# Patient Record
Sex: Male | Born: 1950
Health system: Southern US, Community
[De-identification: ages and names within clinical notes are randomized; demographics above are authoritative.]

## PROBLEM LIST (undated history)

## (undated) DIAGNOSIS — D320 Benign neoplasm of cerebral meninges: Secondary | ICD-10-CM

## (undated) DIAGNOSIS — I10 Essential (primary) hypertension: Secondary | ICD-10-CM

## (undated) DIAGNOSIS — E119 Type 2 diabetes mellitus without complications: Secondary | ICD-10-CM

## (undated) DIAGNOSIS — I639 Cerebral infarction, unspecified: Secondary | ICD-10-CM

## (undated) DIAGNOSIS — H409 Unspecified glaucoma: Secondary | ICD-10-CM

## (undated) DIAGNOSIS — E785 Hyperlipidemia, unspecified: Secondary | ICD-10-CM

## (undated) HISTORY — PX: EYE SURGERY: SHX253

---

## 2003-10-29 ENCOUNTER — Ambulatory Visit: Payer: Self-pay | Admitting: *Deleted

## 2004-01-26 ENCOUNTER — Ambulatory Visit: Payer: Self-pay | Admitting: Family Medicine

## 2004-04-12 ENCOUNTER — Ambulatory Visit: Payer: Self-pay | Admitting: Nurse Practitioner

## 2004-05-03 ENCOUNTER — Ambulatory Visit: Payer: Self-pay | Admitting: Nurse Practitioner

## 2004-06-21 ENCOUNTER — Ambulatory Visit: Payer: Self-pay | Admitting: Nurse Practitioner

## 2004-08-09 ENCOUNTER — Ambulatory Visit: Payer: Self-pay | Admitting: Nurse Practitioner

## 2004-09-13 ENCOUNTER — Ambulatory Visit: Payer: Self-pay | Admitting: Nurse Practitioner

## 2005-05-02 ENCOUNTER — Ambulatory Visit: Payer: Self-pay | Admitting: Nurse Practitioner

## 2005-09-05 ENCOUNTER — Encounter (INDEPENDENT_AMBULATORY_CARE_PROVIDER_SITE_OTHER): Payer: Self-pay | Admitting: Internal Medicine

## 2005-09-05 ENCOUNTER — Ambulatory Visit: Payer: Self-pay | Admitting: Internal Medicine

## 2005-09-05 LAB — CONVERTED CEMR LAB
ALT: 28 units/L
AST: 25 units/L
Albumin: 4.2 g/dL
Alkaline Phosphatase: 107 units/L
Anion Gap: 9
BUN: 16 mg/dL
CO2: 27 meq/L
Calcium: 9.3 mg/dL
Chloride: 106 meq/L
Creatinine, Ser: 1.1 mg/dL
Glucose, Bld: 113 mg/dL
Potassium: 3.8 meq/L
Sodium: 142 meq/L
Total Bilirubin: 0.4 mg/dL
Total Protein: 7.2 g/dL

## 2006-02-26 ENCOUNTER — Encounter (INDEPENDENT_AMBULATORY_CARE_PROVIDER_SITE_OTHER): Payer: Self-pay | Admitting: Internal Medicine

## 2006-02-26 DIAGNOSIS — H409 Unspecified glaucoma: Secondary | ICD-10-CM | POA: Insufficient documentation

## 2006-02-26 DIAGNOSIS — E785 Hyperlipidemia, unspecified: Secondary | ICD-10-CM | POA: Insufficient documentation

## 2006-02-26 DIAGNOSIS — I10 Essential (primary) hypertension: Secondary | ICD-10-CM | POA: Insufficient documentation

## 2011-05-18 ENCOUNTER — Emergency Department (HOSPITAL_COMMUNITY): Payer: No Typology Code available for payment source

## 2011-05-18 ENCOUNTER — Emergency Department (HOSPITAL_COMMUNITY)
Admission: EM | Admit: 2011-05-18 | Discharge: 2011-05-18 | Disposition: A | Discharge status: Home / Self Care | Payer: No Typology Code available for payment source | Attending: Emergency Medicine | Admitting: Emergency Medicine

## 2011-05-18 ENCOUNTER — Encounter (HOSPITAL_COMMUNITY): Payer: Self-pay | Admitting: Emergency Medicine

## 2011-05-18 DIAGNOSIS — S335XXA Sprain of ligaments of lumbar spine, initial encounter: Secondary | ICD-10-CM | POA: Insufficient documentation

## 2011-05-18 DIAGNOSIS — S161XXA Strain of muscle, fascia and tendon at neck level, initial encounter: Secondary | ICD-10-CM

## 2011-05-18 DIAGNOSIS — S39012A Strain of muscle, fascia and tendon of lower back, initial encounter: Secondary | ICD-10-CM

## 2011-05-18 DIAGNOSIS — E785 Hyperlipidemia, unspecified: Secondary | ICD-10-CM | POA: Insufficient documentation

## 2011-05-18 DIAGNOSIS — S139XXA Sprain of joints and ligaments of unspecified parts of neck, initial encounter: Secondary | ICD-10-CM | POA: Insufficient documentation

## 2011-05-18 DIAGNOSIS — M545 Low back pain, unspecified: Secondary | ICD-10-CM | POA: Insufficient documentation

## 2011-05-18 DIAGNOSIS — I10 Essential (primary) hypertension: Secondary | ICD-10-CM | POA: Insufficient documentation

## 2011-05-18 DIAGNOSIS — M542 Cervicalgia: Secondary | ICD-10-CM | POA: Insufficient documentation

## 2011-05-18 HISTORY — DX: Essential (primary) hypertension: I10

## 2011-05-18 HISTORY — DX: Hyperlipidemia, unspecified: E78.5

## 2011-05-18 MED ORDER — OXYCODONE-ACETAMINOPHEN 5-325 MG PO TABS
1.0000 | ORAL_TABLET | Freq: Once | ORAL | Status: AC
  Administered 2011-05-18: 1 via ORAL
  Filled 2011-05-18: qty 1

## 2011-05-18 MED ORDER — OXYCODONE-ACETAMINOPHEN 5-325 MG PO TABS: 1.0000 | ORAL_TABLET | ORAL | Status: AC | PRN

## 2011-05-18 MED ORDER — CYCLOBENZAPRINE HCL 10 MG PO TABS: 10.0000 mg | ORAL_TABLET | Freq: Two times a day (BID) | ORAL | Status: AC | PRN

## 2011-05-18 NOTE — ED Notes (Signed)
Patient transported to X-ray 
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
## 2011-05-18 NOTE — ED Provider Notes (Signed)
Medical screening examination/treatment/procedure(s) were performed by non-physician practitioner and as supervising physician I was immediately available for consultation/collaboration.  Jasper Riling. Alvino Chapel, MD 05/18/11 [phone]
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
## 2011-05-18 NOTE — Discharge Instructions (Signed)
Cervical Sprain A cervical sprain is an injury in the neck in which the ligaments are stretched or torn. The ligaments are the tissues that hold the bones of the neck (vertebrae) in place.Cervical sprains can range from very mild to very severe. Most cervical sprains get better in 1 to 3 weeks, but it depends on the cause and extent of the injury. Severe cervical sprains can cause the neck vertebrae to be unstable. This can lead to damage of the spinal cord and can result in serious nervous system problems. Your caregiver will determine whether your cervical sprain is mild or severe. CAUSES  Severe cervical sprains may be caused by:  Contact sport injuries (football, rugby, wrestling, hockey, auto racing, gymnastics, diving, martial arts, boxing).   Motor vehicle collisions.   Whiplash injuries. This means the neck is forcefully whipped backward and forward.   Falls.  Mild cervical sprains may be caused by:   Awkward positions, such as cradling a telephone between your ear and shoulder.   Sitting in a chair that does not offer proper support.   Working at a poorly Landscape architect station.   Activities that require looking up or down for long periods of time.  SYMPTOMS   Pain, soreness, stiffness, or a burning sensation in the front, back, or sides of the neck. This discomfort may develop immediately after injury or it may develop slowly and not begin for 24 hours or more after an injury.   Pain or tenderness directly in the middle of the back of the neck.   Shoulder or upper back pain.   Limited ability to move the neck.   Headache.   Dizziness.   Weakness, numbness, or tingling in the hands or arms.   Muscle spasms.   Difficulty swallowing or chewing.   Tenderness and swelling of the neck.  DIAGNOSIS  Most of the time, your caregiver can diagnose this problem by taking your history and doing a physical exam. Your caregiver will ask about any known problems, such as  arthritis in the neck or a previous neck injury. X-rays may be taken to find out if there are any other problems, such as problems with the bones of the neck. However, an X-ray often does not reveal the full extent of a cervical sprain. Other tests such as a computed tomography (CT) scan or magnetic resonance imaging (MRI) may be needed. TREATMENT  Treatment depends on the severity of the cervical sprain. Mild sprains can be treated with rest, keeping the neck in place (immobilization), and pain medicines. Severe cervical sprains need immediate immobilization and an appointment with an orthopedist or neurosurgeon. Several treatment options are available to help with pain, muscle spasms, and other symptoms. Your caregiver may prescribe:  Medicines, such as pain relievers, numbing medicines, or muscle relaxants.   Physical therapy. This can include stretching exercises, strengthening exercises, and posture training. Exercises and improved posture can help stabilize the neck, strengthen muscles, and help stop symptoms from returning.   A neck collar to be worn for short periods of time. Often, these collars are worn for comfort. However, certain collars may be worn to protect the neck and prevent further worsening of a serious cervical sprain.  HOME CARE INSTRUCTIONS   Put ice on the injured area.   Put ice in a plastic bag.   Place a towel between your skin and the bag.   Leave the ice on for 15 to 20 minutes, 3 to 4 times a day.  Only take over-the-counter or prescription medicines for pain, discomfort, or fever as directed by your caregiver.   Keep all follow-up appointments as directed by your caregiver.   Keep all physical therapy appointments as directed by your caregiver.   If a neck collar is prescribed, wear it as directed by your caregiver.   Do not drive while wearing a neck collar.   Make any needed adjustments to your work station to promote good posture.   Avoid positions  and activities that make your symptoms worse.   Warm up and stretch before being active to help prevent problems.  SEEK MEDICAL CARE IF:   Your pain is not controlled with medicine.   You are unable to decrease your pain medicine over time as planned.   Your activity level is not improving as expected.  SEEK IMMEDIATE MEDICAL CARE IF:   You develop any bleeding, stomach upset, or signs of an allergic reaction to your medicine.   Your symptoms get worse.   You develop new, unexplained symptoms.   You have numbness, tingling, weakness, or paralysis in any part of your body.  MAKE SURE YOU:   Understand these instructions.   Will watch your condition.   Will get help right away if you are not doing well or get worse.  Document Released: 12/03/2006 Document Revised: 01/25/2011 Document Reviewed: 11/08/2010 [facility] Patient Information 2012 [place].Lumbosacral Strain Lumbosacral strain is one of the most common causes of back pain. There are many causes of back pain. Most are not serious conditions. CAUSES  Your backbone (spinal column) is made up of 24 main vertebral bodies, the sacrum, and the coccyx. These are held together by muscles and tough, fibrous tissue (ligaments). Nerve roots pass through the openings between the vertebrae. A sudden move or injury to the back may cause injury to, or pressure on, these nerves. This may result in localized back pain or pain movement (radiation) into the buttocks, down the leg, and into the foot. Sharp, shooting pain from the buttock down the back of the leg (sciatica) is frequently associated with a ruptured (herniated) disk. Pain may be caused by muscle spasm alone. Your caregiver can often find the cause of your pain by the details of your symptoms and an exam. In some cases, you may need tests (such as X-rays). Your caregiver will work with you to decide if any tests are needed based on your specific exam. HOME CARE INSTRUCTIONS    Avoid an underactive lifestyle. Active exercise, as directed by your caregiver, is your greatest weapon against back pain.   Avoid hard physical activities (tennis, racquetball, waterskiing) if you are not in proper physical condition for it. This may aggravate or create problems.   If you have a back problem, avoid sports requiring sudden body movements. Swimming and walking are generally safer activities.   Maintain good posture.   Avoid becoming overweight (obese).   Use bed rest for only the most extreme, sudden (acute) episode. Your caregiver will help you determine how much bed rest is necessary.   For acute conditions, you may put ice on the injured area.   Put ice in a plastic bag.   Place a towel between your skin and the bag.   Leave the ice on for 15 to 20 minutes at a time, every 2 hours, or as needed.   After you are improved and more active, it may help to apply heat for 30 minutes before activities.  See your caregiver if  you are having pain that lasts longer than expected. Your caregiver can advise appropriate exercises or therapy if needed. With conditioning, most back problems can be avoided. SEEK IMMEDIATE MEDICAL CARE IF:   You have numbness, tingling, weakness, or problems with the use of your arms or legs.   You experience severe back pain not relieved with medicines.   There is a change in bowel or bladder control.   You have increasing pain in any area of the body, including your belly (abdomen).   You notice shortness of breath, dizziness, or feel faint.   You feel sick to your stomach (nauseous), are throwing up (vomiting), or become sweaty.   You notice discoloration of your toes or legs, or your feet get very cold.   Your back pain is getting worse.   You have a fever.  MAKE SURE YOU:   Understand these instructions.   Will watch your condition.   Will get help right away if you are not doing well or get worse.  Document Released:  11/15/2004 Document Revised: 01/25/2011 Document Reviewed: 05/07/2008 [facility] Patient Information 2012 [place].

## 2013-02-19 DIAGNOSIS — I639 Cerebral infarction, unspecified: Secondary | ICD-10-CM

## 2013-02-19 HISTORY — DX: Cerebral infarction, unspecified: I63.9

## 2013-12-06 ENCOUNTER — Encounter (HOSPITAL_COMMUNITY): Payer: Self-pay | Admitting: Emergency Medicine

## 2013-12-06 ENCOUNTER — Inpatient Hospital Stay (HOSPITAL_COMMUNITY)
Admission: EM | Admit: 2013-12-06 | Discharge: 2013-12-09 | DRG: 066 | Disposition: A | Discharge status: Home Health | Payer: Medicare HMO | Attending: Internal Medicine | Admitting: Internal Medicine

## 2013-12-06 ENCOUNTER — Emergency Department (HOSPITAL_COMMUNITY): Payer: Medicare HMO

## 2013-12-06 DIAGNOSIS — Z79899 Other long term (current) drug therapy: Secondary | ICD-10-CM

## 2013-12-06 DIAGNOSIS — Z6828 Body mass index (BMI) 28.0-28.9, adult: Secondary | ICD-10-CM

## 2013-12-06 DIAGNOSIS — I63532 Cerebral infarction due to unspecified occlusion or stenosis of left posterior cerebral artery: Secondary | ICD-10-CM | POA: Diagnosis not present

## 2013-12-06 DIAGNOSIS — Z823 Family history of stroke: Secondary | ICD-10-CM

## 2013-12-06 DIAGNOSIS — I6389 Other cerebral infarction: Secondary | ICD-10-CM | POA: Diagnosis present

## 2013-12-06 DIAGNOSIS — E785 Hyperlipidemia, unspecified: Secondary | ICD-10-CM | POA: Diagnosis present

## 2013-12-06 DIAGNOSIS — G459 Transient cerebral ischemic attack, unspecified: Secondary | ICD-10-CM

## 2013-12-06 DIAGNOSIS — R42 Dizziness and giddiness: Secondary | ICD-10-CM

## 2013-12-06 DIAGNOSIS — I10 Essential (primary) hypertension: Secondary | ICD-10-CM | POA: Diagnosis present

## 2013-12-06 DIAGNOSIS — Z7982 Long term (current) use of aspirin: Secondary | ICD-10-CM

## 2013-12-06 DIAGNOSIS — H409 Unspecified glaucoma: Secondary | ICD-10-CM | POA: Diagnosis present

## 2013-12-06 DIAGNOSIS — H538 Other visual disturbances: Secondary | ICD-10-CM | POA: Diagnosis present

## 2013-12-06 DIAGNOSIS — E669 Obesity, unspecified: Secondary | ICD-10-CM | POA: Diagnosis present

## 2013-12-06 DIAGNOSIS — E1149 Type 2 diabetes mellitus with other diabetic neurological complication: Secondary | ICD-10-CM | POA: Diagnosis present

## 2013-12-06 DIAGNOSIS — Z87891 Personal history of nicotine dependence: Secondary | ICD-10-CM

## 2013-12-06 HISTORY — DX: Unspecified glaucoma: H40.9

## 2013-12-06 HISTORY — DX: Type 2 diabetes mellitus without complications: E11.9

## 2013-12-06 LAB — PROTIME-INR
INR: 1.07 (ref 0.00–1.49)
Prothrombin Time: 14 seconds (ref 11.6–15.2)

## 2013-12-06 LAB — DIFFERENTIAL
Basophils Absolute: 0 10*3/uL (ref 0.0–0.1)
Basophils Relative: 0 % (ref 0–1)
Eosinophils Absolute: 0.2 10*3/uL (ref 0.0–0.7)
Eosinophils Relative: 4 % (ref 0–5)
Lymphocytes Relative: 36 % (ref 12–46)
Lymphs Abs: 1.8 10*3/uL (ref 0.7–4.0)
Monocytes Absolute: 0.4 10*3/uL (ref 0.1–1.0)
Monocytes Relative: 9 % (ref 3–12)
Neutro Abs: 2.5 10*3/uL (ref 1.7–7.7)
Neutrophils Relative %: 51 % (ref 43–77)

## 2013-12-06 LAB — COMPREHENSIVE METABOLIC PANEL
ALT: 14 U/L (ref 0–53)
AST: 17 U/L (ref 0–37)
Albumin: 3.9 g/dL (ref 3.5–5.2)
Alkaline Phosphatase: 108 U/L (ref 39–117)
Anion gap: 11 (ref 5–15)
BUN: 24 mg/dL — ABNORMAL HIGH (ref 6–23)
CO2: 27 mEq/L (ref 19–32)
Calcium: 9.5 mg/dL (ref 8.4–10.5)
Chloride: 103 mEq/L (ref 96–112)
Creatinine, Ser: 1.59 mg/dL — ABNORMAL HIGH (ref 0.50–1.35)
GFR calc Af Amer: 51 mL/min — ABNORMAL LOW (ref >90)
GFR calc non Af Amer: 44 mL/min — ABNORMAL LOW (ref >90)
Glucose, Bld: 117 mg/dL — ABNORMAL HIGH (ref 70–99)
Potassium: 4.3 mEq/L (ref 3.7–5.3)
Sodium: 141 mEq/L (ref 137–147)
Total Bilirubin: 0.2 mg/dL — ABNORMAL LOW (ref 0.3–1.2)
Total Protein: 7.7 g/dL (ref 6.0–8.3)

## 2013-12-06 LAB — I-STAT TROPONIN, ED: Troponin i, poc: 0.01 ng/mL (ref 0.00–0.08)

## 2013-12-06 LAB — CBC
HCT: 40.8 % (ref 39.0–52.0)
Hemoglobin: 13.5 g/dL (ref 13.0–17.0)
MCH: 30.3 pg (ref 26.0–34.0)
MCHC: 33.1 g/dL (ref 30.0–36.0)
MCV: 91.7 fL (ref 78.0–100.0)
Platelets: 219 10*3/uL (ref 150–400)
RBC: 4.45 MIL/uL (ref 4.22–5.81)
RDW: 12.8 % (ref 11.5–15.5)
WBC: 4.9 10*3/uL (ref 4.0–10.5)

## 2013-12-06 LAB — APTT: aPTT: 29 seconds (ref 24–37)

## 2013-12-06 LAB — CBG MONITORING, ED: Glucose-Capillary: 104 mg/dL — ABNORMAL HIGH (ref 70–99)

## 2013-12-06 MED ORDER — ACETAMINOPHEN 325 MG PO TABS
650.0000 mg | ORAL_TABLET | Freq: Once | ORAL | Status: AC
  Administered 2013-12-06: 650 mg via ORAL
  Filled 2013-12-06: qty 2

## 2013-12-06 MED ORDER — SODIUM CHLORIDE 0.9 % IV BOLUS (SEPSIS)
1000.0000 mL | INTRAVENOUS | Status: AC
  Administered 2013-12-06: 1000 mL via INTRAVENOUS

## 2013-12-06 NOTE — ED Provider Notes (Signed)
CSN: [id number]     Arrival date & time 12/06/13  2053 History   First MD Initiated Contact with Patient 12/06/13 2207     Chief Complaint  Patient presents with  . Dizziness  . Blurred Vision     (Consider location/radiation/quality/duration/timing/severity/associated sxs/prior Treatment) Patient is a 63 y.o. male presenting with dizziness. The history is provided by the patient.  Dizziness Quality:  Imbalance Severity:  Mild Onset quality:  Sudden Duration:  12 hours Timing:  Constant Progression:  Unchanged Chronicity:  New Context comment:  Spontaneously while at rest Relieved by:  Nothing Worsened by:  Nothing tried Ineffective treatments:  None tried Associated symptoms: no chest pain, no diarrhea, no headaches, no nausea, no shortness of breath and no vomiting     Past Medical History  Diagnosis Date  . Hypertension   . Hyperlipidemia   . Diabetes mellitus without complication   . Glaucoma    Past Surgical History  Procedure Laterality Date  . Eye surgery     No family history on file. History  Substance Use Topics  . Smoking status: Never Smoker   . Smokeless tobacco: Not on file  . Alcohol Use: No    Review of Systems  Constitutional: Negative for fever.  HENT: Negative for drooling and rhinorrhea.   Eyes: Negative for pain.  Respiratory: Negative for cough and shortness of breath.   Cardiovascular: Negative for chest pain and leg swelling.  Gastrointestinal: Negative for nausea, vomiting, abdominal pain and diarrhea.  Genitourinary: Negative for dysuria and hematuria.  Musculoskeletal: Negative for gait problem and neck pain.  Skin: Negative for color change.  Neurological: Positive for dizziness. Negative for numbness and headaches.  Hematological: Negative for adenopathy.  Psychiatric/Behavioral: Negative for behavioral problems.  All other systems reviewed and are negative.     Allergies  Review of patient's allergies indicates no known  allergies.  Home Medications   Prior to Admission medications   Medication Sig Start Date End Date Taking? Authorizing Provider  aspirin 325 MG tablet Take 325 mg by mouth daily as needed. For pain    Historical Provider, MD  bimatoprost (LUMIGAN) 0.03 % ophthalmic solution Place 1 drop into both eyes 2 (two) times daily.    Historical Provider, MD  brimonidine (ALPHAGAN) 0.2 % ophthalmic solution Place 1 drop into both eyes 3 (three) times daily.    Historical Provider, MD  hydrochlorothiazide (HYDRODIURIL) 25 MG tablet Take 25 mg by mouth daily.    Historical Provider, MD  lovastatin (MEVACOR) 20 MG tablet Take 20 mg by mouth at bedtime.    Historical Provider, MD   BP 117/73  Pulse 65  Temp(Src) 98.4 F (36.9 C) (Oral)  Resp 24  Ht 5\' 4"  (1.626 m)  Wt 168 lb (76.204 kg)  BMI 28.82 kg/m2  SpO2 97% Physical Exam  Nursing note and vitals reviewed. Constitutional: He is oriented to person, place, and time. He appears well-developed and well-nourished.  HENT:  Head: Normocephalic and atraumatic.  Right Ear: External ear normal.  Left Ear: External ear normal.  Nose: Nose normal.  Mouth/Throat: Oropharynx is clear and moist. No oropharyngeal exudate.  20/40 vision bilaterally.   Eyes: Conjunctivae and EOM are normal. Pupils are equal, round, and reactive to light.  Neck: Normal range of motion. Neck supple.  Cardiovascular: Normal rate, regular rhythm, normal heart sounds and intact distal pulses.  Exam reveals no gallop and no friction rub.   No murmur heard. Pulmonary/Chest: Effort normal and breath sounds normal.  No respiratory distress. He has no wheezes.  Abdominal: Soft. Bowel sounds are normal. He exhibits no distension. There is no tenderness. There is no rebound and no guarding.  Musculoskeletal: Normal range of motion. He exhibits no edema and no tenderness.  Neurological: He is alert and oriented to person, place, and time.  alert, oriented x3 speech: normal in context  and clarity memory: intact grossly cranial nerves II-XII: intact motor strength: full proximally and distally no involuntary movements or tremors sensation: intact to light touch diffusely  cerebellar: finger-to-nose and heel-to-shin intact gait: mild ataxia but able to ambulate forwards and backwards   Skin: Skin is warm and dry.  Psychiatric: He has a normal mood and affect. His behavior is normal.    ED Course  Procedures (including critical care time) Labs Review Labs Reviewed  COMPREHENSIVE METABOLIC PANEL - Abnormal; Notable for the following:    Glucose, Bld 117 (*)    BUN 24 (*)    Creatinine, Ser 1.59 (*)    Total Bilirubin 0.2 (*)    GFR calc non Af Amer 44 (*)    GFR calc Af Amer 51 (*)    All other components within normal limits  CBG MONITORING, ED - Abnormal; Notable for the following:    Glucose-Capillary 104 (*)    All other components within normal limits  CBC  DIFFERENTIAL  PROTIME-INR  APTT  URINALYSIS, ROUTINE W REFLEX MICROSCOPIC  I-STAT TROPOININ, ED    Imaging Review Ct Head (brain) Wo Contrast  12/06/2013   CLINICAL DATA:  Dizziness and blurry vision beginning today at 10:30 a.m. mild headache.  EXAM: CT HEAD WITHOUT CONTRAST  TECHNIQUE: Contiguous axial images were obtained from the base of the skull through the vertex without intravenous contrast.  COMPARISON:  None.  FINDINGS: The ventricles and sulci are normal for age. No intraparenchymal hemorrhage, mass effect nor midline shift. Patchy supratentorial white matter hypodensities are greater than expected for patient's age and though non-specific suggest sequelae of chronic small vessel ischemic disease. No acute large vascular territory infarcts. 10 mm left cerebellar calcification. Mild calcific atherosclerosis with carotid siphons.  No abnormal extra-axial fluid collections. Basal cisterns are patent. Mild calcific atherosclerosis of the carotid siphons.  No skull fracture. The included ocular  globes and orbital contents are non-suspicious. The mastoid aircells and included paranasal sinuses are well-aerated.  IMPRESSION: No acute intracranial process; however, moderate white matter changes, advanced for age, may obscure acute ischemia. If clinically indicated, MRI of the brain with diffusion-weighted sequences would be more sensitive.  LEFT occipital encephalomalacia suggest remote ischemic or traumatic etiology.  10 mm LEFT cerebellar calcification can be seen with cavernoma.   Electronically Signed   By: Elon Alas   On: 12/06/2013 22:16     EKG Interpretation   Date/Time:  Sunday December 06 2013 22:28:52 EDT Ventricular Rate:  65 PR Interval:  137 QRS Duration: 75 QT Interval:  380 QTC Calculation: 395 R Axis:   49 Text Interpretation:  Sinus rhythm Minimal ST elevation, anterior leads no  previous for comparison Confirmed by Jatavian Calica  MD, Ota Ebersole ([id number]) on  12/06/2013 11:11:40 PM      MDM   Final diagnoses:  Dizziness    11:02 PM 63 y.o. male w hx of HTN, DM, HLP, glaucoma who presents with dizziness and blurred vision which began suddenly this morning around 9 AM. He states that his symptoms have persisted throughout the day and he feels mildly off balance when ambulating. He is  afebrile and vital signs are unremarkable here. He is mildly ataxic on exam. Otherwise normal neurologic exam. Will get labs and imaging.  CT suspicious. Pt's sx also suspicious for CVA given mild ataxia. Will admit to hospitalist.    Pamella Pert, MD 12/07/13 [phone]

## 2013-12-06 NOTE — H&P (Signed)
PCP: [facility]    Chief Complaint:  Blurred vision, vertigo  HPI: James Henderson is a 63 y.o. male   has a past medical history of Hypertension; Hyperlipidemia; Diabetes mellitus without complication; and Glaucoma.   Presented with  10:30 AM he started to feel off balance and had some blurred vision.  The blurred vision has resolved since but vertigo while ambulating has persisted. No change in hearing. Denies any chest pain no shortness of breath. Patient have noted to have slightly increased Cr up to 1.59 he was told in the past that he has mild kidney issues.  CT head showed:  No acute intracranial process; however, moderate white matter changes, advanced for age, may obscure acute ischemia. Hospitalist was called for admission for vertigo with abnormal CT findings evaluate for TA/CVA  Review of Systems:    Pertinent positives include:  Headaches, vertigo, blurred vision  Constitutional:  No weight loss, night sweats, Fevers, chills, fatigue, weight loss  HEENT:  No  Difficulty swallowing,Tooth/dental problems,Sore throat,  No sneezing, itching, ear ache, nasal congestion, post nasal drip,  Cardio-vascular:  No chest pain, Orthopnea, PND, anasarca, dizziness, palpitations.no Bilateral lower extremity swelling  GI:  No heartburn, indigestion, abdominal pain, nausea, vomiting, diarrhea, change in bowel habits, loss of appetite, melena, blood in stool, hematemesis Resp:  no shortness of breath at rest. No dyspnea on exertion, No excess mucus, no productive cough, No non-productive cough, No coughing up of blood.No change in color of mucus.No wheezing. Skin:  no rash or lesions. No jaundice GU:  no dysuria, change in color of urine, no urgency or frequency. No straining to urinate.  No flank pain.  Musculoskeletal:  No joint pain or no joint swelling. No decreased range of motion. No back pain.  Psych:  No change in mood or affect. No depression or anxiety. No memory  loss.  Neuro: no localizing neurological complaints, no tingling, no weakness, no double vision, no gait abnormality, no slurred speech, no confusion  Otherwise ROS are negative except for above, 10 systems were reviewed  Past Medical History: Past Medical History  Diagnosis Date  . Hypertension   . Hyperlipidemia   . Diabetes mellitus without complication   . Glaucoma    Past Surgical History  Procedure Laterality Date  . Eye surgery       Medications: Prior to Admission medications   Medication Sig Start Date End Date Taking? Authorizing Provider  aspirin EC 81 MG tablet Take 81 mg by mouth daily.   Yes Historical Provider, MD  Brinzolamide-Brimonidine [facility]) 1-0.2 % SUSP Place 1 drop into both eyes 3 (three) times daily.   Yes Historical Provider, MD  Cholecalciferol (VITAMIN D-3) 5000 UNITS TABS Take 5,000 Units by mouth daily.   Yes Historical Provider, MD  fluticasone (FLONASE) 50 MCG/ACT nasal spray Place 1 spray into both nostrils daily as needed for allergies or rhinitis.   Yes Historical Provider, MD  hydrochlorothiazide (HYDRODIURIL) 25 MG tablet Take 12.5 mg by mouth daily.    Yes Historical Provider, MD  lovastatin (MEVACOR) 20 MG tablet Take 20 mg by mouth at bedtime.   Yes Historical Provider, MD  metFORMIN (GLUMETZA) 500 MG (MOD) 24 hr tablet Take 500 mg by mouth daily with breakfast.   Yes Historical Provider, MD  niacin 500 MG tablet Take 500 mg by mouth 2 (two) times daily with a meal.   Yes Historical Provider, MD  Omega-3 Fatty Acids (FISH OIL) 1200 MG CAPS Take 1 capsule  by mouth 2 (two) times daily.   Yes Historical Provider, MD  Polyethyl Glycol-Propyl Glycol (SYSTANE ULTRA) 0.4-0.3 % SOLN Place 1 drop into both eyes daily as needed (dryness or itching).   Yes Historical Provider, MD  timolol (BETIMOL) 0.5 % ophthalmic solution Place 1 drop into both eyes every morning.   Yes Historical Provider, MD  travoprost, benzalkonium, (TRAVATAN) 0.004 % ophthalmic  solution Place 1 drop into both eyes at bedtime.   Yes Historical Provider, MD    Allergies:  No Known Allergies  Social History:  Ambulatory  independently   Lives at home With family     reports that he has quit smoking. He does not have any smokeless tobacco history on file. He reports that he does not drink alcohol or use illicit drugs.    Family History: family history includes Diabetes type II in his brother; Stroke in his brother and mother.    Physical Exam: Patient Vitals for the past 24 hrs:  BP Temp Temp src Pulse Resp SpO2 Height Weight  12/06/13 2330 121/65 mmHg - - 64 18 98 % - -  12/06/13 2315 110/70 mmHg - - 59 18 98 % - -  12/06/13 2302 - 98.7 F (37.1 C) - - - - - -  12/06/13 2300 117/73 mmHg - - 65 24 97 % - -  12/06/13 2246 117/78 mmHg - - 64 22 99 % - -  12/06/13 2245 117/78 mmHg - - 65 16 97 % - -  12/06/13 2231 121/78 mmHg 98.4 F (36.9 C) Oral 64 - 98 % - -  12/06/13 2230 126/71 mmHg - - 69 17 97 % - -  12/06/13 2139 129/78 mmHg 98.1 F (36.7 C) Oral 69 18 97 % 5\' 4"  (1.626 m) 76.204 kg (168 lb)    1. General:  in No Acute distress 2. Psychological: Alert and   Oriented 3. Head/ENT:   Moist Mucous Membranes                          Head Non traumatic, neck supple                          Normal  Dentition 4. SKIN: normal  Skin turgor,  Skin clean Dry and intact no rash 5. Heart: Regular rate and rhythm no Murmur, Rub or gallop 6. Lungs: Clear to auscultation bilaterally, no wheezes or crackles   7. Abdomen: Soft, non-tender, Non distended 8. Lower extremities: no clubbing, cyanosis, or edema 9. Neurologically strength 5/5 in all 4 ext, CN intact except left facial droop, no pronator drift 10. MSK: Normal range of motion  body mass index is 28.82 kg/(m^2).   Labs on Admission:   Recent Labs  12/06/13 2220  NA 141  K 4.3  CL 103  CO2 27  GLUCOSE 117*  BUN 24*  CREATININE 1.59*  CALCIUM 9.5    Recent Labs  12/06/13 2220  AST  17  ALT 14  ALKPHOS 108  BILITOT 0.2*  PROT 7.7  ALBUMIN 3.9   No results found for this basename: LIPASE, AMYLASE,  in the last 72 hours  Recent Labs  12/06/13 2220  WBC 4.9  NEUTROABS 2.5  HGB 13.5  HCT 40.8  MCV 91.7  PLT 219   No results found for this basename: CKTOTAL, CKMB, CKMBINDEX, TROPONINI,  in the last 72 hours No results found for this basename: TSH,  T4TOTAL, FREET3, T3FREE, THYROIDAB,  in the last 72 hours No results found for this basename: VITAMINB12, FOLATE, FERRITIN, TIBC, IRON, RETICCTPCT,  in the last 72 hours No results found for this basename: HGBA1C    Estimated Creatinine Clearance: 43.2 ml/min (by C-G formula based on Cr of 1.59). ABG No results found for this basename: phart, pco2, po2, hco3, tco2, acidbasedef, o2sat     No results found for this basename: DDIMER     Other results:  I have pearsonaly reviewed this: ECG REPORT  Rate:65   Rhythm: NSR ST&T Change: ECG non specific findings anterior leads  BNP (last 3 results) No results found for this basename: PROBNP,  in the last 8760 hours  Filed Weights   12/06/13 2139  Weight: 76.204 kg (168 lb)    Cultures: No results found for this basename: sdes, specrequest, cult, reptstatus    Radiological Exams on Admission: Ct Head (brain) Wo Contrast  12/06/2013   CLINICAL DATA:  Dizziness and blurry vision beginning today at 10:30 a.m. mild headache.  EXAM: CT HEAD WITHOUT CONTRAST  TECHNIQUE: Contiguous axial images were obtained from the base of the skull through the vertex without intravenous contrast.  COMPARISON:  None.  FINDINGS: The ventricles and sulci are normal for age. No intraparenchymal hemorrhage, mass effect nor midline shift. Patchy supratentorial white matter hypodensities are greater than expected for patient's age and though non-specific suggest sequelae of chronic small vessel ischemic disease. No acute large vascular territory infarcts. 10 mm left cerebellar  calcification. Mild calcific atherosclerosis with carotid siphons.  No abnormal extra-axial fluid collections. Basal cisterns are patent. Mild calcific atherosclerosis of the carotid siphons.  No skull fracture. The included ocular globes and orbital contents are non-suspicious. The mastoid aircells and included paranasal sinuses are well-aerated.  IMPRESSION: No acute intracranial process; however, moderate white matter changes, advanced for age, may obscure acute ischemia. If clinically indicated, MRI of the brain with diffusion-weighted sequences would be more sensitive.  LEFT occipital encephalomalacia suggest remote ischemic or traumatic etiology.  10 mm LEFT cerebellar calcification can be seen with cavernoma.   Electronically Signed   By: Elon Alas   On: 12/06/2013 22:16    Chart has been reviewed  Assessment/Plan  63 yo M w past hx of DM, HTN, HL here with vertigo and blurred vision, with some persistent vertigo being evaluated for TIA/CVA  Present on Admission:  . TIA (transient ischemic attack)  - will admit based on TIA/CVA protocol, await results of MRA/MRI, Carotid Doppler and Echo, obtain cardiac enzymes,  ECG,    Lipid panel, TSH. Order PT/OT evaluation. Aspirin 325. IF abnormal MRI  Neurology consult in AM.     . Essential hypertension hold HCTZ given soft BP . DM (diabetes mellitus) type II controlled, neurological manifestation - hold metformin, SSI    Prophylaxis: SCD   CODE STATUS:  FULL CODE     Other plan as per orders.  I have spent a total of  55 min on this admission  James Henderson 12/06/2013, 11:53 PM  [facility]  Pager [phone]   after 2 AM please page floor coverage PA If 7AM-7PM, please contact the day team taking care of the patient  [email or web address]  Password [email or web address]

## 2013-12-06 NOTE — ED Notes (Signed)
C/o dizziness and blurred vision since 10:30am.  States he stayed home from church because he wasn't able to drive due to dizziness.  Reports CBG 212 at 11am.  CBG just pta 106- states he still has dizziness and blurred vision. Intermittent mild headache today.

## 2013-12-07 ENCOUNTER — Observation Stay (HOSPITAL_COMMUNITY): Payer: Medicare HMO

## 2013-12-07 ENCOUNTER — Encounter (HOSPITAL_COMMUNITY): Payer: Self-pay | Admitting: *Deleted

## 2013-12-07 DIAGNOSIS — I6389 Other cerebral infarction: Secondary | ICD-10-CM | POA: Diagnosis present

## 2013-12-07 DIAGNOSIS — R42 Dizziness and giddiness: Secondary | ICD-10-CM

## 2013-12-07 DIAGNOSIS — I517 Cardiomegaly: Secondary | ICD-10-CM

## 2013-12-07 LAB — HEMOGLOBIN A1C
Hgb A1c MFr Bld: 7.3 % — ABNORMAL HIGH (ref <5.7)
Mean Plasma Glucose: 163 mg/dL — ABNORMAL HIGH (ref <117)

## 2013-12-07 LAB — GLUCOSE, CAPILLARY
Glucose-Capillary: 107 mg/dL — ABNORMAL HIGH (ref 70–99)
Glucose-Capillary: 113 mg/dL — ABNORMAL HIGH (ref 70–99)
Glucose-Capillary: 121 mg/dL — ABNORMAL HIGH (ref 70–99)
Glucose-Capillary: 98 mg/dL (ref 70–99)
Glucose-Capillary: 135 mg/dL — ABNORMAL HIGH (ref 70–99)

## 2013-12-07 LAB — LIPID PANEL
Cholesterol: 170 mg/dL (ref 0–200)
HDL: 33 mg/dL — ABNORMAL LOW (ref >39)
LDL Cholesterol: 100 mg/dL — ABNORMAL HIGH (ref 0–99)
Total CHOL/HDL Ratio: 5.2 RATIO
Triglycerides: 185 mg/dL — ABNORMAL HIGH (ref <150)
VLDL: 37 mg/dL (ref 0–40)

## 2013-12-07 LAB — TROPONIN I
Troponin I: <0.3 ng/mL (ref <0.30)
Troponin I: <0.3 ng/mL (ref <0.30)
Troponin I: <0.3 ng/mL (ref <0.30)

## 2013-12-07 LAB — URINALYSIS, ROUTINE W REFLEX MICROSCOPIC
Bilirubin Urine: NEGATIVE
Glucose, UA: NEGATIVE mg/dL
Hgb urine dipstick: NEGATIVE
Ketones, ur: NEGATIVE mg/dL
Leukocytes, UA: NEGATIVE
Nitrite: NEGATIVE
Protein, ur: NEGATIVE mg/dL
Specific Gravity, Urine: 1.018 (ref 1.005–1.030)
Urobilinogen, UA: 0.2 mg/dL (ref 0.0–1.0)
pH: 6.5 (ref 5.0–8.0)

## 2013-12-07 LAB — SODIUM, URINE, RANDOM: Sodium, Ur: 145 mEq/L

## 2013-12-07 LAB — CREATININE, URINE, RANDOM: Creatinine, Urine: 144.58 mg/dL

## 2013-12-07 MED ORDER — INFLUENZA VAC SPLIT QUAD 0.5 ML IM SUSY
0.5000 mL | PREFILLED_SYRINGE | INTRAMUSCULAR | Status: DC
  Filled 2013-12-07: qty 0.5

## 2013-12-07 MED ORDER — ASPIRIN 325 MG PO TABS
325.0000 mg | ORAL_TABLET | Freq: Every day | ORAL | Status: DC
  Administered 2013-12-07: 325 mg via ORAL
  Filled 2013-12-07: qty 1

## 2013-12-07 MED ORDER — SODIUM CHLORIDE 0.9 % IV SOLN
INTRAVENOUS | Status: AC
  Administered 2013-12-07: 02:00:00 via INTRAVENOUS

## 2013-12-07 MED ORDER — INSULIN ASPART 100 UNIT/ML ~~LOC~~ SOLN
0.0000 [IU] | SUBCUTANEOUS | Status: DC
  Administered 2013-12-07 – 2013-12-08 (×3): 1 [IU] via SUBCUTANEOUS
  Administered 2013-12-08: 2 [IU] via SUBCUTANEOUS
  Administered 2013-12-09 (×4): 1 [IU] via SUBCUTANEOUS

## 2013-12-07 MED ORDER — PRAVASTATIN SODIUM 20 MG PO TABS
20.0000 mg | ORAL_TABLET | Freq: Every day | ORAL | Status: DC
  Administered 2013-12-07 – 2013-12-08 (×2): 20 mg via ORAL
  Filled 2013-12-07 (×6): qty 1

## 2013-12-07 MED ORDER — TRAVOPROST (BAK FREE) 0.004 % OP SOLN
1.0000 [drp] | Freq: Every day | OPHTHALMIC | Status: DC
  Administered 2013-12-07 – 2013-12-08 (×2): 1 [drp] via OPHTHALMIC
  Filled 2013-12-07: qty 2.5

## 2013-12-07 MED ORDER — PNEUMOCOCCAL VAC POLYVALENT 25 MCG/0.5ML IJ INJ
0.5000 mL | INJECTION | INTRAMUSCULAR | Status: DC
  Filled 2013-12-07: qty 0.5

## 2013-12-07 MED ORDER — TIMOLOL HEMIHYDRATE 0.5 % OP SOLN: 1.0000 [drp] | OPHTHALMIC | Status: DC

## 2013-12-07 MED ORDER — ACETAMINOPHEN 325 MG PO TABS: 650.0000 mg | ORAL_TABLET | ORAL | Status: DC | PRN

## 2013-12-07 MED ORDER — BRINZOLAMIDE 1 % OP SUSP
1.0000 [drp] | Freq: Three times a day (TID) | OPHTHALMIC | Status: DC
  Administered 2013-12-07 – 2013-12-09 (×6): 1 [drp] via OPHTHALMIC
  Filled 2013-12-07: qty 10

## 2013-12-07 MED ORDER — NIACIN 500 MG PO TABS
500.0000 mg | ORAL_TABLET | Freq: Two times a day (BID) | ORAL | Status: DC
  Administered 2013-12-07 – 2013-12-09 (×5): 500 mg via ORAL
  Filled 2013-12-07 (×9): qty 1

## 2013-12-07 MED ORDER — TIMOLOL MALEATE 0.5 % OP SOLN
1.0000 [drp] | Freq: Every day | OPHTHALMIC | Status: DC
  Administered 2013-12-07 – 2013-12-09 (×3): 1 [drp] via OPHTHALMIC
  Filled 2013-12-07: qty 5

## 2013-12-07 MED ORDER — STROKE: EARLY STAGES OF RECOVERY BOOK
Freq: Once | Status: AC
  Administered 2013-12-07: 02:00:00
  Filled 2013-12-07: qty 1

## 2013-12-07 MED ORDER — HEPARIN SODIUM (PORCINE) 5000 UNIT/ML IJ SOLN
5000.0000 [IU] | Freq: Three times a day (TID) | INTRAMUSCULAR | Status: DC
  Administered 2013-12-07 – 2013-12-08 (×3): 5000 [IU] via SUBCUTANEOUS
  Filled 2013-12-07 (×3): qty 1

## 2013-12-07 MED ORDER — BRIMONIDINE TARTRATE 0.2 % OP SOLN
1.0000 [drp] | Freq: Three times a day (TID) | OPHTHALMIC | Status: DC
  Administered 2013-12-07 – 2013-12-08 (×4): 1 [drp] via OPHTHALMIC
  Filled 2013-12-07 (×2): qty 5

## 2013-12-07 NOTE — Progress Notes (Signed)
Echo Lab  2D Echocardiogram completed.  Cloud, RDCS 12/07/2013 10:32 AM

## 2013-12-07 NOTE — Progress Notes (Signed)
UR completed 

## 2013-12-07 NOTE — Evaluation (Signed)
Physical Therapy Evaluation Patient Details Name: James Henderson MRN: [id number] DOB: [birth year] Today's Date: 12/07/2013   History of Present Illness  Adm 12/06/13 due to blurry vision and imbalance (pt denies vertigo symptoms occurred). CT head inconclusive re: ?CVA and awaiting MRI results. PMHx- DM, glaucoma, HTN   Clinical Impression  Pt admitted with imbalance and blurry vision. Head CT inconclusive and MRI pending. Pt denies sense of vertigo, however demonstrates Rt lateral lean/weight shift in standing and walking requiring 1 person assist to prevent falling. Appeared to have dysconjugate gaze with ?Rt eye using corrective saccades. Pt reporting Rt eye vision is blurry compared to normal. Pt currently with functional limitations due to the deficits listed below (see PT Problem List). Pt will benefit from skilled PT to increase their independence and safety with mobility to allow discharge to the venue listed below.       Follow Up Recommendations CIR;Supervision/Assistance - 24 hour    Equipment Recommendations  Rolling walker with 5" wheels (TBA)    Recommendations for Other Services OT consult     Precautions / Restrictions Precautions Precautions: Fall      Mobility  Bed Mobility               General bed mobility comments: up in chair; NT  Transfers Overall transfer level: Needs assistance Equipment used: None Transfers: Sit to/from Stand Sit to Stand: Min guard         General transfer comment: pt with Rt lean; did not lose balance  Ambulation/Gait Ambulation/Gait assistance: Min assist Ambulation Distance (Feet): 150 Feet Assistive device: None (vs IV pole) Gait Pattern/deviations: Decreased stride length;Wide base of support;Drifts right/left;Decreased weight shift to left Gait velocity: decr; able to incr with cues   General Gait Details: Pt with noted weight shift to his Rt (Rt shoulder lower; decr weight shift to his Lt); drifted to his Rt with  head turn to the Rt  Stairs            Wheelchair Mobility    Modified Rankin (Stroke Patients Only) Modified Rankin (Stroke Patients Only) Pre-Morbid Rankin Score: No symptoms Modified Rankin: Moderately severe disability     Balance Overall balance assessment: Needs assistance Sitting-balance support: No upper extremity supported;Feet supported Sitting balance-Leahy Scale: Good     Standing balance support: No upper extremity supported Standing balance-Leahy Scale: Fair       Tandem Stance - Right Leg: 0 (falls to his Rt (steps to regain balance)) Tandem Stance - Left Leg: 0 (falls to his Rt (steps to regain balance)) Rhomberg - Eyes Opened: 0 (unable to attain position with drift to his Rt)                   Pertinent Vitals/Pain BP with minimal elevation with ambulation (post walking 115/72)  Pain Assessment: No/denies pain    Home Living Family/patient expects to be discharged to:: Private residence Living Arrangements: Spouse/significant other;Children (foster children: around 35 yrs old) Available Help at Discharge: Family;Available 24 hours/day Type of Home: House Home Access: Level entry     Home Layout: One level Home Equipment: None      Prior Function Level of Independence: Independent         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, newspaper        Extremity/Trunk Assessment   Upper Extremity Assessment: Defer to OT evaluation           Lower Extremity Assessment: Overall [facility]  for tasks assessed (strength 5/5; denies sensation changes)      Cervical / Trunk Assessment: Normal  Communication   Communication: No difficulties  Cognition Arousal/Alertness: Awake/alert Behavior During Therapy: WFL for tasks assessed/performed Overall Cognitive Status: Within Functional Limits for tasks assessed                      General Comments General comments (skin integrity, edema, etc.): Denies  double-vision, however appeared to have dysconjugate gaze at times (?Rt eye lagging briefly and then corrective saccade). States vision in his Rt eye is blurry compared to usual.     Exercises        Assessment/Plan    PT Assessment Patient needs continued PT services  PT Diagnosis Difficulty walking   PT Problem List Decreased balance;Decreased mobility;Decreased knowledge of use of DME  PT Treatment Interventions DME instruction;Gait training;Functional mobility training;Therapeutic activities;Balance training;Neuromuscular re-education;Patient/family education   PT Goals (Current goals can be found in the Care Plan section) Acute Rehab PT Goals Patient Stated Goal: regain his balance; return to work at school PT Goal Formulation: With patient Time For Goal Achievement: 12/14/13 Potential to Achieve Goals: Good    Frequency Min 4X/week   Barriers to discharge        Co-evaluation               End of Session Equipment Utilized During Treatment: Gait belt Activity Tolerance: Patient tolerated treatment well Patient left: in chair;with call bell/phone within reach;with chair alarm set;with family/visitor present Nurse Communication: Mobility status    Functional Assessment Tool Used: clinical judgement Functional Limitation: Mobility: Walking and moving around Mobility: Walking and Moving Around Current Status [phone]): At least 20 percent but less than 40 percent impaired, limited or restricted Mobility: Walking and Moving Around Goal Status [phone]): At least 1 percent but less than 20 percent impaired, limited or restricted    Time: 1340-1406 PT Time Calculation (min): 26 min   Charges:   PT Evaluation $Initial PT Evaluation Tier I: 1 Procedure PT Treatments $Gait Training: 8-22 mins   PT G Codes:   Functional Assessment Tool Used: clinical judgement Functional Limitation: Mobility: Walking and moving around    Tensley Wery 12/07/2013, 2:24 PM Pager  [phone]

## 2013-12-07 NOTE — Progress Notes (Signed)
VASCULAR LAB PRELIMINARY  PRELIMINARY  PRELIMINARY  PRELIMINARY  Carotid Dopplers completed.    Preliminary report:  1-39% ICA stenosis.  Vertebral artery flow is antegrade.   Nikoletta Varma, RVT 12/07/2013, 11:21 AM

## 2013-12-07 NOTE — Evaluation (Addendum)
Occupational Therapy Evaluation Patient Details Name: James Henderson MRN: [id number] DOB: [birth year] Today's Date: 12/07/2013    History of Present Illness Adm 12/06/13 due to blurry vision and imbalance (pt denies vertigo symptoms occurred). CT head inconclusive re: ?CVA and awaiting MRI results. PMHx- DM, glaucoma, HTN   Clinical Impression   Pt s/p above. Pt independent with ADLs, PTA. Feel pt will benefit from acute OT to increase independence prior to d/c.     Follow Up Recommendations  CIR    Equipment Recommendations  None recommended by OT    Recommendations for Other Services       Precautions / Restrictions Precautions Precautions: Fall Restrictions Weight Bearing Restrictions: No      Mobility Bed Mobility Overal bed mobility: Modified Independent              Transfers Overall transfer level: Needs assistance Equipment used: None Transfers: Sit to/from Stand Sit to Stand: Min guard;Min assist         General transfer comment: after standing for a little while, pt with slight LOB.         ADL Overall ADL's : Needs assistance/impaired     Grooming: Oral care;Wash/dry face;Min guard;Standing   Upper Body Bathing: Set up;Supervision/ safety;Standing   Lower Body Bathing: Set up;Supervison/ safety;Sit to/from stand   Upper Body Dressing : Set up;Supervision/safety;Standing   Lower Body Dressing: Min guard;Sit to/from stand   Toilet Transfer: Min guard;Ambulation;Minimal assistance (chair/bed)           Functional mobility during ADLs: Min guard;Minimal assistance General ADL Comments: Educated on tub transfer technique. Recommended sitting for most of LB ADLs. Pt stood at sink and performed ADLs.  Educated on BE FAST stroke education. Briefly discussed d/c recommendations.      Vision      Pt reports blurry vision in right eye Pt wears glasses  *Pt appeared to have dysconjugate gaze with right eye moving laterally-no double  vision reported.  Visual fields: No apparent deficits Tracking/Visual Pursuits: Other (comment) (disconjugate gaze-difficult at times)             Perception     Praxis      Pertinent Vitals/Pain Pain Assessment: No/denies pain     Hand Dominance     Extremity/Trunk Assessment Upper Extremity Assessment Upper Extremity Assessment: RUE deficits/detail RUE Deficits / Details: felt slightly weaker than LUE but WFL   Lower Extremity Assessment Lower Extremity Assessment: Defer to PT evaluation   Cervical / Trunk Assessment Cervical / Trunk Assessment: Normal   Communication Communication Communication: No difficulties   Cognition Arousal/Alertness: Awake/alert Behavior During Therapy: WFL for tasks assessed/performed Overall Cognitive Status: Within Functional Limits for tasks assessed                     General Comments       Exercises       Shoulder Instructions      Home Living Family/patient expects to be discharged to:: Private residence Living Arrangements: Spouse/significant other;Children (foster children; around 2 years old) Available Help at Discharge: Family;Available 24 hours/day Type of Home: House Home Access: Level entry     Home Layout: One level     Bathroom Shower/Tub: Teacher, early years/pre: Standard (sink close by)     Home Equipment: Shower seat          Prior Functioning/Environment Level of Independence: Independent        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  OT Diagnosis: Disturbance of vision;Other (comment) (decreased balance)   OT Problem List: Decreased strength;Impaired balance (sitting and/or standing);Impaired vision/perception;Decreased knowledge of use of DME or AE;Decreased knowledge of precautions   OT Treatment/Interventions: Self-care/ADL training;DME and/or AE instruction;Therapeutic activities;Patient/family education;Balance training;Visual/perceptual  remediation/compensation;Therapeutic exercise    OT Goals(Current goals can be found in the care plan section) Acute Rehab OT Goals Patient Stated Goal: get back to work OT Goal Formulation: With patient Time For Goal Achievement: 12/14/13 Potential to Achieve Goals: Good ADL Goals Pt Will Transfer to Toilet: with modified independence;ambulating;regular height toilet Pt Will Perform Tub/Shower Transfer: Tub transfer;with supervision;ambulating;shower seat  OT Frequency: Min 2X/week   Barriers to D/C:            Co-evaluation              End of Session Equipment Utilized During Treatment: Gait belt Nurse Communication: Mobility status  Activity Tolerance: Patient tolerated treatment well Patient left: in bed;with call bell/phone within reach;with bed alarm set;with family/visitor present;with nursing/sitter in room   Time: 1549-1617 OT Time Calculation (min): 28 min Charges:  OT General Charges $OT Visit: 1 Procedure OT Evaluation $Initial OT Evaluation Tier I: 1 Procedure OT Treatments $Self Care/Home Management : 8-22 mins G-Codes: OT G-codes **NOT FOR INPATIENT CLASS** Functional Assessment Tool Used: clinical judgment Functional Limitation: Self care Self Care Current Status [id number]): At least 1 percent but less than 20 percent impaired, limited or restricted Self Care Goal Status [id number]): 0 percent impaired, limited or restricted  Benito Mccreedy OTR/L [id number] 12/07/2013, 4:48 PM

## 2013-12-07 NOTE — Progress Notes (Signed)
Pt admitted from the ED alert and oriented, c/o of slight headache but was given tylenol tab in the ED, pt settled in bed,put on telemetry and call light at bedside,pt reassured, will however continue to monitor. Obasogie-Asidi, Mallory Schaad Efe

## 2013-12-07 NOTE — ED Notes (Signed)
Admitting Dr. At Bedside.

## 2013-12-07 NOTE — ED Notes (Signed)
Report given to Abbe Amsterdam, RN patient going to room 4N22.

## 2013-12-07 NOTE — Progress Notes (Signed)
Patient Demographics  James Henderson, is a 63 y.o. male, DOB - 1950/11/12, [id number]  Admit date - 12/06/2013   Admitting Physician Toy Baker, MD  Outpatient Primary MD for the patient is [facility]  LOS - 1   Chief Complaint  Patient presents with  . Dizziness  . Blurred Vision     brief narrative: Patient admitted for blurry vision, unsteady gait, vertigo, CT head did not show any acute findings, but did show evidence of possible old ischemic disease, patient admitted for further workup and evaluation.   Subjective:   [name] today has, No headache, No chest pain, No abdominal pain - No Nausea, No new weakness tingling or numbness, No Cough - SOB.   Assessment & Plan    Active Problems:   Essential hypertension   Dizziness   DM (diabetes mellitus) type II controlled, neurological manifestation   TIA (transient ischemic attack)  Vertigo/unsteady gait/blurry vision - Patient has abnormal physical exam, will need to rule out TIA/CVA -Will obtain 2-D echo, monitor on telemetry, MRI brain, MRA head and neck, carotid Doppler. -Continue with aspirin and statin meanwhile. -PT/OT consult  Diabetes mellitus -Check hemoglobin A1c -Hold metformin -Continue with insulin sliding scale  Hyperlipidemia -Opinion with niacin and statin  Glaucoma -Continue with home medication  Code Status: Full  Family Communication: Patient  is alert and oriented  Disposition Plan: Home when stable   Procedures None   Consults   None   Medications  Scheduled Meds: . aspirin  325 mg Oral Daily  . brimonidine  1 drop Both Eyes TID  . brinzolamide  1 drop Both Eyes TID  . [START ON 12/08/2013] Influenza vac split quadrivalent PF  0.5 mL Intramuscular Tomorrow-1000  . insulin aspart  0-9 Units Subcutaneous 6 times per day  . niacin  500  mg Oral BID WC  . [START ON 12/08/2013] pneumococcal 23 valent vaccine  0.5 mL Intramuscular Tomorrow-1000  . pravastatin  20 mg Oral q1800  . timolol  1 drop Both Eyes Daily  . Travoprost (BAK Free)  1 drop Both Eyes QHS   Continuous Infusions: . sodium chloride 75 mL/hr at 12/07/13 0206   PRN Meds:.acetaminophen  DVT Prophylaxis   Heparin - SCDs  Lab Results  Component Value Date   PLT 219 12/06/2013    Antibiotics    Anti-infectives   None          Objective:   Filed Vitals:   12/07/13 0400 12/07/13 0600 12/07/13 0800 12/07/13 1000  BP: 98/58 104/69 116/66 122/64  Pulse: 54 71 58 59  Temp:  98.2 F (36.8 C) 98.2 F (36.8 C) 98.2 F (36.8 C)  TempSrc:  Oral Oral Oral  Resp: 18 18 18 18   Height:      Weight:      SpO2: 98% 99% 98% 99%    Wt Readings from Last 3 Encounters:  12/07/13 76.204 kg (168 lb)     Intake/Output Summary (Last 24 hours) at 12/07/13 1052 Last data filed at 12/07/13 0839  Gross per 24 hour  Intake   1120 ml  Output      0 ml  Net   1120 ml     Physical Exam  Awake Alert, Oriented  X 3, , Normal affect [place].AT,PERRAL has left I diminished medial gaze, unsteady gait, slightly diminished left side alternative movement, negative Romberg sign. Supple Neck,No JVD, No cervical lymphadenopathy appriciated.  Symmetrical Chest wall movement, Good air movement bilaterally, CTAB RRR,No Gallops,Rubs or new Murmurs, No Parasternal Heave +ve B.Sounds, Abd Soft, No tenderness, No organomegaly appriciated, No rebound - guarding or rigidity. No Cyanosis, Clubbing or edema, No new Rash or bruise     Data Review   Micro Results No results found for this or any previous visit (from the past 240 hour(s)).  Radiology Reports Ct Head (brain) Wo Contrast  12/06/2013   CLINICAL DATA:  Dizziness and blurry vision beginning today at 10:30 a.m. mild headache.  EXAM: CT HEAD WITHOUT CONTRAST  TECHNIQUE: Contiguous axial images were obtained from the  base of the skull through the vertex without intravenous contrast.  COMPARISON:  None.  FINDINGS: The ventricles and sulci are normal for age. No intraparenchymal hemorrhage, mass effect nor midline shift. Patchy supratentorial white matter hypodensities are greater than expected for patient's age and though non-specific suggest sequelae of chronic small vessel ischemic disease. No acute large vascular territory infarcts. 10 mm left cerebellar calcification. Mild calcific atherosclerosis with carotid siphons.  No abnormal extra-axial fluid collections. Basal cisterns are patent. Mild calcific atherosclerosis of the carotid siphons.  No skull fracture. The included ocular globes and orbital contents are non-suspicious. The mastoid aircells and included paranasal sinuses are well-aerated.  IMPRESSION: No acute intracranial process; however, moderate white matter changes, advanced for age, may obscure acute ischemia. If clinically indicated, MRI of the brain with diffusion-weighted sequences would be more sensitive.  LEFT occipital encephalomalacia suggest remote ischemic or traumatic etiology.  10 mm LEFT cerebellar calcification can be seen with cavernoma.   Electronically Signed   By: Elon Alas   On: 12/06/2013 22:16    CBC  Recent Labs Lab 12/06/13 2220  WBC 4.9  HGB 13.5  HCT 40.8  PLT 219  MCV 91.7  MCH 30.3  MCHC 33.1  RDW 12.8  LYMPHSABS 1.8  MONOABS 0.4  EOSABS 0.2  BASOSABS 0.0    Chemistries   Recent Labs Lab 12/06/13 2220  NA 141  K 4.3  CL 103  CO2 27  GLUCOSE 117*  BUN 24*  CREATININE 1.59*  CALCIUM 9.5  AST 17  ALT 14  ALKPHOS 108  BILITOT 0.2*   ------------------------------------------------------------------------------------------------------------------ estimated creatinine clearance is 43.2 ml/min (by C-G formula based on Cr of  1.59). ------------------------------------------------------------------------------------------------------------------ No results found for this basename: HGBA1C,  in the last 72 hours ------------------------------------------------------------------------------------------------------------------  Recent Labs  12/07/13 0510  CHOL 170  HDL 33*  LDLCALC 100*  TRIG 185*  CHOLHDL 5.2   ------------------------------------------------------------------------------------------------------------------ No results found for this basename: TSH, T4TOTAL, FREET3, T3FREE, THYROIDAB,  in the last 72 hours ------------------------------------------------------------------------------------------------------------------ No results found for this basename: VITAMINB12, FOLATE, FERRITIN, TIBC, IRON, RETICCTPCT,  in the last 72 hours  Coagulation profile  Recent Labs Lab 12/06/13 2220  INR 1.07    No results found for this basename: DDIMER,  in the last 72 hours  Cardiac Enzymes  Recent Labs Lab 12/07/13 0510  TROPONINI <0.30   ------------------------------------------------------------------------------------------------------------------ No components found with this basename: POCBNP,      Time Spent in minutes   30 minutes   Glenn Gullickson M.D on 12/07/2013 at 10:52 AM  Between 7am to 7pm - Pager - [phone]  After 7pm go to [email or web address] - password [email or web address]  And look for the  night coverage person covering for me after hours  [facility] Office  [phone]   **Disclaimer: This note may have been dictated with voice recognition software. Similar sounding words can inadvertently be transcribed and this note may contain transcription errors which may not have been corrected upon publication of note.**

## 2013-12-07 NOTE — Progress Notes (Signed)
Rehab Admissions Coordinator Note:  Patient was screened by Retta Diones for appropriateness for an Inpatient Acute Rehab Consult.  MRI is pending.  Would like to wait until MRI is completed prior to recommending inpatient rehab vs other venue of care.    Retta Diones 12/07/2013, 3:23 PM  I can be reached at [phone].

## 2013-12-07 NOTE — ED Notes (Signed)
Attempted to call report

## 2013-12-08 DIAGNOSIS — Z823 Family history of stroke: Secondary | ICD-10-CM | POA: Diagnosis not present

## 2013-12-08 DIAGNOSIS — E785 Hyperlipidemia, unspecified: Secondary | ICD-10-CM | POA: Diagnosis present

## 2013-12-08 DIAGNOSIS — Z7982 Long term (current) use of aspirin: Secondary | ICD-10-CM | POA: Diagnosis not present

## 2013-12-08 DIAGNOSIS — Z6828 Body mass index (BMI) 28.0-28.9, adult: Secondary | ICD-10-CM | POA: Diagnosis not present

## 2013-12-08 DIAGNOSIS — I10 Essential (primary) hypertension: Secondary | ICD-10-CM | POA: Diagnosis present

## 2013-12-08 DIAGNOSIS — E1149 Type 2 diabetes mellitus with other diabetic neurological complication: Secondary | ICD-10-CM | POA: Diagnosis present

## 2013-12-08 DIAGNOSIS — I63532 Cerebral infarction due to unspecified occlusion or stenosis of left posterior cerebral artery: Secondary | ICD-10-CM | POA: Diagnosis present

## 2013-12-08 DIAGNOSIS — E669 Obesity, unspecified: Secondary | ICD-10-CM | POA: Diagnosis present

## 2013-12-08 DIAGNOSIS — R42 Dizziness and giddiness: Secondary | ICD-10-CM | POA: Diagnosis present

## 2013-12-08 DIAGNOSIS — H409 Unspecified glaucoma: Secondary | ICD-10-CM | POA: Diagnosis present

## 2013-12-08 DIAGNOSIS — Z87891 Personal history of nicotine dependence: Secondary | ICD-10-CM | POA: Diagnosis not present

## 2013-12-08 DIAGNOSIS — H538 Other visual disturbances: Secondary | ICD-10-CM | POA: Diagnosis present

## 2013-12-08 DIAGNOSIS — Z79899 Other long term (current) drug therapy: Secondary | ICD-10-CM | POA: Diagnosis not present

## 2013-12-08 LAB — GLUCOSE, CAPILLARY
Glucose-Capillary: 103 mg/dL — ABNORMAL HIGH (ref 70–99)
Glucose-Capillary: 110 mg/dL — ABNORMAL HIGH (ref 70–99)
Glucose-Capillary: 115 mg/dL — ABNORMAL HIGH (ref 70–99)
Glucose-Capillary: 116 mg/dL — ABNORMAL HIGH (ref 70–99)
Glucose-Capillary: 141 mg/dL — ABNORMAL HIGH (ref 70–99)
Glucose-Capillary: 183 mg/dL — ABNORMAL HIGH (ref 70–99)

## 2013-12-08 LAB — BASIC METABOLIC PANEL
Anion gap: 12 (ref 5–15)
BUN: 18 mg/dL (ref 6–23)
CO2: 24 mEq/L (ref 19–32)
Calcium: 8.9 mg/dL (ref 8.4–10.5)
Chloride: 106 mEq/L (ref 96–112)
Creatinine, Ser: 1.29 mg/dL (ref 0.50–1.35)
GFR calc Af Amer: 66 mL/min — ABNORMAL LOW (ref >90)
GFR calc non Af Amer: 57 mL/min — ABNORMAL LOW (ref >90)
Glucose, Bld: 124 mg/dL — ABNORMAL HIGH (ref 70–99)
Potassium: 4 mEq/L (ref 3.7–5.3)
Sodium: 142 mEq/L (ref 137–147)

## 2013-12-08 LAB — CBC
HCT: 37.8 % — ABNORMAL LOW (ref 39.0–52.0)
Hemoglobin: 12.7 g/dL — ABNORMAL LOW (ref 13.0–17.0)
MCH: 30.8 pg (ref 26.0–34.0)
MCHC: 33.6 g/dL (ref 30.0–36.0)
MCV: 91.5 fL (ref 78.0–100.0)
Platelets: 190 10*3/uL (ref 150–400)
RBC: 4.13 MIL/uL — ABNORMAL LOW (ref 4.22–5.81)
RDW: 12.8 % (ref 11.5–15.5)
WBC: 4.6 10*3/uL (ref 4.0–10.5)

## 2013-12-08 LAB — URINE CULTURE
Colony Count: NO GROWTH
Culture: NO GROWTH

## 2013-12-08 LAB — HEMOGLOBIN A1C
Hgb A1c MFr Bld: 7.1 % — ABNORMAL HIGH (ref <5.7)
Mean Plasma Glucose: 157 mg/dL — ABNORMAL HIGH (ref <117)

## 2013-12-08 MED ORDER — HEPARIN SODIUM (PORCINE) 5000 UNIT/ML IJ SOLN
5000.0000 [IU] | Freq: Three times a day (TID) | INTRAMUSCULAR | Status: DC
  Administered 2013-12-08 – 2013-12-09 (×3): 5000 [IU] via SUBCUTANEOUS
  Filled 2013-12-08 (×3): qty 1

## 2013-12-08 MED ORDER — ASPIRIN EC 81 MG PO TBEC
81.0000 mg | DELAYED_RELEASE_TABLET | Freq: Every day | ORAL | Status: DC
  Administered 2013-12-08: 81 mg via ORAL
  Filled 2013-12-08: qty 1

## 2013-12-08 NOTE — Progress Notes (Signed)
Patient Demographics  James Henderson, is a 63 y.o. male, DOB - Dec 08, 1950, [id number]  Admit date - 12/06/2013   Admitting Physician Toy Baker, MD  Outpatient Primary MD for the patient is [facility]  LOS - 2   Chief Complaint  Patient presents with  . Dizziness  . Blurred Vision     brief narrative: Patient admitted for blurry vision, unsteady gait, vertigo, CT head did not show any acute findings, but did show evidence of possible old ischemic disease, patient admitted for further workup and evaluation. MRI brain disclosed a small focal area of restricted diffusion in the periaqueductal midbrain on the LEFT extending ventrally for nearly 1 cm, towards the interpeduncular notch. Multiple areas of microbleeds noted.    Subjective:   James Henderson today has, No headache, No chest pain, No abdominal pain - No Nausea, No new weakness tingling or numbness, No Cough - SOB.   Assessment & Plan    Active Problems:   Essential hypertension   Dizziness   DM (diabetes mellitus) type II controlled, neurological manifestation   TIA (transient ischemic attack)  Acute CVA -MRI brain disclosed a small focal area of restricted diffusion in the periaqueductal midbrain on the LEFT extending ventrally for nearly 1 cm, towards the interpeduncular notch. Multiple areas of microbleeds noted. -Continue with aspirin, statin -Neurology input appreciated -PT/OT consulted. - 2-D echo showing EF 55 %, and grade 1 diastolic dysfunction. -Carotid Doppler pending   Diabetes mellitus -Check hemoglobin A1c -Hold metformin -Continue with insulin sliding scale  Hyperlipidemia -cont with niacin and statin  Glaucoma -Continue with home medication  Code Status: Full  Family Communication: Patient  is alert and oriented  Disposition Plan: Home when  stable   Procedures MRI brain MRA head and neck 2-D echo    Consults   Neurology   Medications  Scheduled Meds: . aspirin EC  81 mg Oral Daily  . brimonidine  1 drop Both Eyes TID  . brinzolamide  1 drop Both Eyes TID  . Influenza vac split quadrivalent PF  0.5 mL Intramuscular Tomorrow-1000  . insulin aspart  0-9 Units Subcutaneous 6 times per day  . niacin  500 mg Oral BID WC  . pneumococcal 23 valent vaccine  0.5 mL Intramuscular Tomorrow-1000  . pravastatin  20 mg Oral q1800  . timolol  1 drop Both Eyes Daily  . Travoprost (BAK Free)  1 drop Both Eyes QHS   Continuous Infusions:   PRN Meds:.acetaminophen  DVT Prophylaxis   Heparin - SCDs  Lab Results  Component Value Date   PLT 190 12/08/2013    Antibiotics    Anti-infectives   None          Objective:   Filed Vitals:   12/07/13 2219 12/08/13 0111 12/08/13 0647 12/08/13 1007  BP: 126/69 106/63 110/64 124/65  Pulse: 57 52 60 58  Temp: 98.4 F (36.9 C) 98.4 F (36.9 C) 99 F (37.2 C) 97.6 F (36.4 C)  TempSrc: Oral Oral Oral Oral  Resp: 18 22 20 20   Height:      Weight:      SpO2: 100% 99% 100% 100%    Wt Readings from Last 3 Encounters:  12/07/13 76.204 kg (168 lb)  Intake/Output Summary (Last 24 hours) at 12/08/13 1322 Last data filed at 12/07/13 1829  Gross per 24 hour  Intake    340 ml  Output      0 ml  Net    340 ml     Physical Exam  Awake Alert, Oriented X 3, , Normal affect [place].AT,PERRAL has left I diminished medial gaze, unsteady gait, slightly diminished left side alternative movement, negative Romberg sign. Supple Neck,No JVD, No cervical lymphadenopathy appriciated.  Symmetrical Chest wall movement, Good air movement bilaterally, CTAB RRR,No Gallops,Rubs or new Murmurs, No Parasternal Heave +ve B.Sounds, Abd Soft, No tenderness, No organomegaly appriciated, No rebound - guarding or rigidity. No Cyanosis, Clubbing or edema, No new Rash or bruise     Data  Review   Micro Results Recent Results (from the past 240 hour(s))  URINE CULTURE     Status: None   Collection Time    12/07/13 12:32 AM      Result Value Ref Range Status   Specimen Description URINE, RANDOM   Final   Special Requests ADDED 0152   Final   Culture  Setup Time     Final   Value: 12/07/2013 08:52     Performed at [place]     Final   Value: NO GROWTH     Performed at [facility]   Culture     Final   Value: NO GROWTH     Performed at [facility]   Report Status 12/08/2013 FINAL   Final    Radiology Reports Ct Head (brain) Wo Contrast  12/06/2013   CLINICAL DATA:  Dizziness and blurry vision beginning today at 10:30 a.m. mild headache.  EXAM: CT HEAD WITHOUT CONTRAST  TECHNIQUE: Contiguous axial images were obtained from the base of the skull through the vertex without intravenous contrast.  COMPARISON:  None.  FINDINGS: The ventricles and sulci are normal for age. No intraparenchymal hemorrhage, mass effect nor midline shift. Patchy supratentorial white matter hypodensities are greater than expected for patient's age and though non-specific suggest sequelae of chronic small vessel ischemic disease. No acute large vascular territory infarcts. 10 mm left cerebellar calcification. Mild calcific atherosclerosis with carotid siphons.  No abnormal extra-axial fluid collections. Basal cisterns are patent. Mild calcific atherosclerosis of the carotid siphons.  No skull fracture. The included ocular globes and orbital contents are non-suspicious. The mastoid aircells and included paranasal sinuses are well-aerated.  IMPRESSION: No acute intracranial process; however, moderate white matter changes, advanced for age, may obscure acute ischemia. If clinically indicated, MRI of the brain with diffusion-weighted sequences would be more sensitive.  LEFT occipital encephalomalacia suggest remote ischemic or traumatic etiology.  10 mm LEFT  cerebellar calcification can be seen with cavernoma.   Electronically Signed   By: Elon Alas   On: 12/06/2013 22:16   Mri Brain Without Contrast  12/07/2013   CLINICAL DATA:  Headaches. Imbalance, blurred vision. Vertigo. Stroke risk factors include hypertension, hyperlipidemia, and diabetes mellitus.  EXAM: MRI HEAD WITHOUT CONTRAST  MRA HEAD WITHOUT CONTRAST  TECHNIQUE: Multiplanar, multiecho pulse sequences of the brain and surrounding structures were obtained without intravenous contrast. Angiographic images of the head were obtained using MRA technique without contrast.  COMPARISON:  None.  FINDINGS: MRI HEAD FINDINGS  There is a focal area of restricted diffusion in the periaqueductal midbrain on the LEFT extending ventrally for nearly 1 cm, towards the interpeduncular notch, consistent with acute brainstem infarction. No  other similar areas are noted.  No acute hemorrhage, hydrocephalus, or extra-axial fluid.  Generalized atrophy. Moderately extensive T2 and FLAIR hyperintensities consistent with chronic microvascular ischemic change. Scattered areas of lacunar infarction.  Gradient sequence demonstrates widespread foci of microhemorrhage throughout the cerebral hemispheres, brainstem, deep nuclei, and BILATERAL cerebellar hemispheres. This pattern is most commonly associated with longstanding hypertensive cerebrovascular disease, but can be seen as a sequelae of cerebral amyloid angiopathy, less favored due to the lack of visible lobar hemorrhage.  Flow voids are maintained. Cervical spondylosis. Extracranial soft tissues unremarkable.  MRA HEAD FINDINGS  The internal carotid arteries demonstrate minor irregularity there cavernous segments but are otherwise patent without flow reducing lesion. Codominant vertebral arteries without distal V4 segment narrowing.  The RIGHT A1 ACA is the dominant contributor to both anterior cerebrals; the LEFT A1 ACA is diseased but patent.  No proximal M1 MCA  stenosis on the RIGHT or LEFT. No MCA branch occlusion.  Both posterior cerebral arteries are patent, with a severe 90% or greater stenosis of the distal P1 segment on the LEFT. No cerebellar branch occlusion. No visible aneurysm. No basilar dissection or stenosis.  IMPRESSION: Focal area of acute midbrain infarction affecting the LEFT periaqueductal gray matter and regional white matter fiber tracts.  Widespread microbleeds throughout the brain, consistent with chronic hypertensive cerebrovascular disease.  Severe 90% or greater stenosis of the distal P1 LEFT PCA segment.   Electronically Signed   By: Rolla Flatten M.D.   On: 12/07/2013 19:29   Mr Jodene Nam Head/brain Wo Cm  12/07/2013   CLINICAL DATA:  Headaches. Imbalance, blurred vision. Vertigo. Stroke risk factors include hypertension, hyperlipidemia, and diabetes mellitus.  EXAM: MRI HEAD WITHOUT CONTRAST  MRA HEAD WITHOUT CONTRAST  TECHNIQUE: Multiplanar, multiecho pulse sequences of the brain and surrounding structures were obtained without intravenous contrast. Angiographic images of the head were obtained using MRA technique without contrast.  COMPARISON:  None.  FINDINGS: MRI HEAD FINDINGS  There is a focal area of restricted diffusion in the periaqueductal midbrain on the LEFT extending ventrally for nearly 1 cm, towards the interpeduncular notch, consistent with acute brainstem infarction. No other similar areas are noted.  No acute hemorrhage, hydrocephalus, or extra-axial fluid.  Generalized atrophy. Moderately extensive T2 and FLAIR hyperintensities consistent with chronic microvascular ischemic change. Scattered areas of lacunar infarction.  Gradient sequence demonstrates widespread foci of microhemorrhage throughout the cerebral hemispheres, brainstem, deep nuclei, and BILATERAL cerebellar hemispheres. This pattern is most commonly associated with longstanding hypertensive cerebrovascular disease, but can be seen as a sequelae of cerebral amyloid  angiopathy, less favored due to the lack of visible lobar hemorrhage.  Flow voids are maintained. Cervical spondylosis. Extracranial soft tissues unremarkable.  MRA HEAD FINDINGS  The internal carotid arteries demonstrate minor irregularity there cavernous segments but are otherwise patent without flow reducing lesion. Codominant vertebral arteries without distal V4 segment narrowing.  The RIGHT A1 ACA is the dominant contributor to both anterior cerebrals; the LEFT A1 ACA is diseased but patent.  No proximal M1 MCA stenosis on the RIGHT or LEFT. No MCA branch occlusion.  Both posterior cerebral arteries are patent, with a severe 90% or greater stenosis of the distal P1 segment on the LEFT. No cerebellar branch occlusion. No visible aneurysm. No basilar dissection or stenosis.  IMPRESSION: Focal area of acute midbrain infarction affecting the LEFT periaqueductal gray matter and regional white matter fiber tracts.  Widespread microbleeds throughout the brain, consistent with chronic hypertensive cerebrovascular disease.  Severe 90% or  greater stenosis of the distal P1 LEFT PCA segment.   Electronically Signed   By: Rolla Flatten M.D.   On: 12/07/2013 19:29    CBC  Recent Labs Lab 12/06/13 2220 12/08/13 0734  WBC 4.9 4.6  HGB 13.5 12.7*  HCT 40.8 37.8*  PLT 219 190  MCV 91.7 91.5  MCH 30.3 30.8  MCHC 33.1 33.6  RDW 12.8 12.8  LYMPHSABS 1.8  --   MONOABS 0.4  --   EOSABS 0.2  --   BASOSABS 0.0  --     Chemistries   Recent Labs Lab 12/06/13 2220 12/08/13 0734  NA 141 142  K 4.3 4.0  CL 103 106  CO2 27 24  GLUCOSE 117* 124*  BUN 24* 18  CREATININE 1.59* 1.29  CALCIUM 9.5 8.9  AST 17  --   ALT 14  --   ALKPHOS 108  --   BILITOT 0.2*  --    ------------------------------------------------------------------------------------------------------------------ estimated creatinine clearance is 53.3 ml/min (by C-G formula based on Cr of  1.29). ------------------------------------------------------------------------------------------------------------------  Recent Labs  12/07/13 0510  HGBA1C 7.3*   ------------------------------------------------------------------------------------------------------------------  Recent Labs  12/07/13 0510  CHOL 170  HDL 33*  LDLCALC 100*  TRIG 185*  CHOLHDL 5.2   ------------------------------------------------------------------------------------------------------------------ No results found for this basename: TSH, T4TOTAL, FREET3, T3FREE, THYROIDAB,  in the last 72 hours ------------------------------------------------------------------------------------------------------------------ No results found for this basename: VITAMINB12, FOLATE, FERRITIN, TIBC, IRON, RETICCTPCT,  in the last 72 hours  Coagulation profile  Recent Labs Lab 12/06/13 2220  INR 1.07    No results found for this basename: DDIMER,  in the last 72 hours  Cardiac Enzymes  Recent Labs Lab 12/07/13 0510 12/07/13 1139 12/07/13 1730  TROPONINI <0.30 <0.30 <0.30   ------------------------------------------------------------------------------------------------------------------ No components found with this basename: POCBNP,      Time Spent in minutes   25 minutes   Idalee Foxworthy M.D on 12/08/2013 at 1:22 PM  Between 7am to 7pm - Pager - [phone]  After 7pm go to [email or web address] - password [email or web address]  And look for the night coverage person covering for me after hours  [facility] Office  [phone]   **Disclaimer: This note may have been dictated with voice recognition software. Similar sounding words can inadvertently be transcribed and this note may contain transcription errors which may not have been corrected upon publication of note.**

## 2013-12-08 NOTE — Progress Notes (Signed)
Physical Therapy Treatment Patient Details Name: James Henderson MRN: [id number] DOB: Apr 16, 1950 Today's Date: 12/08/2013    History of Present Illness Adm 12/06/13 due to blurry vision and imbalance (pt denies vertigo symptoms occurred). CT head inconclusive re: ?CVA, MRI showed Lt brainstem infarct, Lt PCA 90% occluded. PMHx- DM, glaucoma, HTN    PT Comments    Pt with improved static balance and during ambulation. He continues to lose his balance with narrow base of support or one-legged stance (leans/falls/steps to his right). Discussed option of home with OPPT and pt very receptive. Especially encouraged completing OPPT for improved balance prior to returning to work in maintenance at [employer]. Pt agreed. MD updated.    Follow Up Recommendations  Supervision/Assistance - 24 hour;Outpatient PT     Equipment Recommendations  None recommended by PT    Recommendations for Other Services       Precautions / Restrictions Precautions Precautions: Fall    Mobility  Bed Mobility Overal bed mobility: Modified Independent             General bed mobility comments: HOB flat, no rail  Transfers Overall transfer level: Independent Equipment used: None Transfers: Sit to/from Stand              Ambulation/Gait Ambulation/Gait assistance: Min Dispensing optician (Feet): 300 Feet Assistive device: None (vs IV pole) Gait Pattern/deviations: Step-through pattern;Wide base of support Gait velocity: decr; able to incr with cues   General Gait Details: pt midline while walking, including with head turns   Stairs            Wheelchair Mobility    Modified Rankin (Stroke Patients Only) Modified Rankin (Stroke Patients Only) Pre-Morbid Rankin Score: No symptoms Modified Rankin: Moderately severe disability     Balance Overall balance assessment: Needs assistance         Standing balance support: No upper extremity supported Standing  balance-Leahy Scale: Good   Single Leg Stance - Right Leg: 2 (leans/loses balance to his Rt) Single Leg Stance - Left Leg: 4 (puts right foot down) Tandem Stance - Right Leg: 10 (midline with rt foot forward or Lt ) Tandem Stance - Left Leg: 10 Rhomberg - Eyes Opened: 0 (unable to attain position with drift and step to his Rt)          Cognition Arousal/Alertness: Awake/alert Behavior During Therapy: WFL for tasks assessed/performed Overall Cognitive Status: Within Functional Limits for tasks assessed                      Exercises General Exercises - Lower Extremity Toe Raises: AAROM;Both;5 reps (for balance ) Heel Raises: AAROM;Both;5 reps (for balance)    General Comments General comments (skin integrity, edema, etc.): Wife present and confirms availability during day to assist pt as needed (including driving to appts). Pt inquiring about when he may return to work and instructed him to address with MD.       Pertinent Vitals/Pain Pain Assessment: No/denies pain    Home Living                      Prior Function            PT Goals (current goals can now be found in the care plan section) Acute Rehab PT Goals Patient Stated Goal: regain his balance; return to work at school Progress towards PT goals: Progressing toward goals    Frequency  Min 4X/week    PT Plan  Discharge plan needs to be updated    Co-evaluation             End of Session Equipment Utilized During Treatment: Gait belt Activity Tolerance: Patient tolerated treatment well Patient left: in chair;with call bell/phone within reach;with chair alarm set     Time: [id number] PT Time Calculation (min): 21 min  Charges:  $Gait Training: 8-22 mins                    G Codes:      James Henderson December 17, 2013, 11:32 AM Pager [phone]

## 2013-12-08 NOTE — Consult Note (Signed)
Referring Physician: Dr. Marvel Plan    Chief Complaint: unsteadiness, blurred vision, stroke on MRI  HPI:                                                                                                                                         James Henderson is an 63 y.o. male with a past medical history significant for HTN, hyperlipidemia, DM, admitted to [facility] for further evaluation of the above stated symptoms. Patient indicated that on 10/18 he developed acute onset of marked unsteadiness and blurred vision which he never experienced before. This was not associated with vertigo (although listed as a symptom on presentation he denied it today), double vision, difficulty swallowing, focal weakness or numbness, slurred speech, confusion, or language impairment. Symptoms did not improve during the day and he decided to seek medical attention several hours later. Initial CT brain here at [facility] revealed no obvious acute abnormality but a subsequent MRI brain disclosed a small focal area of restricted diffusion in the periaqueductal midbrain on the LEFT extending ventrally for nearly 1 cm, towards the interpeduncular notch. Multiple areas of microbleeds noted. MRA brain: the LEFT A1 ACA is diseased but patent. No proximal M1 MCA stenosis on the RIGHT or LEFT. No MCA branch occlusion. Both posterior cerebral arteries are patent, with severe 90% or greater stenosis of the distal P1 segment on the LEFT. No cerebellar  branch occlusion. No visible aneurysm. No basilar dissection or stenosis. CUS without hemodynamically significant carotid disease. Cholesterol 170, triglycerides 185, HDL 33, LDL 100. Started on aspirin 81 mg daily. Pravachol 20 mg.  Date last known well: 12/06/13 Time last known well: 9 am. tPA Given: no, out of the window    Past Medical History  Diagnosis Date  . Hypertension   . Hyperlipidemia   . Diabetes mellitus without complication   . Glaucoma     Past Surgical History   Procedure Laterality Date  . Eye surgery      Family History  Problem Relation Age of Onset  . Stroke Mother   . Stroke Brother   . Diabetes type II Brother    Social History:  reports that he has quit smoking. He does not have any smokeless tobacco history on file. He reports that he does not drink alcohol or use illicit drugs.  Allergies: No Known Allergies  Medications:  Scheduled: . aspirin EC  81 mg Oral Daily  . brimonidine  1 drop Both Eyes TID  . brinzolamide  1 drop Both Eyes TID  . Influenza vac split quadrivalent PF  0.5 mL Intramuscular Tomorrow-1000  . insulin aspart  0-9 Units Subcutaneous 6 times per day  . niacin  500 mg Oral BID WC  . pneumococcal 23 valent vaccine  0.5 mL Intramuscular Tomorrow-1000  . pravastatin  20 mg Oral q1800  . timolol  1 drop Both Eyes Daily  . Travoprost (BAK Free)  1 drop Both Eyes QHS    ROS:                                                                                                                                       History obtained from the patient and chart review  General ROS: negative for - chills, fatigue, fever, night sweats, weight gain or weight loss Psychological ROS: negative for - behavioral disorder, hallucinations, memory difficulties, mood swings or suicidal ideation Ophthalmic ROS: negative for - blurry vision, double vision, eye pain or loss of vision ENT ROS: negative for - epistaxis, nasal discharge, oral lesions, sore throat, tinnitus or vertigo Allergy and Immunology ROS: negative for - hives or itchy/watery eyes Hematological and Lymphatic ROS: negative for - bleeding problems, bruising or swollen lymph nodes Endocrine ROS: negative for - galactorrhea, hair pattern changes, polydipsia/polyuria or temperature intolerance Respiratory ROS: negative for - cough, hemoptysis, shortness of  breath or wheezing Cardiovascular ROS: negative for - chest pain, dyspnea on exertion, edema or irregular heartbeat Gastrointestinal ROS: negative for - abdominal pain, diarrhea, hematemesis, nausea/vomiting or stool incontinence Genito-Urinary ROS: negative for - dysuria, hematuria, incontinence or urinary frequency/urgency Musculoskeletal ROS: negative for - joint swelling or muscular weakness Neurological ROS: as noted in HPI Dermatological ROS: negative for rash and skin lesion changes  Physical exam: pleasant male in no apparent distress. Blood pressure 110/64, pulse 60, temperature 99 F (37.2 C), temperature source Oral, resp. rate 20, height 5' 4"  (1.626 m), weight 76.204 kg (168 lb), SpO2 100.00%. Head: normocephalic. Neck: supple, no bruits, no JVD. Cardiac: no murmurs. Lungs: clear. Abdomen: soft, no tender, no mass. Extremities: no edema.  Neurologic Examination:                                                                                                      General: Mental Status: Alert, oriented, thought content appropriate.  Speech fluent without evidence of aphasia.  Able  to follow 3 step commands without difficulty. Cranial Nerves: II: Discs flat bilaterally; Visual fields grossly normal, pupils equal, round, reactive to light and accommodation III,IV, VI: ptosis not present, can not fully adduct left eye V,VII: smile symmetric, facial light touch sensation normal bilaterally VIII: hearing normal bilaterally IX,X: gag reflex present XI: bilateral shoulder shrug XII: midline tongue extension without atrophy or fasciculations Motor: Right : Upper extremity   5/5    Left:     Upper extremity   5/5  Lower extremity   5/5     Lower extremity   5/5 Tone and bulk:normal tone throughout; no atrophy noted Sensory: Pinprick and light touch intact throughout, bilaterally Deep Tendon Reflexes:  Right: Upper Extremity   Left: Upper extremity   biceps (C-5 to C-6) 2/4    biceps (C-5 to C-6) 2/4 tricep (C7) 2/4    triceps (C7) 2/4 Brachioradialis (C6) 2/4  Brachioradialis (C6) 2/4  Lower Extremity Lower Extremity  quadriceps (L-2 to L-4) 2/4   quadriceps (L-2 to L-4) 2/4 Achilles (S1) 2/4   Achilles (S1) 2/4  Plantars: Right: downgoing   Left: downgoing Cerebellar: normal finger-to-nose,  normal heel-to-shin test Gait: No tested due to safety reasons. CV: pulses palpable throughout    Results for orders placed during the hospital encounter of 12/06/13 (from the past 48 hour(s))  PROTIME-INR     Status: None   Collection Time    12/06/13 10:20 PM      Result Value Ref Range   Prothrombin Time 14.0  11.6 - 15.2 seconds   INR 1.07  0.00 - 1.49  APTT     Status: None   Collection Time    12/06/13 10:20 PM      Result Value Ref Range   aPTT 29  24 - 37 seconds  CBC     Status: None   Collection Time    12/06/13 10:20 PM      Result Value Ref Range   WBC 4.9  4.0 - 10.5 K/uL   RBC 4.45  4.22 - 5.81 MIL/uL   Hemoglobin 13.5  13.0 - 17.0 g/dL   HCT 40.8  39.0 - 52.0 %   MCV 91.7  78.0 - 100.0 fL   MCH 30.3  26.0 - 34.0 pg   MCHC 33.1  30.0 - 36.0 g/dL   RDW 12.8  11.5 - 15.5 %   Platelets 219  150 - 400 K/uL  DIFFERENTIAL     Status: None   Collection Time    12/06/13 10:20 PM      Result Value Ref Range   Neutrophils Relative % 51  43 - 77 %   Neutro Abs 2.5  1.7 - 7.7 K/uL   Lymphocytes Relative 36  12 - 46 %   Lymphs Abs 1.8  0.7 - 4.0 K/uL   Monocytes Relative 9  3 - 12 %   Monocytes Absolute 0.4  0.1 - 1.0 K/uL   Eosinophils Relative 4  0 - 5 %   Eosinophils Absolute 0.2  0.0 - 0.7 K/uL   Basophils Relative 0  0 - 1 %   Basophils Absolute 0.0  0.0 - 0.1 K/uL  COMPREHENSIVE METABOLIC PANEL     Status: Abnormal   Collection Time    12/06/13 10:20 PM      Result Value Ref Range   Sodium 141  137 - 147 mEq/L   Potassium 4.3  3.7 - 5.3 mEq/L   Chloride 103  96 -  112 mEq/L   CO2 27  19 - 32 mEq/L   Glucose, Bld 117 (*) 70 - 99  mg/dL   BUN 24 (*) 6 - 23 mg/dL   Creatinine, Ser 1.59 (*) 0.50 - 1.35 mg/dL   Calcium 9.5  8.4 - 10.5 mg/dL   Total Protein 7.7  6.0 - 8.3 g/dL   Albumin 3.9  3.5 - 5.2 g/dL   AST 17  0 - 37 U/L   ALT 14  0 - 53 U/L   Alkaline Phosphatase 108  39 - 117 U/L   Total Bilirubin 0.2 (*) 0.3 - 1.2 mg/dL   GFR calc non Af Amer 44 (*) >90 mL/min   GFR calc Af Amer 51 (*) >90 mL/min   Comment: (NOTE)     The eGFR has been calculated using the CKD EPI equation.     This calculation has not been validated in all clinical situations.     eGFR's persistently <90 mL/min signify possible Chronic Kidney     Disease.   Anion gap 11  5 - 15  CBG MONITORING, ED     Status: Abnormal   Collection Time    12/06/13 10:22 PM      Result Value Ref Range   Glucose-Capillary 104 (*) 70 - 99 mg/dL  I-STAT TROPOININ, ED     Status: None   Collection Time    12/06/13 10:29 PM      Result Value Ref Range   Troponin i, poc 0.01  0.00 - 0.08 ng/mL   Comment 3            Comment: Due to the release kinetics of cTnI,     a negative result within the first hours     of the onset of symptoms does not rule out     myocardial infarction with certainty.     If myocardial infarction is still suspected,     repeat the test at appropriate intervals.  URINALYSIS, ROUTINE W REFLEX MICROSCOPIC     Status: Abnormal   Collection Time    12/07/13 12:32 AM      Result Value Ref Range   Color, Urine YELLOW  YELLOW   APPearance CLOUDY (*) CLEAR   Specific Gravity, Urine 1.018  1.005 - 1.030   pH 6.5  5.0 - 8.0   Glucose, UA NEGATIVE  NEGATIVE mg/dL   Hgb urine dipstick NEGATIVE  NEGATIVE   Bilirubin Urine NEGATIVE  NEGATIVE   Ketones, ur NEGATIVE  NEGATIVE mg/dL   Protein, ur NEGATIVE  NEGATIVE mg/dL   Urobilinogen, UA 0.2  0.0 - 1.0 mg/dL   Nitrite NEGATIVE  NEGATIVE   Leukocytes, UA NEGATIVE  NEGATIVE   Comment: MICROSCOPIC NOT DONE ON URINES WITH NEGATIVE PROTEIN, BLOOD, LEUKOCYTES, NITRITE, OR GLUCOSE <1000  mg/dL.  SODIUM, URINE, RANDOM     Status: None   Collection Time    12/07/13 12:32 AM      Result Value Ref Range   Sodium, Ur 145    CREATININE, URINE, RANDOM     Status: None   Collection Time    12/07/13 12:32 AM      Result Value Ref Range   Creatinine, Urine 144.58    GLUCOSE, CAPILLARY     Status: Abnormal   Collection Time    12/07/13  2:12 AM      Result Value Ref Range   Glucose-Capillary 107 (*) 70 - 99 mg/dL   Comment 1 Documented in Chart  Comment 2 Notify RN    HEMOGLOBIN A1C     Status: Abnormal   Collection Time    12/07/13  5:10 AM      Result Value Ref Range   Hemoglobin A1C 7.3 (*) <5.7 %   Comment: (NOTE)                                                                               According to the ADA Clinical Practice Recommendations for 2011, when     HbA1c is used as a screening test:      >=6.5%   Diagnostic of Diabetes Mellitus               (if abnormal result is confirmed)     5.7-6.4%   Increased risk of developing Diabetes Mellitus     References:Diagnosis and Classification of Diabetes Mellitus,Diabetes     DGLO,7564,33(IRJJO 1):S62-S69 and Standards of Medical Care in             Diabetes - 2011,Diabetes ACZY,6063,01 (Suppl 1):S11-S61.   Mean Plasma Glucose 163 (*) <117 mg/dL   Comment: Performed at [place]     Status: Abnormal   Collection Time    12/07/13  5:10 AM      Result Value Ref Range   Cholesterol 170  0 - 200 mg/dL   Triglycerides 185 (*) <150 mg/dL   HDL 33 (*) >39 mg/dL   Total CHOL/HDL Ratio 5.2     VLDL 37  0 - 40 mg/dL   LDL Cholesterol 100 (*) 0 - 99 mg/dL   Comment:            Total Cholesterol/HDL:CHD Risk     Coronary Heart Disease Risk Table                         Men   Women      1/2 Average Risk   3.4   3.3      Average Risk       5.0   4.4      2 X Average Risk   9.6   7.1      3 X Average Risk  23.4   11.0                Use the calculated Patient Ratio     above and the CHD  Risk Table     to determine the patient's CHD Risk.                ATP III CLASSIFICATION (LDL):      <100     mg/dL   Optimal      100-129  mg/dL   Near or Above                        Optimal      130-159  mg/dL   Borderline      160-189  mg/dL   High      >190     mg/dL   Very High  TROPONIN I     Status: None   Collection  Time    12/07/13  5:10 AM      Result Value Ref Range   Troponin I <0.30  <0.30 ng/mL   Comment:            Due to the release kinetics of cTnI,     a negative result within the first hours     of the onset of symptoms does not rule out     myocardial infarction with certainty.     If myocardial infarction is still suspected,     repeat the test at appropriate intervals.  GLUCOSE, CAPILLARY     Status: Abnormal   Collection Time    12/07/13  6:50 AM      Result Value Ref Range   Glucose-Capillary 113 (*) 70 - 99 mg/dL   Comment 1 Documented in Chart     Comment 2 Notify RN    TROPONIN I     Status: None   Collection Time    12/07/13 11:39 AM      Result Value Ref Range   Troponin I <0.30  <0.30 ng/mL   Comment:            Due to the release kinetics of cTnI,     a negative result within the first hours     of the onset of symptoms does not rule out     myocardial infarction with certainty.     If myocardial infarction is still suspected,     repeat the test at appropriate intervals.  GLUCOSE, CAPILLARY     Status: Abnormal   Collection Time    12/07/13 12:01 PM      Result Value Ref Range   Glucose-Capillary 121 (*) 70 - 99 mg/dL   Comment 1 Notify RN     Comment 2 Documented in Chart    GLUCOSE, CAPILLARY     Status: None   Collection Time    12/07/13  5:08 PM      Result Value Ref Range   Glucose-Capillary 98  70 - 99 mg/dL   Comment 1 Documented in Chart     Comment 2 Notify RN    TROPONIN I     Status: None   Collection Time    12/07/13  5:30 PM      Result Value Ref Range   Troponin I <0.30  <0.30 ng/mL   Comment:            Due  to the release kinetics of cTnI,     a negative result within the first hours     of the onset of symptoms does not rule out     myocardial infarction with certainty.     If myocardial infarction is still suspected,     repeat the test at appropriate intervals.  GLUCOSE, CAPILLARY     Status: Abnormal   Collection Time    12/07/13  8:15 PM      Result Value Ref Range   Glucose-Capillary 135 (*) 70 - 99 mg/dL  GLUCOSE, CAPILLARY     Status: Abnormal   Collection Time    12/07/13 11:51 PM      Result Value Ref Range   Glucose-Capillary 110 (*) 70 - 99 mg/dL   Comment 1 Documented in Chart     Comment 2 Notify RN    GLUCOSE, CAPILLARY     Status: Abnormal   Collection Time    12/08/13  4:13 AM      Result  Value Ref Range   Glucose-Capillary 103 (*) 70 - 99 mg/dL   Comment 1 Notify RN     Comment 2 Documented in Chart    CBC     Status: Abnormal   Collection Time    12/08/13  7:34 AM      Result Value Ref Range   WBC 4.6  4.0 - 10.5 K/uL   RBC 4.13 (*) 4.22 - 5.81 MIL/uL   Hemoglobin 12.7 (*) 13.0 - 17.0 g/dL   HCT 37.8 (*) 39.0 - 52.0 %   MCV 91.5  78.0 - 100.0 fL   MCH 30.8  26.0 - 34.0 pg   MCHC 33.6  30.0 - 36.0 g/dL   RDW 12.8  11.5 - 15.5 %   Platelets 190  150 - 400 K/uL  GLUCOSE, CAPILLARY     Status: Abnormal   Collection Time    12/08/13  7:50 AM      Result Value Ref Range   Glucose-Capillary 116 (*) 70 - 99 mg/dL   Ct Head (brain) Wo Contrast  12/06/2013   CLINICAL DATA:  Dizziness and blurry vision beginning today at 10:30 a.m. mild headache.  EXAM: CT HEAD WITHOUT CONTRAST  TECHNIQUE: Contiguous axial images were obtained from the base of the skull through the vertex without intravenous contrast.  COMPARISON:  None.  FINDINGS: The ventricles and sulci are normal for age. No intraparenchymal hemorrhage, mass effect nor midline shift. Patchy supratentorial white matter hypodensities are greater than expected for patient's age and though non-specific suggest  sequelae of chronic small vessel ischemic disease. No acute large vascular territory infarcts. 10 mm left cerebellar calcification. Mild calcific atherosclerosis with carotid siphons.  No abnormal extra-axial fluid collections. Basal cisterns are patent. Mild calcific atherosclerosis of the carotid siphons.  No skull fracture. The included ocular globes and orbital contents are non-suspicious. The mastoid aircells and included paranasal sinuses are well-aerated.  IMPRESSION: No acute intracranial process; however, moderate white matter changes, advanced for age, may obscure acute ischemia. If clinically indicated, MRI of the brain with diffusion-weighted sequences would be more sensitive.  LEFT occipital encephalomalacia suggest remote ischemic or traumatic etiology.  10 mm LEFT cerebellar calcification can be seen with cavernoma.   Electronically Signed   By: Elon Alas   On: 12/06/2013 22:16   Mri Brain Without Contrast  12/07/2013   CLINICAL DATA:  Headaches. Imbalance, blurred vision. Vertigo. Stroke risk factors include hypertension, hyperlipidemia, and diabetes mellitus.  EXAM: MRI HEAD WITHOUT CONTRAST  MRA HEAD WITHOUT CONTRAST  TECHNIQUE: Multiplanar, multiecho pulse sequences of the brain and surrounding structures were obtained without intravenous contrast. Angiographic images of the head were obtained using MRA technique without contrast.  COMPARISON:  None.  FINDINGS: MRI HEAD FINDINGS  There is a focal area of restricted diffusion in the periaqueductal midbrain on the LEFT extending ventrally for nearly 1 cm, towards the interpeduncular notch, consistent with acute brainstem infarction. No other similar areas are noted.  No acute hemorrhage, hydrocephalus, or extra-axial fluid.  Generalized atrophy. Moderately extensive T2 and FLAIR hyperintensities consistent with chronic microvascular ischemic change. Scattered areas of lacunar infarction.  Gradient sequence demonstrates widespread foci of  microhemorrhage throughout the cerebral hemispheres, brainstem, deep nuclei, and BILATERAL cerebellar hemispheres. This pattern is most commonly associated with longstanding hypertensive cerebrovascular disease, but can be seen as a sequelae of cerebral amyloid angiopathy, less favored due to the lack of visible lobar hemorrhage.  Flow voids are maintained. Cervical spondylosis. Extracranial soft tissues unremarkable.  MRA HEAD FINDINGS  The internal carotid arteries demonstrate minor irregularity there cavernous segments but are otherwise patent without flow reducing lesion. Codominant vertebral arteries without distal V4 segment narrowing.  The RIGHT A1 ACA is the dominant contributor to both anterior cerebrals; the LEFT A1 ACA is diseased but patent.  No proximal M1 MCA stenosis on the RIGHT or LEFT. No MCA branch occlusion.  Both posterior cerebral arteries are patent, with a severe 90% or greater stenosis of the distal P1 segment on the LEFT. No cerebellar branch occlusion. No visible aneurysm. No basilar dissection or stenosis.  IMPRESSION: Focal area of acute midbrain infarction affecting the LEFT periaqueductal gray matter and regional white matter fiber tracts.  Widespread microbleeds throughout the brain, consistent with chronic hypertensive cerebrovascular disease.  Severe 90% or greater stenosis of the distal P1 LEFT PCA segment.   Electronically Signed   By: Rolla Flatten M.D.   On: 12/07/2013 19:29   Mr Jodene Nam Head/brain Wo Cm  12/07/2013   CLINICAL DATA:  Headaches. Imbalance, blurred vision. Vertigo. Stroke risk factors include hypertension, hyperlipidemia, and diabetes mellitus.  EXAM: MRI HEAD WITHOUT CONTRAST  MRA HEAD WITHOUT CONTRAST  TECHNIQUE: Multiplanar, multiecho pulse sequences of the brain and surrounding structures were obtained without intravenous contrast. Angiographic images of the head were obtained using MRA technique without contrast.  COMPARISON:  None.  FINDINGS: MRI HEAD  FINDINGS  There is a focal area of restricted diffusion in the periaqueductal midbrain on the LEFT extending ventrally for nearly 1 cm, towards the interpeduncular notch, consistent with acute brainstem infarction. No other similar areas are noted.  No acute hemorrhage, hydrocephalus, or extra-axial fluid.  Generalized atrophy. Moderately extensive T2 and FLAIR hyperintensities consistent with chronic microvascular ischemic change. Scattered areas of lacunar infarction.  Gradient sequence demonstrates widespread foci of microhemorrhage throughout the cerebral hemispheres, brainstem, deep nuclei, and BILATERAL cerebellar hemispheres. This pattern is most commonly associated with longstanding hypertensive cerebrovascular disease, but can be seen as a sequelae of cerebral amyloid angiopathy, less favored due to the lack of visible lobar hemorrhage.  Flow voids are maintained. Cervical spondylosis. Extracranial soft tissues unremarkable.  MRA HEAD FINDINGS  The internal carotid arteries demonstrate minor irregularity there cavernous segments but are otherwise patent without flow reducing lesion. Codominant vertebral arteries without distal V4 segment narrowing.  The RIGHT A1 ACA is the dominant contributor to both anterior cerebrals; the LEFT A1 ACA is diseased but patent.  No proximal M1 MCA stenosis on the RIGHT or LEFT. No MCA branch occlusion.  Both posterior cerebral arteries are patent, with a severe 90% or greater stenosis of the distal P1 segment on the LEFT. No cerebellar branch occlusion. No visible aneurysm. No basilar dissection or stenosis.  IMPRESSION: Focal area of acute midbrain infarction affecting the LEFT periaqueductal gray matter and regional white matter fiber tracts.  Widespread microbleeds throughout the brain, consistent with chronic hypertensive cerebrovascular disease.  Severe 90% or greater stenosis of the distal P1 LEFT PCA segment.   Electronically Signed   By: Rolla Flatten M.D.   On:  12/07/2013 19:29    Assessment: 63 y.o. male with acute left midbrain infarct. Paresis left medial rectus on exam. Stroke mechanism mist likely due to small vessel disease. Aspirin, adjust statin for LDL goal <70. PT. Complete stroke work up. Stroke team will follow up tomorrow.  Stroke Risk Factors - HTN, DM, hyperlipidemia.  Plan: 1. HgbA1c, fasting lipid panel 2. MRI, MRA  of the brain without contrast 3. Echocardiogram  4. Carotid dopplers 5. Prophylactic therapy-aspirin 6. Risk factor modification 7. Telemetry monitoring 8. Frequent neuro checks 9. PT/OT SLP   Dorian Pod, MD [facility] [phone]  12/08/2013, 8:26 AM

## 2013-12-08 NOTE — Progress Notes (Signed)
STROKE TEAM PROGRESS NOTE   HISTORY James Henderson is an 63 y.o. male with a past medical history significant for HTN, hyperlipidemia, DM, admitted to [facility] for further evaluation of unsteadiness, blurred vision. Patient indicated that on 10/18 he developed acute onset of marked unsteadiness and blurred vision which he never experienced before. This was not associated with vertigo (although listed as a symptom on presentation he denied it today), double vision, difficulty swallowing, focal weakness or numbness, slurred speech, confusion, or language impairment. Symptoms did not improve during the day and he decided to seek medical attention several hours later. He was last known well December 06, 2013 at 9 AM. He was not administered TPA secondary to delay in arrival. He was admitted for further stroke evaluation and treatment.  Initial CT brain here at [facility] revealed no obvious acute abnormality but a subsequent MRI brain disclosed a small focal area of restricted diffusion in the periaqueductal midbrain on the LEFT extending ventrally for nearly 1 cm, towards the interpeduncular notch. Multiple areas of microbleeds noted.  MRA brain: the LEFT A1 ACA is diseased but patent. No proximal M1 MCA stenosis on the RIGHT or LEFT. No MCA branch occlusion. Both posterior cerebral arteries are patent, with severe 90% or greater stenosis of the distal P1 segment on the LEFT. No cerebellar  branch occlusion. No visible aneurysm. No basilar dissection or stenosis.  CUS without hemodynamically significant carotid disease.  Cholesterol 170, triglycerides 185, HDL 33, LDL 100.  Started on aspirin 81 mg daily. Pravachol 20 mg.    SUBJECTIVE (INTERVAL HISTORY) No family is at the bedside.  Patient is sitting up in chair at side of the bed. He is hopeful for discharge soon.   OBJECTIVE Temp:  [97.6 F (36.4 C)-99 F (37.2 C)] 97.6 F (36.4 C) (10/20 1007) Pulse Rate:  [52-62] 58 (10/20 1007) Cardiac Rhythm:  [-]  Sinus bradycardia (10/19 2000) Resp:  [18-22] 20 (10/20 1007) BP: (106-126)/(63-72) 124/65 mmHg (10/20 1007) SpO2:  [99 %-100 %] 100 % (10/20 1007)   Recent Labs Lab 12/07/13 2015 12/07/13 2351 12/08/13 0413 12/08/13 0750 12/08/13 1134  GLUCAP 135* 110* 103* 116* 183*    Recent Labs Lab 12/06/13 2220 12/08/13 0734  NA 141 142  K 4.3 4.0  CL 103 106  CO2 27 24  GLUCOSE 117* 124*  BUN 24* 18  CREATININE 1.59* 1.29  CALCIUM 9.5 8.9    Recent Labs Lab 12/06/13 2220  AST 17  ALT 14  ALKPHOS 108  BILITOT 0.2*  PROT 7.7  ALBUMIN 3.9    Recent Labs Lab 12/06/13 2220 12/08/13 0734  WBC 4.9 4.6  NEUTROABS 2.5  --   HGB 13.5 12.7*  HCT 40.8 37.8*  MCV 91.7 91.5  PLT 219 190    Recent Labs Lab 12/07/13 0510 12/07/13 1139 12/07/13 1730  TROPONINI <0.30 <0.30 <0.30    Recent Labs  12/06/13 2220  LABPROT 14.0  INR 1.07    Recent Labs  12/07/13 0032  COLORURINE YELLOW  LABSPEC 1.018  PHURINE 6.5  GLUCOSEU NEGATIVE  HGBUR NEGATIVE  BILIRUBINUR NEGATIVE  KETONESUR NEGATIVE  PROTEINUR NEGATIVE  UROBILINOGEN 0.2  NITRITE NEGATIVE  LEUKOCYTESUR NEGATIVE       Component Value Date/Time   CHOL 170 12/07/2013 0510   TRIG 185* 12/07/2013 0510   HDL 33* 12/07/2013 0510   CHOLHDL 5.2 12/07/2013 0510   VLDL 37 12/07/2013 0510   LDLCALC 100* 12/07/2013 0510   Lab Results  Component Value Date  HGBA1C 7.3* 12/07/2013   No results found for this basename: labopia, cocainscrnur, labbenz, amphetmu, thcu, labbarb    No results found for this basename: ETH,  in the last 168 hours  Ct Head (brain) Wo Contrast  12/06/2013   CLINICAL DATA:  Dizziness and blurry vision beginning today at 10:30 a.m. mild headache.  EXAM: CT HEAD WITHOUT CONTRAST  TECHNIQUE: Contiguous axial images were obtained from the base of the skull through the vertex without intravenous contrast.  COMPARISON:  None.  FINDINGS: The ventricles and sulci are normal for age. No  intraparenchymal hemorrhage, mass effect nor midline shift. Patchy supratentorial white matter hypodensities are greater than expected for patient's age and though non-specific suggest sequelae of chronic small vessel ischemic disease. No acute large vascular territory infarcts. 10 mm left cerebellar calcification. Mild calcific atherosclerosis with carotid siphons.  No abnormal extra-axial fluid collections. Basal cisterns are patent. Mild calcific atherosclerosis of the carotid siphons.  No skull fracture. The included ocular globes and orbital contents are non-suspicious. The mastoid aircells and included paranasal sinuses are well-aerated.  IMPRESSION: No acute intracranial process; however, moderate white matter changes, advanced for age, may obscure acute ischemia. If clinically indicated, MRI of the brain with diffusion-weighted sequences would be more sensitive.  LEFT occipital encephalomalacia suggest remote ischemic or traumatic etiology.  10 mm LEFT cerebellar calcification can be seen with cavernoma.   Electronically Signed   By: Elon Alas   On: 12/06/2013 22:16   Mri Brain Without Contrast  12/07/2013   CLINICAL DATA:  Headaches. Imbalance, blurred vision. Vertigo. Stroke risk factors include hypertension, hyperlipidemia, and diabetes mellitus.  EXAM: MRI HEAD WITHOUT CONTRAST  MRA HEAD WITHOUT CONTRAST  TECHNIQUE: Multiplanar, multiecho pulse sequences of the brain and surrounding structures were obtained without intravenous contrast. Angiographic images of the head were obtained using MRA technique without contrast.  COMPARISON:  None.  FINDINGS: MRI HEAD FINDINGS  There is a focal area of restricted diffusion in the periaqueductal midbrain on the LEFT extending ventrally for nearly 1 cm, towards the interpeduncular notch, consistent with acute brainstem infarction. No other similar areas are noted.  No acute hemorrhage, hydrocephalus, or extra-axial fluid.  Generalized atrophy. Moderately  extensive T2 and FLAIR hyperintensities consistent with chronic microvascular ischemic change. Scattered areas of lacunar infarction.  Gradient sequence demonstrates widespread foci of microhemorrhage throughout the cerebral hemispheres, brainstem, deep nuclei, and BILATERAL cerebellar hemispheres. This pattern is most commonly associated with longstanding hypertensive cerebrovascular disease, but can be seen as a sequelae of cerebral amyloid angiopathy, less favored due to the lack of visible lobar hemorrhage.  Flow voids are maintained. Cervical spondylosis. Extracranial soft tissues unremarkable.  MRA HEAD FINDINGS  The internal carotid arteries demonstrate minor irregularity there cavernous segments but are otherwise patent without flow reducing lesion. Codominant vertebral arteries without distal V4 segment narrowing.  The RIGHT A1 ACA is the dominant contributor to both anterior cerebrals; the LEFT A1 ACA is diseased but patent.  No proximal M1 MCA stenosis on the RIGHT or LEFT. No MCA branch occlusion.  Both posterior cerebral arteries are patent, with a severe 90% or greater stenosis of the distal P1 segment on the LEFT. No cerebellar branch occlusion. No visible aneurysm. No basilar dissection or stenosis.  IMPRESSION: Focal area of acute midbrain infarction affecting the LEFT periaqueductal gray matter and regional white matter fiber tracts.  Widespread microbleeds throughout the brain, consistent with chronic hypertensive cerebrovascular disease.  Severe 90% or greater stenosis of the distal P1 LEFT  PCA segment.   Electronically Signed   By: Rolla Flatten M.D.   On: 12/07/2013 19:29   Mr Jodene Nam Head/brain Wo Cm  12/07/2013   CLINICAL DATA:  Headaches. Imbalance, blurred vision. Vertigo. Stroke risk factors include hypertension, hyperlipidemia, and diabetes mellitus.  EXAM: MRI HEAD WITHOUT CONTRAST  MRA HEAD WITHOUT CONTRAST  TECHNIQUE: Multiplanar, multiecho pulse sequences of the brain and surrounding  structures were obtained without intravenous contrast. Angiographic images of the head were obtained using MRA technique without contrast.  COMPARISON:  None.  FINDINGS: MRI HEAD FINDINGS  There is a focal area of restricted diffusion in the periaqueductal midbrain on the LEFT extending ventrally for nearly 1 cm, towards the interpeduncular notch, consistent with acute brainstem infarction. No other similar areas are noted.  No acute hemorrhage, hydrocephalus, or extra-axial fluid.  Generalized atrophy. Moderately extensive T2 and FLAIR hyperintensities consistent with chronic microvascular ischemic change. Scattered areas of lacunar infarction.  Gradient sequence demonstrates widespread foci of microhemorrhage throughout the cerebral hemispheres, brainstem, deep nuclei, and BILATERAL cerebellar hemispheres. This pattern is most commonly associated with longstanding hypertensive cerebrovascular disease, but can be seen as a sequelae of cerebral amyloid angiopathy, less favored due to the lack of visible lobar hemorrhage.  Flow voids are maintained. Cervical spondylosis. Extracranial soft tissues unremarkable.  MRA HEAD FINDINGS  The internal carotid arteries demonstrate minor irregularity there cavernous segments but are otherwise patent without flow reducing lesion. Codominant vertebral arteries without distal V4 segment narrowing.  The RIGHT A1 ACA is the dominant contributor to both anterior cerebrals; the LEFT A1 ACA is diseased but patent.  No proximal M1 MCA stenosis on the RIGHT or LEFT. No MCA branch occlusion.  Both posterior cerebral arteries are patent, with a severe 90% or greater stenosis of the distal P1 segment on the LEFT. No cerebellar branch occlusion. No visible aneurysm. No basilar dissection or stenosis.  IMPRESSION: Focal area of acute midbrain infarction affecting the LEFT periaqueductal gray matter and regional white matter fiber tracts.  Widespread microbleeds throughout the brain,  consistent with chronic hypertensive cerebrovascular disease.  Severe 90% or greater stenosis of the distal P1 LEFT PCA segment.   Electronically Signed   By: Rolla Flatten M.D.   On: 12/07/2013 19:29   Carotid Doppler  There is 1-39% bilateral ICA stenosis. Vertebral artery flow is antegrade.    2D Echocardiogram  EF 55-60% with no source of embolus.    PHYSICAL EXAM Middle aged male not in distress.Awake alert. Afebrile. Head is nontraumatic. Neck is supple without bruit. Hearing is normal. Cardiac exam no murmur or gallop. Lungs are clear to auscultation. Distal pulses are well felt. Neurological Exam ;  Awake  Alert oriented x 3. Normal speech and language.eye movements full without nystagmus.fundi were not visualized. Vision acuity and fields appear normal. Hearing is normal. Palatal movements are normal. Face symmetric. Tongue midline. Normal strength, tone, reflexes and coordination. Normal sensation. Gait deferred. ASSESSMENT/PLAN Mr. Victor Seagrave is a 63 y.o. male with history of hypertension, hyperlipidemia and diabetes presenting with unsteadiness, blurred vision. He did not receive IV t-PA due to delay in arrival.   Stroke:  Non-dominant small midbrain infarct secondary to small vessel disease source     MRI  Small midbrain infarct. Widespread microhemorrhages  MRA  Distal L PCA P1 90% stenosis  Carotid Doppler  No significant stenosis   2D Echo  No source of embolus   Heparin 5000 units sq tid for VTE prophylaxis  Carb Control thin liquids  Bathroom privileges with assistance  aspirin 81 mg orally every day prior to admission, now on aspirin 81 mg orally every day. Would continue low dose aspirin given microbleeds on MRI; typically would change to plavix, however and the patient would increase risk for cerebral hemorrhage.  Patient counseled to be compliant with his antithrombotic medications  Risk factor education  Ongoing aggressive risk factor  management  Therapy recommendations:  No PT need, CIR per OT  Disposition:  Anticipate home with outpatient therapy  Hypertension  Stable  Permissive hypertension (OK if < 220/120) but gradually normalize in 5-7 days  Patient counseled to be compliant with his blood pressure medications  Hyperlipidemia  Home meds:  mevacor 20 resumed in hospital as pravachol 20. Also on niacin.  LDL 100 goal < 70  Continue statin at discharge  Diabetes  HgbA1c 7.3 goal < 7.0  Uncontrolled  Other Stroke Risk Factors Advanced age Former Cigarette smoker   Obesity, Body mass index is 28.82 kg/(m^2).    Family hx stroke (mother and brother)  Other Active Problems  glaucoma   No further stroke workup indicated.  Patient has a 10-15% risk of having another stroke over the next year, the highest risk is within 2 weeks of the most recent stroke/TIA (risk of having a stroke following a stroke or TIA is the same).  Ongoing risk factor control by Primary Care Physician  Stroke Service will sign off. Please call should any needs arise.  Follow up with Dr. Leonie Man at [facility] in 1 month, order placed.    Hospital day # 2  Burnetta Sabin, MSN, RN, ANVP-BC, ANP-BC, [facility] Pager: [phone] 12/08/2013 5:58 PM  I have personally examined this patient, reviewed notes, independently viewed imaging studies, participated in medical decision making and plan of care. I have made any additions or clarifications directly to the above note. Agree with note above.   Antony Contras, MD Medical Director [facility] Pager: [phone] 12/08/2013 8:47 PM    To contact Stroke Continuity provider, please refer to [email or web address] After hours, contact General Neurology

## 2013-12-08 NOTE — Progress Notes (Signed)
UR completed 

## 2013-12-09 DIAGNOSIS — I10 Essential (primary) hypertension: Secondary | ICD-10-CM

## 2013-12-09 DIAGNOSIS — H409 Unspecified glaucoma: Secondary | ICD-10-CM

## 2013-12-09 DIAGNOSIS — E1149 Type 2 diabetes mellitus with other diabetic neurological complication: Secondary | ICD-10-CM

## 2013-12-09 DIAGNOSIS — I638 Other cerebral infarction: Secondary | ICD-10-CM

## 2013-12-09 LAB — GLUCOSE, CAPILLARY
Glucose-Capillary: 128 mg/dL — ABNORMAL HIGH (ref 70–99)
Glucose-Capillary: 131 mg/dL — ABNORMAL HIGH (ref 70–99)
Glucose-Capillary: 136 mg/dL — ABNORMAL HIGH (ref 70–99)
Glucose-Capillary: 146 mg/dL — ABNORMAL HIGH (ref 70–99)

## 2013-12-09 LAB — BASIC METABOLIC PANEL
Anion gap: 13 (ref 5–15)
BUN: 18 mg/dL (ref 6–23)
CO2: 24 mEq/L (ref 19–32)
Calcium: 9.1 mg/dL (ref 8.4–10.5)
Chloride: 104 mEq/L (ref 96–112)
Creatinine, Ser: 1.29 mg/dL (ref 0.50–1.35)
GFR calc Af Amer: 66 mL/min — ABNORMAL LOW (ref >90)
GFR calc non Af Amer: 57 mL/min — ABNORMAL LOW (ref >90)
Glucose, Bld: 163 mg/dL — ABNORMAL HIGH (ref 70–99)
Potassium: 4.2 mEq/L (ref 3.7–5.3)
Sodium: 141 mEq/L (ref 137–147)

## 2013-12-09 LAB — CBC
HCT: 38.5 % — ABNORMAL LOW (ref 39.0–52.0)
Hemoglobin: 12.9 g/dL — ABNORMAL LOW (ref 13.0–17.0)
MCH: 31.3 pg (ref 26.0–34.0)
MCHC: 33.5 g/dL (ref 30.0–36.0)
MCV: 93.4 fL (ref 78.0–100.0)
Platelets: 199 10*3/uL (ref 150–400)
RBC: 4.12 MIL/uL — ABNORMAL LOW (ref 4.22–5.81)
RDW: 12.8 % (ref 11.5–15.5)
WBC: 5.1 10*3/uL (ref 4.0–10.5)

## 2013-12-09 MED ORDER — ASPIRIN EC 325 MG PO TBEC
325.0000 mg | DELAYED_RELEASE_TABLET | Freq: Every day | ORAL | Status: DC
  Administered 2013-12-09: 325 mg via ORAL
  Filled 2013-12-09: qty 1

## 2013-12-09 MED ORDER — ASPIRIN EC 81 MG PO TBEC
81.0000 mg | DELAYED_RELEASE_TABLET | Freq: Every day | ORAL | Status: DC
  Administered 2013-12-09: 81 mg via ORAL
  Filled 2013-12-09: qty 1

## 2013-12-09 MED ORDER — UNABLE TO FIND: Status: DC

## 2013-12-09 NOTE — Progress Notes (Signed)
Physical medicine and rehabilitation consult requested with chart reviewed. As her latest physical therapy note 12/08/2013 patient ambulating supervision 300 feet without assistive device versus IV pole and recommendations are made for home health versus outpatient therapies. Patient will not qualify for CIR at this time

## 2013-12-09 NOTE — Discharge Summary (Signed)
Physician Discharge Summary  Patient ID: James Henderson MRN: [id number] DOB/AGE: 1950-08-02 63 y.o.  Admit date: 12/06/2013 Discharge date: 12/09/2013  Primary Care Physician:  No primary provider on file.  Discharge Diagnoses:   . Acute brainstem infarction . Essential hypertension . DM (diabetes mellitus) type II controlled, neurological manifestation . hypertension Hyperlipidemia  Consults:  Neurology/stroke service   Recommendations for Outpatient Follow-up:  Ongoing risk factor control by PCP  Allergies:  No Known Allergies   Discharge Medications:   Medication List         aspirin EC 81 MG tablet  Take 81 mg by mouth daily.     Fish Oil 1200 MG Caps  Take 1 capsule by mouth 2 (two) times daily.     fluticasone 50 MCG/ACT nasal spray  Commonly known as:  FLONASE  Place 1 spray into both nostrils daily as needed for allergies or rhinitis.     hydrochlorothiazide 25 MG tablet  Commonly known as:  HYDRODIURIL  Take 12.5 mg by mouth daily.     lovastatin 20 MG tablet  Commonly known as:  MEVACOR  Take 20 mg by mouth at bedtime.     metFORMIN 500 MG (MOD) 24 hr tablet  Commonly known as:  GLUMETZA  Take 500 mg by mouth daily with breakfast.     niacin 500 MG tablet  Take 500 mg by mouth 2 (two) times daily with a meal.     SIMBRINZA 1-0.2 % Susp  Generic drug:  Brinzolamide-Brimonidine  Place 1 drop into both eyes 3 (three) times daily.     SYSTANE ULTRA 0.4-0.3 % Soln  Generic drug:  Polyethyl Glycol-Propyl Glycol  Place 1 drop into both eyes daily as needed (dryness or itching).     timolol 0.5 % ophthalmic solution  Commonly known as:  BETIMOL  Place 1 drop into both eyes every morning.     travoprost (benzalkonium) 0.004 % ophthalmic solution  Commonly known as:  TRAVATAN  Place 1 drop into both eyes at bedtime.     UNABLE TO FIND  - Outpatient physical therapy  -   -   - Diagnosis: Acute CVA/stroke     Vitamin D-3 5000 UNITS Tabs   Take 5,000 Units by mouth daily.         Brief H and P: For complete details please refer to admission H and P, but in brief the patient is a 63 year old male with history of hypertension, hyperlipidemia, diabetes presented with blurred vision and vertigo. Patient reported that around 10:30 AM on the day of admission, he started to feel off balance and had some blurry vision. Revisions subsequently resolved but the vertigo with ambulation persisted. CT head in ED showed no acute intracranial process. Patient was admitted for stroke workup.  Hospital Course:  Patient admitted for blurry vision, unsteady gait, vertigo, CT head did not show any acute findings, but did show evidence of possible old ischemic disease, patient admitted for further workup and evaluation.  MRI brain showed a small focal area of restricted diffusion in the periaqueductal midbrain on the LEFT extending ventrally for nearly 1 cm, towards the interpeduncular notch. Multiple areas of microbleeds noted.  Acute CVA  -MRI brain revealed a small focal area of restricted diffusion in the periaqueductal midbrain on the LEFT extending ventrally for nearly 1 cm, towards the interpeduncular notch. Multiple areas of microbleeds noted. Neurology was consulted. Patient was on aspirin prior to admission however due to multiple areas of microbleeds, neurology/Dr. Leonie Man  recommended continuing aspirin 81 mg daily due to risk for hemorrhage. Patient was continued on aspirin and statin. PT OT recommended CIR however patient improved significantly and hence was declined by inpatient rehabilitation. Home health PT OT was arranged. - 2-D echo showing EF 55 %, and grade 1 diastolic dysfunction. Carotid Doppler showed 1-39% ICA stenosis.  Diabetes mellitus  hemoglobin A1c 7.1, continue metformin and ongoing risk factor control  Hyperlipidemia -cont with niacin and statin   Glaucoma  -Continue with home medication   Day of Discharge BP  111/68  Pulse 70  Temp(Src) 98.6 F (37 C) (Oral)  Resp 20  Ht 5\' 4"  (1.626 m)  Wt 76.204 kg (168 lb)  BMI 28.82 kg/m2  SpO2 98%  Physical Exam: General: Alert and awake oriented x3 not in any acute distress. CVS: S1-S2 clear no murmur rubs or gallops Chest: clear to auscultation bilaterally, no wheezing rales or rhonchi Abdomen: soft nontender, nondistended, normal bowel sounds Extremities: no cyanosis, clubbing or edema noted bilaterally    The results of significant diagnostics from this hospitalization (including imaging, microbiology, ancillary and laboratory) are listed below for reference.    LAB RESULTS: Basic Metabolic Panel:  Recent Labs Lab 12/08/13 0734 12/09/13 0449  NA 142 141  K 4.0 4.2  CL 106 104  CO2 24 24  GLUCOSE 124* 163*  BUN 18 18  CREATININE 1.29 1.29  CALCIUM 8.9 9.1   Liver Function Tests:  Recent Labs Lab 12/06/13 2220  AST 17  ALT 14  ALKPHOS 108  BILITOT 0.2*  PROT 7.7  ALBUMIN 3.9   No results found for this basename: LIPASE, AMYLASE,  in the last 168 hours No results found for this basename: AMMONIA,  in the last 168 hours CBC:  Recent Labs Lab 12/06/13 2220 12/08/13 0734 12/09/13 0449  WBC 4.9 4.6 5.1  NEUTROABS 2.5  --   --   HGB 13.5 12.7* 12.9*  HCT 40.8 37.8* 38.5*  MCV 91.7 91.5 93.4  PLT 219 190 199   Cardiac Enzymes:  Recent Labs Lab 12/07/13 1139 12/07/13 1730  TROPONINI <0.30 <0.30   BNP: No components found with this basename: POCBNP,  CBG:  Recent Labs Lab 12/09/13 0802 12/09/13 1150  GLUCAP 136* 128*    Significant Diagnostic Studies:  Ct Head (brain) Wo Contrast  12/06/2013   CLINICAL DATA:  Dizziness and blurry vision beginning today at 10:30 a.m. mild headache.  EXAM: CT HEAD WITHOUT CONTRAST  TECHNIQUE: Contiguous axial images were obtained from the base of the skull through the vertex without intravenous contrast.  COMPARISON:  None.  FINDINGS: The ventricles and sulci are normal  for age. No intraparenchymal hemorrhage, mass effect nor midline shift. Patchy supratentorial white matter hypodensities are greater than expected for patient's age and though non-specific suggest sequelae of chronic small vessel ischemic disease. No acute large vascular territory infarcts. 10 mm left cerebellar calcification. Mild calcific atherosclerosis with carotid siphons.  No abnormal extra-axial fluid collections. Basal cisterns are patent. Mild calcific atherosclerosis of the carotid siphons.  No skull fracture. The included ocular globes and orbital contents are non-suspicious. The mastoid aircells and included paranasal sinuses are well-aerated.  IMPRESSION: No acute intracranial process; however, moderate white matter changes, advanced for age, may obscure acute ischemia. If clinically indicated, MRI of the brain with diffusion-weighted sequences would be more sensitive.  LEFT occipital encephalomalacia suggest remote ischemic or traumatic etiology.  10 mm LEFT cerebellar calcification can be seen with cavernoma.   Electronically Signed  By: Elon Alas   On: 12/06/2013 22:16   Mri Brain Without Contrast  12/07/2013   CLINICAL DATA:  Headaches. Imbalance, blurred vision. Vertigo. Stroke risk factors include hypertension, hyperlipidemia, and diabetes mellitus.  EXAM: MRI HEAD WITHOUT CONTRAST  MRA HEAD WITHOUT CONTRAST  TECHNIQUE: Multiplanar, multiecho pulse sequences of the brain and surrounding structures were obtained without intravenous contrast. Angiographic images of the head were obtained using MRA technique without contrast.  COMPARISON:  None.  FINDINGS: MRI HEAD FINDINGS  There is a focal area of restricted diffusion in the periaqueductal midbrain on the LEFT extending ventrally for nearly 1 cm, towards the interpeduncular notch, consistent with acute brainstem infarction. No other similar areas are noted.  No acute hemorrhage, hydrocephalus, or extra-axial fluid.  Generalized  atrophy. Moderately extensive T2 and FLAIR hyperintensities consistent with chronic microvascular ischemic change. Scattered areas of lacunar infarction.  Gradient sequence demonstrates widespread foci of microhemorrhage throughout the cerebral hemispheres, brainstem, deep nuclei, and BILATERAL cerebellar hemispheres. This pattern is most commonly associated with longstanding hypertensive cerebrovascular disease, but can be seen as a sequelae of cerebral amyloid angiopathy, less favored due to the lack of visible lobar hemorrhage.  Flow voids are maintained. Cervical spondylosis. Extracranial soft tissues unremarkable.  MRA HEAD FINDINGS  The internal carotid arteries demonstrate minor irregularity there cavernous segments but are otherwise patent without flow reducing lesion. Codominant vertebral arteries without distal V4 segment narrowing.  The RIGHT A1 ACA is the dominant contributor to both anterior cerebrals; the LEFT A1 ACA is diseased but patent.  No proximal M1 MCA stenosis on the RIGHT or LEFT. No MCA branch occlusion.  Both posterior cerebral arteries are patent, with a severe 90% or greater stenosis of the distal P1 segment on the LEFT. No cerebellar branch occlusion. No visible aneurysm. No basilar dissection or stenosis.  IMPRESSION: Focal area of acute midbrain infarction affecting the LEFT periaqueductal gray matter and regional white matter fiber tracts.  Widespread microbleeds throughout the brain, consistent with chronic hypertensive cerebrovascular disease.  Severe 90% or greater stenosis of the distal P1 LEFT PCA segment.   Electronically Signed   By: Rolla Flatten M.D.   On: 12/07/2013 19:29   Mr Jodene Nam Head/brain Wo Cm  12/07/2013   CLINICAL DATA:  Headaches. Imbalance, blurred vision. Vertigo. Stroke risk factors include hypertension, hyperlipidemia, and diabetes mellitus.  EXAM: MRI HEAD WITHOUT CONTRAST  MRA HEAD WITHOUT CONTRAST  TECHNIQUE: Multiplanar, multiecho pulse sequences of the  brain and surrounding structures were obtained without intravenous contrast. Angiographic images of the head were obtained using MRA technique without contrast.  COMPARISON:  None.  FINDINGS: MRI HEAD FINDINGS  There is a focal area of restricted diffusion in the periaqueductal midbrain on the LEFT extending ventrally for nearly 1 cm, towards the interpeduncular notch, consistent with acute brainstem infarction. No other similar areas are noted.  No acute hemorrhage, hydrocephalus, or extra-axial fluid.  Generalized atrophy. Moderately extensive T2 and FLAIR hyperintensities consistent with chronic microvascular ischemic change. Scattered areas of lacunar infarction.  Gradient sequence demonstrates widespread foci of microhemorrhage throughout the cerebral hemispheres, brainstem, deep nuclei, and BILATERAL cerebellar hemispheres. This pattern is most commonly associated with longstanding hypertensive cerebrovascular disease, but can be seen as a sequelae of cerebral amyloid angiopathy, less favored due to the lack of visible lobar hemorrhage.  Flow voids are maintained. Cervical spondylosis. Extracranial soft tissues unremarkable.  MRA HEAD FINDINGS  The internal carotid arteries demonstrate minor irregularity there cavernous segments but are otherwise patent without  flow reducing lesion. Codominant vertebral arteries without distal V4 segment narrowing.  The RIGHT A1 ACA is the dominant contributor to both anterior cerebrals; the LEFT A1 ACA is diseased but patent.  No proximal M1 MCA stenosis on the RIGHT or LEFT. No MCA branch occlusion.  Both posterior cerebral arteries are patent, with a severe 90% or greater stenosis of the distal P1 segment on the LEFT. No cerebellar branch occlusion. No visible aneurysm. No basilar dissection or stenosis.  IMPRESSION: Focal area of acute midbrain infarction affecting the LEFT periaqueductal gray matter and regional white matter fiber tracts.  Widespread microbleeds  throughout the brain, consistent with chronic hypertensive cerebrovascular disease.  Severe 90% or greater stenosis of the distal P1 LEFT PCA segment.   Electronically Signed   By: Rolla Flatten M.D.   On: 12/07/2013 19:29    2D ECHO: Study Conclusions  - Left ventricle: The cavity size was normal. Wall thickness was normal. Systolic function was normal. The estimated ejection fraction was in the range of 55% to 60%. Doppler parameters are consistent with abnormal left ventricular relaxation (grade 1 diastolic dysfunction). - Left atrium: The atrium was mildly dilated.    Disposition and Follow-up:     Discharge Instructions   Ambulatory referral to Neurology    Complete by:  As directed   Stroke patient. Dr. Leonie Man prefers follow up in 1 month     Diet Carb Modified    Complete by:  As directed      Increase activity slowly    Complete by:  As directed             DISPOSITION: Home  DIET carb modified diet   DISCHARGE FOLLOW-UP Follow-up Information   Follow up with SETHI,PRAMOD, MD In 1 month. (Stroke [facility], Office will call you with appointment date & time)    Specialties:  Neurology, Radiology   Contact information:   [place] [address] [phone]       Follow up with [place].   Contact information:   [address] [place] [address] [phone]       Time spent on Discharge: 38 minutes  Signed:   Lilibeth Opie M.D. [facility] 12/09/2013, 12:19 PM Pager: [phone]

## 2013-12-09 NOTE — Progress Notes (Signed)
Talked to patient about DCP, lives with spouse, patient does not want any outpatient therapy and request for the physical therapist come to his home at discharge; [facility] choices given, patient chose [facility]. Amy with [facility] called for arrangements; [phone]

## 2013-12-09 NOTE — Progress Notes (Signed)
1220 - discharge instructions given to pt. Pt verbally acknowledged understanding. Pt voiced no questions when prompted. Pt to be transported home per private car by wife. Pt to be transported per wheelchair to lobby by volunteer. No distress noted. Will monitor   James Henderson I 12/09/2013 12:34 PM

## 2014-01-06 ENCOUNTER — Ambulatory Visit (INDEPENDENT_AMBULATORY_CARE_PROVIDER_SITE_OTHER): Payer: Medicare HMO | Admitting: Neurology

## 2014-01-06 ENCOUNTER — Encounter: Payer: Self-pay | Admitting: Neurology

## 2014-01-06 VITALS — BP 122/72 | HR 59 | Ht 65.25 in | Wt 174.0 lb

## 2014-01-06 DIAGNOSIS — E1149 Type 2 diabetes mellitus with other diabetic neurological complication: Secondary | ICD-10-CM

## 2014-01-06 DIAGNOSIS — I10 Essential (primary) hypertension: Secondary | ICD-10-CM

## 2014-01-06 DIAGNOSIS — E785 Hyperlipidemia, unspecified: Secondary | ICD-10-CM

## 2014-01-06 DIAGNOSIS — I638 Other cerebral infarction: Secondary | ICD-10-CM

## 2014-01-06 DIAGNOSIS — I6389 Other cerebral infarction: Secondary | ICD-10-CM

## 2014-01-06 NOTE — Patient Instructions (Signed)
-   continue ASA and statin for stroke prevention - check BP and glucose at home, record and bring over the Dr. Criss Rosales for medication adjustment - Follow up with your primary care physician for stroke risk factor modification. Recommend maintain blood pressure goal <130/80, diabetes with hemoglobin A1c goal below 6.5% and lipids with LDL cholesterol goal below 70 mg/dL.  - you have appoiintment with Dr. Criss Rosales next week and do not miss the appointment.  - encourage exercises, 3-4 times a week and 30-71min each with breaking out sweat - no restriction for driving, your family needs to ride with your 2-3 times and if both parties are comfortable for your driving then you can drive alone - recommend to return to work whenever you feel comfortable - follow up in 3 months.

## 2014-01-09 NOTE — Progress Notes (Signed)
STROKE NEUROLOGY FOLLOW UP NOTE  NAME: James Henderson DOB: [birth year]  REASON FOR VISIT: stroke follow up HISTORY FROM: pt and chart  Today we had the pleasure of seeing James Henderson in follow-up at our Neurology Clinic. Pt was accompanied by wife.   History Summary James Henderson is an 63 y.o. male with a PMH significant for HTN, hyperlipidemia, DM, admitted to [facility] on 12/06/13 for evaluation of unsteadiness, blurred vision. MRI showed small midbrain infarct secondary to small vessel disease but widespread microbleeds. He was continued on baby ASA and statin with niacin. He was discharged in good condition.  Interval History During the interval time, the patient has been doing well. His BP today in clinic is 122/72. However, he stated that his glucose is not in good control, ranging from 151-220. He felt both of his feet numbness and tingling at the bottom of the feet, R>L. He has appointment with PCP next week.    REVIEW OF SYSTEMS: Full 14 system review of systems performed and notable only for those listed below and in HPI above, all others are negative:  Constitutional: N/A  Cardiovascular: N/A  Ear/Nose/Throat: N/A  Skin: N/A  Eyes: N/A  Respiratory: N/A  Gastroitestinal: N/A  Genitourinary: N/A Hematology/Lymphatic: N/A  Endocrine: N/A  Musculoskeletal: N/A  Allergy/Immunology: allergies  Neurological: N/A  Psychiatric: N/A  The following represents the patient's updated allergies and side effects list: No Known Allergies  Labs since last visit of relevance include the following: Results for orders placed or performed during the hospital encounter of 12/06/13  Culture, Urine  Result Value Ref Range   Specimen Description URINE, RANDOM    Special Requests ADDED 0152    Culture  Setup Time      12/07/2013 08:52 Performed at [place] Performed at [facility]    Culture NO GROWTH Performed at [facility]    Report Status 12/08/2013 FINAL   Protime-INR  Result Value Ref Range   Prothrombin Time 14.0 11.6 - 15.2 seconds   INR 1.07 0.00 - 1.49  APTT  Result Value Ref Range   aPTT 29 24 - 37 seconds  CBC  Result Value Ref Range   WBC 4.9 4.0 - 10.5 K/uL   RBC 4.45 4.22 - 5.81 MIL/uL   Hemoglobin 13.5 13.0 - 17.0 g/dL   HCT 40.8 39.0 - 52.0 %   MCV 91.7 78.0 - 100.0 fL   MCH 30.3 26.0 - 34.0 pg   MCHC 33.1 30.0 - 36.0 g/dL   RDW 12.8 11.5 - 15.5 %   Platelets 219 150 - 400 K/uL  Differential  Result Value Ref Range   Neutrophils Relative % 51 43 - 77 %   Neutro Abs 2.5 1.7 - 7.7 K/uL   Lymphocytes Relative 36 12 - 46 %   Lymphs Abs 1.8 0.7 - 4.0 K/uL   Monocytes Relative 9 3 - 12 %   Monocytes Absolute 0.4 0.1 - 1.0 K/uL   Eosinophils Relative 4 0 - 5 %   Eosinophils Absolute 0.2 0.0 - 0.7 K/uL   Basophils Relative 0 0 - 1 %   Basophils Absolute 0.0 0.0 - 0.1 K/uL  Comprehensive metabolic panel  Result Value Ref Range   Sodium 141 137 - 147 mEq/L   Potassium 4.3 3.7 - 5.3 mEq/L   Chloride 103 96 - 112 mEq/L   CO2 27 19 - 32 mEq/L   Glucose, Bld 117 (H) 70 -  99 mg/dL   BUN 24 (H) 6 - 23 mg/dL   Creatinine, Ser 1.59 (H) 0.50 - 1.35 mg/dL   Calcium 9.5 8.4 - 10.5 mg/dL   Total Protein 7.7 6.0 - 8.3 g/dL   Albumin 3.9 3.5 - 5.2 g/dL   AST 17 0 - 37 U/L   ALT 14 0 - 53 U/L   Alkaline Phosphatase 108 39 - 117 U/L   Total Bilirubin 0.2 (L) 0.3 - 1.2 mg/dL   GFR calc non Af Amer 44 (L) >90 mL/min   GFR calc Af Amer 51 (L) >90 mL/min   Anion gap 11 5 - 15  Urinalysis, Routine w reflex microscopic  Result Value Ref Range   Color, Urine YELLOW YELLOW   APPearance CLOUDY (A) CLEAR   Specific Gravity, Urine 1.018 1.005 - 1.030   pH 6.5 5.0 - 8.0   Glucose, UA NEGATIVE NEGATIVE mg/dL   Hgb urine dipstick NEGATIVE NEGATIVE   Bilirubin Urine NEGATIVE NEGATIVE   Ketones, ur NEGATIVE NEGATIVE mg/dL   Protein, ur NEGATIVE NEGATIVE mg/dL   Urobilinogen, UA 0.2 0.0 - 1.0 mg/dL    Nitrite NEGATIVE NEGATIVE   Leukocytes, UA NEGATIVE NEGATIVE  Hemoglobin A1c  Result Value Ref Range   Hgb A1c MFr Bld 7.3 (H) <5.7 %   Mean Plasma Glucose 163 (H) <117 mg/dL  Fasting lipid panel  Result Value Ref Range   Cholesterol 170 0 - 200 mg/dL   Triglycerides 185 (H) <150 mg/dL   HDL 33 (L) >39 mg/dL   Total CHOL/HDL Ratio 5.2 RATIO   VLDL 37 0 - 40 mg/dL   LDL Cholesterol 100 (H) 0 - 99 mg/dL  Sodium, urine, random  Result Value Ref Range   Sodium, Ur 145 mEq/L  Creatinine, urine, random  Result Value Ref Range   Creatinine, Urine 144.58 mg/dL  Troponin I (q 6hr x 3)  Result Value Ref Range   Troponin I <0.30 <0.30 ng/mL  Troponin I (q 6hr x 3)  Result Value Ref Range   Troponin I <0.30 <0.30 ng/mL  Troponin I (q 6hr x 3)  Result Value Ref Range   Troponin I <0.30 <0.30 ng/mL  Glucose, capillary  Result Value Ref Range   Glucose-Capillary 107 (H) 70 - 99 mg/dL   Comment 1 Documented in Chart    Comment 2 Notify RN   Glucose, capillary  Result Value Ref Range   Glucose-Capillary 113 (H) 70 - 99 mg/dL   Comment 1 Documented in Chart    Comment 2 Notify RN   Glucose, capillary  Result Value Ref Range   Glucose-Capillary 121 (H) 70 - 99 mg/dL   Comment 1 Notify RN    Comment 2 Documented in Chart   CBC  Result Value Ref Range   WBC 4.6 4.0 - 10.5 K/uL   RBC 4.13 (L) 4.22 - 5.81 MIL/uL   Hemoglobin 12.7 (L) 13.0 - 17.0 g/dL   HCT 37.8 (L) 39.0 - 52.0 %   MCV 91.5 78.0 - 100.0 fL   MCH 30.8 26.0 - 34.0 pg   MCHC 33.6 30.0 - 36.0 g/dL   RDW 12.8 11.5 - 15.5 %   Platelets 190 150 - 400 K/uL  Basic metabolic panel  Result Value Ref Range   Sodium 142 137 - 147 mEq/L   Potassium 4.0 3.7 - 5.3 mEq/L   Chloride 106 96 - 112 mEq/L   CO2 24 19 - 32 mEq/L   Glucose, Bld 124 (H) 70 -  99 mg/dL   BUN 18 6 - 23 mg/dL   Creatinine, Ser 1.29 0.50 - 1.35 mg/dL   Calcium 8.9 8.4 - 10.5 mg/dL   GFR calc non Af Amer 57 (L) >90 mL/min   GFR calc Af Amer 66 (L) >90  mL/min   Anion gap 12 5 - 15  Glucose, capillary  Result Value Ref Range   Glucose-Capillary 98 70 - 99 mg/dL   Comment 1 Documented in Chart    Comment 2 Notify RN   Glucose, capillary  Result Value Ref Range   Glucose-Capillary 135 (H) 70 - 99 mg/dL  Glucose, capillary  Result Value Ref Range   Glucose-Capillary 110 (H) 70 - 99 mg/dL   Comment 1 Documented in Chart    Comment 2 Notify RN   Glucose, capillary  Result Value Ref Range   Glucose-Capillary 103 (H) 70 - 99 mg/dL   Comment 1 Notify RN    Comment 2 Documented in Chart   Glucose, capillary  Result Value Ref Range   Glucose-Capillary 116 (H) 70 - 99 mg/dL  Glucose, capillary  Result Value Ref Range   Glucose-Capillary 183 (H) 70 - 99 mg/dL  Hemoglobin A1c  Result Value Ref Range   Hgb A1c MFr Bld 7.1 (H) <5.7 %   Mean Plasma Glucose 157 (H) <117 mg/dL  Glucose, capillary  Result Value Ref Range   Glucose-Capillary 115 (H) 70 - 99 mg/dL  CBC  Result Value Ref Range   WBC 5.1 4.0 - 10.5 K/uL   RBC 4.12 (L) 4.22 - 5.81 MIL/uL   Hemoglobin 12.9 (L) 13.0 - 17.0 g/dL   HCT 38.5 (L) 39.0 - 52.0 %   MCV 93.4 78.0 - 100.0 fL   MCH 31.3 26.0 - 34.0 pg   MCHC 33.5 30.0 - 36.0 g/dL   RDW 12.8 11.5 - 15.5 %   Platelets 199 150 - 400 K/uL  Basic metabolic panel  Result Value Ref Range   Sodium 141 137 - 147 mEq/L   Potassium 4.2 3.7 - 5.3 mEq/L   Chloride 104 96 - 112 mEq/L   CO2 24 19 - 32 mEq/L   Glucose, Bld 163 (H) 70 - 99 mg/dL   BUN 18 6 - 23 mg/dL   Creatinine, Ser 1.29 0.50 - 1.35 mg/dL   Calcium 9.1 8.4 - 10.5 mg/dL   GFR calc non Af Amer 57 (L) >90 mL/min   GFR calc Af Amer 66 (L) >90 mL/min   Anion gap 13 5 - 15  Glucose, capillary  Result Value Ref Range   Glucose-Capillary 141 (H) 70 - 99 mg/dL   Comment 1 Documented in Chart    Comment 2 Notify RN   Glucose, capillary  Result Value Ref Range   Glucose-Capillary 131 (H) 70 - 99 mg/dL   Comment 1 Documented in Chart    Comment 2 Notify RN     Glucose, capillary  Result Value Ref Range   Glucose-Capillary 146 (H) 70 - 99 mg/dL   Comment 1 Documented in Chart    Comment 2 Notify RN   Glucose, capillary  Result Value Ref Range   Glucose-Capillary 136 (H) 70 - 99 mg/dL   Comment 1 Notify RN    Comment 2 Documented in Chart   Glucose, capillary  Result Value Ref Range   Glucose-Capillary 128 (H) 70 - 99 mg/dL   Comment 1 Notify RN    Comment 2 Documented in Chart   CBG monitoring, ED  Result Value Ref Range   Glucose-Capillary 104 (H) 70 - 99 mg/dL  I-stat troponin, ED (not at [facility])  Result Value Ref Range   Troponin i, poc 0.01 0.00 - 0.08 ng/mL   Comment 3            The neurologically relevant items on the patient's problem list were reviewed on today's visit.  Neurologic Examination  A problem focused neurological exam (12 or more points of the single system neurologic examination, vital signs counts as 1 point, cranial nerves count for 8 points) was performed.  Blood pressure 122/72, pulse 59, height 5' 5.25" (1.657 m), weight 174 lb (78.926 kg).  General - Well nourished, well developed, in no apparent distress.  Ophthalmologic - not able to see through.  Cardiovascular - Regular rate and rhythm with no murmur.  Mental Status -  Level of arousal and orientation to time, place, and person were intact. Language including expression, naming, repetition, comprehension was assessed and found intact.  Cranial Nerves II - XII - II - Visual field intact OU. III, IV, VI - Extraocular movements intact. V - Facial sensation intact bilaterally. VII - Facial movement intact bilaterally. VIII - Hearing & vestibular intact bilaterally. X - Palate elevates symmetrically. XI - Chin turning & shoulder shrug intact bilaterally. XII - Tongue protrusion intact.  Motor Strength - The patient's strength was normal in all extremities and pronator drift was absent.  Bulk was normal and fasciculations were absent.   Motor  Tone - Muscle tone was assessed at the neck and appendages and was normal.  Reflexes - The patient's reflexes were normal in all extremities and he had no pathological reflexes.  Sensory - Light touch, temperature/pinprick, vibration and proprioception, and Romberg testing were assessed and were normal.    Coordination - The patient had normal movements in the hands and feet with no ataxia or dysmetria.  Tremor was absent.  Gait and Station - The patient's transfers, posture, gait, station, and turns were observed as normal.  Data reviewed: I personally reviewed the images and agree with the radiology interpretations.  Carotid Doppler There is 1-39% bilateral ICA stenosis. Vertebral artery flow is antegrade.   2D Echocardiogram EF 55-60% with no source of embolus.   MRI brain and MRA head Focal area of acute midbrain infarction affecting the LEFT periaqueductal gray matter and regional white matter fiber tracts. Widespread microbleeds throughout the brain, consistent with chronic hypertensive cerebrovascular disease. Severe 90% or greater stenosis of the distal P1 LEFT PCA segment.  Component     Latest Ref Rng 12/07/2013 12/08/2013  Cholesterol     0 - 200 mg/dL 170   Triglycerides     <150 mg/dL 185 (H)   HDL     >39 mg/dL 33 (L)   Total CHOL/HDL Ratio      5.2   VLDL     0 - 40 mg/dL 37   LDL (calc)     0 - 99 mg/dL 100 (H)   Hgb A1c MFr Bld     <5.7 % 7.3 (H) 7.1 (H)  Mean Plasma Glucose     <117 mg/dL 163 (H) 157 (H)    Assessment: As you may recall, he is a 63 y.o. African American male with PMH of  HTN, hyperlipidemia, DM, admitted to [facility] on 12/06/13 for small midbrain infarct. MRA also showed widespread microbleeds, likely hypertensive but CAA not able to ruled out. He is on ASA 81mg  now, will not escalate antiplatelet  due to extensive microbleeds. His glucose still not in good control. He has appointment with PCP next week. He needs close follow up with PCP for  stroke risk factor control.   Plan:  - continue ASA and lovastatin for stroke prevention - check BP and glucose at home - Follow up with your primary care physician for stroke risk factor modification. Recommend maintain blood pressure goal <130/80, diabetes with hemoglobin A1c goal below 6.5% and lipids with LDL cholesterol goal below 70 mg/dL.  - regular exercise - no escalation of antiplatelet due to widespread microbleds - RTC in 3 months.  No orders of the defined types were placed in this encounter.    Meds ordered this encounter  Medications  . DISCONTD: metFORMIN (GLUCOPHAGE-XR) 500 MG 24 hr tablet    Sig:     Patient Instructions  - continue ASA and statin for stroke prevention - check BP and glucose at home, record and bring over the Dr. Criss Rosales for medication adjustment - Follow up with your primary care physician for stroke risk factor modification. Recommend maintain blood pressure goal <130/80, diabetes with hemoglobin A1c goal below 6.5% and lipids with LDL cholesterol goal below 70 mg/dL.  - you have appoiintment with Dr. Criss Rosales next week and do not miss the appointment.  - encourage exercises, 3-4 times a week and 30-41min each with breaking out sweat - no restriction for driving, your family needs to ride with your 2-3 times and if both parties are comfortable for your driving then you can drive alone - recommend to return to work whenever you feel comfortable - follow up in 3 months.   Rosalin Hawking, MD PhD [facility] [address] [place], [place] [address] [phone]

## 2014-01-10 DIAGNOSIS — E785 Hyperlipidemia, unspecified: Secondary | ICD-10-CM | POA: Insufficient documentation

## 2014-04-12 ENCOUNTER — Ambulatory Visit: Payer: Medicare HMO | Admitting: Neurology

## 2014-04-13 ENCOUNTER — Encounter: Payer: Self-pay | Admitting: Neurology

## 2015-04-06 DIAGNOSIS — E119 Type 2 diabetes mellitus without complications: Secondary | ICD-10-CM | POA: Diagnosis not present

## 2015-04-06 DIAGNOSIS — H402232 Chronic angle-closure glaucoma, bilateral, moderate stage: Secondary | ICD-10-CM | POA: Diagnosis not present

## 2015-04-06 DIAGNOSIS — H2513 Age-related nuclear cataract, bilateral: Secondary | ICD-10-CM | POA: Diagnosis not present

## 2015-04-06 DIAGNOSIS — H35033 Hypertensive retinopathy, bilateral: Secondary | ICD-10-CM | POA: Diagnosis not present

## 2015-05-11 DIAGNOSIS — E782 Mixed hyperlipidemia: Secondary | ICD-10-CM | POA: Diagnosis not present

## 2015-05-11 DIAGNOSIS — I1310 Hypertensive heart and chronic kidney disease without heart failure, with stage 1 through stage 4 chronic kidney disease, or unspecified chronic kidney disease: Secondary | ICD-10-CM | POA: Diagnosis not present

## 2015-05-11 DIAGNOSIS — E089 Diabetes mellitus due to underlying condition without complications: Secondary | ICD-10-CM | POA: Diagnosis not present

## 2015-05-11 DIAGNOSIS — I10 Essential (primary) hypertension: Secondary | ICD-10-CM | POA: Diagnosis not present

## 2015-05-24 DIAGNOSIS — H2513 Age-related nuclear cataract, bilateral: Secondary | ICD-10-CM | POA: Diagnosis not present

## 2015-05-24 DIAGNOSIS — H524 Presbyopia: Secondary | ICD-10-CM | POA: Diagnosis not present

## 2015-05-24 DIAGNOSIS — H52223 Regular astigmatism, bilateral: Secondary | ICD-10-CM | POA: Diagnosis not present

## 2015-05-24 DIAGNOSIS — H18413 Arcus senilis, bilateral: Secondary | ICD-10-CM | POA: Diagnosis not present

## 2015-05-24 DIAGNOSIS — H5203 Hypermetropia, bilateral: Secondary | ICD-10-CM | POA: Diagnosis not present

## 2015-05-24 DIAGNOSIS — H04123 Dry eye syndrome of bilateral lacrimal glands: Secondary | ICD-10-CM | POA: Diagnosis not present

## 2015-05-24 DIAGNOSIS — H11423 Conjunctival edema, bilateral: Secondary | ICD-10-CM | POA: Diagnosis not present

## 2015-05-24 DIAGNOSIS — H25043 Posterior subcapsular polar age-related cataract, bilateral: Secondary | ICD-10-CM | POA: Diagnosis not present

## 2015-05-24 DIAGNOSIS — H11153 Pinguecula, bilateral: Secondary | ICD-10-CM | POA: Diagnosis not present

## 2015-05-24 DIAGNOSIS — H25013 Cortical age-related cataract, bilateral: Secondary | ICD-10-CM | POA: Diagnosis not present

## 2015-05-24 DIAGNOSIS — H401111 Primary open-angle glaucoma, right eye, mild stage: Secondary | ICD-10-CM | POA: Diagnosis not present

## 2015-05-24 DIAGNOSIS — H401122 Primary open-angle glaucoma, left eye, moderate stage: Secondary | ICD-10-CM | POA: Diagnosis not present

## 2015-06-01 DIAGNOSIS — H2513 Age-related nuclear cataract, bilateral: Secondary | ICD-10-CM | POA: Diagnosis not present

## 2015-06-01 DIAGNOSIS — H402232 Chronic angle-closure glaucoma, bilateral, moderate stage: Secondary | ICD-10-CM | POA: Diagnosis not present

## 2015-06-01 DIAGNOSIS — H35033 Hypertensive retinopathy, bilateral: Secondary | ICD-10-CM | POA: Diagnosis not present

## 2015-07-14 DIAGNOSIS — H2511 Age-related nuclear cataract, right eye: Secondary | ICD-10-CM | POA: Diagnosis not present

## 2015-07-14 DIAGNOSIS — H5703 Miosis: Secondary | ICD-10-CM | POA: Diagnosis not present

## 2015-07-26 DIAGNOSIS — E119 Type 2 diabetes mellitus without complications: Secondary | ICD-10-CM | POA: Diagnosis not present

## 2015-07-26 DIAGNOSIS — I10 Essential (primary) hypertension: Secondary | ICD-10-CM | POA: Diagnosis not present

## 2015-07-26 DIAGNOSIS — E7800 Pure hypercholesterolemia, unspecified: Secondary | ICD-10-CM | POA: Diagnosis not present

## 2015-08-01 DIAGNOSIS — H2512 Age-related nuclear cataract, left eye: Secondary | ICD-10-CM | POA: Diagnosis not present

## 2015-08-04 DIAGNOSIS — H2512 Age-related nuclear cataract, left eye: Secondary | ICD-10-CM | POA: Diagnosis not present

## 2015-08-05 DIAGNOSIS — H2512 Age-related nuclear cataract, left eye: Secondary | ICD-10-CM | POA: Diagnosis not present

## 2015-11-08 DIAGNOSIS — Z23 Encounter for immunization: Secondary | ICD-10-CM | POA: Diagnosis not present

## 2015-11-08 DIAGNOSIS — N3941 Urge incontinence: Secondary | ICD-10-CM | POA: Diagnosis not present

## 2015-11-08 DIAGNOSIS — E7800 Pure hypercholesterolemia, unspecified: Secondary | ICD-10-CM | POA: Diagnosis not present

## 2015-11-08 DIAGNOSIS — Z111 Encounter for screening for respiratory tuberculosis: Secondary | ICD-10-CM | POA: Diagnosis not present

## 2015-11-08 DIAGNOSIS — E119 Type 2 diabetes mellitus without complications: Secondary | ICD-10-CM | POA: Diagnosis not present

## 2015-11-08 DIAGNOSIS — I10 Essential (primary) hypertension: Secondary | ICD-10-CM | POA: Diagnosis not present

## 2015-11-11 DIAGNOSIS — R7611 Nonspecific reaction to tuberculin skin test without active tuberculosis: Secondary | ICD-10-CM | POA: Diagnosis not present

## 2015-12-06 DIAGNOSIS — H401123 Primary open-angle glaucoma, left eye, severe stage: Secondary | ICD-10-CM | POA: Diagnosis not present

## 2015-12-06 DIAGNOSIS — H11153 Pinguecula, bilateral: Secondary | ICD-10-CM | POA: Diagnosis not present

## 2015-12-06 DIAGNOSIS — Z9849 Cataract extraction status, unspecified eye: Secondary | ICD-10-CM | POA: Diagnosis not present

## 2015-12-06 DIAGNOSIS — H04123 Dry eye syndrome of bilateral lacrimal glands: Secondary | ICD-10-CM | POA: Diagnosis not present

## 2015-12-06 DIAGNOSIS — Z961 Presence of intraocular lens: Secondary | ICD-10-CM | POA: Diagnosis not present

## 2015-12-06 DIAGNOSIS — H18413 Arcus senilis, bilateral: Secondary | ICD-10-CM | POA: Diagnosis not present

## 2015-12-06 DIAGNOSIS — H11423 Conjunctival edema, bilateral: Secondary | ICD-10-CM | POA: Diagnosis not present

## 2016-02-08 DIAGNOSIS — I10 Essential (primary) hypertension: Secondary | ICD-10-CM | POA: Diagnosis not present

## 2016-02-08 DIAGNOSIS — E7800 Pure hypercholesterolemia, unspecified: Secondary | ICD-10-CM | POA: Diagnosis not present

## 2016-02-08 DIAGNOSIS — E119 Type 2 diabetes mellitus without complications: Secondary | ICD-10-CM | POA: Diagnosis not present

## 2016-02-08 DIAGNOSIS — R3915 Urgency of urination: Secondary | ICD-10-CM | POA: Diagnosis not present

## 2016-02-08 DIAGNOSIS — Z7984 Long term (current) use of oral hypoglycemic drugs: Secondary | ICD-10-CM | POA: Diagnosis not present

## 2016-04-04 DIAGNOSIS — Z961 Presence of intraocular lens: Secondary | ICD-10-CM | POA: Diagnosis not present

## 2016-04-04 DIAGNOSIS — H401111 Primary open-angle glaucoma, right eye, mild stage: Secondary | ICD-10-CM | POA: Diagnosis not present

## 2016-04-04 DIAGNOSIS — Z7984 Long term (current) use of oral hypoglycemic drugs: Secondary | ICD-10-CM | POA: Diagnosis not present

## 2016-04-04 DIAGNOSIS — I10 Essential (primary) hypertension: Secondary | ICD-10-CM | POA: Diagnosis not present

## 2016-04-04 DIAGNOSIS — H47233 Glaucomatous optic atrophy, bilateral: Secondary | ICD-10-CM | POA: Diagnosis not present

## 2016-04-04 DIAGNOSIS — H401122 Primary open-angle glaucoma, left eye, moderate stage: Secondary | ICD-10-CM | POA: Diagnosis not present

## 2016-04-04 DIAGNOSIS — E119 Type 2 diabetes mellitus without complications: Secondary | ICD-10-CM | POA: Diagnosis not present

## 2016-04-04 DIAGNOSIS — H35033 Hypertensive retinopathy, bilateral: Secondary | ICD-10-CM | POA: Diagnosis not present

## 2016-05-07 DIAGNOSIS — R918 Other nonspecific abnormal finding of lung field: Secondary | ICD-10-CM | POA: Diagnosis not present

## 2016-05-07 DIAGNOSIS — M533 Sacrococcygeal disorders, not elsewhere classified: Secondary | ICD-10-CM | POA: Diagnosis not present

## 2016-05-07 DIAGNOSIS — M25511 Pain in right shoulder: Secondary | ICD-10-CM | POA: Diagnosis not present

## 2016-05-07 DIAGNOSIS — M19011 Primary osteoarthritis, right shoulder: Secondary | ICD-10-CM | POA: Diagnosis not present

## 2016-05-07 DIAGNOSIS — T148XXA Other injury of unspecified body region, initial encounter: Secondary | ICD-10-CM | POA: Diagnosis not present

## 2016-05-07 DIAGNOSIS — R4020 Unspecified coma: Secondary | ICD-10-CM | POA: Diagnosis not present

## 2016-05-07 DIAGNOSIS — Z23 Encounter for immunization: Secondary | ICD-10-CM | POA: Diagnosis not present

## 2016-05-07 DIAGNOSIS — E785 Hyperlipidemia, unspecified: Secondary | ICD-10-CM | POA: Diagnosis not present

## 2016-05-07 DIAGNOSIS — I10 Essential (primary) hypertension: Secondary | ICD-10-CM | POA: Diagnosis not present

## 2016-05-07 DIAGNOSIS — E119 Type 2 diabetes mellitus without complications: Secondary | ICD-10-CM | POA: Diagnosis not present

## 2016-05-07 DIAGNOSIS — Z794 Long term (current) use of insulin: Secondary | ICD-10-CM | POA: Diagnosis not present

## 2016-05-07 DIAGNOSIS — M25551 Pain in right hip: Secondary | ICD-10-CM | POA: Diagnosis not present

## 2016-05-07 DIAGNOSIS — M1611 Unilateral primary osteoarthritis, right hip: Secondary | ICD-10-CM | POA: Diagnosis not present

## 2016-05-07 DIAGNOSIS — M4306 Spondylolysis, lumbar region: Secondary | ICD-10-CM | POA: Diagnosis not present

## 2016-05-07 DIAGNOSIS — M160 Bilateral primary osteoarthritis of hip: Secondary | ICD-10-CM | POA: Diagnosis not present

## 2016-05-14 DIAGNOSIS — E7800 Pure hypercholesterolemia, unspecified: Secondary | ICD-10-CM | POA: Diagnosis not present

## 2016-05-14 DIAGNOSIS — I10 Essential (primary) hypertension: Secondary | ICD-10-CM | POA: Diagnosis not present

## 2016-05-14 DIAGNOSIS — E669 Obesity, unspecified: Secondary | ICD-10-CM | POA: Diagnosis not present

## 2016-05-14 DIAGNOSIS — R3915 Urgency of urination: Secondary | ICD-10-CM | POA: Diagnosis not present

## 2016-05-14 DIAGNOSIS — Z7984 Long term (current) use of oral hypoglycemic drugs: Secondary | ICD-10-CM | POA: Diagnosis not present

## 2016-05-14 DIAGNOSIS — E119 Type 2 diabetes mellitus without complications: Secondary | ICD-10-CM | POA: Diagnosis not present

## 2016-05-14 DIAGNOSIS — N182 Chronic kidney disease, stage 2 (mild): Secondary | ICD-10-CM | POA: Diagnosis not present

## 2016-05-31 DIAGNOSIS — R3915 Urgency of urination: Secondary | ICD-10-CM | POA: Diagnosis not present

## 2016-05-31 DIAGNOSIS — N3941 Urge incontinence: Secondary | ICD-10-CM | POA: Diagnosis not present

## 2016-07-05 DIAGNOSIS — H353131 Nonexudative age-related macular degeneration, bilateral, early dry stage: Secondary | ICD-10-CM | POA: Diagnosis not present

## 2016-07-05 DIAGNOSIS — I10 Essential (primary) hypertension: Secondary | ICD-10-CM | POA: Diagnosis not present

## 2016-07-05 DIAGNOSIS — H04123 Dry eye syndrome of bilateral lacrimal glands: Secondary | ICD-10-CM | POA: Diagnosis not present

## 2016-07-05 DIAGNOSIS — H35033 Hypertensive retinopathy, bilateral: Secondary | ICD-10-CM | POA: Diagnosis not present

## 2016-07-05 DIAGNOSIS — H401111 Primary open-angle glaucoma, right eye, mild stage: Secondary | ICD-10-CM | POA: Diagnosis not present

## 2016-07-05 DIAGNOSIS — H11153 Pinguecula, bilateral: Secondary | ICD-10-CM | POA: Diagnosis not present

## 2016-07-05 DIAGNOSIS — H18413 Arcus senilis, bilateral: Secondary | ICD-10-CM | POA: Diagnosis not present

## 2016-07-05 DIAGNOSIS — E119 Type 2 diabetes mellitus without complications: Secondary | ICD-10-CM | POA: Diagnosis not present

## 2016-07-05 DIAGNOSIS — H11423 Conjunctival edema, bilateral: Secondary | ICD-10-CM | POA: Diagnosis not present

## 2016-07-05 DIAGNOSIS — Z7984 Long term (current) use of oral hypoglycemic drugs: Secondary | ICD-10-CM | POA: Diagnosis not present

## 2016-07-05 DIAGNOSIS — H401122 Primary open-angle glaucoma, left eye, moderate stage: Secondary | ICD-10-CM | POA: Diagnosis not present

## 2016-07-05 DIAGNOSIS — H47233 Glaucomatous optic atrophy, bilateral: Secondary | ICD-10-CM | POA: Diagnosis not present

## 2016-07-20 DIAGNOSIS — N401 Enlarged prostate with lower urinary tract symptoms: Secondary | ICD-10-CM | POA: Diagnosis not present

## 2016-07-20 DIAGNOSIS — N3941 Urge incontinence: Secondary | ICD-10-CM | POA: Diagnosis not present

## 2016-07-20 DIAGNOSIS — R3915 Urgency of urination: Secondary | ICD-10-CM | POA: Diagnosis not present

## 2016-07-26 DIAGNOSIS — Z1211 Encounter for screening for malignant neoplasm of colon: Secondary | ICD-10-CM | POA: Diagnosis not present

## 2016-07-26 DIAGNOSIS — N401 Enlarged prostate with lower urinary tract symptoms: Secondary | ICD-10-CM | POA: Diagnosis not present

## 2016-07-26 DIAGNOSIS — Z7984 Long term (current) use of oral hypoglycemic drugs: Secondary | ICD-10-CM | POA: Diagnosis not present

## 2016-07-26 DIAGNOSIS — E7800 Pure hypercholesterolemia, unspecified: Secondary | ICD-10-CM | POA: Diagnosis not present

## 2016-07-26 DIAGNOSIS — I10 Essential (primary) hypertension: Secondary | ICD-10-CM | POA: Diagnosis not present

## 2016-07-26 DIAGNOSIS — E1122 Type 2 diabetes mellitus with diabetic chronic kidney disease: Secondary | ICD-10-CM | POA: Diagnosis not present

## 2016-07-26 DIAGNOSIS — Z0000 Encounter for general adult medical examination without abnormal findings: Secondary | ICD-10-CM | POA: Diagnosis not present

## 2016-07-26 DIAGNOSIS — N183 Chronic kidney disease, stage 3 (moderate): Secondary | ICD-10-CM | POA: Diagnosis not present

## 2016-07-26 DIAGNOSIS — Z23 Encounter for immunization: Secondary | ICD-10-CM | POA: Diagnosis not present

## 2016-07-26 DIAGNOSIS — Z125 Encounter for screening for malignant neoplasm of prostate: Secondary | ICD-10-CM | POA: Diagnosis not present

## 2016-07-26 DIAGNOSIS — Z1159 Encounter for screening for other viral diseases: Secondary | ICD-10-CM | POA: Diagnosis not present

## 2016-08-30 DIAGNOSIS — N3941 Urge incontinence: Secondary | ICD-10-CM | POA: Diagnosis not present

## 2016-08-30 DIAGNOSIS — R3915 Urgency of urination: Secondary | ICD-10-CM | POA: Diagnosis not present

## 2016-08-30 DIAGNOSIS — N401 Enlarged prostate with lower urinary tract symptoms: Secondary | ICD-10-CM | POA: Diagnosis not present

## 2016-10-30 DIAGNOSIS — Z23 Encounter for immunization: Secondary | ICD-10-CM | POA: Diagnosis not present

## 2016-10-30 DIAGNOSIS — N401 Enlarged prostate with lower urinary tract symptoms: Secondary | ICD-10-CM | POA: Diagnosis not present

## 2016-10-30 DIAGNOSIS — I10 Essential (primary) hypertension: Secondary | ICD-10-CM | POA: Diagnosis not present

## 2016-10-30 DIAGNOSIS — N183 Chronic kidney disease, stage 3 (moderate): Secondary | ICD-10-CM | POA: Diagnosis not present

## 2016-10-30 DIAGNOSIS — Z7984 Long term (current) use of oral hypoglycemic drugs: Secondary | ICD-10-CM | POA: Diagnosis not present

## 2016-10-30 DIAGNOSIS — E7800 Pure hypercholesterolemia, unspecified: Secondary | ICD-10-CM | POA: Diagnosis not present

## 2016-10-30 DIAGNOSIS — E119 Type 2 diabetes mellitus without complications: Secondary | ICD-10-CM | POA: Diagnosis not present

## 2016-11-22 DIAGNOSIS — K635 Polyp of colon: Secondary | ICD-10-CM | POA: Diagnosis not present

## 2016-11-22 DIAGNOSIS — K621 Rectal polyp: Secondary | ICD-10-CM | POA: Diagnosis not present

## 2016-11-22 DIAGNOSIS — D126 Benign neoplasm of colon, unspecified: Secondary | ICD-10-CM | POA: Diagnosis not present

## 2016-11-22 DIAGNOSIS — Z1211 Encounter for screening for malignant neoplasm of colon: Secondary | ICD-10-CM | POA: Diagnosis not present

## 2016-11-27 DIAGNOSIS — D126 Benign neoplasm of colon, unspecified: Secondary | ICD-10-CM | POA: Diagnosis not present

## 2016-11-29 DIAGNOSIS — D126 Benign neoplasm of colon, unspecified: Secondary | ICD-10-CM | POA: Diagnosis not present

## 2016-12-20 DIAGNOSIS — N3941 Urge incontinence: Secondary | ICD-10-CM | POA: Diagnosis not present

## 2016-12-20 DIAGNOSIS — R3915 Urgency of urination: Secondary | ICD-10-CM | POA: Diagnosis not present

## 2016-12-20 DIAGNOSIS — N401 Enlarged prostate with lower urinary tract symptoms: Secondary | ICD-10-CM | POA: Diagnosis not present

## 2017-04-30 DIAGNOSIS — N183 Chronic kidney disease, stage 3 (moderate): Secondary | ICD-10-CM | POA: Diagnosis not present

## 2017-04-30 DIAGNOSIS — E119 Type 2 diabetes mellitus without complications: Secondary | ICD-10-CM | POA: Diagnosis not present

## 2017-04-30 DIAGNOSIS — N401 Enlarged prostate with lower urinary tract symptoms: Secondary | ICD-10-CM | POA: Diagnosis not present

## 2017-04-30 DIAGNOSIS — E7800 Pure hypercholesterolemia, unspecified: Secondary | ICD-10-CM | POA: Diagnosis not present

## 2017-04-30 DIAGNOSIS — I10 Essential (primary) hypertension: Secondary | ICD-10-CM | POA: Diagnosis not present

## 2017-04-30 DIAGNOSIS — Z7984 Long term (current) use of oral hypoglycemic drugs: Secondary | ICD-10-CM | POA: Diagnosis not present

## 2017-06-18 DIAGNOSIS — H04123 Dry eye syndrome of bilateral lacrimal glands: Secondary | ICD-10-CM | POA: Diagnosis not present

## 2017-06-18 DIAGNOSIS — H11423 Conjunctival edema, bilateral: Secondary | ICD-10-CM | POA: Diagnosis not present

## 2017-06-18 DIAGNOSIS — H401111 Primary open-angle glaucoma, right eye, mild stage: Secondary | ICD-10-CM | POA: Diagnosis not present

## 2017-06-18 DIAGNOSIS — Z9849 Cataract extraction status, unspecified eye: Secondary | ICD-10-CM | POA: Diagnosis not present

## 2017-06-18 DIAGNOSIS — H401122 Primary open-angle glaucoma, left eye, moderate stage: Secondary | ICD-10-CM | POA: Diagnosis not present

## 2017-06-18 DIAGNOSIS — H47233 Glaucomatous optic atrophy, bilateral: Secondary | ICD-10-CM | POA: Diagnosis not present

## 2017-06-18 DIAGNOSIS — Z961 Presence of intraocular lens: Secondary | ICD-10-CM | POA: Diagnosis not present

## 2017-06-18 DIAGNOSIS — H353131 Nonexudative age-related macular degeneration, bilateral, early dry stage: Secondary | ICD-10-CM | POA: Diagnosis not present

## 2017-06-18 DIAGNOSIS — H18413 Arcus senilis, bilateral: Secondary | ICD-10-CM | POA: Diagnosis not present

## 2017-06-18 DIAGNOSIS — H11153 Pinguecula, bilateral: Secondary | ICD-10-CM | POA: Diagnosis not present

## 2017-08-20 DIAGNOSIS — Z7984 Long term (current) use of oral hypoglycemic drugs: Secondary | ICD-10-CM | POA: Diagnosis not present

## 2017-08-20 DIAGNOSIS — H401111 Primary open-angle glaucoma, right eye, mild stage: Secondary | ICD-10-CM | POA: Diagnosis not present

## 2017-08-20 DIAGNOSIS — H353131 Nonexudative age-related macular degeneration, bilateral, early dry stage: Secondary | ICD-10-CM | POA: Diagnosis not present

## 2017-08-20 DIAGNOSIS — E119 Type 2 diabetes mellitus without complications: Secondary | ICD-10-CM | POA: Diagnosis not present

## 2017-08-20 DIAGNOSIS — H401122 Primary open-angle glaucoma, left eye, moderate stage: Secondary | ICD-10-CM | POA: Diagnosis not present

## 2017-08-20 DIAGNOSIS — H47233 Glaucomatous optic atrophy, bilateral: Secondary | ICD-10-CM | POA: Diagnosis not present

## 2017-10-07 DIAGNOSIS — H35033 Hypertensive retinopathy, bilateral: Secondary | ICD-10-CM | POA: Diagnosis not present

## 2017-10-07 DIAGNOSIS — H402232 Chronic angle-closure glaucoma, bilateral, moderate stage: Secondary | ICD-10-CM | POA: Diagnosis not present

## 2017-10-21 ENCOUNTER — Inpatient Hospital Stay (HOSPITAL_COMMUNITY)
Admission: EM | Admit: 2017-10-21 | Discharge: 2017-10-24 | DRG: 065 | Disposition: A | Discharge status: Home Health | Payer: PPO | Attending: Internal Medicine | Admitting: Internal Medicine

## 2017-10-21 ENCOUNTER — Emergency Department (HOSPITAL_COMMUNITY): Payer: PPO

## 2017-10-21 ENCOUNTER — Other Ambulatory Visit: Payer: Self-pay

## 2017-10-21 ENCOUNTER — Encounter (HOSPITAL_COMMUNITY): Payer: Self-pay

## 2017-10-21 ENCOUNTER — Inpatient Hospital Stay (HOSPITAL_COMMUNITY): Payer: PPO

## 2017-10-21 DIAGNOSIS — E1142 Type 2 diabetes mellitus with diabetic polyneuropathy: Secondary | ICD-10-CM | POA: Diagnosis present

## 2017-10-21 DIAGNOSIS — Z87891 Personal history of nicotine dependence: Secondary | ICD-10-CM

## 2017-10-21 DIAGNOSIS — R42 Dizziness and giddiness: Secondary | ICD-10-CM | POA: Diagnosis not present

## 2017-10-21 DIAGNOSIS — G8191 Hemiplegia, unspecified affecting right dominant side: Secondary | ICD-10-CM | POA: Diagnosis present

## 2017-10-21 DIAGNOSIS — N1831 Chronic kidney disease, stage 3a: Secondary | ICD-10-CM

## 2017-10-21 DIAGNOSIS — I10 Essential (primary) hypertension: Secondary | ICD-10-CM

## 2017-10-21 DIAGNOSIS — R29701 NIHSS score 1: Secondary | ICD-10-CM | POA: Diagnosis present

## 2017-10-21 DIAGNOSIS — I129 Hypertensive chronic kidney disease with stage 1 through stage 4 chronic kidney disease, or unspecified chronic kidney disease: Secondary | ICD-10-CM | POA: Diagnosis present

## 2017-10-21 DIAGNOSIS — I639 Cerebral infarction, unspecified: Secondary | ICD-10-CM | POA: Diagnosis not present

## 2017-10-21 DIAGNOSIS — E1149 Type 2 diabetes mellitus with other diabetic neurological complication: Secondary | ICD-10-CM | POA: Diagnosis present

## 2017-10-21 DIAGNOSIS — N183 Chronic kidney disease, stage 3 unspecified: Secondary | ICD-10-CM

## 2017-10-21 DIAGNOSIS — R51 Headache: Secondary | ICD-10-CM | POA: Diagnosis not present

## 2017-10-21 DIAGNOSIS — R29810 Facial weakness: Secondary | ICD-10-CM | POA: Diagnosis present

## 2017-10-21 DIAGNOSIS — I6523 Occlusion and stenosis of bilateral carotid arteries: Secondary | ICD-10-CM | POA: Diagnosis not present

## 2017-10-21 DIAGNOSIS — Z7984 Long term (current) use of oral hypoglycemic drugs: Secondary | ICD-10-CM

## 2017-10-21 DIAGNOSIS — E119 Type 2 diabetes mellitus without complications: Secondary | ICD-10-CM

## 2017-10-21 DIAGNOSIS — R918 Other nonspecific abnormal finding of lung field: Secondary | ICD-10-CM | POA: Diagnosis not present

## 2017-10-21 DIAGNOSIS — E1151 Type 2 diabetes mellitus with diabetic peripheral angiopathy without gangrene: Secondary | ICD-10-CM | POA: Diagnosis present

## 2017-10-21 DIAGNOSIS — Z8673 Personal history of transient ischemic attack (TIA), and cerebral infarction without residual deficits: Secondary | ICD-10-CM | POA: Diagnosis not present

## 2017-10-21 DIAGNOSIS — E1122 Type 2 diabetes mellitus with diabetic chronic kidney disease: Secondary | ICD-10-CM | POA: Diagnosis present

## 2017-10-21 DIAGNOSIS — Z7982 Long term (current) use of aspirin: Secondary | ICD-10-CM | POA: Diagnosis not present

## 2017-10-21 DIAGNOSIS — I6381 Other cerebral infarction due to occlusion or stenosis of small artery: Secondary | ICD-10-CM | POA: Diagnosis not present

## 2017-10-21 DIAGNOSIS — D320 Benign neoplasm of cerebral meninges: Secondary | ICD-10-CM | POA: Diagnosis not present

## 2017-10-21 DIAGNOSIS — E785 Hyperlipidemia, unspecified: Secondary | ICD-10-CM | POA: Diagnosis present

## 2017-10-21 DIAGNOSIS — H409 Unspecified glaucoma: Secondary | ICD-10-CM | POA: Diagnosis present

## 2017-10-21 DIAGNOSIS — G463 Brain stem stroke syndrome: Secondary | ICD-10-CM | POA: Diagnosis not present

## 2017-10-21 DIAGNOSIS — D62 Acute posthemorrhagic anemia: Secondary | ICD-10-CM

## 2017-10-21 DIAGNOSIS — Z79899 Other long term (current) drug therapy: Secondary | ICD-10-CM | POA: Diagnosis not present

## 2017-10-21 DIAGNOSIS — R270 Ataxia, unspecified: Secondary | ICD-10-CM

## 2017-10-21 DIAGNOSIS — R29818 Other symptoms and signs involving the nervous system: Secondary | ICD-10-CM | POA: Diagnosis not present

## 2017-10-21 HISTORY — DX: Cerebral infarction, unspecified: I63.9

## 2017-10-21 HISTORY — DX: Benign neoplasm of cerebral meninges: D32.0

## 2017-10-21 LAB — CBC
HCT: 42 % (ref 39.0–52.0)
HCT: 40.2 % (ref 39.0–52.0)
Hemoglobin: 13.5 g/dL (ref 13.0–17.0)
Hemoglobin: 12.9 g/dL — ABNORMAL LOW (ref 13.0–17.0)
MCH: 31 pg (ref 26.0–34.0)
MCH: 30.9 pg (ref 26.0–34.0)
MCHC: 32.1 g/dL (ref 30.0–36.0)
MCHC: 32.1 g/dL (ref 30.0–36.0)
MCV: 96.3 fL (ref 78.0–100.0)
MCV: 96.2 fL (ref 78.0–100.0)
Platelets: 227 10*3/uL (ref 150–400)
Platelets: 200 10*3/uL (ref 150–400)
RBC: 4.36 MIL/uL (ref 4.22–5.81)
RBC: 4.18 MIL/uL — ABNORMAL LOW (ref 4.22–5.81)
RDW: 12.7 % (ref 11.5–15.5)
RDW: 12.9 % (ref 11.5–15.5)
WBC: 7.4 10*3/uL (ref 4.0–10.5)
WBC: 8.3 10*3/uL (ref 4.0–10.5)

## 2017-10-21 LAB — ETHANOL: Alcohol, Ethyl (B): <10 mg/dL (ref <10)

## 2017-10-21 LAB — RAPID URINE DRUG SCREEN, HOSP PERFORMED
Amphetamines: NOT DETECTED
Barbiturates: NOT DETECTED
Benzodiazepines: NOT DETECTED
Cocaine: NOT DETECTED
Opiates: NOT DETECTED
Tetrahydrocannabinol: NOT DETECTED

## 2017-10-21 LAB — DIFFERENTIAL
Abs Immature Granulocytes: 0 10*3/uL (ref 0.0–0.1)
Basophils Absolute: 0 10*3/uL (ref 0.0–0.1)
Basophils Relative: 0 %
Eosinophils Absolute: 0.1 10*3/uL (ref 0.0–0.7)
Eosinophils Relative: 1 %
Immature Granulocytes: 0 %
Lymphocytes Relative: 14 %
Lymphs Abs: 1.1 10*3/uL (ref 0.7–4.0)
Monocytes Absolute: 0.4 10*3/uL (ref 0.1–1.0)
Monocytes Relative: 5 %
Neutro Abs: 6 10*3/uL (ref 1.7–7.7)
Neutrophils Relative %: 80 %

## 2017-10-21 LAB — PROTIME-INR
INR: 1.06
Prothrombin Time: 13.7 seconds (ref 11.4–15.2)

## 2017-10-21 LAB — CREATININE, SERUM
Creatinine, Ser: 1.35 mg/dL — ABNORMAL HIGH (ref 0.61–1.24)
GFR calc Af Amer: >60 mL/min (ref >60)
GFR calc non Af Amer: 52 mL/min — ABNORMAL LOW (ref >60)

## 2017-10-21 LAB — URINALYSIS, ROUTINE W REFLEX MICROSCOPIC
Bilirubin Urine: NEGATIVE
Glucose, UA: 50 mg/dL — AB
Ketones, ur: NEGATIVE mg/dL
Leukocytes, UA: NEGATIVE
Nitrite: NEGATIVE
Protein, ur: NEGATIVE mg/dL
Specific Gravity, Urine: 1.04 — ABNORMAL HIGH (ref 1.005–1.030)
pH: 5 (ref 5.0–8.0)

## 2017-10-21 LAB — CBG MONITORING, ED: Glucose-Capillary: 181 mg/dL — ABNORMAL HIGH (ref 70–99)

## 2017-10-21 LAB — I-STAT TROPONIN, ED: Troponin i, poc: 0.01 ng/mL (ref 0.00–0.08)

## 2017-10-21 LAB — GLUCOSE, CAPILLARY: Glucose-Capillary: 111 mg/dL — ABNORMAL HIGH (ref 70–99)

## 2017-10-21 LAB — BASIC METABOLIC PANEL
Anion gap: 8 (ref 5–15)
BUN: 13 mg/dL (ref 8–23)
CO2: 25 mmol/L (ref 22–32)
Calcium: 9.3 mg/dL (ref 8.9–10.3)
Chloride: 111 mmol/L (ref 98–111)
Creatinine, Ser: 1.45 mg/dL — ABNORMAL HIGH (ref 0.61–1.24)
GFR calc Af Amer: 55 mL/min — ABNORMAL LOW (ref >60)
GFR calc non Af Amer: 48 mL/min — ABNORMAL LOW (ref >60)
Glucose, Bld: 206 mg/dL — ABNORMAL HIGH (ref 70–99)
Potassium: 4.1 mmol/L (ref 3.5–5.1)
Sodium: 144 mmol/L (ref 135–145)

## 2017-10-21 LAB — TROPONIN I: Troponin I: <0.03 ng/mL (ref <0.03)

## 2017-10-21 LAB — APTT: aPTT: 25 seconds (ref 24–36)

## 2017-10-21 MED ORDER — ASPIRIN 325 MG PO TABS
325.0000 mg | ORAL_TABLET | Freq: Every day | ORAL | Status: DC
  Administered 2017-10-21 – 2017-10-22 (×2): 325 mg via ORAL
  Filled 2017-10-21 (×2): qty 1

## 2017-10-21 MED ORDER — ACETAMINOPHEN 160 MG/5ML PO SOLN: 650.0000 mg | ORAL | Status: DC | PRN

## 2017-10-21 MED ORDER — ENOXAPARIN SODIUM 40 MG/0.4ML ~~LOC~~ SOLN
40.0000 mg | SUBCUTANEOUS | Status: DC
  Administered 2017-10-21 – 2017-10-23 (×3): 40 mg via SUBCUTANEOUS
  Filled 2017-10-21 (×3): qty 0.4

## 2017-10-21 MED ORDER — TIMOLOL HEMIHYDRATE 0.5 % OP SOLN: 1.0000 [drp] | OPHTHALMIC | Status: DC

## 2017-10-21 MED ORDER — FLUTICASONE PROPIONATE 50 MCG/ACT NA SUSP: 1.0000 | Freq: Every day | NASAL | Status: DC | PRN

## 2017-10-21 MED ORDER — SENNOSIDES-DOCUSATE SODIUM 8.6-50 MG PO TABS: 1.0000 | ORAL_TABLET | Freq: Every evening | ORAL | Status: DC | PRN

## 2017-10-21 MED ORDER — DIAZEPAM 5 MG PO TABS
5.0000 mg | ORAL_TABLET | Freq: Once | ORAL | Status: AC
  Administered 2017-10-21: 5 mg via ORAL
  Filled 2017-10-21: qty 1

## 2017-10-21 MED ORDER — SODIUM CHLORIDE 0.9 % IV BOLUS
1000.0000 mL | Freq: Once | INTRAVENOUS | Status: AC
  Administered 2017-10-21: 1000 mL via INTRAVENOUS

## 2017-10-21 MED ORDER — INSULIN ASPART 100 UNIT/ML ~~LOC~~ SOLN
0.0000 [IU] | Freq: Three times a day (TID) | SUBCUTANEOUS | Status: DC
  Administered 2017-10-22: 2 [IU] via SUBCUTANEOUS
  Administered 2017-10-23: 3 [IU] via SUBCUTANEOUS
  Administered 2017-10-24: 2 [IU] via SUBCUTANEOUS

## 2017-10-21 MED ORDER — STROKE: EARLY STAGES OF RECOVERY BOOK
Freq: Once | Status: DC
  Filled 2017-10-21: qty 1

## 2017-10-21 MED ORDER — ASPIRIN 300 MG RE SUPP: 300.0000 mg | Freq: Every day | RECTAL | Status: DC

## 2017-10-21 MED ORDER — VITAMIN D 1000 UNITS PO TABS
5000.0000 [IU] | ORAL_TABLET | Freq: Every day | ORAL | Status: DC
  Administered 2017-10-22 – 2017-10-24 (×3): 5000 [IU] via ORAL
  Filled 2017-10-21 (×3): qty 5

## 2017-10-21 MED ORDER — POLYETHYL GLYCOL-PROPYL GLYCOL 0.4-0.3 % OP SOLN: 1.0000 [drp] | Freq: Every day | OPHTHALMIC | Status: DC | PRN

## 2017-10-21 MED ORDER — ACETAMINOPHEN 650 MG RE SUPP: 650.0000 mg | RECTAL | Status: DC | PRN

## 2017-10-21 MED ORDER — TIMOLOL MALEATE 0.5 % OP SOLN
1.0000 [drp] | Freq: Every day | OPHTHALMIC | Status: DC
  Administered 2017-10-22 – 2017-10-24 (×3): 1 [drp] via OPHTHALMIC
  Filled 2017-10-21: qty 5

## 2017-10-21 MED ORDER — TRAVOPROST (BAK FREE) 0.004 % OP SOLN
1.0000 [drp] | Freq: Every day | OPHTHALMIC | Status: DC
  Administered 2017-10-21 – 2017-10-23 (×3): 1 [drp] via OPHTHALMIC
  Filled 2017-10-21: qty 2.5

## 2017-10-21 MED ORDER — ACETAMINOPHEN 325 MG PO TABS
650.0000 mg | ORAL_TABLET | ORAL | Status: DC | PRN
  Administered 2017-10-22: 650 mg via ORAL
  Filled 2017-10-21: qty 2

## 2017-10-21 MED ORDER — IOPAMIDOL (ISOVUE-370) INJECTION 76%
50.0000 mL | Freq: Once | INTRAVENOUS | Status: AC | PRN
  Administered 2017-10-21: 50 mL via INTRAVENOUS

## 2017-10-21 MED ORDER — ROSUVASTATIN CALCIUM 40 MG PO TABS
40.0000 mg | ORAL_TABLET | Freq: Every day | ORAL | Status: DC
  Administered 2017-10-21 – 2017-10-23 (×3): 40 mg via ORAL
  Filled 2017-10-21 (×2): qty 2
  Filled 2017-10-21 (×4): qty 1

## 2017-10-21 MED ORDER — SODIUM CHLORIDE 0.9 % IV SOLN
INTRAVENOUS | Status: DC
  Administered 2017-10-21 – 2017-10-22 (×2): via INTRAVENOUS

## 2017-10-21 MED ORDER — MECLIZINE HCL 25 MG PO TABS
25.0000 mg | ORAL_TABLET | Freq: Once | ORAL | Status: AC
  Administered 2017-10-21: 25 mg via ORAL
  Filled 2017-10-21: qty 1

## 2017-10-21 MED ORDER — IOPAMIDOL (ISOVUE-370) INJECTION 76%: 50.0000 mL | Freq: Once | INTRAVENOUS | Status: DC | PRN

## 2017-10-21 NOTE — Consult Note (Addendum)
Neurology Consultation  Reason for Consult: Gait instability Referring Physician: [facility]  CC: Gait instability  History is obtained from: Patient and wife  HPI: James Henderson is a 67 y.o. male with history of stroke, hypertension, hyperlipidemia, glaucoma, diabetes.  Patient had a previous acute midbrain affecting the left peri-aqueduct back in 2015.  At that time both patient and wife stated that he did have difficulty with his gait very much like this however symptoms of nausea vomiting or not present.  Apparently yesterday patient was watching TV most the day but would get up multiple times to go to the bathroom and he has been doing this for the last couple days.  However yesterday in the afternoon he noted that he is very off-balance and would stagger from side to side to get to the bathroom.  He did not fall.  This got worse over the evening and this morning it got no better thus patient came in to the hospital and fear he may be having another stroke.  Patient did not drink alcohol yesterday per wife and also does not take an aspirin.  Per wife yesterday he was having multiple bouts of emesis in addition as stated above multiple bouts of urination.  He even had multiple bouts of emesis this morning but does not have any today.  He denies any vertigo, double vision, numbness tingling throughout the body other than his peripheral neuropathy which is very mild.  He denies any oscillopsia.  He denies any tinnitus or abnormal rushing sounds in his ears.   LKW: 10/20/2017 at approximately 12:00 to 1300 hrs. tpa given?: no, out of window Premorbid modified Rankin scale (mRS): 0 NIH stroke score of 1 due to right facial droop likely from previous stroke    ROS: A 14 point ROS was performed and is negative except as noted in the HPI.  Past Medical History:  Diagnosis Date  . Diabetes mellitus without complication ([place])   . Glaucoma   . Hyperlipidemia   . Hypertension   . Stroke [facility])       Family History  Problem Relation Age of Onset  . Stroke Mother   . Stroke Brother   . Diabetes type II Brother      Social History:   reports that he has quit smoking. He has never used smokeless tobacco. He reports that he does not drink alcohol or use drugs.  Medications  Current Facility-Administered Medications:  .  diazepam (VALIUM) tablet 5 mg, 5 mg, Oral, Once, Fawze, Mina A, PA-C .  iopamidol (ISOVUE-370) 76 % injection 50 mL, 50 mL, Intravenous, Once PRN, Fawze, Mina A, PA-C .  sodium chloride 0.9 % bolus 1,000 mL, 1,000 mL, Intravenous, Once, Fawze, Mina A, PA-C  Current Outpatient Medications:  .  aspirin EC 81 MG tablet, Take 81 mg by mouth daily., Disp: , Rfl:  .  Brinzolamide-Brimonidine (SIMBRINZA) 1-0.2 % SUSP, Place 1 drop into both eyes 3 (three) times daily., Disp: , Rfl:  .  Cholecalciferol (VITAMIN D-3) 5000 UNITS TABS, Take 5,000 Units by mouth daily., Disp: , Rfl:  .  fluticasone (FLONASE) 50 MCG/ACT nasal spray, Place 1 spray into both nostrils daily as needed for allergies or rhinitis., Disp: , Rfl:  .  hydrochlorothiazide (HYDRODIURIL) 25 MG tablet, Take 12.5 mg by mouth daily. , Disp: , Rfl:  .  lovastatin (MEVACOR) 20 MG tablet, Take 20 mg by mouth at bedtime., Disp: , Rfl:  .  metFORMIN (GLUMETZA) 500 MG (MOD) 24 hr  tablet, Take 500 mg by mouth daily with breakfast., Disp: , Rfl:  .  niacin 500 MG tablet, Take 500 mg by mouth 2 (two) times daily with a meal., Disp: , Rfl:  .  Omega-3 Fatty Acids (FISH OIL) 1200 MG CAPS, Take 1 capsule by mouth 2 (two) times daily., Disp: , Rfl:  .  Polyethyl Glycol-Propyl Glycol (SYSTANE ULTRA) 0.4-0.3 % SOLN, Place 1 drop into both eyes daily as needed (dryness or itching)., Disp: , Rfl:  .  timolol (BETIMOL) 0.5 % ophthalmic solution, Place 1 drop into both eyes every morning., Disp: , Rfl:  .  travoprost, benzalkonium, (TRAVATAN) 0.004 % ophthalmic solution, Place 1 drop into both eyes at bedtime., Disp: , Rfl:     Exam: Current vital signs: BP (!) 143/72   Pulse 64   Temp 97.9 F (36.6 C)   Resp 16   SpO2 98%  Vital signs in last 24 hours: Temp:  [97.9 F (36.6 C)] 97.9 F (36.6 C) (09/02 0945) Pulse Rate:  [51-64] 64 (09/02 1345) Resp:  [15-20] 16 (09/02 1345) BP: (122-172)/(61-99) 143/72 (09/02 1415) SpO2:  [94 %-100 %] 98 % (09/02 1345)  GENERAL: Awake, alert in NAD HEENT: - Normocephalic and atraumatic, patient has teeth but in some areas is edentulous Ext: warm, well perfused, intact peripheral pulses,  NEURO:  Mental Status: AA to place and year, speech is dysarthric secondary to being edentulous wife says this is his normal baseline of speech.  Naming, repetition, fluency, patient's comprehension was somewhat limited as he was very slow to respond when asked questions or to follow commands which is abnormal per wife Cranial Nerves: PERRL 2 mm/brisk. EOMI, visual fields full, slight right facial asymmetry in the nasolabial fold,facial sensation intact, hearing intact, tongue/uvula--upon standing he might of had very mild lateral nystagmus however if present it was extremely mild Motor: 5/5 throughout Tone: is normal and bulk is normal Sensation-patient has decreased cold sensation from his feet up to mid calf, vibratory sensation intact, proprioception is intact, pinprick is decreased up to his mid calf. Coordination: FTN intact bilaterally, no ataxia in BLE. Gait-patient had difficulty getting up from a seated position to standing position.  He did have retropulsion when standing.  When I tried to get him to walk he was clearly off balance tending to wave to the right and left and needing a 2 person assist.   I did do orthostatics which were negative  Labs I have reviewed labs in epic and the results pertinent to this consultation are:   CBC    Component Value Date/Time   WBC 7.4 10/21/2017 0717   RBC 4.36 10/21/2017 0717   HGB 13.5 10/21/2017 0717   HCT 42.0 10/21/2017  0717   PLT 227 10/21/2017 0717   MCV 96.3 10/21/2017 0717   MCH 31.0 10/21/2017 0717   MCHC 32.1 10/21/2017 0717   RDW 12.7 10/21/2017 0717   LYMPHSABS 1.1 10/21/2017 0717   MONOABS 0.4 10/21/2017 0717   EOSABS 0.1 10/21/2017 0717   BASOSABS 0.0 10/21/2017 0717    CMP     Component Value Date/Time   NA 144 10/21/2017 0717   K 4.1 10/21/2017 0717   CL 111 10/21/2017 0717   CO2 25 10/21/2017 0717   GLUCOSE 206 (H) 10/21/2017 0717   BUN 13 10/21/2017 0717   CREATININE 1.45 (H) 10/21/2017 0717   CALCIUM 9.3 10/21/2017 0717   PROT 7.7 12/06/2013 2220   ALBUMIN 3.9 12/06/2013 2220   AST 17  12/06/2013 2220   ALT 14 12/06/2013 2220   ALKPHOS 108 12/06/2013 2220   BILITOT 0.2 (L) 12/06/2013 2220   GFRNONAA 48 (L) 10/21/2017 0717   GFRAA 55 (L) 10/21/2017 0717    Lipid Panel     Component Value Date/Time   CHOL 170 12/07/2013 0510   TRIG 185 (H) 12/07/2013 0510   HDL 33 (L) 12/07/2013 0510   CHOLHDL 5.2 12/07/2013 0510   VLDL 37 12/07/2013 0510   LDLCALC 100 (H) 12/07/2013 0510   I have seen the patient and reviewed the above note.   I think that he does have a slightly impaired FNF on the left, slower, but without clear passpointing.   Imaging I have reviewed the images obtained:  CTA head and neck:--Unfortunately this was suboptimal.  However no significant carotid or vertebral stenosis in his neck was noted.  He does have diffuse intracranial atherosclerosis with extensive MCA and PCA disease bilaterally.  MRI examination of the brain--today's MRI was negative for any acute infarct, moderate atrophy and moderate chronic microvascular ischemia.  He does have extensive widespread micro-calcifications throughout the cerebellum, brainstem and also cerebellar hemispheres with progression since 2015.  He does have a 4.8 x 10 mm meningioma in the left lateral tentorium but this is unchanged since 2015  Repeat DWI, small middle cerebellar peduncle infarct.   Assessment: 67 yo  M with acute unsteadiness due to small cerebellar peduncle infarct. I suspect that this is due to small vessel disease.   Recommendations: - HgbA1c, fasting lipid panel - Frequent neuro checks - Echocardiogram - Carotid dopplers - Prophylactic therapy-Antiplatelet med: Aspirin - dose 325mg  PO or 300mg  PR, plavix x 3 weeks.  - Risk factor modification - Telemetry monitoring - PT consult, OT consult, Speech consult - Stroke team to follow  Roland Rack, MD [facility] [phone]  If 7pm- 7am, please page neurology on call as listed in [place].

## 2017-10-21 NOTE — ED Triage Notes (Signed)
Patient complains of vomiting x 1 day with headache and general weakness. Patient alert and oriented, reports dull headache. No arm drift, no slurred speech. Grip strength equal, mae x 4

## 2017-10-21 NOTE — ED Provider Notes (Signed)
[facility] EMERGENCY DEPARTMENT Provider Note   CSN: [id number] Arrival date & time: 10/21/17  2952     History   Chief Complaint Chief Complaint  Patient presents with  . weakness/emesis    HPI James Henderson is a 67 y.o. male with history of diabetes mellitus, glaucoma, hyperlipidemia, hypertension, stroke presents for evaluation of acute onset, persisting dizziness and difficulty ambulating since around noon yesterday.  He states that he has felt extremely unsteady in his gait and has required assistance to ambulate since around that time and this has worsened.  He notes mild left-sided headache.  He denies blurred vision or diplopia, numbness, slurred speech, or aphasia.  Wife notes that he has been slower to answer questions.  He has had multiple episodes of nonbloody nonbilious emesis and ongoing nausea primarily last night.  States his blood pressure is typically well controlled and he did take his blood pressure medicine this morning.  States this feels somewhat similar to the stroke he experienced in 2015.  Denies abdominal pain, chest pain, or shortness of breath.  The history is provided by the patient and the spouse.    Past Medical History:  Diagnosis Date  . Diabetes mellitus without complication ([place])   . Glaucoma   . Hyperlipidemia   . Hypertension   . Stroke [facility])     Patient Active Problem List   Diagnosis Date Noted  . Acute CVA (cerebrovascular accident) ([place]) 10/21/2017  . HLD (hyperlipidemia) 01/10/2014  . Acute brainstem infarction ([place]) 12/07/2013  . Dizziness 12/06/2013  . DM (diabetes mellitus) type II controlled, neurological manifestation ([place]) 12/06/2013  . HYPERLIPIDEMIA 02/26/2006  . GLAUCOMA NOS 02/26/2006  . Essential hypertension 02/26/2006    Past Surgical History:  Procedure Laterality Date  . EYE SURGERY          Home Medications    Prior to Admission medications   Medication Sig Start Date End Date Taking?  Authorizing Provider  aspirin EC 81 MG tablet Take 81 mg by mouth daily.    [provider]  Brinzolamide-Brimonidine (SIMBRINZA) 1-0.2 % SUSP Place 1 drop into both eyes 3 (three) times daily.    [provider]  Cholecalciferol (VITAMIN D-3) 5000 UNITS TABS Take 5,000 Units by mouth daily.    [provider]  fluticasone (FLONASE) 50 MCG/ACT nasal spray Place 1 spray into both nostrils daily as needed for allergies or rhinitis.    [provider]  hydrochlorothiazide (HYDRODIURIL) 25 MG tablet Take 12.5 mg by mouth daily.     [provider]  lovastatin (MEVACOR) 20 MG tablet Take 20 mg by mouth at bedtime.    [provider]  metFORMIN (GLUMETZA) 500 MG (MOD) 24 hr tablet Take 500 mg by mouth daily with breakfast.    [provider]  niacin 500 MG tablet Take 500 mg by mouth 2 (two) times daily with a meal.    [provider]  Omega-3 Fatty Acids (FISH OIL) 1200 MG CAPS Take 1 capsule by mouth 2 (two) times daily.    [provider]  Polyethyl Glycol-Propyl Glycol (SYSTANE ULTRA) 0.4-0.3 % SOLN Place 1 drop into both eyes daily as needed (dryness or itching).    [provider]  timolol (BETIMOL) 0.5 % ophthalmic solution Place 1 drop into both eyes every morning.    [provider]  travoprost, benzalkonium, (TRAVATAN) 0.004 % ophthalmic solution Place 1 drop into both eyes at bedtime.    [provider]  Family History Family History  Problem Relation Age of Onset  . Stroke Mother   . Stroke Brother   . Diabetes type II Brother     Social History Social History   Tobacco Use  . Smoking status: Former Research scientist (life sciences)  . Smokeless tobacco: Never Used  Substance Use Topics  . Alcohol use: No    Alcohol/week: 0.0 standard drinks  . Drug use: No     Allergies   Patient has no known allergies.   Review of Systems Review of Systems  Constitutional: Negative for chills and  fever.  Respiratory: Negative for shortness of breath.   Cardiovascular: Negative for chest pain.  Gastrointestinal: Positive for nausea and vomiting. Negative for abdominal pain, blood in stool, constipation and diarrhea.  Genitourinary: Negative for dysuria and hematuria.  Musculoskeletal: Positive for gait problem.  Neurological: Positive for dizziness and headaches. Negative for syncope, weakness and numbness.  All other systems reviewed and are negative.    Physical Exam Updated Vital Signs BP (!) 145/71   Pulse (!) 59   Temp 97.9 F (36.6 C)   Resp 18   SpO2 95%   Physical Exam  Constitutional: He is oriented to person, place, and time. He appears well-developed and well-nourished. No distress.  HENT:  Head: Normocephalic and atraumatic.  Eyes: Pupils are equal, round, and reactive to light. Conjunctivae and EOM are normal. Right eye exhibits no discharge. Left eye exhibits no discharge.  Neck: No JVD present. No tracheal deviation present.  Cardiovascular: Normal rate, regular rhythm, normal heart sounds and intact distal pulses.  Pulmonary/Chest: Effort normal and breath sounds normal.  Abdominal: Soft. Bowel sounds are normal. He exhibits no distension and no mass. There is tenderness. There is no guarding.  Mild left upper quadrant tenderness to palpation.  No guarding no rebound.  Murphy sign absent, Rovsing's absent, no CVA tenderness  Musculoskeletal: He exhibits no edema.  No midline spine TTP, no paraspinal muscle tenderness, no deformity, crepitus, or step-off noted.  5/5 strength of BUE and BLE major muscle groups  Neurological: He is alert and oriented to person, place, and time. No cranial nerve deficit or sensory deficit. Coordination abnormal.  Mental Status:  Alert, thought content appropriate, able to give a coherent history.  Somewhat slow to answer questions.  Speech fluent without evidence of aphasia. Able to follow 2 step commands without difficulty.    Cranial Nerves:  II:  Peripheral visual fields grossly normal, pupils equal, round, reactive to light III,IV, VI: ptosis not present, extra-ocular motions intact bilaterally  V,VII: smile symmetric, facial light touch sensation equal VIII: hearing grossly normal to voice  X: uvula elevates symmetrically  XI: bilateral shoulder shrug symmetric and strong XII: midline tongue extension without fassiculations Motor:  Normal tone. 5/5 strength of BUE and BLE major muscle groups including strong and equal grip strength and dorsiflexion/plantar flexion Sensory: light touch normal in all extremities. Cerebellar: Mild dysmetria of the left upper extremity with finger-to-nose  Gait: Extremely ataxic, unable to ambulate more than 1 step, needs to steady himself on surrounding objects. Romberg sign present. No pronator drift. No nystagmus.   Skin: Skin is warm and dry. No erythema.  Psychiatric: He has a normal mood and affect. His behavior is normal.  Nursing note and vitals reviewed.    ED Treatments / Results  Labs (all labs ordered are listed, but only abnormal results are displayed) Labs Reviewed  BASIC METABOLIC PANEL - Abnormal; Notable for the following components:  Result Value   Glucose, Bld 206 (*)    Creatinine, Ser 1.45 (*)    GFR calc non Af Amer 48 (*)    GFR calc Af Amer 55 (*)    All other components within normal limits  URINALYSIS, ROUTINE W REFLEX MICROSCOPIC - Abnormal; Notable for the following components:   Specific Gravity, Urine 1.040 (*)    Glucose, UA 50 (*)    Hgb urine dipstick MODERATE (*)    Bacteria, UA RARE (*)    All other components within normal limits  CBG MONITORING, ED - Abnormal; Notable for the following components:   Glucose-Capillary 181 (*)    All other components within normal limits  CBC  ETHANOL  PROTIME-INR  APTT  RAPID URINE DRUG SCREEN, HOSP PERFORMED  DIFFERENTIAL  CBG MONITORING, ED  I-STAT TROPONIN, ED     EKG None  Radiology Ct Angio Head W Or Wo Contrast  Result Date: 10/21/2017 CLINICAL DATA:  Focal neuro deficit greater than 6 hours. Suspect stroke. EXAM: CT ANGIOGRAPHY HEAD AND NECK TECHNIQUE: Multidetector CT imaging of the head and neck was performed using the standard protocol during bolus administration of intravenous contrast. Multiplanar CT image reconstructions and MIPs were obtained to evaluate the vascular anatomy. Carotid stenosis measurements (when applicable) are obtained utilizing NASCET criteria, using the distal internal carotid diameter as the denominator. CONTRAST:  25mL ISOVUE-370 IOPAMIDOL (ISOVUE-370) INJECTION 76% COMPARISON:  MRI head 12/07/2013 FINDINGS: CT HEAD FINDINGS Brain: Moderate atrophy. Moderate chronic microvascular ischemic changes in the white matter. Small chronic infarct left occipital lobe unchanged. Negative for acute infarct, hemorrhage, or mass lesion. No midline shift. Vascular: Negative for hyperdense vessel Skull: Negative Sinuses: Paranasal sinuses clear bilaterally. Orbits:  Bilateral cataract surgery. Review of the MIP images confirms the above findings CTA NECK FINDINGS Aortic arch: Suboptimal arterial opacification due to early timing of the scan. Mild atherosclerotic disease in the aortic arch. Proximal great vessels widely patent Right carotid system: Right common carotid artery widely patent. Mild atherosclerotic disease right carotid bifurcation without significant stenosis. Left carotid system: Left common carotid artery widely patent. Mild atherosclerotic disease left carotid bifurcation without significant stenosis Vertebral arteries: Both vertebral arteries appear patent to the basilar. Proximal vertebral arteries not well visualized due to suboptimal opacification and motion. Skeleton: Moderate cervical spondylosis. Poor dentition with numerous caries and periapical lucency especially left upper molar Other neck: Negative for mass or adenopathy  Upper chest: Negative Review of the MIP images confirms the above findings CTA HEAD FINDINGS Anterior circulation: Atherosclerotic calcification and mild stenosis of the cavernous carotid bilaterally. Severe stenosis of the temporal branch M2 bilaterally. Mild atherosclerotic disease in the M1 segment bilaterally with mild irregularity and no significant stenosis. Multiple areas of stenosis in M3 branches bilaterally. Posterior circulation: Both vertebral arteries patent to the basilar without stenosis. Basilar mildly irregular without stenosis. Left PICA patent. Right PICA not visualized. AICA patent bilaterally. Superior cerebellar and posterior cerebral arteries patent bilaterally. Diffuse atherosclerotic disease and mild to moderate stenosis in the posterior cerebral artery bilaterally. Venous sinuses: Lack of venous enhancement due to timing Anatomic variants: None Delayed phase: Not performed Review of the MIP images confirms the above findings IMPRESSION: Suboptimal CTA technique due to early scanning and suboptimal arterial opacification Mild atherosclerotic disease in the carotid bifurcation bilaterally. No significant carotid or vertebral artery stenosis in the neck. Mild atherosclerotic stenosis in the cavernous carotid bilaterally. Diffuse intracranial atherosclerotic disease with extensive MCA and PCA disease bilaterally. No emergent large vessel  occlusion. Atrophy and moderate chronic microvascular ischemia. No acute intracranial abnormality. Electronically Signed   By: Franchot Gallo M.D.   On: 10/21/2017 10:13   Ct Angio Neck W And/or Wo Contrast  Result Date: 10/21/2017 CLINICAL DATA:  Focal neuro deficit greater than 6 hours. Suspect stroke. EXAM: CT ANGIOGRAPHY HEAD AND NECK TECHNIQUE: Multidetector CT imaging of the head and neck was performed using the standard protocol during bolus administration of intravenous contrast. Multiplanar CT image reconstructions and MIPs were obtained to evaluate  the vascular anatomy. Carotid stenosis measurements (when applicable) are obtained utilizing NASCET criteria, using the distal internal carotid diameter as the denominator. CONTRAST:  51mL ISOVUE-370 IOPAMIDOL (ISOVUE-370) INJECTION 76% COMPARISON:  MRI head 12/07/2013 FINDINGS: CT HEAD FINDINGS Brain: Moderate atrophy. Moderate chronic microvascular ischemic changes in the white matter. Small chronic infarct left occipital lobe unchanged. Negative for acute infarct, hemorrhage, or mass lesion. No midline shift. Vascular: Negative for hyperdense vessel Skull: Negative Sinuses: Paranasal sinuses clear bilaterally. Orbits:  Bilateral cataract surgery. Review of the MIP images confirms the above findings CTA NECK FINDINGS Aortic arch: Suboptimal arterial opacification due to early timing of the scan. Mild atherosclerotic disease in the aortic arch. Proximal great vessels widely patent Right carotid system: Right common carotid artery widely patent. Mild atherosclerotic disease right carotid bifurcation without significant stenosis. Left carotid system: Left common carotid artery widely patent. Mild atherosclerotic disease left carotid bifurcation without significant stenosis Vertebral arteries: Both vertebral arteries appear patent to the basilar. Proximal vertebral arteries not well visualized due to suboptimal opacification and motion. Skeleton: Moderate cervical spondylosis. Poor dentition with numerous caries and periapical lucency especially left upper molar Other neck: Negative for mass or adenopathy Upper chest: Negative Review of the MIP images confirms the above findings CTA HEAD FINDINGS Anterior circulation: Atherosclerotic calcification and mild stenosis of the cavernous carotid bilaterally. Severe stenosis of the temporal branch M2 bilaterally. Mild atherosclerotic disease in the M1 segment bilaterally with mild irregularity and no significant stenosis. Multiple areas of stenosis in M3 branches  bilaterally. Posterior circulation: Both vertebral arteries patent to the basilar without stenosis. Basilar mildly irregular without stenosis. Left PICA patent. Right PICA not visualized. AICA patent bilaterally. Superior cerebellar and posterior cerebral arteries patent bilaterally. Diffuse atherosclerotic disease and mild to moderate stenosis in the posterior cerebral artery bilaterally. Venous sinuses: Lack of venous enhancement due to timing Anatomic variants: None Delayed phase: Not performed Review of the MIP images confirms the above findings IMPRESSION: Suboptimal CTA technique due to early scanning and suboptimal arterial opacification Mild atherosclerotic disease in the carotid bifurcation bilaterally. No significant carotid or vertebral artery stenosis in the neck. Mild atherosclerotic stenosis in the cavernous carotid bilaterally. Diffuse intracranial atherosclerotic disease with extensive MCA and PCA disease bilaterally. No emergent large vessel occlusion. Atrophy and moderate chronic microvascular ischemia. No acute intracranial abnormality. Electronically Signed   By: Franchot Gallo M.D.   On: 10/21/2017 10:13   Mr Brain Wo Contrast  Result Date: 10/21/2017 CLINICAL DATA:  Persistent stroke symptoms. Follow-up with thins through the brainstem EXAM: MRI HEAD WITHOUT CONTRAST TECHNIQUE: Multiplanar, multiecho pulse sequences of the brain and surrounding structures were obtained without intravenous contrast. COMPARISON:  Brain MRI earlier today FINDINGS: Diffusion only imaging shows 5 mm acute infarct along the deep surface of the left brachium pontis. Susceptibility artifact from remote microhemorrhages that is far underestimated relative to previous SWI imaging. IMPRESSION: Diffusion only scan shows a 5 mm acute infarct along the deep surface of the left brachium  pontis. Electronically Signed   By: Monte Fantasia M.D.   On: 10/21/2017 16:34   Mr Brain Wo Contrast (neuro Protocol)  Result Date:  10/21/2017 CLINICAL DATA:  Focal neuro deficit greater than 6 hours, suspect stroke. History of diabetes, hyperlipidemia, hypertension, stroke. EXAM: MRI HEAD WITHOUT CONTRAST TECHNIQUE: Multiplanar, multiecho pulse sequences of the brain and surrounding structures were obtained without intravenous contrast. COMPARISON:  CT head 10/21/2017, MRI head 12/07/2013 FINDINGS: Brain: Negative for acute infarct. Moderate atrophy and moderate chronic microvascular ischemia. Widespread chronic microhemorrhage throughout the brain including the cerebellum, brainstem, and cerebral hemispheres bilaterally. This is extensive and has progressed since 2015. No lobar hemorrhage. 8 x 10 mm mass just below the tentorium laterally on the left is unchanged most compatible with meningioma. This is mildly calcified on CT. Vascular: Normal arterial flow void Skull and upper cervical spine: Negative Sinuses/Orbits: Paranasal sinuses clear. Bilateral cataract surgery. Other: None IMPRESSION: 1. Negative for acute infarct 2. Moderate atrophy and moderate chronic microvascular ischemia 3. Extensive and widespread microcalcifications throughout the cerebellum, brainstem, and both cerebral hemispheres, with progression since 2015. Favor poorly controlled hypertension although cerebral amyloid is also possible. 4. 8 x 10 mm meningioma left lateral tentorium. Unchanged from 2015. Electronically Signed   By: Franchot Gallo M.D.   On: 10/21/2017 11:54    Procedures Procedures (including critical care time)  Medications Ordered in ED Medications  iopamidol (ISOVUE-370) 76 % injection 50 mL (has no administration in time range)  iopamidol (ISOVUE-370) 76 % injection 50 mL (50 mLs Intravenous Contrast Given 10/21/17 0859)  meclizine (ANTIVERT) tablet 25 mg (25 mg Oral Given 10/21/17 0948)  sodium chloride 0.9 % bolus 1,000 mL (1,000 mLs Intravenous New Bag/Given 10/21/17 1501)  diazepam (VALIUM) tablet 5 mg (5 mg Oral Given 10/21/17 1508)     Initial Impression / Assessment and Plan / ED Course  I have reviewed the triage vital signs and the nursing notes.  Pertinent labs & imaging results that were available during my care of the patient were reviewed by me and considered in my medical decision making (see chart for details).    Patient with acute onset, progressively worsening dizziness and difficulty ambulating since around noon yesterday.  History of brainstem stroke in 2015.  He is afebrile, hypertensive in the ED and mildly bradycardic.  He has an extremely unsteady gait and cannot ambulate.  Mild dysmetria of the left upper externally noted on finger-to-nose testing.  Obtained CTA of the head and neck which showed diffuse intracranial atherosclerotic disease with extensive MCA and PCA disease bilaterally.  Also notes mild atherosclerotic disease at the carotid bifurcation.  No acute intracranial abnormality identified.  MRI of the brain was subsequently obtained which shows moderate atrophy and moderate chronic microvascular ischemia as well as extensive and widespread microcalcifications throughout the cerebellum, brainstem, and both cerebral hemispheres which has progressed since 2015.  Differential includes poorly controlled hypertension although cerebral amyloid is also considered.  He has a stable meningioma.  1:36 PM Spoke with Dr. Leonel Ramsay, neurology will see and assess patient and make further recommendations.  4:09 PM Neurology has ordered a second MRI with spoke with specific instructions for fine cuts.  Concern for possible small acute infarct that the initial MRI did not identify due to larger cuts.  Patient was given IV fluids, meclizine, and Valium and is still unable to ambulate.  Neurology recommends hospitalist admission.  Spoke with Dr. Allyson Sabal with [facility] who agrees to assume care of patient and bring  him into the hospital for further evaluation and management.  Patient seen and evaluated by Dr. Billy Fischer who  agrees with assessment and plan at this time.  5:30 PM Second MRI shows 5 mm acute infarct along the deep surface of the left brachium pontis. Final Clinical Impressions(s) / ED Diagnoses   Final diagnoses:  Ataxia    ED Discharge Orders    None       Renita Papa, PA-C 10/21/17 1730    Gareth Morgan, MD 10/23/17 [phone]

## 2017-10-21 NOTE — ED Notes (Signed)
Pt attempting to provide UA

## 2017-10-21 NOTE — ED Notes (Signed)
Pt CBG 181. Notified Roselyn Reef, Therapist, sports.

## 2017-10-21 NOTE — H&P (Signed)
[facility] History and Physical  Bolton Mcbroom ZHY:[id number] DOB: 28-Dec-1950 DOA: 10/21/2017  Referring physician  PCP: Lois Huxley, PA   Chief Complaint: ataxia  HPI:  67 year old male with a prior history of stroke in 2015, hypertension, dyslipidemia, diabetes mellitus presents to the ED today because of difficulty ambulating , nausea vomiting 1 day associated with a headache and generalized weakness.patient denied any slurred speech , unilateral weakness. Yesterday afternoon he noted himself to be off balance and staggering. Concern that he may have had another stroke and came to the hospital.patient has apparently had polyuria polydipsia for the last week. No fever no chills, no chest pain . No SOB. ED course  BP (!) 170/84   Pulse (!) 55   Temp 97.9 F (36.6 C) (Oral)   Resp 18   SpO2 99%  Patient had a CT of the head and neck in the ED that  Showed mild atherosclerosis of the carotid bifurcation bilaterally. MRI of the brain showed 5 mm acute infarct along the deep surface of the left brachium pontis. Patient seen by neurology in the ED admitted for stroke workup.      Review of Systems: negative for the following  Constitutional: Denies fever, chills, diaphoresis, appetite change and fatigue.  HEENT: Denies photophobia, eye pain, redness, hearing loss, ear pain, congestion, sore throat, rhinorrhea, sneezing, mouth sores, trouble swallowing, neck pain, neck stiffness and tinnitus.  Respiratory: Denies SOB, DOE, cough, chest tightness, and wheezing.  Cardiovascular: Denies chest pain, palpitations and leg swelling.  Gastrointestinal: Positive for nausea and vomiting. Negative for abdominal pain, blood in stool, constipation and diarrhea.  Genitourinary: Denies dysuria, urgency, frequency, hematuria, flank pain and difficulty urinating.  Musculoskeletal: Denies myalgias, back pain, joint swelling, arthralgias and gait problem.  Skin: Denies pallor, rash and wound.   Neurological: Positive for dizziness and headaches. Negative for syncope, weakness and numbness.  Hematological: Denies adenopathy. Easy bruising, personal or family bleeding history  Psychiatric/Behavioral: Denies suicidal ideation, mood changes, confusion, nervousness, sleep disturbance and agitation       Past Medical History:  Diagnosis Date  . Diabetes mellitus without complication ([place])   . Glaucoma   . Hyperlipidemia   . Hypertension   . Stroke [facility])      Past Surgical History:  Procedure Laterality Date  . EYE SURGERY        Social History:  reports that he has quit smoking. He has never used smokeless tobacco. He reports that he does not drink alcohol or use drugs.    No Known Allergies  Family History  Problem Relation Age of Onset  . Stroke Mother   . Stroke Brother   . Diabetes type II Brother         Prior to Admission medications   Medication Sig Start Date End Date Taking? Authorizing Provider  aspirin EC 81 MG tablet Take 81 mg by mouth daily.    [provider]  Brinzolamide-Brimonidine (SIMBRINZA) 1-0.2 % SUSP Place 1 drop into both eyes 3 (three) times daily.    [provider]  Cholecalciferol (VITAMIN D-3) 5000 UNITS TABS Take 5,000 Units by mouth daily.    [provider]  fluticasone (FLONASE) 50 MCG/ACT nasal spray Place 1 spray into both nostrils daily as needed for allergies or rhinitis.    [provider]  hydrochlorothiazide (HYDRODIURIL) 25 MG tablet Take 12.5 mg by mouth daily.     [provider]  lovastatin (MEVACOR) 20 MG tablet Take 20 mg by  mouth at bedtime.    [provider]  metFORMIN (GLUMETZA) 500 MG (MOD) 24 hr tablet Take 500 mg by mouth daily with breakfast.    [provider]  niacin 500 MG tablet Take 500 mg by mouth 2 (two) times daily with a meal.    [provider]  Omega-3 Fatty Acids (FISH OIL) 1200 MG CAPS Take 1 capsule by mouth 2 (two) times  daily.    [provider]  Polyethyl Glycol-Propyl Glycol (SYSTANE ULTRA) 0.4-0.3 % SOLN Place 1 drop into both eyes daily as needed (dryness or itching).    [provider]  timolol (BETIMOL) 0.5 % ophthalmic solution Place 1 drop into both eyes every morning.    [provider]  travoprost, benzalkonium, (TRAVATAN) 0.004 % ophthalmic solution Place 1 drop into both eyes at bedtime.    [provider]     Physical Exam: Vitals:   10/21/17 1430 10/21/17 1445 10/21/17 1500 10/21/17 1515  BP: (!) 152/67 (!) 147/73 (!) 146/73 (!) 145/71  Pulse:  (!) 56 (!) 55 (!) 59  Resp:    18  Temp:      TempSrc:      SpO2:  96% 95% 95%        Vitals:   10/21/17 1430 10/21/17 1445 10/21/17 1500 10/21/17 1515  BP: (!) 152/67 (!) 147/73 (!) 146/73 (!) 145/71  Pulse:  (!) 56 (!) 55 (!) 59  Resp:    18  Temp:      TempSrc:      SpO2:  96% 95% 95%   Constitutional: NAD, calm, comfortable Eyes: PERRL, lids and conjunctivae normal ENMT: Mucous membranes are moist. Posterior pharynx clear of any exudate or lesions.Normal dentition.  Neck: normal, supple, no masses, no thyromegaly Respiratory: clear to auscultation bilaterally, no wheezing, no crackles. Normal respiratory effort. No accessory muscle use.  Cardiovascular: Regular rate and rhythm, no murmurs / rubs / gallops. No extremity edema. 2+ pedal pulses. No carotid bruits.  Abdomen: no tenderness, no masses palpated. No hepatosplenomegaly. Bowel sounds positive.  Musculoskeletal: no clubbing / cyanosis. No joint deformity upper and lower extremities. Good ROM, no contractures. Normal muscle tone.  Skin: no rashes, lesions, ulcers. No induration Neurologic: CN 2-12 grossly intact. Sensation intact, DTR normal. Strength 5/5 in all 4.  Psychiatric: Normal judgment and insight. Alert and oriented x 3. Normal mood.     Labs on Admission: I have personally reviewed following labs and imaging  studies  CBC: Recent Labs  Lab 10/21/17 0717  WBC 7.4  NEUTROABS 6.0  HGB 13.5  HCT 42.0  MCV 96.3  PLT 983    Basic Metabolic Panel: Recent Labs  Lab 10/21/17 0717  NA 144  K 4.1  CL 111  CO2 25  GLUCOSE 206*  BUN 13  CREATININE 1.45*  CALCIUM 9.3    GFR: CrCl cannot be calculated (Unknown ideal weight.).  Liver Function Tests: No results for input(s): AST, ALT, ALKPHOS, BILITOT, PROT, ALBUMIN in the last 168 hours. No results for input(s): LIPASE, AMYLASE in the last 168 hours. No results for input(s): AMMONIA in the last 168 hours.  Coagulation Profile: Recent Labs  Lab 10/21/17 0842  INR 1.06   No results for input(s): DDIMER in the last 72 hours.  Cardiac Enzymes: No results for input(s): CKTOTAL, CKMB, CKMBINDEX, TROPONINI in the last 168 hours.  BNP (last 3 results) No results for input(s): PROBNP in the last 8760 hours.  HbA1C: No results for input(s):  HGBA1C in the last 72 hours. Lab Results  Component Value Date   HGBA1C 7.1 (H) 12/08/2013   HGBA1C 7.3 (H) 12/07/2013     CBG: Recent Labs  Lab 10/21/17 0708  GLUCAP 181*    Lipid Profile: No results for input(s): CHOL, HDL, LDLCALC, TRIG, CHOLHDL, LDLDIRECT in the last 72 hours.  Thyroid Function Tests: No results for input(s): TSH, T4TOTAL, FREET4, T3FREE, THYROIDAB in the last 72 hours.  Anemia Panel: No results for input(s): VITAMINB12, FOLATE, FERRITIN, TIBC, IRON, RETICCTPCT in the last 72 hours.  Urine analysis:    Component Value Date/Time   COLORURINE YELLOW 10/21/2017 1036   APPEARANCEUR CLEAR 10/21/2017 1036   LABSPEC 1.040 (H) 10/21/2017 1036   PHURINE 5.0 10/21/2017 1036   GLUCOSEU 50 (A) 10/21/2017 1036   HGBUR MODERATE (A) 10/21/2017 1036   BILIRUBINUR NEGATIVE 10/21/2017 1036   KETONESUR NEGATIVE 10/21/2017 1036   PROTEINUR NEGATIVE 10/21/2017 1036   UROBILINOGEN 0.2 12/07/2013 0032   NITRITE NEGATIVE 10/21/2017 1036   LEUKOCYTESUR NEGATIVE 10/21/2017 1036     Sepsis Labs: @LABRCNTIP (procalcitonin:4,lacticidven:4) )No results found for this or any previous visit (from the past 240 hour(s)).       Radiological Exams on Admission: Ct Angio Head W Or Wo Contrast  Result Date: 10/21/2017 CLINICAL DATA:  Focal neuro deficit greater than 6 hours. Suspect stroke. EXAM: CT ANGIOGRAPHY HEAD AND NECK TECHNIQUE: Multidetector CT imaging of the head and neck was performed using the standard protocol during bolus administration of intravenous contrast. Multiplanar CT image reconstructions and MIPs were obtained to evaluate the vascular anatomy. Carotid stenosis measurements (when applicable) are obtained utilizing NASCET criteria, using the distal internal carotid diameter as the denominator. CONTRAST:  13mL ISOVUE-370 IOPAMIDOL (ISOVUE-370) INJECTION 76% COMPARISON:  MRI head 12/07/2013 FINDINGS: CT HEAD FINDINGS Brain: Moderate atrophy. Moderate chronic microvascular ischemic changes in the white matter. Small chronic infarct left occipital lobe unchanged. Negative for acute infarct, hemorrhage, or mass lesion. No midline shift. Vascular: Negative for hyperdense vessel Skull: Negative Sinuses: Paranasal sinuses clear bilaterally. Orbits:  Bilateral cataract surgery. Review of the MIP images confirms the above findings CTA NECK FINDINGS Aortic arch: Suboptimal arterial opacification due to early timing of the scan. Mild atherosclerotic disease in the aortic arch. Proximal great vessels widely patent Right carotid system: Right common carotid artery widely patent. Mild atherosclerotic disease right carotid bifurcation without significant stenosis. Left carotid system: Left common carotid artery widely patent. Mild atherosclerotic disease left carotid bifurcation without significant stenosis Vertebral arteries: Both vertebral arteries appear patent to the basilar. Proximal vertebral arteries not well visualized due to suboptimal opacification and motion. Skeleton:  Moderate cervical spondylosis. Poor dentition with numerous caries and periapical lucency especially left upper molar Other neck: Negative for mass or adenopathy Upper chest: Negative Review of the MIP images confirms the above findings CTA HEAD FINDINGS Anterior circulation: Atherosclerotic calcification and mild stenosis of the cavernous carotid bilaterally. Severe stenosis of the temporal branch M2 bilaterally. Mild atherosclerotic disease in the M1 segment bilaterally with mild irregularity and no significant stenosis. Multiple areas of stenosis in M3 branches bilaterally. Posterior circulation: Both vertebral arteries patent to the basilar without stenosis. Basilar mildly irregular without stenosis. Left PICA patent. Right PICA not visualized. AICA patent bilaterally. Superior cerebellar and posterior cerebral arteries patent bilaterally. Diffuse atherosclerotic disease and mild to moderate stenosis in the posterior cerebral artery bilaterally. Venous sinuses: Lack of venous enhancement due to timing Anatomic variants: None Delayed phase: Not performed Review of the MIP images  confirms the above findings IMPRESSION: Suboptimal CTA technique due to early scanning and suboptimal arterial opacification Mild atherosclerotic disease in the carotid bifurcation bilaterally. No significant carotid or vertebral artery stenosis in the neck. Mild atherosclerotic stenosis in the cavernous carotid bilaterally. Diffuse intracranial atherosclerotic disease with extensive MCA and PCA disease bilaterally. No emergent large vessel occlusion. Atrophy and moderate chronic microvascular ischemia. No acute intracranial abnormality. Electronically Signed   By: Franchot Gallo M.D.   On: 10/21/2017 10:13   Ct Angio Neck W And/or Wo Contrast  Result Date: 10/21/2017 CLINICAL DATA:  Focal neuro deficit greater than 6 hours. Suspect stroke. EXAM: CT ANGIOGRAPHY HEAD AND NECK TECHNIQUE: Multidetector CT imaging of the head and neck was  performed using the standard protocol during bolus administration of intravenous contrast. Multiplanar CT image reconstructions and MIPs were obtained to evaluate the vascular anatomy. Carotid stenosis measurements (when applicable) are obtained utilizing NASCET criteria, using the distal internal carotid diameter as the denominator. CONTRAST:  10mL ISOVUE-370 IOPAMIDOL (ISOVUE-370) INJECTION 76% COMPARISON:  MRI head 12/07/2013 FINDINGS: CT HEAD FINDINGS Brain: Moderate atrophy. Moderate chronic microvascular ischemic changes in the white matter. Small chronic infarct left occipital lobe unchanged. Negative for acute infarct, hemorrhage, or mass lesion. No midline shift. Vascular: Negative for hyperdense vessel Skull: Negative Sinuses: Paranasal sinuses clear bilaterally. Orbits:  Bilateral cataract surgery. Review of the MIP images confirms the above findings CTA NECK FINDINGS Aortic arch: Suboptimal arterial opacification due to early timing of the scan. Mild atherosclerotic disease in the aortic arch. Proximal great vessels widely patent Right carotid system: Right common carotid artery widely patent. Mild atherosclerotic disease right carotid bifurcation without significant stenosis. Left carotid system: Left common carotid artery widely patent. Mild atherosclerotic disease left carotid bifurcation without significant stenosis Vertebral arteries: Both vertebral arteries appear patent to the basilar. Proximal vertebral arteries not well visualized due to suboptimal opacification and motion. Skeleton: Moderate cervical spondylosis. Poor dentition with numerous caries and periapical lucency especially left upper molar Other neck: Negative for mass or adenopathy Upper chest: Negative Review of the MIP images confirms the above findings CTA HEAD FINDINGS Anterior circulation: Atherosclerotic calcification and mild stenosis of the cavernous carotid bilaterally. Severe stenosis of the temporal branch M2 bilaterally.  Mild atherosclerotic disease in the M1 segment bilaterally with mild irregularity and no significant stenosis. Multiple areas of stenosis in M3 branches bilaterally. Posterior circulation: Both vertebral arteries patent to the basilar without stenosis. Basilar mildly irregular without stenosis. Left PICA patent. Right PICA not visualized. AICA patent bilaterally. Superior cerebellar and posterior cerebral arteries patent bilaterally. Diffuse atherosclerotic disease and mild to moderate stenosis in the posterior cerebral artery bilaterally. Venous sinuses: Lack of venous enhancement due to timing Anatomic variants: None Delayed phase: Not performed Review of the MIP images confirms the above findings IMPRESSION: Suboptimal CTA technique due to early scanning and suboptimal arterial opacification Mild atherosclerotic disease in the carotid bifurcation bilaterally. No significant carotid or vertebral artery stenosis in the neck. Mild atherosclerotic stenosis in the cavernous carotid bilaterally. Diffuse intracranial atherosclerotic disease with extensive MCA and PCA disease bilaterally. No emergent large vessel occlusion. Atrophy and moderate chronic microvascular ischemia. No acute intracranial abnormality. Electronically Signed   By: Franchot Gallo M.D.   On: 10/21/2017 10:13   Mr Brain Wo Contrast  Result Date: 10/21/2017 CLINICAL DATA:  Persistent stroke symptoms. Follow-up with thins through the brainstem EXAM: MRI HEAD WITHOUT CONTRAST TECHNIQUE: Multiplanar, multiecho pulse sequences of the brain and surrounding structures were obtained without intravenous  contrast. COMPARISON:  Brain MRI earlier today FINDINGS: Diffusion only imaging shows 5 mm acute infarct along the deep surface of the left brachium pontis. Susceptibility artifact from remote microhemorrhages that is far underestimated relative to previous SWI imaging. IMPRESSION: Diffusion only scan shows a 5 mm acute infarct along the deep surface of the  left brachium pontis. Electronically Signed   By: Monte Fantasia M.D.   On: 10/21/2017 16:34   Mr Brain Wo Contrast (neuro Protocol)  Result Date: 10/21/2017 CLINICAL DATA:  Focal neuro deficit greater than 6 hours, suspect stroke. History of diabetes, hyperlipidemia, hypertension, stroke. EXAM: MRI HEAD WITHOUT CONTRAST TECHNIQUE: Multiplanar, multiecho pulse sequences of the brain and surrounding structures were obtained without intravenous contrast. COMPARISON:  CT head 10/21/2017, MRI head 12/07/2013 FINDINGS: Brain: Negative for acute infarct. Moderate atrophy and moderate chronic microvascular ischemia. Widespread chronic microhemorrhage throughout the brain including the cerebellum, brainstem, and cerebral hemispheres bilaterally. This is extensive and has progressed since 2015. No lobar hemorrhage. 8 x 10 mm mass just below the tentorium laterally on the left is unchanged most compatible with meningioma. This is mildly calcified on CT. Vascular: Normal arterial flow void Skull and upper cervical spine: Negative Sinuses/Orbits: Paranasal sinuses clear. Bilateral cataract surgery. Other: None IMPRESSION: 1. Negative for acute infarct 2. Moderate atrophy and moderate chronic microvascular ischemia 3. Extensive and widespread microcalcifications throughout the cerebellum, brainstem, and both cerebral hemispheres, with progression since 2015. Favor poorly controlled hypertension although cerebral amyloid is also possible. 4. 8 x 10 mm meningioma left lateral tentorium. Unchanged from 2015. Electronically Signed   By: Franchot Gallo M.D.   On: 10/21/2017 11:54   Ct Angio Head W Or Wo Contrast  Result Date: 10/21/2017 CLINICAL DATA:  Focal neuro deficit greater than 6 hours. Suspect stroke. EXAM: CT ANGIOGRAPHY HEAD AND NECK TECHNIQUE: Multidetector CT imaging of the head and neck was performed using the standard protocol during bolus administration of intravenous contrast. Multiplanar CT image  reconstructions and MIPs were obtained to evaluate the vascular anatomy. Carotid stenosis measurements (when applicable) are obtained utilizing NASCET criteria, using the distal internal carotid diameter as the denominator. CONTRAST:  93mL ISOVUE-370 IOPAMIDOL (ISOVUE-370) INJECTION 76% COMPARISON:  MRI head 12/07/2013 FINDINGS: CT HEAD FINDINGS Brain: Moderate atrophy. Moderate chronic microvascular ischemic changes in the white matter. Small chronic infarct left occipital lobe unchanged. Negative for acute infarct, hemorrhage, or mass lesion. No midline shift. Vascular: Negative for hyperdense vessel Skull: Negative Sinuses: Paranasal sinuses clear bilaterally. Orbits:  Bilateral cataract surgery. Review of the MIP images confirms the above findings CTA NECK FINDINGS Aortic arch: Suboptimal arterial opacification due to early timing of the scan. Mild atherosclerotic disease in the aortic arch. Proximal great vessels widely patent Right carotid system: Right common carotid artery widely patent. Mild atherosclerotic disease right carotid bifurcation without significant stenosis. Left carotid system: Left common carotid artery widely patent. Mild atherosclerotic disease left carotid bifurcation without significant stenosis Vertebral arteries: Both vertebral arteries appear patent to the basilar. Proximal vertebral arteries not well visualized due to suboptimal opacification and motion. Skeleton: Moderate cervical spondylosis. Poor dentition with numerous caries and periapical lucency especially left upper molar Other neck: Negative for mass or adenopathy Upper chest: Negative Review of the MIP images confirms the above findings CTA HEAD FINDINGS Anterior circulation: Atherosclerotic calcification and mild stenosis of the cavernous carotid bilaterally. Severe stenosis of the temporal branch M2 bilaterally. Mild atherosclerotic disease in the M1 segment bilaterally with mild irregularity and no significant stenosis.  Multiple  areas of stenosis in M3 branches bilaterally. Posterior circulation: Both vertebral arteries patent to the basilar without stenosis. Basilar mildly irregular without stenosis. Left PICA patent. Right PICA not visualized. AICA patent bilaterally. Superior cerebellar and posterior cerebral arteries patent bilaterally. Diffuse atherosclerotic disease and mild to moderate stenosis in the posterior cerebral artery bilaterally. Venous sinuses: Lack of venous enhancement due to timing Anatomic variants: None Delayed phase: Not performed Review of the MIP images confirms the above findings IMPRESSION: Suboptimal CTA technique due to early scanning and suboptimal arterial opacification Mild atherosclerotic disease in the carotid bifurcation bilaterally. No significant carotid or vertebral artery stenosis in the neck. Mild atherosclerotic stenosis in the cavernous carotid bilaterally. Diffuse intracranial atherosclerotic disease with extensive MCA and PCA disease bilaterally. No emergent large vessel occlusion. Atrophy and moderate chronic microvascular ischemia. No acute intracranial abnormality. Electronically Signed   By: Franchot Gallo M.D.   On: 10/21/2017 10:13   Ct Angio Neck W And/or Wo Contrast  Result Date: 10/21/2017 CLINICAL DATA:  Focal neuro deficit greater than 6 hours. Suspect stroke. EXAM: CT ANGIOGRAPHY HEAD AND NECK TECHNIQUE: Multidetector CT imaging of the head and neck was performed using the standard protocol during bolus administration of intravenous contrast. Multiplanar CT image reconstructions and MIPs were obtained to evaluate the vascular anatomy. Carotid stenosis measurements (when applicable) are obtained utilizing NASCET criteria, using the distal internal carotid diameter as the denominator. CONTRAST:  72mL ISOVUE-370 IOPAMIDOL (ISOVUE-370) INJECTION 76% COMPARISON:  MRI head 12/07/2013 FINDINGS: CT HEAD FINDINGS Brain: Moderate atrophy. Moderate chronic microvascular ischemic  changes in the white matter. Small chronic infarct left occipital lobe unchanged. Negative for acute infarct, hemorrhage, or mass lesion. No midline shift. Vascular: Negative for hyperdense vessel Skull: Negative Sinuses: Paranasal sinuses clear bilaterally. Orbits:  Bilateral cataract surgery. Review of the MIP images confirms the above findings CTA NECK FINDINGS Aortic arch: Suboptimal arterial opacification due to early timing of the scan. Mild atherosclerotic disease in the aortic arch. Proximal great vessels widely patent Right carotid system: Right common carotid artery widely patent. Mild atherosclerotic disease right carotid bifurcation without significant stenosis. Left carotid system: Left common carotid artery widely patent. Mild atherosclerotic disease left carotid bifurcation without significant stenosis Vertebral arteries: Both vertebral arteries appear patent to the basilar. Proximal vertebral arteries not well visualized due to suboptimal opacification and motion. Skeleton: Moderate cervical spondylosis. Poor dentition with numerous caries and periapical lucency especially left upper molar Other neck: Negative for mass or adenopathy Upper chest: Negative Review of the MIP images confirms the above findings CTA HEAD FINDINGS Anterior circulation: Atherosclerotic calcification and mild stenosis of the cavernous carotid bilaterally. Severe stenosis of the temporal branch M2 bilaterally. Mild atherosclerotic disease in the M1 segment bilaterally with mild irregularity and no significant stenosis. Multiple areas of stenosis in M3 branches bilaterally. Posterior circulation: Both vertebral arteries patent to the basilar without stenosis. Basilar mildly irregular without stenosis. Left PICA patent. Right PICA not visualized. AICA patent bilaterally. Superior cerebellar and posterior cerebral arteries patent bilaterally. Diffuse atherosclerotic disease and mild to moderate stenosis in the posterior cerebral  artery bilaterally. Venous sinuses: Lack of venous enhancement due to timing Anatomic variants: None Delayed phase: Not performed Review of the MIP images confirms the above findings IMPRESSION: Suboptimal CTA technique due to early scanning and suboptimal arterial opacification Mild atherosclerotic disease in the carotid bifurcation bilaterally. No significant carotid or vertebral artery stenosis in the neck. Mild atherosclerotic stenosis in the cavernous carotid bilaterally. Diffuse intracranial atherosclerotic disease with extensive  MCA and PCA disease bilaterally. No emergent large vessel occlusion. Atrophy and moderate chronic microvascular ischemia. No acute intracranial abnormality. Electronically Signed   By: Franchot Gallo M.D.   On: 10/21/2017 10:13   Mr Brain Wo Contrast  Result Date: 10/21/2017 CLINICAL DATA:  Persistent stroke symptoms. Follow-up with thins through the brainstem EXAM: MRI HEAD WITHOUT CONTRAST TECHNIQUE: Multiplanar, multiecho pulse sequences of the brain and surrounding structures were obtained without intravenous contrast. COMPARISON:  Brain MRI earlier today FINDINGS: Diffusion only imaging shows 5 mm acute infarct along the deep surface of the left brachium pontis. Susceptibility artifact from remote microhemorrhages that is far underestimated relative to previous SWI imaging. IMPRESSION: Diffusion only scan shows a 5 mm acute infarct along the deep surface of the left brachium pontis. Electronically Signed   By: Monte Fantasia M.D.   On: 10/21/2017 16:34   Mr Brain Wo Contrast (neuro Protocol)  Result Date: 10/21/2017 CLINICAL DATA:  Focal neuro deficit greater than 6 hours, suspect stroke. History of diabetes, hyperlipidemia, hypertension, stroke. EXAM: MRI HEAD WITHOUT CONTRAST TECHNIQUE: Multiplanar, multiecho pulse sequences of the brain and surrounding structures were obtained without intravenous contrast. COMPARISON:  CT head 10/21/2017, MRI head 12/07/2013 FINDINGS:  Brain: Negative for acute infarct. Moderate atrophy and moderate chronic microvascular ischemia. Widespread chronic microhemorrhage throughout the brain including the cerebellum, brainstem, and cerebral hemispheres bilaterally. This is extensive and has progressed since 2015. No lobar hemorrhage. 8 x 10 mm mass just below the tentorium laterally on the left is unchanged most compatible with meningioma. This is mildly calcified on CT. Vascular: Normal arterial flow void Skull and upper cervical spine: Negative Sinuses/Orbits: Paranasal sinuses clear. Bilateral cataract surgery. Other: None IMPRESSION: 1. Negative for acute infarct 2. Moderate atrophy and moderate chronic microvascular ischemia 3. Extensive and widespread microcalcifications throughout the cerebellum, brainstem, and both cerebral hemispheres, with progression since 2015. Favor poorly controlled hypertension although cerebral amyloid is also possible. 4. 8 x 10 mm meningioma left lateral tentorium. Unchanged from 2015. Electronically Signed   By: Franchot Gallo M.D.   On: 10/21/2017 11:54      EKG: Independently reviewed.   Sinus bradycardia with a rate of 55  Assessment/Plan Principal Problem:   Acute CVA (cerebrovascular accident) [facility]) Patient admitted for. Workup 2-D echo Carotid Doppler PT/OT/speech Aspirin 325 milligrams a day  Lipid panel , hemoglobin A1c Neurology consult Permissive hypertension to keep systolic blood pressure greater than 180 but less than 220 Hypodensity statin  Diabetes mellitus Patient is on glipizide and metformin Check hemoglobin A1c Sliding scale insulin     Essential hypertension Hold Cozaar, hold HCTZ, to allow permissive hypertension in the setting of acute CVA    dyslipidemia-continue statin,  On lovastatin at home, switch to Lipitor or Crestor     DVT prophylaxis:  Lovenox     Code Status Orders  (From admission, onward)         Start     Ordered   10/21/17 1721  Full code   Continuous     10/21/17 1724        Code Status History    Date Active Date Inactive Code Status Order ID Comments User Context   12/07/2013 0116 12/09/2013 1552 Full Code [id number]  Toy Baker, MD Inpatient       consults called: neurology  Family Communication: Admission, patients condition and plan of care including tests being ordered have been discussed with the patient  who indicates understanding and agree with the plan and  Code Status   Admission status:  The appropriate patient status for this patient is INPATIENT. Inpatient status is judged to be reasonable and necessary in order to provide the required intensity of service to ensure the patient's safety. The patient's presenting symptoms, physical exam findings, and initial radiographic and laboratory data in the context of their chronic comorbidities is felt to place them at high risk for further clinical deterioration. Furthermore, it is not anticipated that the patient will be medically stable for discharge from the hospital within 2 midnights of admission. The following factors support the patient status of inpatient.    "           The patient's presenting symptoms include vertigo, dizziness secondary to acute CVA     * I certify that at the point of admission it is my clinical judgment that the patient will require inpatient hospital care spanning beyond 2 midnights from the point of admission due to high intensity of service, high risk for further deterioration and high frequency of surveillance required.*    Disposition plan: Further plan will depend as patient's clinical course evolves and further radiologic and laboratory data become available. Likely home when stable    At the time of admission, it appears that the appropriate admission status for this patient is INPATIENT . This is judged to be reasonable and necessary in order to provide the required intensity of service to ensure the patient's safety  given the presenting symptoms, physical exam findings, and initial radiographic and laboratory data in the context of their chronic comorbidities.   Reyne Dumas MD [facility] Pager [phone]  If 7PM-7AM, please contact night-coverage [email or web address] Password [facility]  10/21/2017, 5:24 PM

## 2017-10-21 NOTE — ED Notes (Signed)
Patient transported to MRI 

## 2017-10-21 NOTE — ED Notes (Signed)
Patient transported to CT 

## 2017-10-22 ENCOUNTER — Encounter (HOSPITAL_COMMUNITY): Payer: PPO

## 2017-10-22 ENCOUNTER — Other Ambulatory Visit: Payer: Self-pay

## 2017-10-22 ENCOUNTER — Inpatient Hospital Stay (HOSPITAL_COMMUNITY): Payer: PPO

## 2017-10-22 ENCOUNTER — Encounter (HOSPITAL_COMMUNITY): Payer: Self-pay | Admitting: General Practice

## 2017-10-22 ENCOUNTER — Other Ambulatory Visit (HOSPITAL_COMMUNITY): Payer: PPO

## 2017-10-22 DIAGNOSIS — N1831 Chronic kidney disease, stage 3a: Secondary | ICD-10-CM

## 2017-10-22 DIAGNOSIS — I639 Cerebral infarction, unspecified: Secondary | ICD-10-CM

## 2017-10-22 DIAGNOSIS — E119 Type 2 diabetes mellitus without complications: Secondary | ICD-10-CM

## 2017-10-22 DIAGNOSIS — D62 Acute posthemorrhagic anemia: Secondary | ICD-10-CM

## 2017-10-22 DIAGNOSIS — R270 Ataxia, unspecified: Secondary | ICD-10-CM

## 2017-10-22 DIAGNOSIS — I10 Essential (primary) hypertension: Secondary | ICD-10-CM

## 2017-10-22 DIAGNOSIS — N183 Chronic kidney disease, stage 3 unspecified: Secondary | ICD-10-CM

## 2017-10-22 DIAGNOSIS — E785 Hyperlipidemia, unspecified: Secondary | ICD-10-CM

## 2017-10-22 DIAGNOSIS — R42 Dizziness and giddiness: Secondary | ICD-10-CM

## 2017-10-22 DIAGNOSIS — Z8673 Personal history of transient ischemic attack (TIA), and cerebral infarction without residual deficits: Secondary | ICD-10-CM

## 2017-10-22 LAB — GLUCOSE, CAPILLARY
Glucose-Capillary: 50 mg/dL — ABNORMAL LOW (ref 70–99)
Glucose-Capillary: 59 mg/dL — ABNORMAL LOW (ref 70–99)
Glucose-Capillary: 59 mg/dL — ABNORMAL LOW (ref 70–99)
Glucose-Capillary: 207 mg/dL — ABNORMAL HIGH (ref 70–99)
Glucose-Capillary: 112 mg/dL — ABNORMAL HIGH (ref 70–99)
Glucose-Capillary: 128 mg/dL — ABNORMAL HIGH (ref 70–99)
Glucose-Capillary: 131 mg/dL — ABNORMAL HIGH (ref 70–99)

## 2017-10-22 LAB — TROPONIN I
Troponin I: <0.03 ng/mL (ref <0.03)
Troponin I: <0.03 ng/mL (ref <0.03)

## 2017-10-22 LAB — LIPID PANEL
Cholesterol: 140 mg/dL (ref 0–200)
HDL: 34 mg/dL — ABNORMAL LOW (ref >40)
LDL Cholesterol: 82 mg/dL (ref 0–99)
Total CHOL/HDL Ratio: 4.1 RATIO
Triglycerides: 122 mg/dL (ref <150)
VLDL: 24 mg/dL (ref 0–40)

## 2017-10-22 LAB — HEMOGLOBIN A1C
Hgb A1c MFr Bld: 6.2 % — ABNORMAL HIGH (ref 4.8–5.6)
Mean Plasma Glucose: 131.24 mg/dL

## 2017-10-22 LAB — ECHOCARDIOGRAM COMPLETE

## 2017-10-22 LAB — HIV ANTIBODY (ROUTINE TESTING W REFLEX): HIV Screen 4th Generation wRfx: NONREACTIVE

## 2017-10-22 MED ORDER — CLOPIDOGREL BISULFATE 75 MG PO TABS
75.0000 mg | ORAL_TABLET | Freq: Every day | ORAL | Status: DC
  Administered 2017-10-22 – 2017-10-24 (×3): 75 mg via ORAL
  Filled 2017-10-22 (×3): qty 1

## 2017-10-22 MED ORDER — DEXTROSE 50 % IV SOLN
50.0000 mL | Freq: Once | INTRAVENOUS | Status: AC
  Administered 2017-10-22: 50 mL via INTRAVENOUS

## 2017-10-22 MED ORDER — DEXTROSE 50 % IV SOLN
INTRAVENOUS | Status: AC
  Filled 2017-10-22: qty 50

## 2017-10-22 NOTE — Progress Notes (Signed)
Occupational Therapy Note  OT eval completed, full note to follow.  Pt demonstrates impaired balance, Lt sided ataxia, and requires min - mod A for ADLs.  Recommend CIR consult.      10/22/17 1156  OT Visit Information  Last OT Received On 10/22/17  OT Recommendation  Recommendations for Other Services Rehab consult  Follow Up Recommendations CIR;Supervision/Assistance - 24 hour  OT Equipment Tub/shower bench  Lucille Passy, OTR/L [place] Pager [phone] Office [phone]

## 2017-10-22 NOTE — Progress Notes (Signed)
  Echocardiogram 2D Echocardiogram has been performed.  James Henderson 10/22/2017, 3:44 PM

## 2017-10-22 NOTE — Evaluation (Signed)
Occupational Therapy Evaluation Patient Details Name: James Henderson MRN: [id number] DOB: [birth year] Today's Date: 10/22/2017    History of Present Illness 67 y.o. male admitted on 10/21/17 for difficulting ambulating, N/V, HA, generalized weakness.  MRI revealed Left brachium pontis infarct secondary to small vessel disease.  Pt with significant PMH of stroke, HTN, glaucoma, DM, and eye surgery.      Pt admitted with above. He demonstrates the below listed deficits and will benefit from continued OT to maximize safety and independence with BADLs.  Pt presents to OT with Lt sided ataxia, impaired balance, decreased trunk control, impaired vision.   He requires set up - mod A for ADLs.   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  Feel he will benefit from CIR as he needs a therapy team that specializes in stroke rehab so that he may maximize safety and independence with ADLs.     Follow Up Recommendations  CIR;Supervision/Assistance - 24 hour    Equipment Recommendations  Tub/shower bench    Recommendations for Other Services Rehab consult     Precautions / Restrictions Precautions Precautions: Fall Precaution Comments: left lateral lean      Mobility Bed Mobility Overal bed mobility: Needs Assistance Bed Mobility: Sit to Supine       Sit to supine: Min assist   General bed mobility comments: Min assist to help control descent to bed.   Transfers Overall transfer level: Needs assistance Equipment used: None Transfers: Sit to/from [facility] Sit to Stand: +2 safety/equipment;Mod assist Stand pivot transfers: +2 safety/equipment;Mod assist       General transfer comment: Two person mod assist to stand due to left lateral trunk lean.  Assist needed to turn and sucessfully control descent down to the bed on the pt's right side.     Balance Overall balance assessment: Needs assistance Sitting-balance support: Feet supported;Bilateral upper  extremity supported Sitting balance-Leahy Scale: Poor Sitting balance - Comments: left lateral lean in sitting EOB.  Min assist to help pt come to midline.  Postural control: Left lateral lean Standing balance support: Bilateral upper extremity supported Standing balance-Leahy Scale: Poor Standing balance comment: mod external assist in standing, also with left lateral lean.                            ADL either performed or assessed with clinical judgement   ADL                                               Vision         Perception     Praxis      Pertinent Vitals/Pain Pain Assessment: No/denies pain     Hand Dominance Right   Extremity/Trunk Assessment Upper Extremity Assessment Upper Extremity Assessment: Defer to OT evaluation   Lower Extremity Assessment Lower Extremity Assessment: LLE deficits/detail LLE Deficits / Details: left leg very slightly weaker than right leg and more uncoordinated LLE Sensation: WNL LLE Coordination: decreased gross motor   Cervical / Trunk Assessment Cervical / Trunk Assessment: Other exceptions Cervical / Trunk Exceptions: left lateral trunk lean in sitting and in standing.    Communication Communication Communication: No difficulties(has a heavy forein accent)   Cognition Arousal/Alertness: Awake/alert Behavior During Therapy: WFL for tasks assessed/performed Overall Cognitive Status: Impaired/Different from baseline  Area of Impairment: Awareness;Safety/judgement                         Safety/Judgement: Decreased awareness of safety;Decreased awareness of deficits Awareness: Emergent   General Comments: Pt seems aware of his balance deficit, however, did not feel it to be difficult to get up earlier (it was quite a bit difficult).    General Comments       Exercises     Shoulder Instructions      Home Living Family/patient expects to be discharged to:: Private  residence Living Arrangements: Spouse/significant other Available Help at Discharge: Family;Available 24 hours/day Type of Home: House Home Access: Level entry;Stairs to enter Entrance Stairs-Number of Steps: 1 Entrance Stairs-Rails: None Home Layout: One level     Bathroom Shower/Tub: Occupational psychologist: Standard     Home Equipment: Shower seat          Prior Functioning/Environment Level of Independence: Independent        Comments: works part time in maintenance         OT Problem List:        OT Treatment/Interventions:      OT Goals(Current goals can be found in the care plan section) Acute Rehab OT Goals Patient Stated Goal: to retrun to work  OT Frequency:     Barriers to D/C:            Co-evaluation              AM-PAC PT "6 Clicks" Daily Activity     Outcome Measure                 End of Session    Activity Tolerance:   Patient left:                     Time:  -    Charges:     Lucille Passy, OTR/L [place] Pager [phone] Office [place], [place] 10/22/2017, 3:29 PM

## 2017-10-22 NOTE — Progress Notes (Signed)
0623 Patient blood sugar 50 this morning.Patient asymptomatic 1 pack of graham crackers and  1 cup of ginger ale given.[id number] Rechecked  Blood sugar 59 patient remains asymptomatic 1 pack of graham crackers and another cup of ginger ale given to patient.

## 2017-10-22 NOTE — Progress Notes (Addendum)
PROGRESS NOTE  James Henderson MCN:[id number] DOB: Sep 20, 1950 DOA: 10/21/2017 PCP: Lois Huxley, PA  HPI/Recap of past 24 hours: 67 year old male with a prior history of stroke in 2015, hypertension, dyslipidemia, diabetes mellitus presents to the ED today because of difficulty ambulating , nausea vomiting 1 day associated with a headache and generalized weakness.patient denied any slurred speech , unilateral weakness. Yesterday afternoon he noted himself to be off balance and staggering. Concern that he may have had another stroke and came to the hospital.   MRI revealed small cerebellar infarct and multiple microhemorrhages.  10/22/2017: Patient seen and examined at his bedside he reports feeling weak when he sits up.  PT assessed and recommended CIR.  Denies chest pain, palpitations or dyspnea.  Assessment/Plan: Principal Problem:   Acute CVA (cerebrovascular accident) ([place]) Active Problems:   Essential hypertension   Dizziness   DM (diabetes mellitus) type II controlled, neurological manifestation (HCC)   HLD (hyperlipidemia)  Small cerebellar infarct Unclear etiology Has had previous lacunar infarcts Neurology recommends plavix alone for stroke prevention due to multiple microhemorrhages on his MRI brain 2D echo normal LVEF, normal wall motion, no PFO Twelve-lead EKG independently reviewed sinus bradycardia with rate of 55 LDL 82 goal less than 70 A1c 6.2 Continue plavix Continue high intensity statin  Sinus bradycardia Heart rate fluctuates between 53 and 57 Continue to monitor on telemetry Repeat twelve-lead EKG in the morning  Hypertension Blood pressure stable Permissive hypertension Treat systolic blood pressure greater then 629 and diastolic blood pressure greater than 120   CKD 3 Appears to be at his baseline creatinine 1.45 and GFR 55 Continue gentle IV fluid hydration Encourage oral fluid intake Continue to monitor urine output Avoid nephrotoxic  agents/dehydration/hypotension Repeat BMP in the morning  Hyperlipidemia Continue Crestor  Ambulatory dysfunction post stroke PT assessed and recommended CIR Fall precautions    Code Status: Full code  Family Communication: None at bedside  Disposition Plan: Discharge to CIR 10/23/2017 if insurance approves   Consultants:  CIR  Neurology  Procedures:  None  Antimicrobials:  None  DVT prophylaxis: Subcu Lovenox daily   Objective: Vitals:   10/22/17 0008 10/22/17 0455 10/22/17 1418 10/22/17 1643  BP: 127/69 130/71 (!) 143/78 (!) 160/76  Pulse: (!) 56 (!) 56 (!) 58 (!) 53  Resp: 18 18 18  (!) 26  Temp: 98.9 F (37.2 C) 98.8 F (37.1 C) 99.5 F (37.5 C) 98.8 F (37.1 C)  TempSrc: Oral Oral Oral Oral  SpO2: 99% 98% 98% 100%    Intake/Output Summary (Last 24 hours) at 10/22/2017 1751 Last data filed at 10/22/2017 1645 Gross per 24 hour  Intake 420 ml  Output 650 ml  Net -230 ml   There were no vitals filed for this visit.  Exam:  . General: 67 y.o. year-old male well developed well nourished in no acute distress.  Alert and oriented x3. . Cardiovascular: Regular rate and rhythm with no rubs or gallops.  No thyromegaly or JVD noted.   Marland Kitchen Respiratory: Clear to auscultation with no wheezes or rales. Good inspiratory effort. . Abdomen: Soft nontender nondistended with normal bowel sounds x4 quadrants. . Musculoskeletal: No lower extremity edema. 2/4 pulses in all 4 extremities. . Skin: No ulcerative lesions noted or rashes, . Psychiatry: Mood is appropriate for condition and setting   Data Reviewed: CBC: Recent Labs  Lab 10/21/17 0717 10/21/17 1758  WBC 7.4 8.3  NEUTROABS 6.0  --   HGB 13.5 12.9*  HCT 42.0 40.2  MCV 96.3 96.2  PLT 227 751   Basic Metabolic Panel: Recent Labs  Lab 10/21/17 0717 10/21/17 1758  NA 144  --   K 4.1  --   CL 111  --   CO2 25  --   GLUCOSE 206*  --   BUN 13  --   CREATININE 1.45* 1.35*  CALCIUM 9.3  --     GFR: CrCl cannot be calculated (Unknown ideal weight.). Liver Function Tests: No results for input(s): AST, ALT, ALKPHOS, BILITOT, PROT, ALBUMIN in the last 168 hours. No results for input(s): LIPASE, AMYLASE in the last 168 hours. No results for input(s): AMMONIA in the last 168 hours. Coagulation Profile: Recent Labs  Lab 10/21/17 0842  INR 1.06   Cardiac Enzymes: Recent Labs  Lab 10/21/17 1758 10/21/17 2320 10/22/17 0445  TROPONINI <0.03 <0.03 <0.03   BNP (last 3 results) No results for input(s): PROBNP in the last 8760 hours. HbA1C: Recent Labs    10/22/17 0445  HGBA1C 6.2*   CBG: Recent Labs  Lab 10/22/17 0623 10/22/17 0647 10/22/17 0706 10/22/17 0735 10/22/17 1130  GLUCAP 50* 59* 59* 207* 112*   Lipid Profile: Recent Labs    10/22/17 0445  CHOL 140  HDL 34*  LDLCALC 82  TRIG 122  CHOLHDL 4.1   Thyroid Function Tests: No results for input(s): TSH, T4TOTAL, FREET4, T3FREE, THYROIDAB in the last 72 hours. Anemia Panel: No results for input(s): VITAMINB12, FOLATE, FERRITIN, TIBC, IRON, RETICCTPCT in the last 72 hours. Urine analysis:    Component Value Date/Time   COLORURINE YELLOW 10/21/2017 1036   APPEARANCEUR CLEAR 10/21/2017 1036   LABSPEC 1.040 (H) 10/21/2017 1036   PHURINE 5.0 10/21/2017 1036   GLUCOSEU 50 (A) 10/21/2017 1036   HGBUR MODERATE (A) 10/21/2017 1036   BILIRUBINUR NEGATIVE 10/21/2017 1036   KETONESUR NEGATIVE 10/21/2017 1036   PROTEINUR NEGATIVE 10/21/2017 1036   UROBILINOGEN 0.2 12/07/2013 0032   NITRITE NEGATIVE 10/21/2017 1036   LEUKOCYTESUR NEGATIVE 10/21/2017 1036   Sepsis Labs: @LABRCNTIP (procalcitonin:4,lacticidven:4)  )No results found for this or any previous visit (from the past 240 hour(s)).    Studies: Dg Chest 2 View  Result Date: 10/21/2017 CLINICAL DATA:  Patient with vomiting.  Headache.  Weakness. EXAM: CHEST - 2 VIEW COMPARISON:  None. FINDINGS: Monitoring leads overlie the patient. Normal cardiac  and mediastinal contours. Low lung volumes. Bibasilar heterogeneous pulmonary opacities. No pleural effusion or pneumothorax. Thoracic spine degenerative changes. Possible nodular opacity left upper lung. IMPRESSION: Low lung volumes with bibasilar opacities favored to represent atelectasis. Infection not excluded. Possible nodular opacity left upper lung. Recommend further evaluation with chest CT. Electronically Signed   By: Lovey Newcomer M.D.   On: 10/21/2017 18:17    Scheduled Meds: .  stroke: mapping our early stages of recovery book   Does not apply Once  . cholecalciferol  5,000 Units Oral Daily  . clopidogrel  75 mg Oral Daily  . enoxaparin (LOVENOX) injection  40 mg Subcutaneous Q24H  . insulin aspart  0-15 Units Subcutaneous TID WC  . rosuvastatin  40 mg Oral q1800  . timolol  1 drop Both Eyes Daily  . Travoprost (BAK Free)  1 drop Both Eyes QHS    Continuous Infusions: . sodium chloride 100 mL/hr at 10/21/17 2302     LOS: 1 day     Kayleen Memos, MD [facility] Pager [phone]  If 7PM-7AM, please contact night-coverage [email or web address] Password [email or web address] 10/22/2017, 5:51 PM

## 2017-10-22 NOTE — Evaluation (Signed)
Physical Therapy Evaluation Patient Details Name: James Henderson MRN: [id number] DOB: February 27, 1950 Today's Date: 10/22/2017   History of Present Illness  67 y.o. male admitted on 10/21/17 for difficulting ambulating, N/V, HA, generalized weakness.  MRI revealed Left brachium pontis infarct secondary to small vessel disease.  Pt with significant PMH of stroke, HTN, glaucoma, DM, and eye surgery.    Clinical Impression  Pt with some very mild left sided weakness and incoordination, left lateral lean in sitting and standing making him a mod assist to transfer OOB to chair.  He would need a second person to attempt gait safely.  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  He is motivated to get back to work.   PT to follow acutely for deficits listed below.       Follow Up Recommendations CIR    Equipment Recommendations  Rolling walker with 5" wheels    Recommendations for Other Services Rehab consult     Precautions / Restrictions Precautions Precautions: Fall Precaution Comments: left lateral lean      Mobility  Bed Mobility Overal bed mobility: Needs Assistance Bed Mobility: Sit to Supine       Sit to supine: Min assist   General bed mobility comments: Min assist to help control descent to bed.   Transfers Overall transfer level: Needs assistance Equipment used: None Transfers: Sit to/from [facility] Sit to Stand: +2 safety/equipment;Mod assist Stand pivot transfers: +2 safety/equipment;Mod assist       General transfer comment: Two person mod assist to stand due to left lateral trunk lean.  Assist needed to turn and sucessfully control descent down to the bed on the pt's right side.   Ambulation/Gait             General Gait Details: would be safer to attempt with second person.  Not attempted today as echo was in room setting up for their  exam.      Modified Rankin (Stroke Patients Only) Modified Rankin (Stroke Patients Only) Pre-Morbid Rankin Score: No symptoms Modified Rankin: Moderately severe disability     Balance Overall balance assessment: Needs assistance Sitting-balance support: Feet supported;Bilateral upper extremity supported Sitting balance-Leahy Scale: Poor Sitting balance - Comments: left lateral lean in sitting EOB.  Min assist to help pt come to midline.  Postural control: Left lateral lean Standing balance support: Bilateral upper extremity supported Standing balance-Leahy Scale: Poor Standing balance comment: mod external assist in standing, also with left lateral lean.                              Pertinent Vitals/Pain Pain Assessment: No/denies pain    Home Living Family/patient expects to be discharged to:: Private residence Living Arrangements: Spouse/significant other Available Help at Discharge: Family;Available 24 hours/day Type of Home: House Home Access: Level entry;Stairs to enter Entrance Stairs-Rails: None Entrance Stairs-Number of Steps: 1 Home Layout: One level Home Equipment: Shower seat      Prior Function Level of Independence: Independent         Comments: works part time in [place] Hand: Right    Extremity/Trunk Assessment   Upper Extremity Assessment Upper Extremity Assessment: Defer to OT evaluation    Lower Extremity Assessment Lower Extremity Assessment: LLE deficits/detail LLE Deficits /  Details: left leg very slightly weaker than right leg and more uncoordinated LLE Sensation: WNL LLE Coordination: decreased gross motor    Cervical / Trunk Assessment Cervical / Trunk Assessment: Other exceptions Cervical / Trunk Exceptions: left lateral trunk lean in sitting and in standing.   Communication   Communication: No difficulties(has a heavy forein accent)  Cognition Arousal/Alertness:  Awake/alert Behavior During Therapy: WFL for tasks assessed/performed Overall Cognitive Status: Impaired/Different from baseline Area of Impairment: Awareness;Safety/judgement                         Safety/Judgement: Decreased awareness of safety;Decreased awareness of deficits Awareness: Emergent   General Comments: Pt seems aware of his balance deficit, however, did not feel it to be difficult to get up earlier (it was quite a bit difficult).              Assessment/Plan    PT Assessment Patient needs continued PT services  PT Problem List Decreased strength;Decreased activity tolerance;Decreased mobility;Decreased coordination;Decreased balance;Decreased knowledge of use of DME;Decreased cognition;Decreased safety awareness;Decreased knowledge of precautions       PT Treatment Interventions DME instruction;Gait training;Stair training;Functional mobility training;Therapeutic exercise;Therapeutic activities;Balance training;Cognitive remediation;Neuromuscular re-education;Patient/family education    PT Goals (Current goals can be found in the Care Plan section)  Acute Rehab PT Goals Patient Stated Goal: to retrun to work PT Goal Formulation: With patient Time For Goal Achievement: 11/05/17 Potential to Achieve Goals: Good    Frequency Min 4X/week           AM-PAC PT "6 Clicks" Daily Activity  Outcome Measure Difficulty turning over in bed (including adjusting bedclothes, sheets and blankets)?: Unable Difficulty moving from lying on back to sitting on the side of the bed? : Unable Difficulty sitting down on and standing up from a chair with arms (e.g., wheelchair, bedside commode, etc,.)?: Unable Help needed moving to and from a bed to chair (including a wheelchair)?: A Lot Help needed walking in hospital room?: A Lot Help needed climbing 3-5 steps with a railing? : Total 6 Click Score: 8    End of Session Equipment Utilized During Treatment: Gait  belt Activity Tolerance: Patient tolerated treatment well Patient left: in bed;with call bell/phone within reach;Other (comment)(with echo tech in the room. ) Nurse Communication: Mobility status PT Visit Diagnosis: Muscle weakness (generalized) (M62.81);Ataxic gait (R26.0);Difficulty in walking, not elsewhere classified (R26.2);Hemiplegia and hemiparesis Hemiplegia - Right/Left: Left Hemiplegia - dominant/non-dominant: Non-dominant Hemiplegia - caused by: Cerebral infarction    Time: 1324-4010 PT Time Calculation (min) (ACUTE ONLY): 12 min   Charges:          Wells Guiles B. Thula Stewart, PT, DPT  Acute Rehabilitation #[phone] pager #[phone] office   PT Evaluation $PT Eval Moderate Complexity: 1 Mod          10/22/2017, 2:52 PM

## 2017-10-22 NOTE — Progress Notes (Signed)
0713 1 amp D 50 given per protocol .4237 Blood sugar up 207.

## 2017-10-22 NOTE — Consult Note (Signed)
Physical Medicine and Rehabilitation Consult   Reason for Consult: Functional decline due to stroke Referring Physician: Dr. Leonie Man.   HPI: James Henderson is a 67 y.o. male with history of T2DM, HTN, glaucoma, prior left mid brain stroke who was admitted on 10/21/17 with one day history of difficulty walking, multiple bouts of emesis, HA and weakness. CT head reviewed, unremarkable for acute intracranial process. MRI brain done revealing left brachium pontis infarct.  Work up ongoing and Dr. Leonie Man felt that stroke was due to small vessel disease.  ASA changed to Plavix alone due to microhemorrhages and therapy evaluations completed today. CIR recommended due to balance deficits with ataxia affecting mobility and ADLs.   Review of Systems  Constitutional: Negative for chills and fever.  HENT: Negative for hearing loss.   Eyes: Positive for blurred vision. Negative for double vision, photophobia and pain.  Respiratory: Negative for cough and shortness of breath.   Cardiovascular: Negative for chest pain and palpitations.  Gastrointestinal: Negative for abdominal pain, heartburn and nausea.  Genitourinary: Positive for frequency (chronic) and urgency.  Musculoskeletal: Negative for back pain, joint pain and myalgias.  Skin: Negative for rash.  Neurological: Positive for dizziness. Negative for tingling and headaches.  Psychiatric/Behavioral: The patient is not nervous/anxious and does not have insomnia.   All other systems reviewed and are negative.    Past Medical History:  Diagnosis Date  . Brainstem stroke ([place]) 2015  . Diabetes mellitus without complication ([place])   . Glaucoma   . Hyperlipidemia   . Hypertension     Past Surgical History:  Procedure Laterality Date  . EYE SURGERY      Family History  Problem Relation Age of Onset  . Stroke Mother   . Stroke Brother   . Diabetes type II Brother     Social History:   Married. 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 Wife has health problems.  He reports that he has quit smoking in 1986. He has never used smokeless tobacco. He reports that he does not drink alcohol or use drugs.   Allergies: No Known Allergies   Medications Prior to Admission  Medication Sig Dispense Refill  . acetaminophen (TYLENOL) 500 MG tablet Take 1,000 mg by mouth every 6 (six) hours as needed for headache (pain).    Marland Kitchen aspirin EC 81 MG tablet Take 81 mg by mouth daily.    . Brinzolamide-Brimonidine (SIMBRINZA) 1-0.2 % SUSP Place 1 drop into both eyes 2 (two) times daily.     . Cholecalciferol (VITAMIN D3) 400 units CAPS Take 400 Units by mouth every evening.    . fluticasone (FLONASE) 50 MCG/ACT nasal spray Place 1 spray into both nostrils daily as needed for allergies or rhinitis.    Marland Kitchen glipiZIDE (GLUCOTROL) 5 MG tablet Take 5 mg by mouth daily.    . Inositol Niacinate (NIACIN FLUSH FREE) 500 MG CAPS Take 500 mg by mouth every evening.    . latanoprost (XALATAN) 0.005 % ophthalmic solution Place 1 drop into both eyes at bedtime.  1  . losartan (COZAAR) 100 MG tablet Take 100 mg by mouth daily.  1  . lovastatin (MEVACOR) 40 MG tablet Take 40 mg by mouth daily. with food  1  . metFORMIN (GLUCOPHAGE) 1000 MG tablet Take 1,000 mg by mouth daily with supper.  1  . Omega-3 Fatty Acids (FISH OIL) 1000 MG CAPS Take 1,000 mg by mouth every evening.    Vladimir Faster Glycol-Propyl Glycol (  SYSTANE ULTRA) 0.4-0.3 % SOLN Place 1 drop into both eyes daily as needed (dryness or itching).    . timolol (BETIMOL) 0.5 % ophthalmic solution Place 1 drop into both eyes daily.     . brimonidine (ALPHAGAN) 0.2 % ophthalmic solution Place 1 drop into both eyes 3 (three) times daily.  4  . dorzolamide (TRUSOPT) 2 % ophthalmic solution Place 1 drop into both eyes 2 (two) times daily.  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  Transfers Overall transfer level: Needs assistance Equipment used: None Transfers: Sit to/from Stand, Stand Pivot Transfers Sit to Stand: +2 safety/equipment, Mod assist Stand pivot transfers: +2 safety/equipment, Mod assist General transfer comment: Two person mod assist to stand due to left lateral trunk lean.  Assist needed to turn and sucessfully control descent down to the bed on the pt's right side.  Ambulation/Gait General Gait Details: would be safer to attempt with second person.  Not attempted today as echo was in room setting up for their exam.     ADL: ADL Overall ADL's : Needs assistance/impaired Eating/Feeding: Modified independent, Sitting Grooming: Wash/dry hands, Wash/dry face, Oral care, Brushing hair, Set up, Sitting Upper Body Bathing: Set up, Sitting, Moderate assistance Upper Body Bathing Details (indicate cue type and reason): assist for balance  Lower Body Bathing: Moderate assistance, Sit to/from stand Upper Body Dressing : Moderate assistance, Sitting Upper Body Dressing Details (indicate cue type and reason): assist for balance  Lower Body Dressing: Moderate assistance, Sit to/from stand Toilet Transfer: Minimal assistance, Stand-pivot, BSC Toileting- Clothing Manipulation and Hygiene: Moderate assistance, Sit to/from stand Functional mobility  during ADLs: Minimal assistance(stand pivot transfer only ) General ADL Comments: Pt frequently looses balance to the Lt when he attempts to use UEs   Cognition: Cognition Overall Cognitive Status: Impaired/Different from baseline Orientation Level: Oriented X4 Cognition Arousal/Alertness: Awake/alert Behavior During Therapy: WFL for tasks assessed/performed Overall Cognitive Status: Impaired/Different from baseline Area of Impairment: Awareness, Safety/judgement Current Attention Level: Selective Safety/Judgement: Decreased awareness of safety, Decreased awareness of deficits Awareness: Emergent Problem Solving: Requires verbal cues General Comments: Pt seems aware of his balance deficit, however, did not feel it to be difficult to get up earlier (it was quite a bit difficult).    Blood pressure (!) 143/78, pulse (!) 58, temperature 99.5 F (37.5 C), temperature source Oral, resp. rate 18, SpO2 98 %. Physical Exam  Nursing note and vitals reviewed. Constitutional: He is oriented to person, place, and time. He appears well-developed and well-nourished.  HENT:  Head: Normocephalic and atraumatic.  Eyes: EOM are normal. Right eye exhibits no discharge. Left eye exhibits no discharge.  Neck: Normal range of motion. Neck supple.  Cardiovascular: Normal rate and regular rhythm.  Respiratory: Effort normal and breath sounds normal.  GI: Soft. Bowel sounds are normal.  Musculoskeletal:  No edema or tenderness in extremities  Neurological: He is alert and oriented to person, place, and time.  Able to follow simple one and two step commands without difficulty.  Decrease in fine motor movements Motor: RUE/RLE: 4-4+/5 proximal to distal LUE/LLE: 5/5 proximal to distal  Skin: Skin is warm and dry.  Psychiatric: His speech is delayed. He is slowed.    Results for orders placed or performed during the hospital encounter of 10/21/17 (from the past 24 hour(s))  HIV antibody (Routine  Testing)     Status: None   Collection Time: 10/21/17  5:58 PM  Result Value Ref Range   HIV Screen 4th Generation wRfx Non Reactive Non Reactive  CBC     Status: Abnormal   Collection Time: 10/21/17  5:58 PM  Result Value Ref Range   WBC 8.3 4.0 - 10.5 K/uL   RBC 4.18 (L) 4.22 - 5.81 MIL/uL   Hemoglobin 12.9 (L) 13.0 - 17.0 g/dL   HCT 40.2 39.0 - 52.0 %   MCV 96.2 78.0 - 100.0 fL   MCH 30.9 26.0 - 34.0 pg   MCHC 32.1 30.0 - 36.0 g/dL   RDW 12.9 11.5 - 15.5 %   Platelets 200 150 - 400 K/uL  Creatinine, serum     Status: Abnormal   Collection Time: 10/21/17  5:58 PM  Result Value Ref Range   Creatinine, Ser 1.35 (H) 0.61 - 1.24 mg/dL   GFR calc non Af Amer 52 (L) >60 mL/min   GFR calc Af Amer >60 >60 mL/min  Troponin I     Status: None   Collection Time: 10/21/17  5:58 PM  Result Value Ref Range   Troponin I <0.03 <0.03 ng/mL  Glucose, capillary     Status: Abnormal   Collection Time: 10/21/17  9:39 PM  Result Value Ref Range   Glucose-Capillary 111 (H) 70 - 99 mg/dL   Comment 1 Notify RN    Comment 2 Document in Chart   Troponin I     Status: None   Collection Time: 10/21/17 11:20 PM  Result Value Ref Range   Troponin I <0.03 <0.03 ng/mL  Hemoglobin A1c     Status: Abnormal   Collection Time: 10/22/17  4:45 AM  Result Value Ref Range   Hgb A1c MFr Bld 6.2 (H) 4.8 - 5.6 %   Mean Plasma Glucose 131.24 mg/dL  Lipid panel     Status: Abnormal   Collection Time: 10/22/17  4:45 AM  Result Value Ref Range   Cholesterol 140 0 - 200 mg/dL   Triglycerides 122 <150 mg/dL   HDL 34 (L) >40 mg/dL   Total CHOL/HDL Ratio 4.1 RATIO   VLDL 24 0 - 40 mg/dL   LDL Cholesterol 82 0 - 99 mg/dL  Troponin I     Status: None   Collection Time: 10/22/17  4:45 AM  Result Value Ref Range   Troponin I <0.03 <0.03 ng/mL  Glucose, capillary     Status: Abnormal   Collection Time: 10/22/17  6:23 AM  Result Value Ref Range   Glucose-Capillary 50 (L) 70 - 99 mg/dL   Comment 1 Notify RN     Comment 2 Document in Chart   Glucose, capillary     Status: Abnormal   Collection Time: 10/22/17  6:47 AM  Result Value Ref Range   Glucose-Capillary 59 (L) 70 - 99 mg/dL  Glucose, capillary     Status: Abnormal   Collection Time: 10/22/17  7:06 AM  Result Value Ref Range  Glucose-Capillary 59 (L) 70 - 99 mg/dL  Glucose, capillary     Status: Abnormal   Collection Time: 10/22/17  7:35 AM  Result Value Ref Range   Glucose-Capillary 207 (H) 70 - 99 mg/dL  Glucose, capillary     Status: Abnormal   Collection Time: 10/22/17 11:30 AM  Result Value Ref Range   Glucose-Capillary 112 (H) 70 - 99 mg/dL   Ct Angio Head W Or Wo Contrast  Result Date: 10/21/2017 CLINICAL DATA:  Focal neuro deficit greater than 6 hours. Suspect stroke. EXAM: CT ANGIOGRAPHY HEAD AND NECK TECHNIQUE: Multidetector CT imaging of the head and neck was performed using the standard protocol during bolus administration of intravenous contrast. Multiplanar CT image reconstructions and MIPs were obtained to evaluate the vascular anatomy. Carotid stenosis measurements (when applicable) are obtained utilizing NASCET criteria, using the distal internal carotid diameter as the denominator. CONTRAST:  35mL ISOVUE-370 IOPAMIDOL (ISOVUE-370) INJECTION 76% COMPARISON:  MRI head 12/07/2013 FINDINGS: CT HEAD FINDINGS Brain: Moderate atrophy. Moderate chronic microvascular ischemic changes in the white matter. Small chronic infarct left occipital lobe unchanged. Negative for acute infarct, hemorrhage, or mass lesion. No midline shift. Vascular: Negative for hyperdense vessel Skull: Negative Sinuses: Paranasal sinuses clear bilaterally. Orbits:  Bilateral cataract surgery. Review of the MIP images confirms the above findings CTA NECK FINDINGS Aortic arch: Suboptimal arterial opacification due to early timing of the scan. Mild atherosclerotic disease in the aortic arch. Proximal great vessels widely patent Right carotid system: Right common  carotid artery widely patent. Mild atherosclerotic disease right carotid bifurcation without significant stenosis. Left carotid system: Left common carotid artery widely patent. Mild atherosclerotic disease left carotid bifurcation without significant stenosis Vertebral arteries: Both vertebral arteries appear patent to the basilar. Proximal vertebral arteries not well visualized due to suboptimal opacification and motion. Skeleton: Moderate cervical spondylosis. Poor dentition with numerous caries and periapical lucency especially left upper molar Other neck: Negative for mass or adenopathy Upper chest: Negative Review of the MIP images confirms the above findings CTA HEAD FINDINGS Anterior circulation: Atherosclerotic calcification and mild stenosis of the cavernous carotid bilaterally. Severe stenosis of the temporal branch M2 bilaterally. Mild atherosclerotic disease in the M1 segment bilaterally with mild irregularity and no significant stenosis. Multiple areas of stenosis in M3 branches bilaterally. Posterior circulation: Both vertebral arteries patent to the basilar without stenosis. Basilar mildly irregular without stenosis. Left PICA patent. Right PICA not visualized. AICA patent bilaterally. Superior cerebellar and posterior cerebral arteries patent bilaterally. Diffuse atherosclerotic disease and mild to moderate stenosis in the posterior cerebral artery bilaterally. Venous sinuses: Lack of venous enhancement due to timing Anatomic variants: None Delayed phase: Not performed Review of the MIP images confirms the above findings IMPRESSION: Suboptimal CTA technique due to early scanning and suboptimal arterial opacification Mild atherosclerotic disease in the carotid bifurcation bilaterally. No significant carotid or vertebral artery stenosis in the neck. Mild atherosclerotic stenosis in the cavernous carotid bilaterally. Diffuse intracranial atherosclerotic disease with extensive MCA and PCA disease  bilaterally. No emergent large vessel occlusion. Atrophy and moderate chronic microvascular ischemia. No acute intracranial abnormality. Electronically Signed   By: Franchot Gallo M.D.   On: 10/21/2017 10:13   Dg Chest 2 View  Result Date: 10/21/2017 CLINICAL DATA:  Patient with vomiting.  Headache.  Weakness. EXAM: CHEST - 2 VIEW COMPARISON:  None. FINDINGS: Monitoring leads overlie the patient. Normal cardiac and mediastinal contours. Low lung volumes. Bibasilar heterogeneous pulmonary opacities. No pleural effusion or pneumothorax. Thoracic spine degenerative changes. Possible  nodular opacity left upper lung. IMPRESSION: Low lung volumes with bibasilar opacities favored to represent atelectasis. Infection not excluded. Possible nodular opacity left upper lung. Recommend further evaluation with chest CT. Electronically Signed   By: Lovey Newcomer M.D.   On: 10/21/2017 18:17   Ct Angio Neck W And/or Wo Contrast  Result Date: 10/21/2017 CLINICAL DATA:  Focal neuro deficit greater than 6 hours. Suspect stroke. EXAM: CT ANGIOGRAPHY HEAD AND NECK TECHNIQUE: Multidetector CT imaging of the head and neck was performed using the standard protocol during bolus administration of intravenous contrast. Multiplanar CT image reconstructions and MIPs were obtained to evaluate the vascular anatomy. Carotid stenosis measurements (when applicable) are obtained utilizing NASCET criteria, using the distal internal carotid diameter as the denominator. CONTRAST:  59mL ISOVUE-370 IOPAMIDOL (ISOVUE-370) INJECTION 76% COMPARISON:  MRI head 12/07/2013 FINDINGS: CT HEAD FINDINGS Brain: Moderate atrophy. Moderate chronic microvascular ischemic changes in the white matter. Small chronic infarct left occipital lobe unchanged. Negative for acute infarct, hemorrhage, or mass lesion. No midline shift. Vascular: Negative for hyperdense vessel Skull: Negative Sinuses: Paranasal sinuses clear bilaterally. Orbits:  Bilateral cataract surgery.  Review of the MIP images confirms the above findings CTA NECK FINDINGS Aortic arch: Suboptimal arterial opacification due to early timing of the scan. Mild atherosclerotic disease in the aortic arch. Proximal great vessels widely patent Right carotid system: Right common carotid artery widely patent. Mild atherosclerotic disease right carotid bifurcation without significant stenosis. Left carotid system: Left common carotid artery widely patent. Mild atherosclerotic disease left carotid bifurcation without significant stenosis Vertebral arteries: Both vertebral arteries appear patent to the basilar. Proximal vertebral arteries not well visualized due to suboptimal opacification and motion. Skeleton: Moderate cervical spondylosis. Poor dentition with numerous caries and periapical lucency especially left upper molar Other neck: Negative for mass or adenopathy Upper chest: Negative Review of the MIP images confirms the above findings CTA HEAD FINDINGS Anterior circulation: Atherosclerotic calcification and mild stenosis of the cavernous carotid bilaterally. Severe stenosis of the temporal branch M2 bilaterally. Mild atherosclerotic disease in the M1 segment bilaterally with mild irregularity and no significant stenosis. Multiple areas of stenosis in M3 branches bilaterally. Posterior circulation: Both vertebral arteries patent to the basilar without stenosis. Basilar mildly irregular without stenosis. Left PICA patent. Right PICA not visualized. AICA patent bilaterally. Superior cerebellar and posterior cerebral arteries patent bilaterally. Diffuse atherosclerotic disease and mild to moderate stenosis in the posterior cerebral artery bilaterally. Venous sinuses: Lack of venous enhancement due to timing Anatomic variants: None Delayed phase: Not performed Review of the MIP images confirms the above findings IMPRESSION: Suboptimal CTA technique due to early scanning and suboptimal arterial opacification Mild  atherosclerotic disease in the carotid bifurcation bilaterally. No significant carotid or vertebral artery stenosis in the neck. Mild atherosclerotic stenosis in the cavernous carotid bilaterally. Diffuse intracranial atherosclerotic disease with extensive MCA and PCA disease bilaterally. No emergent large vessel occlusion. Atrophy and moderate chronic microvascular ischemia. No acute intracranial abnormality. Electronically Signed   By: Franchot Gallo M.D.   On: 10/21/2017 10:13   Mr Brain Wo Contrast  Result Date: 10/21/2017 CLINICAL DATA:  Persistent stroke symptoms. Follow-up with thins through the brainstem EXAM: MRI HEAD WITHOUT CONTRAST TECHNIQUE: Multiplanar, multiecho pulse sequences of the brain and surrounding structures were obtained without intravenous contrast. COMPARISON:  Brain MRI earlier today FINDINGS: Diffusion only imaging shows 5 mm acute infarct along the deep surface of the left brachium pontis. Susceptibility artifact from remote microhemorrhages that is far underestimated relative to previous  SW[facility] imaging. IMPRESSION: Diffusion only scan shows a 5 mm acute infarct along the deep surface of the left brachium pontis. Electronically Signed   By: Monte Fantasia M.D.   On: 10/21/2017 16:34   Mr Brain Wo Contrast (neuro Protocol)  Result Date: 10/21/2017 CLINICAL DATA:  Focal neuro deficit greater than 6 hours, suspect stroke. History of diabetes, hyperlipidemia, hypertension, stroke. EXAM: MRI HEAD WITHOUT CONTRAST TECHNIQUE: Multiplanar, multiecho pulse sequences of the brain and surrounding structures were obtained without intravenous contrast. COMPARISON:  CT head 10/21/2017, MRI head 12/07/2013 FINDINGS: Brain: Negative for acute infarct. Moderate atrophy and moderate chronic microvascular ischemia. Widespread chronic microhemorrhage throughout the brain including the cerebellum, brainstem, and cerebral hemispheres bilaterally. This is extensive and has progressed since 2015. No lobar  hemorrhage. 8 x 10 mm mass just below the tentorium laterally on the left is unchanged most compatible with meningioma. This is mildly calcified on CT. Vascular: Normal arterial flow void Skull and upper cervical spine: Negative Sinuses/Orbits: Paranasal sinuses clear. Bilateral cataract surgery. Other: None IMPRESSION: 1. Negative for acute infarct 2. Moderate atrophy and moderate chronic microvascular ischemia 3. Extensive and widespread microcalcifications throughout the cerebellum, brainstem, and both cerebral hemispheres, with progression since 2015. Favor poorly controlled hypertension although cerebral amyloid is also possible. 4. 8 x 10 mm meningioma left lateral tentorium. Unchanged from 2015. Electronically Signed   By: Franchot Gallo M.D.   On: 10/21/2017 11:54    Assessment/Plan: Diagnosis: left brachium pontis infarct.   Labs and images (see above) independently reviewed.  Records reviewed and summated above. Stroke: Continue secondary stroke prophylaxis and Risk Factor Modification listed below:   Antiplatelet therapy:   Blood Pressure Management:  Continue current medication with prn's with permisive HTN per primary team Statin Agent:   Diabetes management:   Right sided hemiparesis: fit for orthosis to prevent contractures (resting hand splint for day, wrist cock up splint at night, PRAFO, etc) Motor recovery: Fluoxetine  1. Does the need for close, 24 hr/day medical supervision in concert with the patient's rehab needs make it unreasonable for this patient to be served in a less intensive setting?   2. Co-Morbidities requiring supervision/potential complications: T2 DM (Monitor in accordance with exercise and adjust meds as necessary), HTN (monitor and provide prns in accordance with increased physical exertion and pain), glaucoma, prior left mid brain stroke, ABLA (transfuse if necessary to ensure appropriate perfusion for increased activity tolerance), CKD (avoid nephrotoxic  meds) 3. Due to safety, disease management and patient education, does the patient require 24 hr/day rehab nursing? Yes 4. Does the patient require coordinated care of a physician, rehab nurse, PT (1-2 hrs/day, 5 days/week), OT (1-2 hrs/day, 5 days/week) and SLP (1-2 hrs/day, 5 days/week) to address physical and functional deficits in the context of the above medical diagnosis(es)? Yes Addressing deficits in the following areas: balance, endurance, locomotion, strength, transferring, bathing, dressing, toileting, cognition and psychosocial support 5. Can the patient actively participate in an intensive therapy program of at least 3 hrs of therapy per day at least 5 days per week? Yes 6. The potential for patient to make measurable gains while on inpatient rehab is excellent 7. Anticipated functional outcomes upon discharge from inpatient rehab are supervision and min assist  with PT, supervision and min assist with OT, modified independent with SLP. 8. Estimated rehab length of stay to reach the above functional goals is: 14-18 days. 9. Anticipated D/C setting: Home 10. Anticipated post D/C treatments: HH therapy and Home excercise program  11. Overall Rehab/Functional Prognosis: good  RECOMMENDATIONS: This patient's condition is appropriate for continued rehabilitative care in the following setting: CIR Patient has agreed to participate in recommended program. Yes Note that insurance prior authorization may be required for reimbursement for recommended care.  Comment: Rehab Admissions Coordinator to follow up.   I have personally performed a face to face diagnostic evaluation, including, but not limited to relevant history and physical exam findings, of this patient and developed relevant assessment and plan.  Additionally, I have reviewed and concur with the physician assistant's documentation above.   Delice Lesch, MD, ABPMR Bary Leriche, PA-C 10/22/2017

## 2017-10-22 NOTE — Progress Notes (Addendum)
STROKE TEAM PROGRESS NOTE  HPI:( Dr Leonel Ramsay )  James Henderson is a 67 y.o. male with history of stroke, hypertension, hyperlipidemia, glaucoma, diabetes.  Patient had a previous acute midbrain affecting the left peri-aqueduct back in 2015.  At that time both patient and wife stated that he did have difficulty with his gait very much like this however symptoms of nausea vomiting or not present.  Apparently yesterday patient was watching TV most the day but would get up multiple times to go to the bathroom and he has been doing this for the last couple days.  However yesterday in the afternoon he noted that he is very off-balance and would stagger from side to side to get to the bathroom.  He did not fall.  This got worse over the evening and this morning it got no better thus patient came in to the hospital and fear he may be having another stroke.  Patient did not drink alcohol yesterday per wife and also does not take an aspirin.  Per wife yesterday he was having multiple bouts of emesis in addition as stated above multiple bouts of urination.  He even had multiple bouts of emesis this morning but does not have any today. He denies any vertigo, double vision, numbness tingling throughout the body other than his peripheral neuropathy which is very mild.  He denies any oscillopsia.  He denies any tinnitus or abnormal rushing sounds in his ears. LKW: 10/20/2017 at approximately 12:00 to 1300 hrs. tpa given?: no, out of window Premorbid modified Rankin scale (mRS): 0 NIH stroke score of 1 due to right facial droop likely from previous stroke  INTERVAL HISTORY His family is at the bedside.  Neuro stable.  Vitals:   10/21/17 1855 10/21/17 2013 10/22/17 0008 10/22/17 0455  BP: (!) 184/86 (!) 151/73 127/69 130/71  Pulse: 66 61 (!) 56 (!) 56  Resp: 18 20 18 18   Temp: 98.1 F (36.7 C) 98.4 F (36.9 C) 98.9 F (37.2 C) 98.8 F (37.1 C)  TempSrc: Oral Oral Oral Oral  SpO2: 96% 97% 99% 98%    CBC:   Recent Labs  Lab 10/21/17 0717 10/21/17 1758  WBC 7.4 8.3  NEUTROABS 6.0  --   HGB 13.5 12.9*  HCT 42.0 40.2  MCV 96.3 96.2  PLT 227 841    Basic Metabolic Panel:  Recent Labs  Lab 10/21/17 0717 10/21/17 1758  NA 144  --   K 4.1  --   CL 111  --   CO2 25  --   GLUCOSE 206*  --   BUN 13  --   CREATININE 1.45* 1.35*  CALCIUM 9.3  --    Lipid Panel:     Component Value Date/Time   CHOL 140 10/22/2017 0445   TRIG 122 10/22/2017 0445   HDL 34 (L) 10/22/2017 0445   CHOLHDL 4.1 10/22/2017 0445   VLDL 24 10/22/2017 0445   LDLCALC 82 10/22/2017 0445   HgbA1c:  Lab Results  Component Value Date   HGBA1C 6.2 (H) 10/22/2017   Urine Drug Screen:     Component Value Date/Time   LABOPIA NONE DETECTED 10/21/2017 1036   COCAINSCRNUR NONE DETECTED 10/21/2017 1036   LABBENZ NONE DETECTED 10/21/2017 1036   AMPHETMU NONE DETECTED 10/21/2017 1036   THCU NONE DETECTED 10/21/2017 1036   LABBARB NONE DETECTED 10/21/2017 1036    Alcohol Level     Component Value Date/Time   ETH <10 10/21/2017 0717    IMAGING Ct  Angio Head W Or Wo Contrast  Result Date: 10/21/2017 CLINICAL DATA:  Focal neuro deficit greater than 6 hours. Suspect stroke. EXAM: CT ANGIOGRAPHY HEAD AND NECK TECHNIQUE: Multidetector CT imaging of the head and neck was performed using the standard protocol during bolus administration of intravenous contrast. Multiplanar CT image reconstructions and MIPs were obtained to evaluate the vascular anatomy. Carotid stenosis measurements (when applicable) are obtained utilizing NASCET criteria, using the distal internal carotid diameter as the denominator. CONTRAST:  60mL ISOVUE-370 IOPAMIDOL (ISOVUE-370) INJECTION 76% COMPARISON:  MRI head 12/07/2013 FINDINGS: CT HEAD FINDINGS Brain: Moderate atrophy. Moderate chronic microvascular ischemic changes in the white matter. Small chronic infarct left occipital lobe unchanged. Negative for acute infarct, hemorrhage, or mass lesion.  No midline shift. Vascular: Negative for hyperdense vessel Skull: Negative Sinuses: Paranasal sinuses clear bilaterally. Orbits:  Bilateral cataract surgery. Review of the MIP images confirms the above findings CTA NECK FINDINGS Aortic arch: Suboptimal arterial opacification due to early timing of the scan. Mild atherosclerotic disease in the aortic arch. Proximal great vessels widely patent Right carotid system: Right common carotid artery widely patent. Mild atherosclerotic disease right carotid bifurcation without significant stenosis. Left carotid system: Left common carotid artery widely patent. Mild atherosclerotic disease left carotid bifurcation without significant stenosis Vertebral arteries: Both vertebral arteries appear patent to the basilar. Proximal vertebral arteries not well visualized due to suboptimal opacification and motion. Skeleton: Moderate cervical spondylosis. Poor dentition with numerous caries and periapical lucency especially left upper molar Other neck: Negative for mass or adenopathy Upper chest: Negative Review of the MIP images confirms the above findings CTA HEAD FINDINGS Anterior circulation: Atherosclerotic calcification and mild stenosis of the cavernous carotid bilaterally. Severe stenosis of the temporal branch M2 bilaterally. Mild atherosclerotic disease in the M1 segment bilaterally with mild irregularity and no significant stenosis. Multiple areas of stenosis in M3 branches bilaterally. Posterior circulation: Both vertebral arteries patent to the basilar without stenosis. Basilar mildly irregular without stenosis. Left PICA patent. Right PICA not visualized. AICA patent bilaterally. Superior cerebellar and posterior cerebral arteries patent bilaterally. Diffuse atherosclerotic disease and mild to moderate stenosis in the posterior cerebral artery bilaterally. Venous sinuses: Lack of venous enhancement due to timing Anatomic variants: None Delayed phase: Not performed Review  of the MIP images confirms the above findings IMPRESSION: Suboptimal CTA technique due to early scanning and suboptimal arterial opacification Mild atherosclerotic disease in the carotid bifurcation bilaterally. No significant carotid or vertebral artery stenosis in the neck. Mild atherosclerotic stenosis in the cavernous carotid bilaterally. Diffuse intracranial atherosclerotic disease with extensive MCA and PCA disease bilaterally. No emergent large vessel occlusion. Atrophy and moderate chronic microvascular ischemia. No acute intracranial abnormality. Electronically Signed   By: Franchot Gallo M.D.   On: 10/21/2017 10:13   Dg Chest 2 View  Result Date: 10/21/2017 CLINICAL DATA:  Patient with vomiting.  Headache.  Weakness. EXAM: CHEST - 2 VIEW COMPARISON:  None. FINDINGS: Monitoring leads overlie the patient. Normal cardiac and mediastinal contours. Low lung volumes. Bibasilar heterogeneous pulmonary opacities. No pleural effusion or pneumothorax. Thoracic spine degenerative changes. Possible nodular opacity left upper lung. IMPRESSION: Low lung volumes with bibasilar opacities favored to represent atelectasis. Infection not excluded. Possible nodular opacity left upper lung. Recommend further evaluation with chest CT. Electronically Signed   By: Lovey Newcomer M.D.   On: 10/21/2017 18:17   Ct Angio Neck W And/or Wo Contrast  Result Date: 10/21/2017 CLINICAL DATA:  Focal neuro deficit greater than 6 hours. Suspect stroke. EXAM:  CT ANGIOGRAPHY HEAD AND NECK TECHNIQUE: Multidetector CT imaging of the head and neck was performed using the standard protocol during bolus administration of intravenous contrast. Multiplanar CT image reconstructions and MIPs were obtained to evaluate the vascular anatomy. Carotid stenosis measurements (when applicable) are obtained utilizing NASCET criteria, using the distal internal carotid diameter as the denominator. CONTRAST:  59mL ISOVUE-370 IOPAMIDOL (ISOVUE-370) INJECTION 76%  COMPARISON:  MRI head 12/07/2013 FINDINGS: CT HEAD FINDINGS Brain: Moderate atrophy. Moderate chronic microvascular ischemic changes in the white matter. Small chronic infarct left occipital lobe unchanged. Negative for acute infarct, hemorrhage, or mass lesion. No midline shift. Vascular: Negative for hyperdense vessel Skull: Negative Sinuses: Paranasal sinuses clear bilaterally. Orbits:  Bilateral cataract surgery. Review of the MIP images confirms the above findings CTA NECK FINDINGS Aortic arch: Suboptimal arterial opacification due to early timing of the scan. Mild atherosclerotic disease in the aortic arch. Proximal great vessels widely patent Right carotid system: Right common carotid artery widely patent. Mild atherosclerotic disease right carotid bifurcation without significant stenosis. Left carotid system: Left common carotid artery widely patent. Mild atherosclerotic disease left carotid bifurcation without significant stenosis Vertebral arteries: Both vertebral arteries appear patent to the basilar. Proximal vertebral arteries not well visualized due to suboptimal opacification and motion. Skeleton: Moderate cervical spondylosis. Poor dentition with numerous caries and periapical lucency especially left upper molar Other neck: Negative for mass or adenopathy Upper chest: Negative Review of the MIP images confirms the above findings CTA HEAD FINDINGS Anterior circulation: Atherosclerotic calcification and mild stenosis of the cavernous carotid bilaterally. Severe stenosis of the temporal branch M2 bilaterally. Mild atherosclerotic disease in the M1 segment bilaterally with mild irregularity and no significant stenosis. Multiple areas of stenosis in M3 branches bilaterally. Posterior circulation: Both vertebral arteries patent to the basilar without stenosis. Basilar mildly irregular without stenosis. Left PICA patent. Right PICA not visualized. AICA patent bilaterally. Superior cerebellar and posterior  cerebral arteries patent bilaterally. Diffuse atherosclerotic disease and mild to moderate stenosis in the posterior cerebral artery bilaterally. Venous sinuses: Lack of venous enhancement due to timing Anatomic variants: None Delayed phase: Not performed Review of the MIP images confirms the above findings IMPRESSION: Suboptimal CTA technique due to early scanning and suboptimal arterial opacification Mild atherosclerotic disease in the carotid bifurcation bilaterally. No significant carotid or vertebral artery stenosis in the neck. Mild atherosclerotic stenosis in the cavernous carotid bilaterally. Diffuse intracranial atherosclerotic disease with extensive MCA and PCA disease bilaterally. No emergent large vessel occlusion. Atrophy and moderate chronic microvascular ischemia. No acute intracranial abnormality. Electronically Signed   By: Franchot Gallo M.D.   On: 10/21/2017 10:13   Mr Brain Wo Contrast  Result Date: 10/21/2017 CLINICAL DATA:  Persistent stroke symptoms. Follow-up with thins through the brainstem EXAM: MRI HEAD WITHOUT CONTRAST TECHNIQUE: Multiplanar, multiecho pulse sequences of the brain and surrounding structures were obtained without intravenous contrast. COMPARISON:  Brain MRI earlier today FINDINGS: Diffusion only imaging shows 5 mm acute infarct along the deep surface of the left brachium pontis. Susceptibility artifact from remote microhemorrhages that is far underestimated relative to previous SWI imaging. IMPRESSION: Diffusion only scan shows a 5 mm acute infarct along the deep surface of the left brachium pontis. Electronically Signed   By: Monte Fantasia M.D.   On: 10/21/2017 16:34   Mr Brain Wo Contrast (neuro Protocol)  Result Date: 10/21/2017 CLINICAL DATA:  Focal neuro deficit greater than 6 hours, suspect stroke. History of diabetes, hyperlipidemia, hypertension, stroke. EXAM: MRI HEAD WITHOUT CONTRAST TECHNIQUE:  Multiplanar, multiecho pulse sequences of the brain and  surrounding structures were obtained without intravenous contrast. COMPARISON:  CT head 10/21/2017, MRI head 12/07/2013 FINDINGS: Brain: Negative for acute infarct. Moderate atrophy and moderate chronic microvascular ischemia. Widespread chronic microhemorrhage throughout the brain including the cerebellum, brainstem, and cerebral hemispheres bilaterally. This is extensive and has progressed since 2015. No lobar hemorrhage. 8 x 10 mm mass just below the tentorium laterally on the left is unchanged most compatible with meningioma. This is mildly calcified on CT. Vascular: Normal arterial flow void Skull and upper cervical spine: Negative Sinuses/Orbits: Paranasal sinuses clear. Bilateral cataract surgery. Other: None IMPRESSION: 1. Negative for acute infarct 2. Moderate atrophy and moderate chronic microvascular ischemia 3. Extensive and widespread microcalcifications throughout the cerebellum, brainstem, and both cerebral hemispheres, with progression since 2015. Favor poorly controlled hypertension although cerebral amyloid is also possible. 4. 8 x 10 mm meningioma left lateral tentorium. Unchanged from 2015. Electronically Signed   By: Franchot Gallo M.D.   On: 10/21/2017 11:54    PHYSICAL EXAM Pleasant middle-age African-American male not in distress. . Afebrile. Head is nontraumatic. Neck is supple without bruit.    Cardiac exam no murmur or gallop. Lungs are clear to auscultation. Distal pulses are well felt. Neurological Exam ;  Awake  Alert oriented x 3. Normal speech and language.eye movements full without nystagmus.fundi were not visualized. Vision acuity and fields appear normal. Hearing is normal. Palatal movements are normal. Face asymmetric but over right lower facial weakness. Tongue midline. Normal strength, tone, reflexes and coordination. Diminished touch and pinprick sensation from mid calf down.  . Gait deferred.  ASSESSMENT/PLAN Mr. Price Lachapelle is a 67 y.o. [place] male with history  of previous stroke, HTN, HLD, DB, glaucoma presenting with difficulty walking, nausea and vomiting x1 day, headache and generalized weakness.   Stroke:   Left brachium pontis infarct secondary to small vessel disease source  CTA head & neck mild atherosclerotic disease bilateral carotids.  Diffuse intracranial atherosclerotic disease.  No ELVO. Small vessel disease. Atrophy.   MRI no acute infarct.  Small vessel disease.  Moderate atrophy.  Extensive micro-calcifications consistent with uncontrolled hypertension, cannot rule out amyloid.  Left lateral tentorium meningioma 8 x 10 mm (stable since 2015)  Repeat MRI left brachium pontis 5 mm acute infarct  2D Echo pending  LDL 82  HgbA1c 6.2  Lovenox 40 mg sq daily for VTE prophylaxis  aspirin 81 mg daily prior to admission, now on aspirin 325 mg daily and clopidogrel 75 mg daily. Will change to plavix alone (not DAPT as typical) d.t microhemorrhages. Continue at d/c.  Therapy recommendations:  CIR. consult placed  Disposition:  pending   Hypertension  Stable . Permissive hypertension (OK if < 220/120) but gradually normalize in 5-7 days . Long-term BP goal normotensive  Hyperlipidemia  Home meds: Mevacor 40 and fish oil  On Crestor 40 mg in hospital  LDL 82, goal < 70  Continue statin at discharge  Diabetes type II  HgbA1c 6.2, goal < 7.0  Controlled  Other Stroke Risk Factors  Advanced age  Former cigarette smoker  Hx stroke/TIA  11/2013 - Non-dominant small midbrain infarct secondary to small vessel disease source. Placed on 81 mg aspirin daily at that time due to chronic microhemorrhages.  Family hx stroke (mother and father)  Hospital day # [place], MSN, APRN, ANVP-BC, AGPCNP-BC Advanced Practice Stroke Nurse [place] for Schedule & Pager information 10/22/2017 2:07 PM  I have personally examined this patient, reviewed notes, independently viewed imaging studies,  participated in medical decision making and plan of care.ROS completed by me personally and pertinent positives fully documented  I have made any additions or clarifications directly to the above note. Agree with note above. He has presented with dizziness and gait ataxia due to small cerebellar infarct etiology unclear but he is had previous lacunar infarcts and he is not a good long-term anticoagulation candidate given multiple microhemorrhages on his MRI hence we will not pursue further workup for source of embolism. Recommend aspirin alone for stroke prevention. Continue ongoing stroke workup. Long discussion the patient and wife and answered questions. Greater than 50% time during this 35 minute visit was spent on counseling and coordination of care about his stroke and answered questions  Antony Contras, MD Medical Director [place] Pager: [phone] 10/22/2017 2:45 PM  To contact Stroke Continuity provider, please refer to [email or web address] After hours, contact General Neurology

## 2017-10-23 LAB — BASIC METABOLIC PANEL
Anion gap: 5 (ref 5–15)
BUN: 12 mg/dL (ref 8–23)
CO2: 24 mmol/L (ref 22–32)
Calcium: 8.7 mg/dL — ABNORMAL LOW (ref 8.9–10.3)
Chloride: 115 mmol/L — ABNORMAL HIGH (ref 98–111)
Creatinine, Ser: 1.3 mg/dL — ABNORMAL HIGH (ref 0.61–1.24)
GFR calc Af Amer: >60 mL/min (ref >60)
GFR calc non Af Amer: 54 mL/min — ABNORMAL LOW (ref >60)
Glucose, Bld: 112 mg/dL — ABNORMAL HIGH (ref 70–99)
Potassium: 3.8 mmol/L (ref 3.5–5.1)
Sodium: 144 mmol/L (ref 135–145)

## 2017-10-23 LAB — GLUCOSE, CAPILLARY
Glucose-Capillary: 110 mg/dL — ABNORMAL HIGH (ref 70–99)
Glucose-Capillary: 153 mg/dL — ABNORMAL HIGH (ref 70–99)
Glucose-Capillary: 108 mg/dL — ABNORMAL HIGH (ref 70–99)
Glucose-Capillary: 152 mg/dL — ABNORMAL HIGH (ref 70–99)

## 2017-10-23 MED ORDER — LOSARTAN POTASSIUM 50 MG PO TABS
100.0000 mg | ORAL_TABLET | Freq: Every day | ORAL | Status: DC
  Administered 2017-10-23 – 2017-10-24 (×2): 100 mg via ORAL
  Filled 2017-10-23 (×2): qty 2

## 2017-10-23 NOTE — Progress Notes (Signed)
Inpatient Rehabilitation Admissions Coordinator  I met with patient at bedside to discuss goals and expectations of an inpt rehab admit. He would like to discuss with his wife when she arrives around 1 pm today. I will follow up at that time.  Danne Baxter, RN, MSN Rehab Admissions Coordinator [phone] 10/23/2017 10:42 AM

## 2017-10-23 NOTE — Progress Notes (Signed)
Physical Therapy Treatment Patient Details Name: James Henderson MRN: [id number] DOB: 05-May-1950 Today's Date: 10/23/2017    History of Present Illness 67 y.o. male admitted on 10/21/17 for difficulting ambulating, N/V, HA, generalized weakness.  MRI revealed Left brachium pontis infarct secondary to small vessel disease.  Pt with significant PMH of stroke, HTN, glaucoma, DM, and eye surgery.      PT Comments    Pt is progressing well with gait into the hallway today with two person assist for safety and balance.  We worked in weight shifting to the right in standing and preformed multiple reps of 5 times sit to stand, working on evening out his weight shift during this activity as well.  Pt remains appropriate for intensive post acute rehab as his goal is to return to work and independence.    Follow Up Recommendations  CIR     Equipment Recommendations  Rolling walker with 5" wheels    Recommendations for Other Services Rehab consult     Precautions / Restrictions Precautions Precautions: Fall Precaution Comments: left lateral lean    Mobility  Bed Mobility               General bed mobility comments: Pt was OOB in the recliner chair.   Transfers Overall transfer level: Needs assistance Equipment used: None Transfers: Sit to/from Stand Sit to Stand: Min assist;+2 safety/equipment         General transfer comment: Min assist to steady trunk for balance, at times it took multiple tries to get all the way up to standing.   Ambulation/Gait Ambulation/Gait assistance: Mod assist;+2 physical assistance Gait Distance (Feet): 85 Feet Assistive device: 2 person hand held assist Gait Pattern/deviations: Step-through pattern;Staggering left;Drifts right/left;Narrow base of support   Gait velocity interpretation: 1.31 - 2.62 ft/sec, indicative of limited community ambulator General Gait Details: Pt with narrow base, L leg adducted , leaning trunk to the left with cues to  widen base and lean toward the person on the right side of him.         Modified Rankin (Stroke Patients Only) Modified Rankin (Stroke Patients Only) Pre-Morbid Rankin Score: No symptoms Modified Rankin: Moderately severe disability     Balance Overall balance assessment: Needs assistance Sitting-balance support: Feet supported;No upper extremity supported Sitting balance-Leahy Scale: Fair Sitting balance - Comments: supervision, however still with L lateral lean in sitting Postural control: Left lateral lean Standing balance support: Bilateral upper extremity supported Standing balance-Leahy Scale: Poor Standing balance comment: min assist in static standing today, we worked at [employer] on shifting weight to the right and back to midline and then shifting weight forward on the balls of his feet instead of tipping back on his heels.                             Cognition Arousal/Alertness: Awake/alert Behavior During Therapy: WFL for tasks assessed/performed Overall Cognitive Status: Impaired/Different from baseline Area of Impairment: Awareness;Safety/judgement                   Current Attention Level: Selective     Safety/Judgement: Decreased awareness of safety;Decreased awareness of deficits Awareness: Emergent Problem Solving: Requires verbal cues General Comments: Pt becoming more aware of his deficits as he is allowed to get up and move around more with assistance.       Exercises Other Exercises Other Exercises: 5 times sit to stand preformed twice with best time of 27.08 (  11.4 seconds is normal for his age).         Pertinent Vitals/Pain Pain Assessment: No/denies pain    Home Living     Available Help at Discharge: Family;Available 24 hours/day Type of Home: House                  PT Goals (current goals can now be found in the care plan section) Acute Rehab PT Goals Patient Stated Goal: to retrun to work Progress towards PT  goals: Progressing toward goals    Frequency    Min 4X/week      PT Plan Current plan remains appropriate       AM-PAC PT "6 Clicks" Daily Activity  Outcome Measure  Difficulty turning over in bed (including adjusting bedclothes, sheets and blankets)?: A Little Difficulty moving from lying on back to sitting on the side of the bed? : A Little Difficulty sitting down on and standing up from a chair with arms (e.g., wheelchair, bedside commode, etc,.)?: Unable Help needed moving to and from a bed to chair (including a wheelchair)?: A Little Help needed walking in hospital room?: A Lot Help needed climbing 3-5 steps with a railing? : A Lot 6 Click Score: 14    End of Session Equipment Utilized During Treatment: Gait belt Activity Tolerance: Patient tolerated treatment well Patient left: in chair;with call bell/phone within reach;with chair alarm set   PT Visit Diagnosis: Muscle weakness (generalized) (M62.81);Ataxic gait (R26.0);Difficulty in walking, not elsewhere classified (R26.2);Hemiplegia and hemiparesis Hemiplegia - Right/Left: Left Hemiplegia - dominant/non-dominant: Non-dominant Hemiplegia - caused by: Cerebral infarction     Time: 8676-7209 PT Time Calculation (min) (ACUTE ONLY): 20 min  Charges:  $Gait Training: 8-22 mins          James Henderson B. Jerimah Witucki, PT, DPT  Acute Rehabilitation [phone] pager #[phone] office             10/23/2017, 11:15 AM

## 2017-10-23 NOTE — Progress Notes (Signed)
PROGRESS NOTE    James Henderson  JGG:[id number] DOB: 25-Apr-1950 DOA: 10/21/2017 PCP: Lois Huxley, PA    Brief Narrative:  67 year old man with past medical history relevant for type 2 diabetes on oral hypoglycemics, hypertension, stage III CKD, hyperlipidemia, prior CVA in 2015 admitted with difficulty ambulating, nausea, vomiting, diarrhea found to have cerebellar stroke due to small vessel disease likely.   Assessment & Plan:   Principal Problem:   Acute CVA (cerebrovascular accident) ([place]) Active Problems:   Essential hypertension   Dizziness   DM (diabetes mellitus) type II controlled, neurological manifestation (HCC)   HLD (hyperlipidemia)   Diabetes mellitus type 2 in nonobese (HCC)   Benign essential HTN   History of CVA (cerebrovascular accident)   Acute blood loss anemia   Stage 3 chronic kidney disease (HCC)   #) CVA: Recurrent. -Neurology following, appreciate recommendations -Continue  clopidogrel 75 mg, full dose aspirin was discontinued by neurology due to multiple microhemorrhages in the brain - Continue rosuvastatin 40 mg nightly -PT recommends [facility] inpatient rehab, pending discussion with wife  #) Hypertension/hyperlipidemia: -Continue statin - Restart home losartan 100 mg daily  #) Type 2 diabetes: - Sliding scale insulin, AC at bedtime - Hold home glipizide 5 mg daily -Hold home metformin 100 mg nightly  #) Stage III CKD: Creatinine stable -Start home ARB  Fluids: Tolerating p.o. Electrolyte: Monitor and supplement Nutrition: Carb restricted diet  Prophylaxis: Enoxaparin  Disposition: Pending discussion with Coumadin patient rehab  Full code   Consultants:   Neurology  Inpatient rehab  Procedures:  Echo 10/22/2017: - Left ventricle: The cavity size was normal. Systolic function was   normal. The estimated ejection fraction was in the range of 55%   to 60%. Wall motion was normal; there were no regional wall   motion abnormalities.  The study is indeterminate for the   evaluation of LV diastolic function. - Aortic valve: There was no regurgitation. - Mitral valve: Calcified annulus. There was no significant   regurgitation. - Right ventricle: Systolic function was normal. - Atrial septum: No defect or patent foramen ovale was identified. - Tricuspid valve: There was no significant regurgitation. - Pulmonic valve: There was no significant regurgitation.  Impressions:   - No cardiac source of emboli was indentified.  Antimicrobials:   None   Subjective: Patient reports she is doing fairly well.  He is requesting to work with rehab.  He continues to report gait unsteadiness.  He denies any nausea, vomiting, diarrhea, cough, congestion.  Objective: Vitals:   10/22/17 2008 10/23/17 0001 10/23/17 0526 10/23/17 0800  BP: (!) 145/75 140/65 (!) 154/87 (!) 175/95  Pulse: (!) 59 (!) 57 (!) 54 (!) 53  Resp: 18 18 18 17   Temp: 98.8 F (37.1 C) 99 F (37.2 C) 98.2 F (36.8 C) 98.2 F (36.8 C)  TempSrc: Oral Oral Oral Oral  SpO2: 97% 98% 98% 97%  Weight:   79.5 kg   Height:        Intake/Output Summary (Last 24 hours) at 10/23/2017 1106 Last data filed at 10/23/2017 0915 Gross per 24 hour  Intake 703.69 ml  Output 1725 ml  Net -1021.31 ml   Filed Weights   10/22/17 1823 10/23/17 0526  Weight: 81.9 kg 79.5 kg    Examination:  General exam: Appears calm and comfortable  Respiratory system: Clear to auscultation. Respiratory effort normal. Cardiovascular system: Regular rate and rhythm, no murmurs Gastrointestinal system: Abdomen is nondistended, soft and nontender. No organomegaly or masses felt.  Normal bowel sounds heard. Central nervous system: Alert and oriented.  Very unsteady on feet, ataxic gait, some facial drooping but unclear how old, dysarthric due to edentulous state Extremities: Trace lower extremity edema Skin: No rashes visible skin Psychiatry: Judgement and insight appear normal. Mood &  affect appropriate.     Data Reviewed: I have personally reviewed following labs and imaging studies  CBC: Recent Labs  Lab 10/21/17 0717 10/21/17 1758  WBC 7.4 8.3  NEUTROABS 6.0  --   HGB 13.5 12.9*  HCT 42.0 40.2  MCV 96.3 96.2  PLT 227 151   Basic Metabolic Panel: Recent Labs  Lab 10/21/17 0717 10/21/17 1758 10/23/17 0432  NA 144  --  144  K 4.1  --  3.8  CL 111  --  115*  CO2 25  --  24  GLUCOSE 206*  --  112*  BUN 13  --  12  CREATININE 1.45* 1.35* 1.30*  CALCIUM 9.3  --  8.7*   GFR: Estimated Creatinine Clearance: 52.6 mL/min (A) (by C-G formula based on SCr of 1.3 mg/dL (H)). Liver Function Tests: No results for input(s): AST, ALT, ALKPHOS, BILITOT, PROT, ALBUMIN in the last 168 hours. No results for input(s): LIPASE, AMYLASE in the last 168 hours. No results for input(s): AMMONIA in the last 168 hours. Coagulation Profile: Recent Labs  Lab 10/21/17 0842  INR 1.06   Cardiac Enzymes: Recent Labs  Lab 10/21/17 1758 10/21/17 2320 10/22/17 0445  TROPONINI <0.03 <0.03 <0.03   BNP (last 3 results) No results for input(s): PROBNP in the last 8760 hours. HbA1C: Recent Labs    10/22/17 0445  HGBA1C 6.2*   CBG: Recent Labs  Lab 10/22/17 0735 10/22/17 1130 10/22/17 1644 10/22/17 2113 10/23/17 0626  GLUCAP 207* 112* 128* 131* 110*   Lipid Profile: Recent Labs    10/22/17 0445  CHOL 140  HDL 34*  LDLCALC 82  TRIG 122  CHOLHDL 4.1   Thyroid Function Tests: No results for input(s): TSH, T4TOTAL, FREET4, T3FREE, THYROIDAB in the last 72 hours. Anemia Panel: No results for input(s): VITAMINB12, FOLATE, FERRITIN, TIBC, IRON, RETICCTPCT in the last 72 hours. Sepsis Labs: No results for input(s): PROCALCITON, LATICACIDVEN in the last 168 hours.  No results found for this or any previous visit (from the past 240 hour(s)).       Radiology Studies: Dg Chest 2 View  Result Date: 10/21/2017 CLINICAL DATA:  Patient with vomiting.   Headache.  Weakness. EXAM: CHEST - 2 VIEW COMPARISON:  None. FINDINGS: Monitoring leads overlie the patient. Normal cardiac and mediastinal contours. Low lung volumes. Bibasilar heterogeneous pulmonary opacities. No pleural effusion or pneumothorax. Thoracic spine degenerative changes. Possible nodular opacity left upper lung. IMPRESSION: Low lung volumes with bibasilar opacities favored to represent atelectasis. Infection not excluded. Possible nodular opacity left upper lung. Recommend further evaluation with chest CT. Electronically Signed   By: Lovey Newcomer M.D.   On: 10/21/2017 18:17   Mr Brain Wo Contrast  Result Date: 10/21/2017 CLINICAL DATA:  Persistent stroke symptoms. Follow-up with thins through the brainstem EXAM: MRI HEAD WITHOUT CONTRAST TECHNIQUE: Multiplanar, multiecho pulse sequences of the brain and surrounding structures were obtained without intravenous contrast. COMPARISON:  Brain MRI earlier today FINDINGS: Diffusion only imaging shows 5 mm acute infarct along the deep surface of the left brachium pontis. Susceptibility artifact from remote microhemorrhages that is far underestimated relative to previous SWI imaging. IMPRESSION: Diffusion only scan shows a 5 mm acute infarct along the  deep surface of the left brachium pontis. Electronically Signed   By: Monte Fantasia M.D.   On: 10/21/2017 16:34   Mr Brain Wo Contrast (neuro Protocol)  Result Date: 10/21/2017 CLINICAL DATA:  Focal neuro deficit greater than 6 hours, suspect stroke. History of diabetes, hyperlipidemia, hypertension, stroke. EXAM: MRI HEAD WITHOUT CONTRAST TECHNIQUE: Multiplanar, multiecho pulse sequences of the brain and surrounding structures were obtained without intravenous contrast. COMPARISON:  CT head 10/21/2017, MRI head 12/07/2013 FINDINGS: Brain: Negative for acute infarct. Moderate atrophy and moderate chronic microvascular ischemia. Widespread chronic microhemorrhage throughout the brain including the  cerebellum, brainstem, and cerebral hemispheres bilaterally. This is extensive and has progressed since 2015. No lobar hemorrhage. 8 x 10 mm mass just below the tentorium laterally on the left is unchanged most compatible with meningioma. This is mildly calcified on CT. Vascular: Normal arterial flow void Skull and upper cervical spine: Negative Sinuses/Orbits: Paranasal sinuses clear. Bilateral cataract surgery. Other: None IMPRESSION: 1. Negative for acute infarct 2. Moderate atrophy and moderate chronic microvascular ischemia 3. Extensive and widespread microcalcifications throughout the cerebellum, brainstem, and both cerebral hemispheres, with progression since 2015. Favor poorly controlled hypertension although cerebral amyloid is also possible. 4. 8 x 10 mm meningioma left lateral tentorium. Unchanged from 2015. Electronically Signed   By: Franchot Gallo M.D.   On: 10/21/2017 11:54        Scheduled Meds: .  stroke: mapping our early stages of recovery book   Does not apply Once  . cholecalciferol  5,000 Units Oral Daily  . clopidogrel  75 mg Oral Daily  . enoxaparin (LOVENOX) injection  40 mg Subcutaneous Q24H  . insulin aspart  0-15 Units Subcutaneous TID WC  . rosuvastatin  40 mg Oral q1800  . timolol  1 drop Both Eyes Daily  . Travoprost (BAK Free)  1 drop Both Eyes QHS   Continuous Infusions:   LOS: 2 days    Time spent: [place], MD [facility]  If 7PM-7AM, please contact night-coverage [email or web address] Password [email or web address] 10/23/2017, 11:06 AM

## 2017-10-23 NOTE — Progress Notes (Signed)
Inpatient Rehabilitation Admissions Coordinator  I met with patient and his wife at bedside to discuss the recommendation for an inpt rehab admit. She states she prefers to take him home and have outpatient rehab. She can provide 24/7 care. I have notified RN CM, Kelli as well as contacted Dr. Herbert Moors via [facility] text. I have withdrawn my request with his insurance. We will sign off at this time.  Danne Baxter, RN, MSN Rehab Admissions Coordinator [phone] 10/23/2017 1:14 PM

## 2017-10-23 NOTE — Progress Notes (Signed)
STROKE TEAM PROGRESS NOTE    INTERVAL HISTORY His wife is at the bedside.  Neuro stable.she feels comfortable taking the patient for outpatient therapy visits and does not want inpatient rehabilitation  Vitals:   10/23/17 0001 10/23/17 0526 10/23/17 0800 10/23/17 1244  BP: 140/65 (!) 154/87 (!) 175/95 (!) 151/68  Pulse: (!) 57 (!) 54 (!) 53 (!) 57  Resp: 18 18 17 18   Temp: 99 F (37.2 C) 98.2 F (36.8 C) 98.2 F (36.8 C) 98.6 F (37 C)  TempSrc: Oral Oral Oral Oral  SpO2: 98% 98% 97% 98%  Weight:  79.5 kg    Height:        CBC:  Recent Labs  Lab 10/21/17 0717 10/21/17 1758  WBC 7.4 8.3  NEUTROABS 6.0  --   HGB 13.5 12.9*  HCT 42.0 40.2  MCV 96.3 96.2  PLT 227 027    Basic Metabolic Panel:  Recent Labs  Lab 10/21/17 0717 10/21/17 1758 10/23/17 0432  NA 144  --  144  K 4.1  --  3.8  CL 111  --  115*  CO2 25  --  24  GLUCOSE 206*  --  112*  BUN 13  --  12  CREATININE 1.45* 1.35* 1.30*  CALCIUM 9.3  --  8.7*   Lipid Panel:     Component Value Date/Time   CHOL 140 10/22/2017 0445   TRIG 122 10/22/2017 0445   HDL 34 (L) 10/22/2017 0445   CHOLHDL 4.1 10/22/2017 0445   VLDL 24 10/22/2017 0445   LDLCALC 82 10/22/2017 0445   HgbA1c:  Lab Results  Component Value Date   HGBA1C 6.2 (H) 10/22/2017   Urine Drug Screen:     Component Value Date/Time   LABOPIA NONE DETECTED 10/21/2017 1036   COCAINSCRNUR NONE DETECTED 10/21/2017 1036   LABBENZ NONE DETECTED 10/21/2017 1036   AMPHETMU NONE DETECTED 10/21/2017 1036   THCU NONE DETECTED 10/21/2017 1036   LABBARB NONE DETECTED 10/21/2017 1036    Alcohol Level     Component Value Date/Time   ETH <10 10/21/2017 0717    IMAGING Dg Chest 2 View  Result Date: 10/21/2017 CLINICAL DATA:  Patient with vomiting.  Headache.  Weakness. EXAM: CHEST - 2 VIEW COMPARISON:  None. FINDINGS: Monitoring leads overlie the patient. Normal cardiac and mediastinal contours. Low lung volumes. Bibasilar heterogeneous pulmonary  opacities. No pleural effusion or pneumothorax. Thoracic spine degenerative changes. Possible nodular opacity left upper lung. IMPRESSION: Low lung volumes with bibasilar opacities favored to represent atelectasis. Infection not excluded. Possible nodular opacity left upper lung. Recommend further evaluation with chest CT. Electronically Signed   By: Lovey Newcomer M.D.   On: 10/21/2017 18:17   Mr Brain Wo Contrast  Result Date: 10/21/2017 CLINICAL DATA:  Persistent stroke symptoms. Follow-up with thins through the brainstem EXAM: MRI HEAD WITHOUT CONTRAST TECHNIQUE: Multiplanar, multiecho pulse sequences of the brain and surrounding structures were obtained without intravenous contrast. COMPARISON:  Brain MRI earlier today FINDINGS: Diffusion only imaging shows 5 mm acute infarct along the deep surface of the left brachium pontis. Susceptibility artifact from remote microhemorrhages that is far underestimated relative to previous SWI imaging. IMPRESSION: Diffusion only scan shows a 5 mm acute infarct along the deep surface of the left brachium pontis. Electronically Signed   By: Monte Fantasia M.D.   On: 10/21/2017 16:34    PHYSICAL EXAM Pleasant middle-age African-American male not in distress. . Afebrile. Head is nontraumatic. Neck is supple without bruit.  Cardiac exam no murmur or gallop. Lungs are clear to auscultation. Distal pulses are well felt. Neurological Exam ;  Awake  Alert oriented x 3. Normal speech and language.eye movements full without nystagmus.fundi were not visualized. Vision acuity and fields appear normal. Hearing is normal. Palatal movements are normal. Face asymmetric but over right lower facial weakness. Tongue midline. Normal strength, tone, reflexes and coordination. Diminished touch and pinprick sensation from mid calf down.  . Gait deferred.  ASSESSMENT/PLAN James Henderson is a 67 y.o. [place] male with history of previous stroke, HTN, HLD, DB, glaucoma presenting with  difficulty walking, nausea and vomiting x1 day, headache and generalized weakness.   Stroke:   Left brachium pontis infarct secondary to small vessel disease source  CTA head & neck mild atherosclerotic disease bilateral carotids.  Diffuse intracranial atherosclerotic disease.  No ELVO. Small vessel disease. Atrophy.   MRI no acute infarct.  Small vessel disease.  Moderate atrophy.  Extensive micro-calcifications consistent with uncontrolled hypertension, cannot rule out amyloid.  Left lateral tentorium meningioma 8 x 10 mm (stable since 2015)  Repeat MRI left brachium pontis 5 mm acute infarct 2D Echo Left ventricle: The cavity size was normal. Systolic function was   normal. The estimated ejection fraction was in the range of 55%   to 60%. Wall motion was normal; there were no regional wall   motion abnormalities. The study is indeterminate for the    evaluation of LV diastolic function  LDL 82  HgbA1c 6.2  Lovenox 40 mg sq daily for VTE prophylaxis  aspirin 81 mg daily prior to admission, now on aspirin 325 mg daily and clopidogrel 75 mg daily. Will change to plavix alone (not DAPT as typical) d.t microhemorrhages. Continue at d/c.  Therapy recommendations:  CIR. consult placed  Disposition:  pending   Hypertension  Stable . Permissive hypertension (OK if < 220/120) but gradually normalize in 5-7 days . Long-term BP goal normotensive  Hyperlipidemia  Home meds: Mevacor 40 and fish oil  On Crestor 40 mg in hospital  LDL 82, goal < 70  Continue statin at discharge  Diabetes type II  HgbA1c 6.2, goal < 7.0  Controlled  Other Stroke Risk Factors  Advanced age  Former cigarette smoker  Hx stroke/TIA  11/2013 - Non-dominant small midbrain infarct secondary to small vessel disease source. Placed on 81 mg aspirin daily at that time due to chronic microhemorrhages.  Family hx stroke (mother and father)  Hospital day # 2      He has presented with dizziness  and gait ataxia due to small cerebellar infarct etiology unclear but he is had previous lacunar infarcts and he is not a good long-term anticoagulation candidate given multiple microhemorrhages on his MRI hence we will not pursue further workup for source of embolism. Recommend aspirin alone for stroke prevention.  . Long discussion the patient and wife and answered questions. Greater than 50% time during this 25 minute visit was spent on counseling and coordination of care about his stroke and answered questions.follow-up as an outpatient in the stroke clinic in 6 weeks. Stroke team will sign off. Kindly call for questions.  Antony Contras, MD Medical Director [facility] Pager: [phone] 10/23/2017 2:17 PM  To contact Stroke Continuity provider, please refer to [email or web address] After hours, contact General Neurology

## 2017-10-23 NOTE — Progress Notes (Signed)
Inpatient Rehabilitation Admissions Coordinator  I have begun insurance authorization for a possible inpt rehab admission pending their approval. I will meet with patient and family this morning to discuss.  Danne Baxter, RN, MSN Rehab Admissions Coordinator [phone] 10/23/2017 8:56 AM '

## 2017-10-23 NOTE — Care Management Note (Signed)
Case Management Note  Patient Details  Name: James Henderson MRN: [id number] Date of Birth: [birth year]  Subjective/Objective:         Pt admitted with CVA. He is from home with his spouse.  PcP:     Dr Deneise Lever Insurance: HTA       Action/Plan: Recommendations are for CIR. Patient and wife wants to d/c home with outpatient therapy. CM met with them and they would like to attend [facility]. Orders in Epic and information on the AVS.  Wife to provide 24 hour supervision at home and transportation to home.   Expected Discharge Date:                  Expected Discharge Plan:  OP Rehab  In-House Referral:     Discharge planning Services  CM Consult  Post Acute Care Choice:    Choice offered to:     DME Arranged:    DME Agency:     HH Arranged:    [place] Agency:     Status of Service:  Completed, signed off  If discussed at [employer] of Stay Meetings, dates discussed:    Additional Comments:  Pollie Friar, RN 10/23/2017, 4:16 PM

## 2017-10-23 NOTE — Evaluation (Signed)
Speech Language Pathology Evaluation Patient Details Name: James Henderson MRN: [id number] DOB: [birth year] Today's Date: 10/23/2017 Time: 2297-9892 SLP Time Calculation (min) (ACUTE ONLY): 30 min  Problem List:  Patient Active Problem List   Diagnosis Date Noted  . Diabetes mellitus type 2 in nonobese (HCC)   . Benign essential HTN   . History of CVA (cerebrovascular accident)   . Acute blood loss anemia   . Stage 3 chronic kidney disease ([place])   . Acute CVA (cerebrovascular accident) ([place]) 10/21/2017  . HLD (hyperlipidemia) 01/10/2014  . Acute brainstem infarction ([place]) 12/07/2013  . Dizziness 12/06/2013  . DM (diabetes mellitus) type II controlled, neurological manifestation ([place]) 12/06/2013  . HYPERLIPIDEMIA 02/26/2006  . GLAUCOMA NOS 02/26/2006  . Essential hypertension 02/26/2006   Past Medical History:  Past Medical History:  Diagnosis Date  . Brainstem stroke ([place]) 2015  . Cerebral meningioma ([place])   . Diabetes mellitus without complication ([place])   . Glaucoma   . Hyperlipidemia   . Hypertension    Past Surgical History:  Past Surgical History:  Procedure Laterality Date  . EYE SURGERY     HPI:  67 y.o. male admitted on 10/21/17 for difficulting ambulating, N/V, HA, generalized weakness.  MRI revealed Left brachium pontis infarct secondary to small vessel disease.  Pt with significant PMH of stroke, HTN, glaucoma, DM, and eye surgery.     Assessment / Plan / Recommendation Clinical Impression   Pt presents with mild cognitive deficits in executive functioning, selective attention, retrieval of information, and processing speed.  As a result, pt currently requires min assist to complete evaluation tasks such as clock drawing and 4 word delayed recall.  Pt initially denied any cognitive changes post acute CVA; however, after completion of assessment pt stated that he was "concerned" over his performance and would like ST interventions in order to maximize return to  functional independence.  Agree with PT/OT recommendations for CIR follow up post acute care.      SLP Assessment  SLP Recommendation/Assessment: Patient needs continued Speech Lanaguage Pathology Services SLP Visit Diagnosis: Cognitive communication deficit (R41.841)    Follow Up Recommendations  Inpatient Rehab    Frequency and Duration min 2x/week         SLP Evaluation Cognition  Overall Cognitive Status: Impaired/Different from baseline Arousal/Alertness: Awake/alert Orientation Level: Oriented X4 Attention: Selective Selective Attention: Impaired Selective Attention Impairment: Functional complex Memory: Impaired Memory Impairment: Retrieval deficit Awareness: Impaired Awareness Impairment: Emergent impairment Problem Solving: Impaired Problem Solving Impairment: Functional complex Executive Function: Decision Making;Organizing;Self Monitoring;Self Correcting Organizing: Impaired Organizing Impairment: Functional complex Decision Making: Impaired Decision Making Impairment: Functional complex Self Monitoring: Impaired Self Monitoring Impairment: Functional complex Self Correcting: Impaired Self Correcting Impairment: Functional complex Safety/Judgment: Appears intact       Comprehension  Auditory Comprehension Overall Auditory Comprehension: Appears within functional limits for tasks assessed    Expression Expression Primary Mode of Expression: Verbal Verbal Expression Overall Verbal Expression: Appears within functional limits for tasks assessed Written Expression Dominant Hand: Right   Oral / Motor  Oral Motor/Sensory Function Overall Oral Motor/Sensory Function: Within functional limits Motor Speech Overall Motor Speech: Appears within functional limits for tasks assessed   GO                    Emilio Math 10/23/2017, 9:14 AM

## 2017-10-24 LAB — BASIC METABOLIC PANEL
Anion gap: 7 (ref 5–15)
BUN: 13 mg/dL (ref 8–23)
CO2: 26 mmol/L (ref 22–32)
Calcium: 8.6 mg/dL — ABNORMAL LOW (ref 8.9–10.3)
Chloride: 110 mmol/L (ref 98–111)
Creatinine, Ser: 1.32 mg/dL — ABNORMAL HIGH (ref 0.61–1.24)
GFR calc Af Amer: >60 mL/min (ref >60)
GFR calc non Af Amer: 53 mL/min — ABNORMAL LOW (ref >60)
Glucose, Bld: 120 mg/dL — ABNORMAL HIGH (ref 70–99)
Potassium: 4 mmol/L (ref 3.5–5.1)
Sodium: 143 mmol/L (ref 135–145)

## 2017-10-24 LAB — GLUCOSE, CAPILLARY
Glucose-Capillary: 116 mg/dL — ABNORMAL HIGH (ref 70–99)
Glucose-Capillary: 135 mg/dL — ABNORMAL HIGH (ref 70–99)

## 2017-10-24 MED ORDER — ROSUVASTATIN CALCIUM 40 MG PO TABS: 40.0000 mg | ORAL_TABLET | Freq: Every day | ORAL | 0 refills | Status: DC

## 2017-10-24 MED ORDER — CLOPIDOGREL BISULFATE 75 MG PO TABS: 75.0000 mg | ORAL_TABLET | Freq: Every day | ORAL | 0 refills | Status: DC

## 2017-10-24 NOTE — Progress Notes (Signed)
Occupational Therapy Treatment Patient Details Name: James Henderson MRN: [id number] DOB: 08-23-1950 Today's Date: 10/24/2017    History of present illness 67 y.o. male admitted on 10/21/17 for difficulting ambulating, N/V, HA, generalized weakness.  MRI revealed Left brachium pontis infarct secondary to small vessel disease.  Pt with significant PMH of stroke, HTN, glaucoma, DM, and eye surgery.     OT comments  Pt and wife eager to discharge home.  Wife present and was instructed in pt's balance deficits, and risk of falls.  Enforced with both of them, he is not to get up without direct, physical assistance.  Instructed wife how to best assist him, however, he continues to be at risk for falls due to heavy Lt lean.  Instructed them to not attempt shower transfer without assist from sons, and/or HH providing instruction, and instructed them to use urinal at night.  Wife states she feels comfortable assisting pt.  Changed recommendation to [place].   Follow Up Recommendations  Home health OT;Supervision/Assistance - 24 hour    Equipment Recommendations  Tub/shower bench    Recommendations for Other Services      Precautions / Restrictions Precautions Precautions: Fall Precaution Comments: left lateral lean       Mobility Bed Mobility                  Transfers Overall transfer level: Needs assistance   Transfers: Sit to/from Stand;Stand Pivot Transfers Sit to Stand: Min assist Stand pivot transfers: Mod assist       General transfer comment: assist for balance due to ataxia     Balance Overall balance assessment: Needs assistance Sitting-balance support: Feet supported;No upper extremity supported Sitting balance-Leahy Scale: Fair   Postural control: Left lateral lean Standing balance support: Single extremity supported Standing balance-Leahy Scale: Poor Standing balance comment: requires UE support and assist                            ADL either  performed or assessed with clinical judgement   ADL Overall ADL's : Needs assistance/impaired                     Lower Body Dressing: Minimal assistance;Sit to/from stand Lower Body Dressing Details (indicate cue type and reason): braces self against chair to prevent balance loss.  He requires min A due to imbalance  Toilet Transfer: Moderate assistance;Ambulation;Comfort height toilet   Toileting- Clothing Manipulation and Hygiene: Moderate assistance;Sit to/from stand       Functional mobility during ADLs: Moderate assistance General ADL Comments: wife present and she was instructed in his current deficits and how to safely assist him with ADLs, the need for direct, physical assist when he is up and moving.  She was instructed in use of gait belt and to walk on his Left due to heavy lean.  She was able to return demonstration with therapist very closely guarding due to pt being at very high risk for fall      Vision       Perception     Praxis      Cognition Arousal/Alertness: Awake/alert Behavior During Therapy: WFL for tasks assessed/performed Overall Cognitive Status: Impaired/Different from baseline                     Current Attention Level: Selective     Safety/Judgement: Decreased awareness of safety;Decreased awareness of deficits Awareness: Emergent  Exercises     Shoulder Instructions       General Comments      Pertinent Vitals/ Pain       Pain Assessment: No/denies pain  Home Living                                          Prior Functioning/Environment              Frequency  Min 2X/week        Progress Toward Goals  OT Goals(current goals can now be found in the care plan section)  Progress towards OT goals: Progressing toward goals     Plan Discharge plan needs to be updated    Co-evaluation                 AM-PAC PT "6 Clicks" Daily Activity     Outcome Measure    Help from another person eating meals?: None Help from another person taking care of personal grooming?: A Little Help from another person toileting, which includes using toliet, bedpan, or urinal?: A Lot Help from another person bathing (including washing, rinsing, drying)?: A Lot Help from another person to put on and taking off regular upper body clothing?: A Little Help from another person to put on and taking off regular lower body clothing?: A Little 6 Click Score: 17    End of Session Equipment Utilized During Treatment: Gait belt  OT Visit Diagnosis: Unsteadiness on feet (R26.81);Ataxia, unspecified (R27.0)   Activity Tolerance Patient tolerated treatment well   Patient Left in chair;with call bell/phone within reach;with family/visitor present   Nurse Communication Mobility status        Time: 8372-9021 OT Time Calculation (min): 17 min  Charges: OT General Charges $OT Visit: 1 Visit OT Treatments $Self Care/Home Management : 8-22 mins  Lucille Passy, OTR/L [place] Pager [phone] Office [phone]    Lucille Passy M 10/24/2017, 3:00 PM

## 2017-10-24 NOTE — Progress Notes (Signed)
Pt d/c home. Pt is stable, alert and oriented. No new concerns. D/c instructions given with teach back, pt verbalize understanding. Pt will be transported out of facility by family.

## 2017-10-24 NOTE — Discharge Instructions (Signed)
Hospital Discharge After a Stroke  Being discharged from the hospital after a stroke can feel overwhelming. Many things may be different, and it is normal to feel scared or anxious. Some stroke survivors may be able to return to their homes, and others may need more specialized care on a temporary or permanent basis. Your stroke care team will work with you to develop a discharge plan that is best for you. Ask questions if you do not understand something. Invite a friend or family member to participate in discharge planning. Understanding and following your discharge plan can help to prevent another stroke or other problems. Understanding your medicines After a stroke, your health care provider may prescribe one or more types of medicine. It is important to take medicines exactly as told by your health care provider. Serious harm, such as another stroke, can happen if you are unable to take your medicine exactly as prescribed. Make sure you understand:  What medicine to take.  Why you are taking the medicine.  How and when to take it.  If it can be taken with your other medicines and herbal supplements.  Possible side effects.  When to call your health care provider if you have any side effects.  How you will get and pay for your medicines. Medical assistance programs may be able to help you pay for prescription medicines if you cannot afford them.  If you are taking an anticoagulant, be sure to take it exactly as told by your health care provider. This type of medicine can increase the risk of bleeding because it works to prevent blood from clotting. You may need to take certain precautions to prevent bleeding. You should contact your health care provider if you have:  Bleeding or bruising.  A fall or other injury to your head.  Blood in your urine or stool (feces).  Planning for home safety Take steps to prevent falls, such as installing grab bars or using a shower chair. Ask a friend  or family member to get needed things in place before you go home if possible. A therapist can come to your home to make recommendations for safety equipment. Ask your health care provider if you would benefit from this service or from home care. Getting needed equipment Ask your health care provider for a list of any medical equipment and supplies you will need at home. These may include items such as:  Walkers.  Canes.  Wheelchairs.  Hand-strengthening devices.  Special eating utensils.  Medical equipment can be rented or purchased, depending on your insurance coverage. Check with your insurance company about what is covered. Keeping follow-up visits After a stroke, you will need to follow up regularly with a health care provider. You may also need rehabilitation, which can include physical therapy, occupational therapy, or speech-language therapy. Keeping these appointments is very important to your recovery after a stroke. Be sure to bring your medicine list and discharge papers with you to your appointments. If you need help to keep track of your schedule, use a calendar or appointment reminder. Preventing another stroke Having a stroke puts you at risk for another stroke in the future. Ask your health care provider what actions you can take to lower the risk. These may include:  Increasing how much you exercise.  Making a healthy eating plan.  Quitting smoking.  Managing other health conditions, such as high blood pressure, high cholesterol, or diabetes.  Limiting alcohol use.  Knowing the warning signs of a stroke  Make sure you understand the signs of a stroke. Before you leave the hospital, you will receive information outlining the stroke warning signs. Share these with your friends and family members. "BE FAST" is an easy way to remember the main warning signs of a stroke:  B - Balance. Signs are dizziness, sudden trouble walking, or loss of balance.  E - Eyes. Signs  are trouble seeing or a sudden change in vision.  F - Face. Signs are sudden weakness or numbness of the face, or the face or eyelid drooping on one side.  A - Arms. Signs are weakness or numbness in an arm. This happens suddenly and usually on one side of the body.  S - Speech. Signs are sudden trouble speaking, slurred speech, or trouble understanding what people say.  T - Time. Time to call emergency services. Write down what time symptoms started.  Other signs of stroke may include:  A sudden, severe headache with no known cause.  Nausea or vomiting.  Seizure.  These symptoms may represent a serious problem that is an emergency. Do not wait to see if the symptoms will go away. Get medical help right away. Call your local emergency services (911 in the [place] Do not drive yourself to the hospital. Make note of the time that you had your first symptoms. Your emergency responders or emergency room staff will need to know this information. Summary  Being discharged from the hospital after a stroke can feel overwhelming. It is normal to feel scared or anxious.  Make sure you take medicines exactly as told by your health care provider.  Know the warning signs of a stroke, and get help right way if you have any of these symptoms. "BE FAST" is an easy way to remember the main warning signs of a stroke. This information is not intended to replace advice given to you by your health care provider. Make sure you discuss any questions you have with your health care provider. Document Released: 05/11/2016 Document Revised: 05/11/2016 Document Reviewed: 05/11/2016 Elsevier Interactive Patient Education  [employer].

## 2017-10-24 NOTE — Care Management Note (Signed)
Case Management Note  Patient Details  Name: Stedman [place] MRN: [id number] Date of Birth: 18-Jun-1950  Subjective/Objective:                    Action/Plan: Pt discharging home today. Wife had decided that [facility] services would be better than outpatient at this time. CM provided choice and they selected [facility]. Butch Penny with [facility] notified and accepted the referral.  Wife now states he will need a walker for home. James with [facility] DME notified and will deliver to the room. Wife to provide 24 hour supervision at home and transportation to home.   Expected Discharge Date:  10/24/17               Expected Discharge Plan:  OP Rehab  In-House Referral:     Discharge planning Services  CM Consult  Post Acute Care Choice:  Home Health Choice offered to:  Patient, Spouse  DME Arranged:  Walker rolling DME Agency:  [place] Arranged:  RN, PT, OT, Nurse's Aide, Speech Therapy [place] Agency:  [place]  Status of Service:  Completed, signed off  If discussed at [place] of Stay Meetings, dates discussed:    Additional Comments:  Pollie Friar, RN 10/24/2017, 12:33 PM

## 2017-10-24 NOTE — Discharge Summary (Signed)
Physician Discharge Summary  Cecilia Nishikawa HYQ:[id number] DOB: [birth year] DOA: 10/21/2017  PCP: Lois Huxley, PA  Admit date: 10/21/2017 Discharge date: 10/24/2017  Admitted From: Home Disposition: Home  Recommendations for Outpatient Follow-up:  1. Follow up with PCP in 1-2 weeks 2. Please obtain BMP/CBC in one week   Home Health: Yes, home health PT, OT, RN Equipment/Devices: Walker  Discharge Condition: Stable CODE STATUS: Full Diet recommendation: Heart Healthy / Carb Modified  Brief/Interim Summary:  #) Recurrent acute CVA: Patient was admitted with gait instability and found to have cerebellar stroke from small vessel disease.  Neurology was consulted and recommended clopidogrel 75 mg.  Aspirin was not added to his regimen as he was noted to have multiple microhemorrhages in the brain.  He was evaluated by physical therapy who recommended [facility] inpatient rehab however the family opted to take him home with home health physical therapy and rehab.  His home aspirin 81 mg was discontinued on discharge.  He was transitioned to atorvastatin 40 mg from his home lovastatin.  Echo performed showed normal ejection fraction with no evidence of embolic source.  #) Hypertension/hyperlipidemia: Per above patient was transitioned to home atorvastatin.  Patient's home losartan was held due to acute stroke and restarted prior to discharge.  His blood pressure better controlled.  #) Type 2 diabetes: Patient's home glipizide metformin were held.  He was maintained on sliding scale insulin here.  He may restart his home oral hypoglycemics on discharge.  #) Stage II-III CKD: His creatinine was stable.   Discharge Diagnoses:  Principal Problem:   Acute CVA (cerebrovascular accident) ([place]) Active Problems:   Essential hypertension   Dizziness   DM (diabetes mellitus) type II controlled, neurological manifestation (HCC)   HLD (hyperlipidemia)   Diabetes mellitus type 2 in nonobese (HCC)   Benign  essential HTN   History of CVA (cerebrovascular accident)   Acute blood loss anemia   Stage 3 chronic kidney disease [facility])    Discharge Instructions  Discharge Instructions    Ambulatory referral to Neurology   Complete by:  As directed    An appointment is requested in approximately: 6 weeks at stroke clinic with Dr. Leonie Man   Ambulatory referral to Occupational Therapy   Complete by:  As directed    Ambulatory referral to Physical Therapy   Complete by:  As directed    Ambulatory referral to Speech Therapy   Complete by:  As directed    Call MD for:  difficulty breathing, headache or visual disturbances   Complete by:  As directed    Call MD for:  extreme fatigue   Complete by:  As directed    Call MD for:  hives   Complete by:  As directed    Call MD for:  persistant dizziness or light-headedness   Complete by:  As directed    Call MD for:  persistant nausea and vomiting   Complete by:  As directed    Call MD for:  redness, tenderness, or signs of infection (pain, swelling, redness, odor or green/yellow discharge around incision site)   Complete by:  As directed    Call MD for:  severe uncontrolled pain   Complete by:  As directed    Call MD for:  temperature >100.4   Complete by:  As directed    Diet - low sodium heart healthy   Complete by:  As directed    Discharge instructions   Complete by:  As directed  Please follow-up with your primary care doctor in 1 to 2 weeks.  Please follow-up with the neurologist in approximately 6 weeks.   Increase activity slowly   Complete by:  As directed      Allergies as of 10/24/2017   No Known Allergies     Medication List    STOP taking these medications   aspirin EC 81 MG tablet   lovastatin 40 MG tablet Commonly known as:  MEVACOR     TAKE these medications   acetaminophen 500 MG tablet Commonly known as:  TYLENOL Take 1,000 mg by mouth every 6 (six) hours as needed for headache (pain).   brimonidine 0.2 %  ophthalmic solution Commonly known as:  ALPHAGAN Place 1 drop into both eyes 3 (three) times daily.   clopidogrel 75 MG tablet Commonly known as:  PLAVIX Take 1 tablet (75 mg total) by mouth daily. Start taking on:  10/25/2017   dorzolamide 2 % ophthalmic solution Commonly known as:  TRUSOPT Place 1 drop into both eyes 2 (two) times daily.   Fish Oil 1000 MG Caps Take 1,000 mg by mouth every evening.   fluticasone 50 MCG/ACT nasal spray Commonly known as:  FLONASE Place 1 spray into both nostrils daily as needed for allergies or rhinitis.   glipiZIDE 5 MG tablet Commonly known as:  GLUCOTROL Take 5 mg by mouth daily.   latanoprost 0.005 % ophthalmic solution Commonly known as:  XALATAN Place 1 drop into both eyes at bedtime.   losartan 100 MG tablet Commonly known as:  COZAAR Take 100 mg by mouth daily.   metFORMIN 1000 MG tablet Commonly known as:  GLUCOPHAGE Take 1,000 mg by mouth daily with supper.   NIACIN FLUSH FREE 500 MG Caps Generic drug:  Inositol Niacinate Take 500 mg by mouth every evening.   rosuvastatin 40 MG tablet Commonly known as:  CRESTOR Take 1 tablet (40 mg total) by mouth daily at 6 PM.   SIMBRINZA 1-0.2 % Susp Generic drug:  Brinzolamide-Brimonidine Place 1 drop into both eyes 2 (two) times daily.   SYSTANE ULTRA 0.4-0.3 % Soln Generic drug:  Polyethyl Glycol-Propyl Glycol Place 1 drop into both eyes daily as needed (dryness or itching).   timolol 0.5 % ophthalmic solution Commonly known as:  BETIMOL Place 1 drop into both eyes daily.   Vitamin D3 400 units Caps Take 400 Units by mouth every evening.      Follow-up Information    [facility].   Specialty:  Rehabilitation Why:  They will contact you for the first appointment Contact information: [address]         No Known  Allergies  Consultations:  Neurology  Inpatient rehabilitation   Procedures/Studies: Ct Angio Head W Or Wo Contrast  Result Date: 10/21/2017 CLINICAL DATA:  Focal neuro deficit greater than 6 hours. Suspect stroke. EXAM: CT ANGIOGRAPHY HEAD AND NECK TECHNIQUE: Multidetector CT imaging of the head and neck was performed using the standard protocol during bolus administration of intravenous contrast. Multiplanar CT image reconstructions and MIPs were obtained to evaluate the vascular anatomy. Carotid stenosis measurements (when applicable) are obtained utilizing NASCET criteria, using the distal internal carotid diameter as the denominator. CONTRAST:  64mL ISOVUE-370 IOPAMIDOL (ISOVUE-370) INJECTION 76% COMPARISON:  MRI head 12/07/2013 FINDINGS: CT HEAD FINDINGS Brain: Moderate atrophy. Moderate chronic microvascular ischemic changes in the white matter. Small chronic infarct left occipital lobe unchanged. Negative for  acute infarct, hemorrhage, or mass lesion. No midline shift. Vascular: Negative for hyperdense vessel Skull: Negative Sinuses: Paranasal sinuses clear bilaterally. Orbits:  Bilateral cataract surgery. Review of the MIP images confirms the above findings CTA NECK FINDINGS Aortic arch: Suboptimal arterial opacification due to early timing of the scan. Mild atherosclerotic disease in the aortic arch. Proximal great vessels widely patent Right carotid system: Right common carotid artery widely patent. Mild atherosclerotic disease right carotid bifurcation without significant stenosis. Left carotid system: Left common carotid artery widely patent. Mild atherosclerotic disease left carotid bifurcation without significant stenosis Vertebral arteries: Both vertebral arteries appear patent to the basilar. Proximal vertebral arteries not well visualized due to suboptimal opacification and motion. Skeleton: Moderate cervical spondylosis. Poor dentition with numerous caries and periapical lucency  especially left upper molar Other neck: Negative for mass or adenopathy Upper chest: Negative Review of the MIP images confirms the above findings CTA HEAD FINDINGS Anterior circulation: Atherosclerotic calcification and mild stenosis of the cavernous carotid bilaterally. Severe stenosis of the temporal branch M2 bilaterally. Mild atherosclerotic disease in the M1 segment bilaterally with mild irregularity and no significant stenosis. Multiple areas of stenosis in M3 branches bilaterally. Posterior circulation: Both vertebral arteries patent to the basilar without stenosis. Basilar mildly irregular without stenosis. Left PICA patent. Right PICA not visualized. AICA patent bilaterally. Superior cerebellar and posterior cerebral arteries patent bilaterally. Diffuse atherosclerotic disease and mild to moderate stenosis in the posterior cerebral artery bilaterally. Venous sinuses: Lack of venous enhancement due to timing Anatomic variants: None Delayed phase: Not performed Review of the MIP images confirms the above findings IMPRESSION: Suboptimal CTA technique due to early scanning and suboptimal arterial opacification Mild atherosclerotic disease in the carotid bifurcation bilaterally. No significant carotid or vertebral artery stenosis in the neck. Mild atherosclerotic stenosis in the cavernous carotid bilaterally. Diffuse intracranial atherosclerotic disease with extensive MCA and PCA disease bilaterally. No emergent large vessel occlusion. Atrophy and moderate chronic microvascular ischemia. No acute intracranial abnormality. Electronically Signed   By: Franchot Gallo M.D.   On: 10/21/2017 10:13   Dg Chest 2 View  Result Date: 10/21/2017 CLINICAL DATA:  Patient with vomiting.  Headache.  Weakness. EXAM: CHEST - 2 VIEW COMPARISON:  None. FINDINGS: Monitoring leads overlie the patient. Normal cardiac and mediastinal contours. Low lung volumes. Bibasilar heterogeneous pulmonary opacities. No pleural effusion or  pneumothorax. Thoracic spine degenerative changes. Possible nodular opacity left upper lung. IMPRESSION: Low lung volumes with bibasilar opacities favored to represent atelectasis. Infection not excluded. Possible nodular opacity left upper lung. Recommend further evaluation with chest CT. Electronically Signed   By: Lovey Newcomer M.D.   On: 10/21/2017 18:17   Ct Angio Neck W And/or Wo Contrast  Result Date: 10/21/2017 CLINICAL DATA:  Focal neuro deficit greater than 6 hours. Suspect stroke. EXAM: CT ANGIOGRAPHY HEAD AND NECK TECHNIQUE: Multidetector CT imaging of the head and neck was performed using the standard protocol during bolus administration of intravenous contrast. Multiplanar CT image reconstructions and MIPs were obtained to evaluate the vascular anatomy. Carotid stenosis measurements (when applicable) are obtained utilizing NASCET criteria, using the distal internal carotid diameter as the denominator. CONTRAST:  94mL ISOVUE-370 IOPAMIDOL (ISOVUE-370) INJECTION 76% COMPARISON:  MRI head 12/07/2013 FINDINGS: CT HEAD FINDINGS Brain: Moderate atrophy. Moderate chronic microvascular ischemic changes in the white matter. Small chronic infarct left occipital lobe unchanged. Negative for acute infarct, hemorrhage, or mass lesion. No midline shift. Vascular: Negative for hyperdense vessel Skull: Negative Sinuses: Paranasal sinuses clear bilaterally. Orbits:  Bilateral cataract surgery. Review of the MIP images confirms the above findings CTA NECK FINDINGS Aortic arch: Suboptimal arterial opacification due to early timing of the scan. Mild atherosclerotic disease in the aortic arch. Proximal great vessels widely patent Right carotid system: Right common carotid artery widely patent. Mild atherosclerotic disease right carotid bifurcation without significant stenosis. Left carotid system: Left common carotid artery widely patent. Mild atherosclerotic disease left carotid bifurcation without significant stenosis  Vertebral arteries: Both vertebral arteries appear patent to the basilar. Proximal vertebral arteries not well visualized due to suboptimal opacification and motion. Skeleton: Moderate cervical spondylosis. Poor dentition with numerous caries and periapical lucency especially left upper molar Other neck: Negative for mass or adenopathy Upper chest: Negative Review of the MIP images confirms the above findings CTA HEAD FINDINGS Anterior circulation: Atherosclerotic calcification and mild stenosis of the cavernous carotid bilaterally. Severe stenosis of the temporal branch M2 bilaterally. Mild atherosclerotic disease in the M1 segment bilaterally with mild irregularity and no significant stenosis. Multiple areas of stenosis in M3 branches bilaterally. Posterior circulation: Both vertebral arteries patent to the basilar without stenosis. Basilar mildly irregular without stenosis. Left PICA patent. Right PICA not visualized. AICA patent bilaterally. Superior cerebellar and posterior cerebral arteries patent bilaterally. Diffuse atherosclerotic disease and mild to moderate stenosis in the posterior cerebral artery bilaterally. Venous sinuses: Lack of venous enhancement due to timing Anatomic variants: None Delayed phase: Not performed Review of the MIP images confirms the above findings IMPRESSION: Suboptimal CTA technique due to early scanning and suboptimal arterial opacification Mild atherosclerotic disease in the carotid bifurcation bilaterally. No significant carotid or vertebral artery stenosis in the neck. Mild atherosclerotic stenosis in the cavernous carotid bilaterally. Diffuse intracranial atherosclerotic disease with extensive MCA and PCA disease bilaterally. No emergent large vessel occlusion. Atrophy and moderate chronic microvascular ischemia. No acute intracranial abnormality. Electronically Signed   By: Franchot Gallo M.D.   On: 10/21/2017 10:13   Mr Brain Wo Contrast  Result Date: 10/21/2017 CLINICAL  DATA:  Persistent stroke symptoms. Follow-up with thins through the brainstem EXAM: MRI HEAD WITHOUT CONTRAST TECHNIQUE: Multiplanar, multiecho pulse sequences of the brain and surrounding structures were obtained without intravenous contrast. COMPARISON:  Brain MRI earlier today FINDINGS: Diffusion only imaging shows 5 mm acute infarct along the deep surface of the left brachium pontis. Susceptibility artifact from remote microhemorrhages that is far underestimated relative to previous SWI imaging. IMPRESSION: Diffusion only scan shows a 5 mm acute infarct along the deep surface of the left brachium pontis. Electronically Signed   By: Monte Fantasia M.D.   On: 10/21/2017 16:34   Mr Brain Wo Contrast (neuro Protocol)  Result Date: 10/21/2017 CLINICAL DATA:  Focal neuro deficit greater than 6 hours, suspect stroke. History of diabetes, hyperlipidemia, hypertension, stroke. EXAM: MRI HEAD WITHOUT CONTRAST TECHNIQUE: Multiplanar, multiecho pulse sequences of the brain and surrounding structures were obtained without intravenous contrast. COMPARISON:  CT head 10/21/2017, MRI head 12/07/2013 FINDINGS: Brain: Negative for acute infarct. Moderate atrophy and moderate chronic microvascular ischemia. Widespread chronic microhemorrhage throughout the brain including the cerebellum, brainstem, and cerebral hemispheres bilaterally. This is extensive and has progressed since 2015. No lobar hemorrhage. 8 x 10 mm mass just below the tentorium laterally on the left is unchanged most compatible with meningioma. This is mildly calcified on CT. Vascular: Normal arterial flow void Skull and upper cervical spine: Negative Sinuses/Orbits: Paranasal sinuses clear. Bilateral cataract surgery. Other: None IMPRESSION: 1. Negative for acute infarct 2. Moderate atrophy and moderate chronic microvascular  ischemia 3. Extensive and widespread microcalcifications throughout the cerebellum, brainstem, and both cerebral hemispheres, with  progression since 2015. Favor poorly controlled hypertension although cerebral amyloid is also possible. 4. 8 x 10 mm meningioma left lateral tentorium. Unchanged from 2015. Electronically Signed   By: Franchot Gallo M.D.   On: 10/21/2017 11:54    Echo 10/22/2017: - Left ventricle: The cavity size was normal. Systolic function was normal. The estimated ejection fraction was in the range of 55% to 60%. Wall motion was normal; there were no regional wall motion abnormalities. The study is indeterminate for the evaluation of LV diastolic function. - Aortic valve: There was no regurgitation. - Mitral valve: Calcified annulus. There was no significant regurgitation. - Right ventricle: Systolic function was normal. - Atrial septum: No defect or patent foramen ovale was identified. - Tricuspid valve: There was no significant regurgitation. - Pulmonic valve: There was no significant regurgitation.  Impressions:   - No cardiac source of emboli was indentified.    Subjective:   Discharge Exam: Vitals:   10/24/17 0434 10/24/17 0737  BP: (!) 142/68 (!) 153/84  Pulse: (!) 53 (!) 57  Resp: 18 20  Temp: 97.8 F (36.6 C) 98.4 F (36.9 C)  SpO2: 95% 98%   Vitals:   10/23/17 2002 10/23/17 2337 10/24/17 0434 10/24/17 0737  BP: (!) 155/74 (!) 146/68 (!) 142/68 (!) 153/84  Pulse: (!) 53 (!) 57 (!) 53 (!) 57  Resp: 18 18 18 20   Temp: 98.1 F (36.7 C) 98 F (36.7 C) 97.8 F (36.6 C) 98.4 F (36.9 C)  TempSrc:  Oral Oral Oral  SpO2: 99% 98% 95% 98%  Weight:      Height:       General exam: Appears calm and comfortable  Respiratory system: Clear to auscultation. Respiratory effort normal. Cardiovascular system: Regular rate and rhythm, no murmurs Gastrointestinal system: Abdomen is nondistended, soft and nontender. No organomegaly or masses felt. Normal bowel sounds heard. Central nervous system: Alert and oriented.  Very unsteady on feet, ataxic gait, some facial drooping  but unclear how old, dysarthric due to edentulous state Extremities: Trace lower extremity edema Skin: No rashes visible skin Psychiatry: Judgement and insight appear normal. Mood & affect appropriate.    The results of significant diagnostics from this hospitalization (including imaging, microbiology, ancillary and laboratory) are listed below for reference.     Microbiology: No results found for this or any previous visit (from the past 240 hour(s)).   Labs: BNP (last 3 results) No results for input(s): BNP in the last 8760 hours. Basic Metabolic Panel: Recent Labs  Lab 10/21/17 0717 10/21/17 1758 10/23/17 0432 10/24/17 0519  NA 144  --  144 143  K 4.1  --  3.8 4.0  CL 111  --  115* 110  CO2 25  --  24 26  GLUCOSE 206*  --  112* 120*  BUN 13  --  12 13  CREATININE 1.45* 1.35* 1.30* 1.32*  CALCIUM 9.3  --  8.7* 8.6*   Liver Function Tests: No results for input(s): AST, ALT, ALKPHOS, BILITOT, PROT, ALBUMIN in the last 168 hours. No results for input(s): LIPASE, AMYLASE in the last 168 hours. No results for input(s): AMMONIA in the last 168 hours. CBC: Recent Labs  Lab 10/21/17 0717 10/21/17 1758  WBC 7.4 8.3  NEUTROABS 6.0  --   HGB 13.5 12.9*  HCT 42.0 40.2  MCV 96.3 96.2  PLT 227 200   Cardiac Enzymes: Recent Labs  Lab 10/21/17 1758 10/21/17 2320 10/22/17 0445  TROPONINI <0.03 <0.03 <0.03   BNP: Invalid input(s): POCBNP CBG: Recent Labs  Lab 10/23/17 0626 10/23/17 1208 10/23/17 1621 10/23/17 2220 10/24/17 0644  GLUCAP 110* 153* 108* 152* 116*   D-Dimer No results for input(s): DDIMER in the last 72 hours. Hgb A1c Recent Labs    10/22/17 0445  HGBA1C 6.2*   Lipid Profile Recent Labs    10/22/17 0445  CHOL 140  HDL 34*  LDLCALC 82  TRIG 122  CHOLHDL 4.1   Thyroid function studies No results for input(s): TSH, T4TOTAL, T3FREE, THYROIDAB in the last 72 hours.  Invalid input(s): FREET3 Anemia work up No results for input(s):  VITAMINB12, FOLATE, FERRITIN, TIBC, IRON, RETICCTPCT in the last 72 hours. Urinalysis    Component Value Date/Time   COLORURINE YELLOW 10/21/2017 1036   APPEARANCEUR CLEAR 10/21/2017 1036   LABSPEC 1.040 (H) 10/21/2017 1036   PHURINE 5.0 10/21/2017 1036   GLUCOSEU 50 (A) 10/21/2017 1036   HGBUR MODERATE (A) 10/21/2017 1036   BILIRUBINUR NEGATIVE 10/21/2017 1036   KETONESUR NEGATIVE 10/21/2017 1036   PROTEINUR NEGATIVE 10/21/2017 1036   UROBILINOGEN 0.2 12/07/2013 0032   NITRITE NEGATIVE 10/21/2017 1036   LEUKOCYTESUR NEGATIVE 10/21/2017 1036   Sepsis Labs Invalid input(s): PROCALCITONIN,  WBC,  LACTICIDVEN Microbiology No results found for this or any previous visit (from the past 240 hour(s)).   Time coordinating discharge: 25  SIGNED:   Cristy Folks, MD  [facility] 10/24/2017, 10:21 AM  If 7PM-7AM, please contact night-coverage [email or web address] Password [email or web address]

## 2017-10-24 NOTE — Progress Notes (Signed)
Physical Therapy Treatment Patient Details Name: James Henderson MRN: [id number] DOB: November 17, 1950 Today's Date: 10/24/2017    History of Present Illness 67 y.o. male admitted on 10/21/17 for difficulting ambulating, N/V, HA, generalized weakness.  MRI revealed Left brachium pontis infarct secondary to small vessel disease.  Pt with significant PMH of stroke, HTN, glaucoma, DM, and eye surgery.      PT Comments    Patient seen for mobility progression. Gait training with +2 HHA and RW this session. Pt continues to present with L lateral lean and with little improvement using AD. Pt continues to be a high fall risk given impaired balance and gait deviations. Continue to recommend post acute rehab for further skilled PT services to maximize independence and safety with mobility.    Follow Up Recommendations  CIR     Equipment Recommendations  Rolling walker with 5" wheels    Recommendations for Other Services Rehab consult     Precautions / Restrictions Precautions Precautions: Fall Precaution Comments: left lateral lean    Mobility  Bed Mobility Overal bed mobility: Needs Assistance Bed Mobility: Supine to Sit     Supine to sit: Supervision     General bed mobility comments: for safety  Transfers Overall transfer level: Needs assistance   Transfers: Sit to/from Stand Sit to Stand: Min assist         General transfer comment: assist for balance and increased time and effort to power up into standing   Ambulation/Gait Ambulation/Gait assistance: Mod assist;+2 physical assistance Gait Distance (Feet): (167ft X2 with seated break) Assistive device: 2 person hand held assist;Rolling walker (2 wheeled) Gait Pattern/deviations: Step-through pattern;Staggering left;Drifts right/left;Narrow base of support     General Gait Details: assistance required for balance and weight shifting due to L lateral lean and staggering L; Pt with increasing L LE weakness and less coordinated  movements with increased distance    Stairs Stairs: Yes Stairs assistance: Mod assist Stair Management: No rails;Step to pattern;Forwards Number of Stairs: 2 General stair comments: assistance for balance; cues for sequencing and technique   Wheelchair Mobility    Modified Rankin (Stroke Patients Only) Modified Rankin (Stroke Patients Only) Pre-Morbid Rankin Score: No symptoms Modified Rankin: Moderately severe disability     Balance Overall balance assessment: Needs assistance Sitting-balance support: Feet supported;No upper extremity supported Sitting balance-Leahy Scale: Fair Sitting balance - Comments: LOB to L side while donning socks Postural control: Left lateral lean Standing balance support: Bilateral upper extremity supported Standing balance-Leahy Scale: Poor                              Cognition Arousal/Alertness: Awake/alert Behavior During Therapy: WFL for tasks assessed/performed Overall Cognitive Status: Impaired/Different from baseline Area of Impairment: Safety/judgement;Following commands;Problem solving                       Following Commands: Follows one step commands consistently;Follows one step commands with increased time Safety/Judgement: Decreased awareness of safety;Decreased awareness of deficits Awareness: Emergent Problem Solving: Requires verbal cues;Difficulty sequencing        Exercises      General Comments        Pertinent Vitals/Pain Pain Assessment: No/denies pain    Home Living                      Prior Function            PT Goals (  current goals can now be found in the care plan section) Acute Rehab PT Goals Patient Stated Goal: to return to work Progress towards PT goals: Progressing toward goals    Frequency    Min 4X/week      PT Plan Current plan remains appropriate    Co-evaluation              AM-PAC PT "6 Clicks" Daily Activity  Outcome Measure   Difficulty turning over in bed (including adjusting bedclothes, sheets and blankets)?: A Little Difficulty moving from lying on back to sitting on the side of the bed? : A Little Difficulty sitting down on and standing up from a chair with arms (e.g., wheelchair, bedside commode, etc,.)?: Unable Help needed moving to and from a bed to chair (including a wheelchair)?: A Little Help needed walking in hospital room?: A Lot Help needed climbing 3-5 steps with a railing? : A Lot 6 Click Score: 14    End of Session Equipment Utilized During Treatment: Gait belt Activity Tolerance: Patient tolerated treatment well Patient left: in chair;with call bell/phone within reach;with chair alarm set Nurse Communication: Mobility status PT Visit Diagnosis: Muscle weakness (generalized) (M62.81);Ataxic gait (R26.0);Difficulty in walking, not elsewhere classified (R26.2);Hemiplegia and hemiparesis Hemiplegia - Right/Left: Left Hemiplegia - dominant/non-dominant: Non-dominant Hemiplegia - caused by: Cerebral infarction     Time: 0921-0945 PT Time Calculation (min) (ACUTE ONLY): 24 min  Charges:  $Gait Training: 8-22 mins $Therapeutic Activity: 8-22 mins                     Earney Navy, PTA Pager: [phone]     Darliss Cheney 10/24/2017, 10:55 AM

## 2017-10-24 NOTE — Care Management Important Message (Signed)
Important Message  Patient Details  Name: James Henderson MRN: [id number] Date of Birth: 10-Dec-1950   Medicare Important Message Given:  Yes    Jamica Woodyard 10/24/2017, 2:16 PM

## 2017-10-28 ENCOUNTER — Other Ambulatory Visit: Payer: Self-pay | Admitting: *Deleted

## 2017-10-28 NOTE — Patient Outreach (Signed)
[place] [facility]) Care Management  10/28/2017  Rhyker Moustafa 27-Nov-1950 [id number]   EMMI-stroke after admission 10/21/17 to 10/24/17 for acute cva  RED ON EMMI ALERT Day # 3 Date: 10/27/17 1008 Red Alert Reason: feeling worse overall? Yes Able to eat and drink? No  Questions/problems with meds? yes   Outreach attempt # 1 successful at the home number  Patient is able to verify HIPAA [place] Management RN reviewed and addressed red alert with patient Mr. Wareing confirms the answer to the EMMI questions were all incorrect  For Feeling worse overall? the answer should have been No He reports "I feel better each day"  For able to eat and drink? The answer should have been yes He reports eating and drinking well "I have a good appetite."  For Questions/problems with meds? He reports the answer should be "no" He denies questions/problems with meds He reports he went after discharge and purchased all his meds without issues with cost, side effects or questions  Home health has not shown yet but he reports he has been contacted by therapy staff. He reports he is not able to walk by himself and uses a w/c He confirms a fall x 1   Social: Lives with his wife and children who care for him He is needing assistance with all care He was discharged home with [facility] services to include RN, PT, OT, Nurse's Aide, Speech Therapy  Conditions: Acute CVA, HTN, DM type II, acute brainstem infarction, stage 3 chronic kidney disease, hyperlipidemia, glaucoma, hyperlipidemia, acute blood loss anemia  DM type 2 He reports checking cbg twice a day with reading from 106 - 116 w/c    Medications: denies concerns with taking medications as prescribed, affording medications, side effects of medications and questions about medications  Appointments: Scheduled to see his neurologist on 11/01/17   Advance Directives: Denies need for assist with or assist with changes for advance directives    Consent: [facility] RN CM reviewed [facility] services with patient. Patient gave verbal consent for services.  He denies need of services from [facility] Community/Telephonic RN CM, pharmacy, health coach, NP or SW at this time   Advised patient that there will be further automated EMMI- post discharge calls to assess how the patient is doing following the recent hospitalization Advised the patient that another call may be received from a nurse if any of their responses were abnormal. Patient voiced understanding and was appreciative of f/u call.   Plan: [facility] RN CM discussed with Mr Sooy and he agree [facility] RN CM will follow up with Mr Nee in 10-14 business days for the second stroke EMMI call and plan for case closure if no identified needs   Donivin Wirt L. Lavina Hamman, RN, BSN, [place] Coordinator Office number [phone] Mobile number [phone]  Main [facility] number [phone] Fax number [phone]

## 2017-10-29 ENCOUNTER — Other Ambulatory Visit: Payer: Self-pay

## 2017-10-29 ENCOUNTER — Encounter: Payer: Self-pay | Admitting: Neurology

## 2017-10-29 NOTE — Patient Outreach (Signed)
James Henderson [facility]) Care Management  [place]   10/29/2017  James Henderson 25-May-1950 [id number]   67 year old male outreached by [place] services for a 30 day post discharge medication review.  PMHx includes, but not limited to, hypertension, acute CVA, type 2 diabetes mellitus, CKD stage 3, glaucoma, and hyperlipidemia.  Successful outreach to James Henderson.  HIPAA identifiers verified.   Subjective: James Henderson reports that he is doing okay.  He states that he cannot walk without assistance and that he is also using his walker.   He states that he has not had a visit by [facility] yet, but they have outreached to him.  Encouraged his wife to call and gave them [facility] number to schedule PT appointment if they don't call him soon.   Patient states that he checks his glucose twice daily with values between 105-120 mg/dL.  He states that he watches his diet to help control his glucose.  He declined [facility] diabetic health coaching services.   He reports that he has a PCP appointment next week.   Objective:  HgA1c 6.2% on 10/22/17 SCr 1.32mg /dlL on 10/24/17, estimated CrCl of 53.6 ml/min  Current Medications: Current Outpatient Medications  Medication Sig Dispense Refill  . acetaminophen (TYLENOL) 500 MG tablet Take 1,000 mg by mouth every 6 (six) hours as needed for headache (pain).    . Brinzolamide-Brimonidine (SIMBRINZA) 1-0.2 % SUSP Place 1 drop into both eyes 2 (two) times daily.     . Cholecalciferol (VITAMIN D3) 400 units CAPS Take 400 Units by mouth every evening.    . clopidogrel (PLAVIX) 75 MG tablet Take 1 tablet (75 mg total) by mouth daily. 30 tablet 0  . glipiZIDE (GLUCOTROL) 5 MG tablet Take 5 mg by mouth daily.    . Inositol Niacinate (NIACIN FLUSH FREE) 500 MG CAPS Take 500 mg by mouth every evening.    . latanoprost (XALATAN) 0.005 % ophthalmic solution Place 1 drop into both eyes at bedtime.  1  . losartan (COZAAR) 100 MG tablet Take 100 mg by mouth  daily.  1  . metFORMIN (GLUCOPHAGE) 1000 MG tablet Take 1,000 mg by mouth daily with supper.  1  . Omega-3 Fatty Acids (FISH OIL) 1000 MG CAPS Take 1,000 mg by mouth every evening.    . rosuvastatin (CRESTOR) 40 MG tablet Take 1 tablet (40 mg total) by mouth daily at 6 PM. 30 tablet 0  . timolol (BETIMOL) 0.5 % ophthalmic solution Place 1 drop into both eyes daily.     . brimonidine (ALPHAGAN) 0.2 % ophthalmic solution Place 1 drop into both eyes 3 (three) times daily.  4  . dorzolamide (TRUSOPT) 2 % ophthalmic solution Place 1 drop into both eyes 2 (two) times daily.  4  . fluticasone (FLONASE) 50 MCG/ACT nasal spray Place 1 spray into both nostrils daily as needed for allergies or rhinitis.    James Henderson Glycol-Propyl Glycol (SYSTANE ULTRA) 0.4-0.3 % SOLN Place 1 drop into both eyes daily as needed (dryness or itching).     No current facility-administered medications for this visit.     Functional Status: In your present state of health, do you have any difficulty performing the following activities: 10/22/2017  Hearing? N  Vision? N  Difficulty concentrating or making decisions? N  Walking or climbing stairs? Y  Dressing or bathing? N  Doing errands, shopping? Y  Some recent data might be hidden    Fall/Depression Screening: No flowsheet data  found. PHQ 2/9 Scores 10/28/2017  PHQ - 2 Score 0   ASSESSMENT: Date Discharged from Hospital: 10/24/17 Date Medication Reconciliation Performed: 10/29/2017  Medications Discontinued at Discharge:  aspirin  lovastatin  New Medications at Discharge:   atrovastatin  clopidogrel  Patient was recently discharged from hospital and all medications have been reviewed  Drugs sorted by system:  Cardiovascular: clopidogrel, niacin, losartan, omega 3 fatty acid, rosuvastatin   Pulmonary/Allergy: fluticasone nasal  Endocrine: glipizide, metformin  Topical: brinzolamide/brimonidine (Simbrinza), brimonidine and dorzolamine after Simbrinza  runs out, timolol latanoprost, systane ultra   Pain: acetaminophen  Vitamins/Minerals: cholecalciferol  Drug interactions:  Clopidogrel may increase the plasma concentrations and pharmacologic effects of rosuvastatin.   PLAN: Route note to PCP, James Henderson, James Henderson, James Henderson [phone]

## 2017-11-02 DIAGNOSIS — Z87891 Personal history of nicotine dependence: Secondary | ICD-10-CM | POA: Diagnosis not present

## 2017-11-02 DIAGNOSIS — N183 Chronic kidney disease, stage 3 (moderate): Secondary | ICD-10-CM | POA: Diagnosis not present

## 2017-11-02 DIAGNOSIS — E1122 Type 2 diabetes mellitus with diabetic chronic kidney disease: Secondary | ICD-10-CM | POA: Diagnosis not present

## 2017-11-02 DIAGNOSIS — I69393 Ataxia following cerebral infarction: Secondary | ICD-10-CM | POA: Diagnosis not present

## 2017-11-02 DIAGNOSIS — Z7984 Long term (current) use of oral hypoglycemic drugs: Secondary | ICD-10-CM | POA: Diagnosis not present

## 2017-11-02 DIAGNOSIS — I700 Atherosclerosis of aorta: Secondary | ICD-10-CM | POA: Diagnosis not present

## 2017-11-02 DIAGNOSIS — Z7902 Long term (current) use of antithrombotics/antiplatelets: Secondary | ICD-10-CM | POA: Diagnosis not present

## 2017-11-02 DIAGNOSIS — H538 Other visual disturbances: Secondary | ICD-10-CM | POA: Diagnosis not present

## 2017-11-02 DIAGNOSIS — R2689 Other abnormalities of gait and mobility: Secondary | ICD-10-CM | POA: Diagnosis not present

## 2017-11-02 DIAGNOSIS — E1149 Type 2 diabetes mellitus with other diabetic neurological complication: Secondary | ICD-10-CM | POA: Diagnosis not present

## 2017-11-02 DIAGNOSIS — I129 Hypertensive chronic kidney disease with stage 1 through stage 4 chronic kidney disease, or unspecified chronic kidney disease: Secondary | ICD-10-CM | POA: Diagnosis not present

## 2017-11-02 DIAGNOSIS — D62 Acute posthemorrhagic anemia: Secondary | ICD-10-CM | POA: Diagnosis not present

## 2017-11-02 DIAGNOSIS — D320 Benign neoplasm of cerebral meninges: Secondary | ICD-10-CM | POA: Diagnosis not present

## 2017-11-02 DIAGNOSIS — G8191 Hemiplegia, unspecified affecting right dominant side: Secondary | ICD-10-CM | POA: Diagnosis not present

## 2017-11-02 DIAGNOSIS — R471 Dysarthria and anarthria: Secondary | ICD-10-CM | POA: Diagnosis not present

## 2017-11-02 DIAGNOSIS — I69398 Other sequelae of cerebral infarction: Secondary | ICD-10-CM | POA: Diagnosis not present

## 2017-11-02 DIAGNOSIS — M47812 Spondylosis without myelopathy or radiculopathy, cervical region: Secondary | ICD-10-CM | POA: Diagnosis not present

## 2017-11-04 ENCOUNTER — Other Ambulatory Visit: Payer: Self-pay | Admitting: *Deleted

## 2017-11-04 NOTE — Patient Outreach (Signed)
[facility]) Care Management  11/04/2017  James Henderson Jul 18, 1950 [id number]   EMMI-  RED ON EMMI ALERT after admission 10/21/17 to 10/24/17 for acute cva Day # 9 Date: 11/02/17 1000 Red Alert Reason: lost interest in things they used to enjoy? yes   Outreach attempt # 1 unsuccessful to the home number  No answer. [facility] RN CM left HIPAA compliant voicemail message along with CM's contact info.   Plan: [facility] RN CM will attempt a second call to this [name] engaged patient within 4 business days   James L. Lavina Hamman, RN, BSN, [place] Coordinator Office number [phone] Mobile number [phone]  Main [facility] number [phone] Fax number [phone]

## 2017-11-05 ENCOUNTER — Ambulatory Visit: Payer: Self-pay | Admitting: *Deleted

## 2017-11-05 DIAGNOSIS — N401 Enlarged prostate with lower urinary tract symptoms: Secondary | ICD-10-CM | POA: Diagnosis not present

## 2017-11-05 DIAGNOSIS — I10 Essential (primary) hypertension: Secondary | ICD-10-CM | POA: Diagnosis not present

## 2017-11-05 DIAGNOSIS — E7800 Pure hypercholesterolemia, unspecified: Secondary | ICD-10-CM | POA: Diagnosis not present

## 2017-11-05 DIAGNOSIS — I639 Cerebral infarction, unspecified: Secondary | ICD-10-CM | POA: Diagnosis not present

## 2017-11-05 DIAGNOSIS — N183 Chronic kidney disease, stage 3 (moderate): Secondary | ICD-10-CM | POA: Diagnosis not present

## 2017-11-05 DIAGNOSIS — Z7984 Long term (current) use of oral hypoglycemic drugs: Secondary | ICD-10-CM | POA: Diagnosis not present

## 2017-11-05 DIAGNOSIS — E1122 Type 2 diabetes mellitus with diabetic chronic kidney disease: Secondary | ICD-10-CM | POA: Diagnosis not present

## 2017-11-06 ENCOUNTER — Other Ambulatory Visit: Payer: Self-pay | Admitting: *Deleted

## 2017-11-06 NOTE — Patient Outreach (Signed)
[place] [facility]) Care Management  11/06/2017  James Henderson 1950/09/12 [id number]   EMMI-  RED ON EMMI ALERT after admission 10/21/17 to 10/24/17 for acute cva Day # 9 Date: 11/02/17 1000 Red Alert Reason: lost interest in things they used to enjoy? yes  Outreach attempt # 1 successful to the home number  Patient is able to verify HIPAA Reviewed and addressed referral to [facility] with patient  [name] states this answer to EMMI is incorrect  He states he does not remember being askedthe question He informed CM he may have been asleep when the call came and may not have understood the question.  He denies lost of interest in things but then he discusses being frustrated that when he walks he has to use a walker in house and wife is assisting a lot. He confirms he continues to receive PT services.   He also reports not feeling comfortable and safe in the shower to complete showering tasks alone.  He reports he has discussed this with the PT staff and they have not responded at this time to interventions that can be done.  [facility] RN CM discussed a possible referral to [facility] SW to assist with possible resource for bathroom/shower safety DME such as shower bars. He wants to await an answer from the PT staff and discuss with CM on a follow up call if [facility] SW referral is needed.      Plan: [facility] RN CM will close the 11/02/17 EMMI alert and follow up with [name] for the second stroke EMMI follow call within 4-7 business days as discussed with him today   Joelene Millin L. Lavina Hamman, RN, BSN, [place] Coordinator Office number [phone] Mobile number [phone]  Main [facility] number [phone] Fax number [phone]

## 2017-11-11 ENCOUNTER — Ambulatory Visit: Payer: PPO | Admitting: *Deleted

## 2017-11-13 ENCOUNTER — Other Ambulatory Visit: Payer: Self-pay | Admitting: *Deleted

## 2017-11-13 NOTE — Patient Outreach (Signed)
[place] [facility]) Care Management  11/13/2017  James Henderson 09-08-1950 [id number]   EMMI-stroke follow up calls after 2 weeks and case closure   Outreach attempt  successful to the home number Patient is able to verify HIPAA Reviewed and addressed referral to [facility] patient   [name] states he is doing "good" today only problem is continuing to work on his leg He reports just started [facility] PT sessions intensively and seeing improvements   Wife hs a hip replacement some DME for bathroom   He continues to be frustrated that when he walks he has to use a walker in house and wife is assisting a lot. He reports HH PT is working on making him feel safe in the shower to complete showering tasks alone.  He denies a need for a possible referral to [facility] SW to assist with possible resource for bathroom/shower safety DME such as shower bars.  He denies any other medical concerns/needs on today   Plan: [facility] RN CM willclose this case as [name] states he is doing well, PT assisting with bathroom and he was encouraged to contact CM prn    Kimberly L. Lavina Hamman, RN, BSN, [place] Coordinator Office number [phone] Mobile number [phone]  Main [facility] number [phone] Fax number [phone]

## 2017-12-04 DIAGNOSIS — M6281 Muscle weakness (generalized): Secondary | ICD-10-CM | POA: Diagnosis not present

## 2017-12-04 DIAGNOSIS — I639 Cerebral infarction, unspecified: Secondary | ICD-10-CM | POA: Diagnosis not present

## 2017-12-04 DIAGNOSIS — I10 Essential (primary) hypertension: Secondary | ICD-10-CM | POA: Diagnosis not present

## 2017-12-04 DIAGNOSIS — R262 Difficulty in walking, not elsewhere classified: Secondary | ICD-10-CM | POA: Diagnosis not present

## 2017-12-05 DIAGNOSIS — M6281 Muscle weakness (generalized): Secondary | ICD-10-CM | POA: Diagnosis not present

## 2017-12-05 DIAGNOSIS — I639 Cerebral infarction, unspecified: Secondary | ICD-10-CM | POA: Diagnosis not present

## 2017-12-05 DIAGNOSIS — R262 Difficulty in walking, not elsewhere classified: Secondary | ICD-10-CM | POA: Diagnosis not present

## 2017-12-05 DIAGNOSIS — I10 Essential (primary) hypertension: Secondary | ICD-10-CM | POA: Diagnosis not present

## 2017-12-09 DIAGNOSIS — I10 Essential (primary) hypertension: Secondary | ICD-10-CM | POA: Diagnosis not present

## 2017-12-09 DIAGNOSIS — R262 Difficulty in walking, not elsewhere classified: Secondary | ICD-10-CM | POA: Diagnosis not present

## 2017-12-09 DIAGNOSIS — M6281 Muscle weakness (generalized): Secondary | ICD-10-CM | POA: Diagnosis not present

## 2017-12-09 DIAGNOSIS — I639 Cerebral infarction, unspecified: Secondary | ICD-10-CM | POA: Diagnosis not present

## 2017-12-11 DIAGNOSIS — I10 Essential (primary) hypertension: Secondary | ICD-10-CM | POA: Diagnosis not present

## 2017-12-11 DIAGNOSIS — R262 Difficulty in walking, not elsewhere classified: Secondary | ICD-10-CM | POA: Diagnosis not present

## 2017-12-11 DIAGNOSIS — I639 Cerebral infarction, unspecified: Secondary | ICD-10-CM | POA: Diagnosis not present

## 2017-12-11 DIAGNOSIS — M6281 Muscle weakness (generalized): Secondary | ICD-10-CM | POA: Diagnosis not present

## 2017-12-13 DIAGNOSIS — R262 Difficulty in walking, not elsewhere classified: Secondary | ICD-10-CM | POA: Diagnosis not present

## 2017-12-13 DIAGNOSIS — I10 Essential (primary) hypertension: Secondary | ICD-10-CM | POA: Diagnosis not present

## 2017-12-13 DIAGNOSIS — I639 Cerebral infarction, unspecified: Secondary | ICD-10-CM | POA: Diagnosis not present

## 2017-12-13 DIAGNOSIS — M6281 Muscle weakness (generalized): Secondary | ICD-10-CM | POA: Diagnosis not present

## 2017-12-16 DIAGNOSIS — R262 Difficulty in walking, not elsewhere classified: Secondary | ICD-10-CM | POA: Diagnosis not present

## 2017-12-16 DIAGNOSIS — I639 Cerebral infarction, unspecified: Secondary | ICD-10-CM | POA: Diagnosis not present

## 2017-12-16 DIAGNOSIS — M6281 Muscle weakness (generalized): Secondary | ICD-10-CM | POA: Diagnosis not present

## 2017-12-16 DIAGNOSIS — I10 Essential (primary) hypertension: Secondary | ICD-10-CM | POA: Diagnosis not present

## 2017-12-18 DIAGNOSIS — I639 Cerebral infarction, unspecified: Secondary | ICD-10-CM | POA: Diagnosis not present

## 2017-12-18 DIAGNOSIS — I10 Essential (primary) hypertension: Secondary | ICD-10-CM | POA: Diagnosis not present

## 2017-12-18 DIAGNOSIS — R262 Difficulty in walking, not elsewhere classified: Secondary | ICD-10-CM | POA: Diagnosis not present

## 2017-12-18 DIAGNOSIS — M6281 Muscle weakness (generalized): Secondary | ICD-10-CM | POA: Diagnosis not present

## 2017-12-23 NOTE — Progress Notes (Deleted)
NEUROLOGY CONSULTATION NOTE  Helmuth Recupero MRN: [id number] DOB: [birth year]  Referring provider: Thomes Dinning, MD (hospital referral) Primary care provider: Marilynne Drivers, PA  Reason for consult: Stroke  HISTORY OF PRESENT ILLNESS: James Henderson is a 67 year old ***-handed male with hypertension, hyperlipidemia, type 2 diabetes mellitus, cerebral meningioma, and history of stroke who presents for recent stroke.  History supplemented by hospital notes.  MRI and CTA personally reviewed.  Patient was admitted to [facility] from 10/21/2017 to 10/24/2017 after presenting with ataxia.  MRI of the brain without contrast revealed chronic small vessel ischemic changes and chronic microhemorrhages in the cerebral hemispheres and brainstem (due to hypertension) and known incidental 8 x 10 mm meningioma in the left lateral tentorium (unchanged from 2015) but no acute infarct.  A follow-up MRI of the brain with thin slices through the brainstem revealed 5 mm acute infarct in the left brachium pontis.  CTA of the head and neck revealed diffuse intracranial atherosclerotic disease but no significant large vessel occlusion and mild atherosclerotic disease in both carotid arteries.  2D echocardiogram revealed normal ejection fraction of 55% to 60% with no regional wall motion abnormalities.  LDL was 82.  Hemoglobin A1c was 6.2.  He was discharged on aspirin 325 mg and Plavix 75 mg daily and Crestor 40 mg daily.  He previously had a small midbrain infarct back in 2015.  At that time he was placed on aspirin 81 mg daily.  PAST MEDICAL HISTORY: Past Medical History:  Diagnosis Date  . Brainstem stroke ([place]) 2015  . Cerebral meningioma ([place])   . Diabetes mellitus without complication ([place])   . Glaucoma   . Hyperlipidemia   . Hypertension     PAST SURGICAL HISTORY: Past Surgical History:  Procedure Laterality Date  . EYE SURGERY      MEDICATIONS: Current Outpatient Medications on File Prior to  Visit  Medication Sig Dispense Refill  . acetaminophen (TYLENOL) 500 MG tablet Take 1,000 mg by mouth every 6 (six) hours as needed for headache (pain).    . brimonidine (ALPHAGAN) 0.2 % ophthalmic solution Place 1 drop into both eyes 3 (three) times daily.  4  . Brinzolamide-Brimonidine (SIMBRINZA) 1-0.2 % SUSP Place 1 drop into both eyes 2 (two) times daily.     . Cholecalciferol (VITAMIN D3) 400 units CAPS Take 400 Units by mouth every evening.    . clopidogrel (PLAVIX) 75 MG tablet Take 1 tablet (75 mg total) by mouth daily. 30 tablet 0  . dorzolamide (TRUSOPT) 2 % ophthalmic solution Place 1 drop into both eyes 2 (two) times daily.  4  . fluticasone (FLONASE) 50 MCG/ACT nasal spray Place 1 spray into both nostrils daily as needed for allergies or rhinitis.    Marland Kitchen glipiZIDE (GLUCOTROL) 5 MG tablet Take 5 mg by mouth daily.    . Inositol Niacinate (NIACIN FLUSH FREE) 500 MG CAPS Take 500 mg by mouth every evening.    . latanoprost (XALATAN) 0.005 % ophthalmic solution Place 1 drop into both eyes at bedtime.  1  . losartan (COZAAR) 100 MG tablet Take 100 mg by mouth daily.  1  . metFORMIN (GLUCOPHAGE) 1000 MG tablet Take 1,000 mg by mouth daily with supper.  1  . Omega-3 Fatty Acids (FISH OIL) 1000 MG CAPS Take 1,000 mg by mouth every evening.    Vladimir Faster Glycol-Propyl Glycol (SYSTANE ULTRA) 0.4-0.3 % SOLN Place 1 drop into both eyes daily as needed (dryness or itching).    Marland Kitchen  rosuvastatin (CRESTOR) 40 MG tablet Take 1 tablet (40 mg total) by mouth daily at 6 PM. 30 tablet 0  . timolol (BETIMOL) 0.5 % ophthalmic solution Place 1 drop into both eyes daily.      No current facility-administered medications on file prior to visit.     ALLERGIES: No Known Allergies  FAMILY HISTORY: Family History  Problem Relation Age of Onset  . Stroke Mother   . Stroke Brother   . Diabetes type II Brother    ***.  SOCIAL HISTORY: Social History   Socioeconomic History  . Marital status: Single      Spouse name: Not on file  . Number of children: 1  . Years of education: 56  . Highest education level: Not on file  Occupational History  . Not on file  Social Needs  . Financial resource strain: Not on file  . Food insecurity:    Worry: Not on file    Inability: Not on file  . Transportation needs:    Medical: Not on file    Non-medical: Not on file  Tobacco Use  . Smoking status: Former Research scientist (life sciences)  . Smokeless tobacco: Never Used  Substance and Sexual Activity  . Alcohol use: No    Alcohol/week: 0.0 standard drinks  . Drug use: No  . Sexual activity: Not on file  Lifestyle  . Physical activity:    Days per week: Not on file    Minutes per session: Not on file  . Stress: Not on file  Relationships  . Social connections:    Talks on phone: Not on file    Gets together: Not on file    Attends religious service: Not on file    Active member of club or organization: Not on file    Attends meetings of clubs or organizations: Not on file    Relationship status: Not on file  . Intimate partner violence:    Fear of current or ex partner: Not on file    Emotionally abused: Not on file    Physically abused: Not on file    Forced sexual activity: Not on file  Other Topics Concern  . Not on file  Social History Narrative   Patient is married with one biological child and one adopted.   Patient is right handed.   Patient has hs education.   Patient drinks caffeine rarely.    REVIEW OF SYSTEMS: Constitutional: No fevers, chills, or sweats, no generalized fatigue, change in appetite Eyes: No visual changes, double vision, eye pain Ear, nose and throat: No hearing loss, ear pain, nasal congestion, sore throat Cardiovascular: No chest pain, palpitations Respiratory:  No shortness of breath at rest or with exertion, wheezes GastrointestinaI: No nausea, vomiting, diarrhea, abdominal pain, fecal incontinence Genitourinary:  No dysuria, urinary retention or  frequency Musculoskeletal:  No neck pain, back pain Integumentary: No rash, pruritus, skin lesions Neurological: as above Psychiatric: No depression, insomnia, anxiety Endocrine: No palpitations, fatigue, diaphoresis, mood swings, change in appetite, change in weight, increased thirst Hematologic/Lymphatic:  No purpura, petechiae. Allergic/Immunologic: no itchy/runny eyes, nasal congestion, recent allergic reactions, rashes  PHYSICAL EXAM: *** General: No acute distress.  Patient appears ***-groomed.  *** Head:  Normocephalic/atraumatic Eyes:  fundi examined but not visualized Neck: supple, no paraspinal tenderness, full range of motion Back: No paraspinal tenderness Heart: regular rate and rhythm Lungs: Clear to auscultation bilaterally. Vascular: No carotid bruits. Neurological Exam: Mental status: alert and oriented to person, place, and time, recent and  remote memory intact, fund of knowledge intact, attention and concentration intact, speech fluent and not dysarthric, language intact. Cranial nerves: CN I: not tested CN II: pupils equal, round and reactive to light, visual fields intact CN III, IV, VI:  full range of motion, no nystagmus, no ptosis CN V: facial sensation intact CN VII: upper and lower face symmetric CN VIII: hearing intact CN IX, X: gag intact, uvula midline CN XI: sternocleidomastoid and trapezius muscles intact CN XII: tongue midline Bulk & Tone: normal, no fasciculations. Motor:  5/5 throughout *** Sensation:  [place] *** temperature *** and vibration sensation intact.  ***. Deep Tendon Reflexes:  2+ throughout, *** toes downgoing.  *** Finger to nose testing:  Without dysmetria.  *** Heel to shin:  Without dysmetria.  *** Gait:  Normal station and stride.  Able to turn and tandem walk. Romberg ***.  IMPRESSION: 1.  Small acute left brachium pontis infarct secondary to small vessel disease. 2.  Hyperlipidemia. 3.  Hypertension. 4.  Type 2 diabetes  mellitus. 5.  Cerebral meningioma, stable compared to 2015.  PLAN: 1.  Plavix 75mg  daily secondary stroke prevention 2.  Crestor 40 mg daily (LDL goal less than 70) 3.  Continued medical management for hypertension and diabetes.  Thank you for allowing me to take part in the care of this patient.  Metta Clines, DO  CC: ***

## 2017-12-24 ENCOUNTER — Encounter: Payer: Self-pay | Admitting: Neurology

## 2017-12-24 ENCOUNTER — Ambulatory Visit: Payer: PPO | Admitting: Neurology

## 2017-12-24 DIAGNOSIS — Z029 Encounter for administrative examinations, unspecified: Secondary | ICD-10-CM

## 2018-02-04 DIAGNOSIS — N401 Enlarged prostate with lower urinary tract symptoms: Secondary | ICD-10-CM | POA: Diagnosis not present

## 2018-02-04 DIAGNOSIS — N183 Chronic kidney disease, stage 3 (moderate): Secondary | ICD-10-CM | POA: Diagnosis not present

## 2018-02-04 DIAGNOSIS — Z7984 Long term (current) use of oral hypoglycemic drugs: Secondary | ICD-10-CM | POA: Diagnosis not present

## 2018-02-04 DIAGNOSIS — Z8673 Personal history of transient ischemic attack (TIA), and cerebral infarction without residual deficits: Secondary | ICD-10-CM | POA: Diagnosis not present

## 2018-02-04 DIAGNOSIS — E1122 Type 2 diabetes mellitus with diabetic chronic kidney disease: Secondary | ICD-10-CM | POA: Diagnosis not present

## 2018-02-04 DIAGNOSIS — E7800 Pure hypercholesterolemia, unspecified: Secondary | ICD-10-CM | POA: Diagnosis not present

## 2018-02-04 DIAGNOSIS — I10 Essential (primary) hypertension: Secondary | ICD-10-CM | POA: Diagnosis not present

## 2018-02-06 DIAGNOSIS — H401111 Primary open-angle glaucoma, right eye, mild stage: Secondary | ICD-10-CM | POA: Diagnosis not present

## 2018-02-06 DIAGNOSIS — H401122 Primary open-angle glaucoma, left eye, moderate stage: Secondary | ICD-10-CM | POA: Diagnosis not present

## 2018-02-06 DIAGNOSIS — S0500XA Injury of conjunctiva and corneal abrasion without foreign body, unspecified eye, initial encounter: Secondary | ICD-10-CM | POA: Diagnosis not present

## 2018-02-06 DIAGNOSIS — H353131 Nonexudative age-related macular degeneration, bilateral, early dry stage: Secondary | ICD-10-CM | POA: Diagnosis not present

## 2018-02-06 DIAGNOSIS — H04123 Dry eye syndrome of bilateral lacrimal glands: Secondary | ICD-10-CM | POA: Diagnosis not present

## 2018-02-06 DIAGNOSIS — Z961 Presence of intraocular lens: Secondary | ICD-10-CM | POA: Diagnosis not present

## 2018-03-20 NOTE — Progress Notes (Signed)
NEUROLOGY CONSULTATION NOTE  James Henderson MRN: [id number] DOB: August 04, 1950  Referring provider: Marilynne Drivers, PA Primary care provider: Marilynne Drivers, PA  Reason for consult:  stroke  HISTORY OF PRESENT ILLNESS: James Henderson is a 68 year old right-handed man with hypertension, hyperlipidemia, type 2 diabetes, cerebral meningioma and history of brainstem stroke who presents for stroke.  He is accompanied by his wife who supplements history.  History supplemented by hospital and referring provider notes.  James Henderson has a history of prior small midbrain infarct in 2015.  He was admitted to [facility] from 10/21/2017 to 10/24/2017 after presenting with dizziness and ataxia.  CT of the head and neck was personally reviewed and revealed diffuse intracranial atherosclerotic disease and mild atherosclerotic disease in both carotid arteries but no significant stenosis or large vessel occlusion.  MRI of the brain personally reviewed showed moderate atrophy with chronic small vessel ischemic changes as well as extensive chronic microhemorrhages as well as left lateral tentorium 8 x 31mm meningioma (stable since 2015) but no acute infarct.  He had a repeat MRI which was personally reviewed and then demonstrated acute 5 mm infarct in the left brachium pontis.  2D echocardiogram demonstrated ejection fraction 55 to 60% but was indeterminant for evaluation of LV diastolic function.  LDL was 82.  Hemoglobin A1c was 6.2.  He was previously on aspirin 81 mg daily prior to admission.  He was discharged on Plavix 75 mg daily (dual antiplatelet therapy was not initiated due to evidence of microhemorrhages) and Crestor 40 mg daily.  After discharge, he had physical therapy.5  He notes mild unsteadiness when walking, but overall improved.    PAST MEDICAL HISTORY: Past Medical History:  Diagnosis Date  . Brainstem stroke ([place]) 2015  . Cerebral meningioma ([place])   . Diabetes mellitus without complication ([place])    . Glaucoma   . Hyperlipidemia   . Hypertension     PAST SURGICAL HISTORY: Past Surgical History:  Procedure Laterality Date  . EYE SURGERY      MEDICATIONS: Current Outpatient Medications on File Prior to Visit  Medication Sig Dispense Refill  . acetaminophen (TYLENOL) 500 MG tablet Take 1,000 mg by mouth every 6 (six) hours as needed for headache (pain).    . brimonidine (ALPHAGAN) 0.2 % ophthalmic solution Place 1 drop into both eyes 3 (three) times daily.  4  . Brinzolamide-Brimonidine (SIMBRINZA) 1-0.2 % SUSP Place 1 drop into both eyes 2 (two) times daily.     . Cholecalciferol (VITAMIN D3) 400 units CAPS Take 400 Units by mouth every evening.    . clopidogrel (PLAVIX) 75 MG tablet Take 1 tablet (75 mg total) by mouth daily. 30 tablet 0  . dorzolamide (TRUSOPT) 2 % ophthalmic solution Place 1 drop into both eyes 2 (two) times daily.  4  . fluticasone (FLONASE) 50 MCG/ACT nasal spray Place 1 spray into both nostrils daily as needed for allergies or rhinitis.    Marland Kitchen glipiZIDE (GLUCOTROL) 5 MG tablet Take 5 mg by mouth daily.    . Inositol Niacinate (NIACIN FLUSH FREE) 500 MG CAPS Take 500 mg by mouth every evening.    . latanoprost (XALATAN) 0.005 % ophthalmic solution Place 1 drop into both eyes at bedtime.  1  . losartan (COZAAR) 100 MG tablet Take 100 mg by mouth daily.  1  . metFORMIN (GLUCOPHAGE) 1000 MG tablet Take 1,000 mg by mouth daily with supper.  1  . Omega-3 Fatty Acids (FISH OIL) 1000 MG  CAPS Take 1,000 mg by mouth every evening.    Vladimir Faster Glycol-Propyl Glycol (SYSTANE ULTRA) 0.4-0.3 % SOLN Place 1 drop into both eyes daily as needed (dryness or itching).    . rosuvastatin (CRESTOR) 40 MG tablet Take 1 tablet (40 mg total) by mouth daily at 6 PM. 30 tablet 0  . timolol (BETIMOL) 0.5 % ophthalmic solution Place 1 drop into both eyes daily.      No current facility-administered medications on file prior to visit.     ALLERGIES: No Known Allergies  FAMILY  HISTORY: Family History  Problem Relation Age of Onset  . Stroke Mother   . Stroke Brother   . Diabetes type II Brother    SOCIAL HISTORY: Social History   Socioeconomic History  . Marital status: Single    Spouse name: Not on file  . Number of children: 1  . Years of education: 34  . Highest education level: Not on file  Occupational History  . Not on file  Social Needs  . Financial resource strain: Not on file  . Food insecurity:    Worry: Not on file    Inability: Not on file  . Transportation needs:    Medical: Not on file    Non-medical: Not on file  Tobacco Use  . Smoking status: Former Research scientist (life sciences)  . Smokeless tobacco: Never Used  Substance and Sexual Activity  . Alcohol use: No    Alcohol/week: 0.0 standard drinks  . Drug use: No  . Sexual activity: Not on file  Lifestyle  . Physical activity:    Days per week: Not on file    Minutes per session: Not on file  . Stress: Not on file  Relationships  . Social connections:    Talks on phone: Not on file    Gets together: Not on file    Attends religious service: Not on file    Active member of club or organization: Not on file    Attends meetings of clubs or organizations: Not on file    Relationship status: Not on file  . Intimate partner violence:    Fear of current or ex partner: Not on file    Emotionally abused: Not on file    Physically abused: Not on file    Forced sexual activity: Not on file  Other Topics Concern  . Not on file  Social History Narrative   Patient is married with one biological child and one adopted.   Patient is right handed.   Patient has hs education.   Patient drinks caffeine rarely.    REVIEW OF SYSTEMS: Constitutional: No fevers, chills, or sweats, no generalized fatigue, change in appetite Eyes: No visual changes, double vision, eye pain Ear, nose and throat: No hearing loss, ear pain, nasal congestion, sore throat Cardiovascular: No chest pain, palpitations Respiratory:   No shortness of breath at rest or with exertion, wheezes GastrointestinaI: No nausea, vomiting, diarrhea, abdominal pain, fecal incontinence Genitourinary:  No dysuria, urinary retention or frequency Musculoskeletal:  No neck pain, back pain Integumentary: No rash, pruritus, skin lesions Neurological: as above Psychiatric: No depression, insomnia, anxiety Endocrine: No palpitations, fatigue, diaphoresis, mood swings, change in appetite, change in weight, increased thirst Hematologic/Lymphatic:  No purpura, petechiae. Allergic/Immunologic: no itchy/runny eyes, nasal congestion, recent allergic reactions, rashes  PHYSICAL EXAM: Blood pressure 122/72, pulse 69, height 5\' 5"  (1.651 m), weight 169 lb (76.7 kg), SpO2 98 %. General: No acute distress.  Patient appears well-groomed.   Head:  Normocephalic/atraumatic Eyes:  fundi examined but not visualized Neck: supple, no paraspinal tenderness, full range of motion Back: No paraspinal tenderness Heart: regular rate and rhythm Lungs: Clear to auscultation bilaterally. Vascular: No carotid bruits. Neurological Exam: Mental status: alert and oriented to person, place, and time, recent and remote memory intact, fund of knowledge intact, attention and concentration intact, speech fluent and not dysarthric, language intact. Cranial nerves: CN I: not tested CN II: pupils equal, round and reactive to light, visual fields intact CN III, IV, VI:  full range of motion, saccadic eye movements when tracking, no nystagmus, no ptosis CN V: facial sensation intact CN VII: upper and lower face symmetric CN VIII: hearing intact CN IX, X: gag intact, uvula midline CN XI: sternocleidomastoid and trapezius muscles intact CN XII: tongue midline Bulk & Tone: normal, no fasciculations. Motor:  5/5 throughout  Sensation:  temperature and vibration sensation intact. Deep Tendon Reflexes:  2+ throughout, toes downgoing.   Finger to nose testing:  Without  dysmetria.   Heel to shin:  Without dysmetria.   Gait:  Slightly wide-based and unsteady.  Able to turn, unable to tandem walk. Romberg negative.  IMPRESSION: 1.  Left brachium pontis infarct secondary to small vessel disease 2.  Hypertension 3.  Hyperlipidemia 4.  Type 2 diabetes mellitus 5.  Cerebral meningioma, stable 6.  Cerebral microhemorrhages, likely secondary to hypertension  PLAN: 1.  Continue Plavix 75 mg daily for secondary stroke prevention 2.  Continue Crestor 40 mg daily.  LDL goal less than 70. 3.  Continue blood pressure and glycemic control. 4.  Mediterranean diet 5.  Routine exercise 6.  Follow-up in 6 months.  Thank you for allowing me to take part in the care of this patient.  Metta Clines, DO  CC: Marilynne Drivers, PA

## 2018-03-24 ENCOUNTER — Encounter: Payer: Self-pay | Admitting: Neurology

## 2018-03-24 ENCOUNTER — Ambulatory Visit: Payer: HMO | Admitting: Neurology

## 2018-03-24 VITALS — BP 122/72 | HR 69 | Ht 65.0 in | Wt 169.0 lb

## 2018-03-24 DIAGNOSIS — I10 Essential (primary) hypertension: Secondary | ICD-10-CM | POA: Diagnosis not present

## 2018-03-24 DIAGNOSIS — I6389 Other cerebral infarction: Secondary | ICD-10-CM | POA: Diagnosis not present

## 2018-03-24 DIAGNOSIS — D320 Benign neoplasm of cerebral meninges: Secondary | ICD-10-CM

## 2018-03-24 DIAGNOSIS — E119 Type 2 diabetes mellitus without complications: Secondary | ICD-10-CM | POA: Diagnosis not present

## 2018-03-24 DIAGNOSIS — E785 Hyperlipidemia, unspecified: Secondary | ICD-10-CM

## 2018-03-24 NOTE — Patient Instructions (Signed)
1.  Continue Plavix 75mg  daily  2.  Continue Crestor 3.  Continue blood pressure and diabetes medicine 4.  Continue walks 5.  Mediterranean diet (see below) 6.  Follow up in 6 months.   Mediterranean Diet A Mediterranean diet refers to food and lifestyle choices that are based on the traditions of countries located on the [employer]. This way of eating has been shown to help prevent certain conditions and improve outcomes for people who have chronic diseases, like kidney disease and heart disease. What are tips for following this plan? Lifestyle  Cook and eat meals together with your family, when possible.  Drink enough fluid to keep your urine clear or pale yellow.  Be physically active every day. This includes: ? Aerobic exercise like running or swimming. ? Leisure activities like gardening, walking, or housework.  Get 7-8 hours of sleep each night.  If recommended by your health care provider, drink red wine in moderation. This means 1 glass a day for nonpregnant women and 2 glasses a day for men. A glass of wine equals 5 oz (150 mL). Reading food labels   Check the serving size of packaged foods. For foods such as rice and pasta, the serving size refers to the amount of cooked product, not dry.  Check the total fat in packaged foods. Avoid foods that have saturated fat or trans fats.  Check the ingredients list for added sugars, such as corn syrup. Shopping  At the grocery store, buy most of your food from the areas near the walls of the store. This includes: ? Fresh fruits and vegetables (produce). ? Grains, beans, nuts, and seeds. Some of these may be available in unpackaged forms or large amounts (in bulk). ? Fresh seafood. ? Poultry and eggs. ? Low-fat dairy products.  Buy whole ingredients instead of prepackaged foods.  Buy fresh fruits and vegetables in-season from local farmers markets.  Buy frozen fruits and vegetables in resealable bags.  If you do  not have access to quality fresh seafood, buy precooked frozen shrimp or canned fish, such as tuna, salmon, or sardines.  Buy small amounts of raw or cooked vegetables, salads, or olives from the deli or salad bar at your store.  Stock your pantry so you always have certain foods on hand, such as olive oil, canned tuna, canned tomatoes, rice, pasta, and beans. Cooking  Cook foods with extra-virgin olive oil instead of using butter or other vegetable oils.  Have meat as a side dish, and have vegetables or grains as your main dish. This means having meat in small portions or adding small amounts of meat to foods like pasta or stew.  Use beans or vegetables instead of meat in common dishes like chili or lasagna.  Experiment with different cooking methods. Try roasting or broiling vegetables instead of steaming or sauteing them.  Add frozen vegetables to soups, stews, pasta, or rice.  Add nuts or seeds for added healthy fat at each meal. You can add these to yogurt, salads, or vegetable dishes.  Marinate fish or vegetables using olive oil, lemon juice, garlic, and fresh herbs. Meal planning   Plan to eat 1 vegetarian meal one day each week. Try to work up to 2 vegetarian meals, if possible.  Eat seafood 2 or more times a week.  Have healthy snacks readily available, such as: ? Vegetable sticks with hummus. ? [place] yogurt. ? Fruit and nut trail mix.  Eat balanced meals throughout the week. This includes: ?  Fruit: 2-3 servings a day ? Vegetables: 4-5 servings a day ? Low-fat dairy: 2 servings a day ? Fish, poultry, or lean meat: 1 serving a day ? Beans and legumes: 2 or more servings a week ? Nuts and seeds: 1-2 servings a day ? Whole grains: 6-8 servings a day ? Extra-virgin olive oil: 3-4 servings a day  Limit red meat and sweets to only a few servings a month What are my food choices?  Mediterranean diet ? Recommended ? Grains: Whole-grain pasta. Brown rice. Bulgar wheat.  Polenta. Couscous. Whole-wheat bread. [place]. ? Vegetables: Artichokes. Beets. Broccoli. Cabbage. Carrots. Eggplant. Green beans. Chard. Kale. Spinach. Onions. Leeks. Peas. Squash. Tomatoes. Peppers. Radishes. ? Fruits: Apples. Apricots. Avocado. Berries. Bananas. Cherries. Dates. Figs. Grapes. Lemons. Melon. Oranges. Peaches. Plums. Pomegranate. ? Meats and other protein foods: Beans. Almonds. Sunflower seeds. Pine nuts. Peanuts. [place]. Salmon. Scallops. Shrimp. [place]. Tilapia. Clams. Oysters. Eggs. ? Dairy: Low-fat milk. Cheese. Greek yogurt. ? Beverages: Water. Red wine. Herbal tea. ? Fats and oils: Extra virgin olive oil. Avocado oil. Grape seed oil. ? Sweets and desserts: [place] yogurt with honey. Baked apples. Poached pears. Trail mix. ? Seasoning and other foods: Basil. Cilantro. Coriander. Cumin. Mint. Parsley. Sage. Rosemary. Tarragon. Garlic. Oregano. Thyme. Pepper. Balsalmic vinegar. Tahini. Hummus. Tomato sauce. Olives. Mushrooms. ? Limit these ? Grains: Prepackaged pasta or rice dishes. Prepackaged cereal with added sugar. ? Vegetables: Deep fried potatoes (french fries). ? Fruits: Fruit canned in syrup. ? Meats and other protein foods: Beef. Pork. Lamb. Poultry with skin. Hot dogs. [place] Salines. ? Dairy: Ice cream. Sour cream. Whole milk. ? Beverages: Juice. Sugar-sweetened soft drinks. Beer. Liquor and spirits. ? Fats and oils: Butter. Canola oil. Vegetable oil. Beef fat (tallow). Lard. ? Sweets and desserts: Cookies. Cakes. Pies. Candy. ? Seasoning and other foods: Mayonnaise. Premade sauces and marinades. ? The items listed may not be a complete list. Talk with your dietitian about what dietary choices are right for you. Summary  The Mediterranean diet includes both food and lifestyle choices.  Eat a variety of fresh fruits and vegetables, beans, nuts, seeds, and whole grains.  Limit the amount of red meat and sweets that you eat.  Talk with your health care provider about  whether it is safe for you to drink red wine in moderation. This means 1 glass a day for nonpregnant women and 2 glasses a day for men. A glass of wine equals 5 oz (150 mL). This information is not intended to replace advice given to you by your health care provider. Make sure you discuss any questions you have with your health care provider. Document Released: 09/29/2015 Document Revised: 11/01/2015 Document Reviewed: 09/29/2015 Elsevier Interactive Patient Education  2019 [employer].

## 2018-04-17 DIAGNOSIS — E119 Type 2 diabetes mellitus without complications: Secondary | ICD-10-CM | POA: Diagnosis not present

## 2018-04-17 DIAGNOSIS — N182 Chronic kidney disease, stage 2 (mild): Secondary | ICD-10-CM | POA: Diagnosis not present

## 2018-04-17 DIAGNOSIS — N184 Chronic kidney disease, stage 4 (severe): Secondary | ICD-10-CM | POA: Diagnosis not present

## 2018-04-17 DIAGNOSIS — E1122 Type 2 diabetes mellitus with diabetic chronic kidney disease: Secondary | ICD-10-CM | POA: Diagnosis not present

## 2018-04-17 DIAGNOSIS — I639 Cerebral infarction, unspecified: Secondary | ICD-10-CM | POA: Diagnosis not present

## 2018-04-17 DIAGNOSIS — N401 Enlarged prostate with lower urinary tract symptoms: Secondary | ICD-10-CM | POA: Diagnosis not present

## 2018-04-17 DIAGNOSIS — N183 Chronic kidney disease, stage 3 (moderate): Secondary | ICD-10-CM | POA: Diagnosis not present

## 2018-04-17 DIAGNOSIS — I10 Essential (primary) hypertension: Secondary | ICD-10-CM | POA: Diagnosis not present

## 2018-04-25 DIAGNOSIS — N184 Chronic kidney disease, stage 4 (severe): Secondary | ICD-10-CM | POA: Diagnosis not present

## 2018-04-25 DIAGNOSIS — N401 Enlarged prostate with lower urinary tract symptoms: Secondary | ICD-10-CM | POA: Diagnosis not present

## 2018-04-25 DIAGNOSIS — E119 Type 2 diabetes mellitus without complications: Secondary | ICD-10-CM | POA: Diagnosis not present

## 2018-04-25 DIAGNOSIS — N183 Chronic kidney disease, stage 3 (moderate): Secondary | ICD-10-CM | POA: Diagnosis not present

## 2018-04-25 DIAGNOSIS — I639 Cerebral infarction, unspecified: Secondary | ICD-10-CM | POA: Diagnosis not present

## 2018-04-25 DIAGNOSIS — E1122 Type 2 diabetes mellitus with diabetic chronic kidney disease: Secondary | ICD-10-CM | POA: Diagnosis not present

## 2018-04-25 DIAGNOSIS — N182 Chronic kidney disease, stage 2 (mild): Secondary | ICD-10-CM | POA: Diagnosis not present

## 2018-04-25 DIAGNOSIS — I10 Essential (primary) hypertension: Secondary | ICD-10-CM | POA: Diagnosis not present

## 2018-05-02 ENCOUNTER — Other Ambulatory Visit: Payer: Self-pay | Admitting: *Deleted

## 2018-05-02 NOTE — Patient Outreach (Signed)
  [facility]) Care Management Chronic Special Needs Program  05/02/2018  Name: James Henderson DOB: 1950-05-18  MRN: [id number]  Mr. James Henderson is enrolled in a chronic special needs plan for  Diabetes. A completed health risk assessment has not been received from the client and client has not responded to outreach attempts by their health care concierge.  The client's individualized care plan was developed based on available data.  Plan:  . Send unsuccessful outreach letter with a copy of individualized care plan to client . Send Neurosurgeon on preventing another stroke, self management of HTN and Iron deficiency anemia . Send Emergency planning/management officer . Send individualized care plan to provider  Chronic care management coordinator, Thea Silversmith,  will attempt outreach in 2-4 months.   Barrington Ellison RN,CCM,CDE [place] Management Coordinator Office Phone [phone] Office Fax [phone]

## 2018-06-10 DIAGNOSIS — I10 Essential (primary) hypertension: Secondary | ICD-10-CM | POA: Diagnosis not present

## 2018-06-10 DIAGNOSIS — N182 Chronic kidney disease, stage 2 (mild): Secondary | ICD-10-CM | POA: Diagnosis not present

## 2018-06-10 DIAGNOSIS — E1122 Type 2 diabetes mellitus with diabetic chronic kidney disease: Secondary | ICD-10-CM | POA: Diagnosis not present

## 2018-06-10 DIAGNOSIS — E7800 Pure hypercholesterolemia, unspecified: Secondary | ICD-10-CM | POA: Diagnosis not present

## 2018-06-10 DIAGNOSIS — E119 Type 2 diabetes mellitus without complications: Secondary | ICD-10-CM | POA: Diagnosis not present

## 2018-06-10 DIAGNOSIS — N184 Chronic kidney disease, stage 4 (severe): Secondary | ICD-10-CM | POA: Diagnosis not present

## 2018-06-10 DIAGNOSIS — N183 Chronic kidney disease, stage 3 (moderate): Secondary | ICD-10-CM | POA: Diagnosis not present

## 2018-06-10 DIAGNOSIS — I639 Cerebral infarction, unspecified: Secondary | ICD-10-CM | POA: Diagnosis not present

## 2018-06-10 DIAGNOSIS — N401 Enlarged prostate with lower urinary tract symptoms: Secondary | ICD-10-CM | POA: Diagnosis not present

## 2018-06-23 ENCOUNTER — Other Ambulatory Visit: Payer: Self-pay

## 2018-06-23 NOTE — Patient Outreach (Signed)
  [facility]) Care Management Chronic Special Needs Program    06/23/2018  Name: [name], DOB: Apr 06, 1950  MRN: [id number]   [name] is enrolled in a chronic special needs plan for Diabetes. RNCM called to follow up and review individualized care plan. RNCM unable to complete call as he stated he was driving and states he will call RNCM back.  Plan: await return call. RNCM will follow up within the month, if no return call.  Thea Silversmith, RN, MSN, [place] [phone]

## 2018-07-16 DIAGNOSIS — I10 Essential (primary) hypertension: Secondary | ICD-10-CM | POA: Diagnosis not present

## 2018-07-16 DIAGNOSIS — E1122 Type 2 diabetes mellitus with diabetic chronic kidney disease: Secondary | ICD-10-CM | POA: Diagnosis not present

## 2018-07-16 DIAGNOSIS — I639 Cerebral infarction, unspecified: Secondary | ICD-10-CM | POA: Diagnosis not present

## 2018-07-16 DIAGNOSIS — N182 Chronic kidney disease, stage 2 (mild): Secondary | ICD-10-CM | POA: Diagnosis not present

## 2018-07-16 DIAGNOSIS — N183 Chronic kidney disease, stage 3 (moderate): Secondary | ICD-10-CM | POA: Diagnosis not present

## 2018-07-16 DIAGNOSIS — E7800 Pure hypercholesterolemia, unspecified: Secondary | ICD-10-CM | POA: Diagnosis not present

## 2018-07-16 DIAGNOSIS — N401 Enlarged prostate with lower urinary tract symptoms: Secondary | ICD-10-CM | POA: Diagnosis not present

## 2018-07-16 DIAGNOSIS — E119 Type 2 diabetes mellitus without complications: Secondary | ICD-10-CM | POA: Diagnosis not present

## 2018-07-16 DIAGNOSIS — N184 Chronic kidney disease, stage 4 (severe): Secondary | ICD-10-CM | POA: Diagnosis not present

## 2018-07-24 ENCOUNTER — Ambulatory Visit: Payer: Self-pay

## 2018-08-01 ENCOUNTER — Other Ambulatory Visit: Payer: Self-pay

## 2018-08-01 NOTE — Patient Outreach (Signed)
  [place] [facility]) Care Management Chronic Special Needs Program    08/01/2018  Name: [name], DOB: 02-24-1950  MRN: [id number]   Mr. James Henderson is enrolled in a chronic special needs plan. RNCM called to follow up. Client reports this is not a good time to talk and request a call back next week. He states, "I am doing good". RNCM reinforced the 24 hour nurse advice line availability and confirmed client has RNCM's contact number.  Plan: telephonic outreach next week.  Thea Silversmith, RN, MSN, [place] [phone]

## 2018-08-04 ENCOUNTER — Ambulatory Visit: Payer: Self-pay

## 2018-08-05 ENCOUNTER — Ambulatory Visit: Payer: Self-pay

## 2018-08-06 ENCOUNTER — Other Ambulatory Visit: Payer: Self-pay

## 2018-08-06 NOTE — Patient Outreach (Signed)
  [place] [facility]) Care Management Chronic Special Needs Program    08/06/2018  Name: James Henderson, DOB: 02/18/1951  MRN: [id number]   Mr. James Henderson is enrolled in a chronic special needs plan. RNCM called to follow up and review individualized care plan. No answer. Unable to leave voice message. 3rd outreach attempt.  Plan: send unsuccessful outreach letter and call at next scheduled outreach time.  Thea Silversmith, RN, MSN, [place] [phone]

## 2018-08-19 DIAGNOSIS — I639 Cerebral infarction, unspecified: Secondary | ICD-10-CM | POA: Diagnosis not present

## 2018-08-19 DIAGNOSIS — E7800 Pure hypercholesterolemia, unspecified: Secondary | ICD-10-CM | POA: Diagnosis not present

## 2018-08-19 DIAGNOSIS — E1122 Type 2 diabetes mellitus with diabetic chronic kidney disease: Secondary | ICD-10-CM | POA: Diagnosis not present

## 2018-08-19 DIAGNOSIS — N183 Chronic kidney disease, stage 3 (moderate): Secondary | ICD-10-CM | POA: Diagnosis not present

## 2018-08-19 DIAGNOSIS — N401 Enlarged prostate with lower urinary tract symptoms: Secondary | ICD-10-CM | POA: Diagnosis not present

## 2018-08-19 DIAGNOSIS — I10 Essential (primary) hypertension: Secondary | ICD-10-CM | POA: Diagnosis not present

## 2018-08-19 DIAGNOSIS — E119 Type 2 diabetes mellitus without complications: Secondary | ICD-10-CM | POA: Diagnosis not present

## 2018-08-19 DIAGNOSIS — N182 Chronic kidney disease, stage 2 (mild): Secondary | ICD-10-CM | POA: Diagnosis not present

## 2018-08-19 DIAGNOSIS — N184 Chronic kidney disease, stage 4 (severe): Secondary | ICD-10-CM | POA: Diagnosis not present

## 2018-09-15 ENCOUNTER — Encounter: Payer: Self-pay | Admitting: Neurology

## 2018-09-22 ENCOUNTER — Ambulatory Visit: Payer: HMO | Admitting: Neurology

## 2018-10-08 DIAGNOSIS — E1122 Type 2 diabetes mellitus with diabetic chronic kidney disease: Secondary | ICD-10-CM | POA: Diagnosis not present

## 2018-10-08 DIAGNOSIS — E119 Type 2 diabetes mellitus without complications: Secondary | ICD-10-CM | POA: Diagnosis not present

## 2018-10-08 DIAGNOSIS — N182 Chronic kidney disease, stage 2 (mild): Secondary | ICD-10-CM | POA: Diagnosis not present

## 2018-10-08 DIAGNOSIS — I10 Essential (primary) hypertension: Secondary | ICD-10-CM | POA: Diagnosis not present

## 2018-10-08 DIAGNOSIS — N183 Chronic kidney disease, stage 3 (moderate): Secondary | ICD-10-CM | POA: Diagnosis not present

## 2018-10-08 DIAGNOSIS — N401 Enlarged prostate with lower urinary tract symptoms: Secondary | ICD-10-CM | POA: Diagnosis not present

## 2018-10-08 DIAGNOSIS — E7800 Pure hypercholesterolemia, unspecified: Secondary | ICD-10-CM | POA: Diagnosis not present

## 2018-10-08 DIAGNOSIS — N184 Chronic kidney disease, stage 4 (severe): Secondary | ICD-10-CM | POA: Diagnosis not present

## 2018-10-08 DIAGNOSIS — I639 Cerebral infarction, unspecified: Secondary | ICD-10-CM | POA: Diagnosis not present

## 2018-11-27 ENCOUNTER — Other Ambulatory Visit: Payer: Self-pay

## 2018-11-27 ENCOUNTER — Ambulatory Visit: Payer: Self-pay

## 2018-11-27 NOTE — Patient Outreach (Signed)
  [facility]) Care Management Chronic Special Needs Program  11/27/2018  Name: James Henderson DOB: 1950-08-04  MRN: [id number]  James Henderson is enrolled in a chronic special needs plan for Diabetes.   RNCM spoke with client who declines to provide patient identifiers. He request to call RNCM back when he has someone with him. RNCM provided contact number and will await return call.   Goals Addressed   None     Plan: RNCM will outreach within 1-2 weeks if no return call.  Thea Silversmith, RN, MSN, [place] [phone]

## 2018-12-02 ENCOUNTER — Other Ambulatory Visit: Payer: Self-pay

## 2018-12-02 DIAGNOSIS — Z23 Encounter for immunization: Secondary | ICD-10-CM | POA: Diagnosis not present

## 2018-12-02 NOTE — Patient Outreach (Signed)
  [place] [facility]) Care Management Chronic Special Needs Program    12/02/2018  Name: James Henderson, DOB: [birth year]  MRN: [id number]   Mr. James Henderson is enrolled in a chronic special needs plan. RNCM called to follow up and review individualized care plan. No answer. Unable to leave message. 2nd attempt.  Plan: RNCM will make telephone outreach within 1-2 weeks.  Thea Silversmith, RN, MSN, [place] [phone]

## 2018-12-04 DIAGNOSIS — N183 Chronic kidney disease, stage 3 unspecified: Secondary | ICD-10-CM | POA: Diagnosis not present

## 2018-12-04 DIAGNOSIS — E7800 Pure hypercholesterolemia, unspecified: Secondary | ICD-10-CM | POA: Diagnosis not present

## 2018-12-04 DIAGNOSIS — I639 Cerebral infarction, unspecified: Secondary | ICD-10-CM | POA: Diagnosis not present

## 2018-12-04 DIAGNOSIS — E119 Type 2 diabetes mellitus without complications: Secondary | ICD-10-CM | POA: Diagnosis not present

## 2018-12-04 DIAGNOSIS — N401 Enlarged prostate with lower urinary tract symptoms: Secondary | ICD-10-CM | POA: Diagnosis not present

## 2018-12-04 DIAGNOSIS — N182 Chronic kidney disease, stage 2 (mild): Secondary | ICD-10-CM | POA: Diagnosis not present

## 2018-12-04 DIAGNOSIS — E1122 Type 2 diabetes mellitus with diabetic chronic kidney disease: Secondary | ICD-10-CM | POA: Diagnosis not present

## 2018-12-04 DIAGNOSIS — I10 Essential (primary) hypertension: Secondary | ICD-10-CM | POA: Diagnosis not present

## 2018-12-04 DIAGNOSIS — N184 Chronic kidney disease, stage 4 (severe): Secondary | ICD-10-CM | POA: Diagnosis not present

## 2018-12-05 ENCOUNTER — Other Ambulatory Visit: Payer: Self-pay

## 2018-12-05 NOTE — Patient Outreach (Signed)
  [facility]) Care Management Chronic Special Needs Program  12/05/2018  Name: James Henderson DOB: 08/31/1950  MRN: [id number]  James Henderson is enrolled in a chronic special needs plan for Diabetes. RNCM called to follow up and review individualized care plan. No answer. Unable to leave message. 3rd attempt. Per policy/procedure, RNCM reviewed and updated care plan.   Goals Addressed            This Visit's Progress   . Client understands the importance of follow-up with providers by attending scheduled visits   On track    On-going    . Client will not report change from baseline and no repeated symptoms of stroke with in the next 9 months    On track   . Client will report no fall or injuries in the next 9 months.   On track   . Client will verbalize knowledge of self management of Hypertension as evidences by BP reading of 140/90 or less; or as defined by provider   On track   . Maintain timely refills of diabetic medication as prescribed within the year .   On track   . Obtain annual  Lipid Profile, LDL-C   On track    Last noted 02/04/18    . Obtain Annual Eye (retinal)  Exam    On track   . Obtain Annual Foot Exam   On track   . Obtain annual screen for micro albuminuria (urine) , nephropathy (kidney problems)   On track   . Obtain Hemoglobin A1C at least 2 times per year   On track    Done 02/04/18    . COMPLETED: Visit Primary Care Provider or Endocrinologist at least 2 times per year        Seen at least two times/year.       Plan: send updated care plan to client; send updated care plan to primary care. follow up with client within the next 4-5 months.      Thea Silversmith, RN, MSN, [place] [phone]

## 2019-03-06 DIAGNOSIS — N401 Enlarged prostate with lower urinary tract symptoms: Secondary | ICD-10-CM | POA: Diagnosis not present

## 2019-03-06 DIAGNOSIS — I639 Cerebral infarction, unspecified: Secondary | ICD-10-CM | POA: Diagnosis not present

## 2019-03-06 DIAGNOSIS — N184 Chronic kidney disease, stage 4 (severe): Secondary | ICD-10-CM | POA: Diagnosis not present

## 2019-03-06 DIAGNOSIS — I10 Essential (primary) hypertension: Secondary | ICD-10-CM | POA: Diagnosis not present

## 2019-03-06 DIAGNOSIS — N182 Chronic kidney disease, stage 2 (mild): Secondary | ICD-10-CM | POA: Diagnosis not present

## 2019-03-06 DIAGNOSIS — E119 Type 2 diabetes mellitus without complications: Secondary | ICD-10-CM | POA: Diagnosis not present

## 2019-03-06 DIAGNOSIS — E1122 Type 2 diabetes mellitus with diabetic chronic kidney disease: Secondary | ICD-10-CM | POA: Diagnosis not present

## 2019-03-06 DIAGNOSIS — E7800 Pure hypercholesterolemia, unspecified: Secondary | ICD-10-CM | POA: Diagnosis not present

## 2019-05-05 ENCOUNTER — Other Ambulatory Visit: Payer: Self-pay

## 2019-05-05 NOTE — Patient Outreach (Signed)
  [place] [facility]) Care Management Chronic Special Needs Program    05/05/2019  Name: James Henderson, DOB: 1950/07/11  MRN: [id number]   Mr. James Henderson is enrolled in a chronic special needs plan for Diabetes. RNCM called to assist client with completion of health risk assessment and to update individualized care plan. No answer. Unable to leave message. HIPAA compliant message left.   Plan: Chronic care management coordinator will attempt outreach within 1-2 weeks.  Thea Silversmith, RN, MSN, [place] [phone]

## 2019-05-08 ENCOUNTER — Other Ambulatory Visit: Payer: Self-pay

## 2019-05-08 NOTE — Patient Outreach (Signed)
  [place] [facility]) Care Management Chronic Special Needs Program    05/08/2019  Name: James Henderson, DOB: 1950-03-31  MRN: [id number]   Mr. James Henderson is enrolled in a chronic special needs plan for Diabetes. RNCM called to assist client with completion of Health risk assessment and update care plan. No answer. Unable to reach. Unable to leave message.  Plan: RNCM will follow up outreach within the next 1-2 weeks.   Thea Silversmith, RN, MSN, [place] [phone]

## 2019-05-11 ENCOUNTER — Other Ambulatory Visit: Payer: Self-pay

## 2019-05-11 NOTE — Patient Outreach (Signed)
[facility]) Care Management Chronic Special Needs Program  05/11/2019  Name: Kongmeng Santoro DOB: 05/15/1950  MRN: [id number]  Mr. Dennis Rudin is enrolled in a chronic special needs plan for  Diabetes. A completed health risk assessment has not been received from the client and client has not responded to outreach attempts.  The client's individualized care plan was developed based on available data.   Goals Addressed            This Visit's Progress   . Client understands the importance of follow-up with providers by attending scheduled visits   On track    RNCM has not spoken with client- renew goal 2021.  It is important for you to follow up with your providers as scheduled for recommended labs, procedure and medication refills. Please call your doctors if you do not have follow up appointments scheduled.    . Client will not report change from baseline and no repeated symptoms of stroke with in the next 9 months    On track    Goal renew - 2021 Continue to take your medications as prescribed. Follow up with your providers as scheduled for recommended labs, procedures and medication refills. Mailed education:"preventing a second stroke: know the signs of stroke.  Stroke Symptoms-Spot a stroke F.A.S.T. FACE DROOPING Does one side of the face droop or is it numb? Ask the person to smile. ARM WEAKNESS Is one arm weak or numb? Ask the person to raise both arms. Does one arm drift downward? SPEECH DIFFICULTY Is speech slurred, are they unable to speak, or are they hard to understand? Ask the person to repeat a simple sentence, like "the sky is blue." Is the sentence repeated correctly? TIME TO CALL 9-1-1 If the person shows any of these symptoms, even if the symptoms go away, call 9-1-1 and get them to the hospital immediately.      . Client will report no fall or injuries in the next 9-12 months.   On track    Unable to reach client. Unable to address per  chart review. No ED visits for fall noted.  Mailed education: "preventing falls". Please review and call your Care management coordinator [phone]) if you have any questions.     . Client will verbalize knowledge of self management of Hypertension as evidences by BP reading of 140/90 or less; or as defined by provider   On track    Unable to reach client. No noted per chart. Goal continued 2021. Follow up with your doctor as scheduled. Take your medications as prescribed by your doctor. Ask your doctor "what is my target blood pressure range". Monitor your blood pressure and take results to your doctor's appointment.  Monitor the amount of salt you are eating. Continue to exercise as tolerated and remain active. Eat heart healthy diet (full of fruits, vegetables, whole grains, lean protein, water--limit salt, fat, and sugar/simple carb intake). Mailed education: "DASH diet": please review and call if you have any questions.    Marland Kitchen HEMOGLOBIN A1C < 7.0       Not noted in chart review.   Goal renewed 2021: Diabetes self management actions:  Glucose monitoring per provider recommendations  Eat Healthy  Visit provider every 3-6 months as directed  Hbg A1C level every 3-6 months. Ask your doctor, "what is my Target A1C goal?" Ask your doctor, what is my Target blood sugar range?"    . Maintain timely refills of diabetic medication as prescribed within the year .  On track    Goal renewed 2021 Please call your doctor and/or your care management coordinator [phone]), if you have any difficulty obtaining medications.    . Obtain annual  Lipid Profile, LDL-C   On track    Not noted in chart  Goal renewed 2021: It is important to follow up with your provider for recommended test.  Your goal for LDL is less than 70 mg/dl, if you are at high risk for complication, try to avoid saturated fats, trans-fats, and eat more fiber.    . Obtain Annual Eye (retinal)  Exam    On track      Unable to reach client. Not noted in chart:  Goal renewed 2021: It is important for you to follow up with your provider for recommended annual exam. Please call to schedule if you have not had your yearly eye exam or do not have one scheduled.    . Obtain Annual Foot Exam   On track    Data not noted in chart:   Goal renewed 2021: It is important for you to follow up with your provider for annual exam.    . Obtain annual screen for micro albuminuria (urine) , nephropathy (kidney problems)   On track    Not noted in chart  Goal renewed 2021: Please follow up with your provider as scheduled for recommended test, procedures.  This test looks at how your kidneys are working.    . Obtain Hemoglobin A1C at least 2 times per year   On track    Not noted in chart.  Goal renewed 2021. It is important to follow up with your provider for recommended labs.      Plan:  . Send unsuccessful outreach letter with a copy of individualized care plan to client . Send individualized care plan to provider . Send Neurosurgeon . RNCM will outreach to client based upon tier level within 9-12 months or sooner as indicated.  Thea Silversmith, RN, MSN, [place] [phone]

## 2019-06-03 DIAGNOSIS — E119 Type 2 diabetes mellitus without complications: Secondary | ICD-10-CM | POA: Diagnosis not present

## 2019-06-03 DIAGNOSIS — N183 Chronic kidney disease, stage 3 unspecified: Secondary | ICD-10-CM | POA: Diagnosis not present

## 2019-06-03 DIAGNOSIS — N182 Chronic kidney disease, stage 2 (mild): Secondary | ICD-10-CM | POA: Diagnosis not present

## 2019-06-03 DIAGNOSIS — I10 Essential (primary) hypertension: Secondary | ICD-10-CM | POA: Diagnosis not present

## 2019-06-03 DIAGNOSIS — N401 Enlarged prostate with lower urinary tract symptoms: Secondary | ICD-10-CM | POA: Diagnosis not present

## 2019-06-03 DIAGNOSIS — N184 Chronic kidney disease, stage 4 (severe): Secondary | ICD-10-CM | POA: Diagnosis not present

## 2019-06-03 DIAGNOSIS — I639 Cerebral infarction, unspecified: Secondary | ICD-10-CM | POA: Diagnosis not present

## 2019-06-03 DIAGNOSIS — E1122 Type 2 diabetes mellitus with diabetic chronic kidney disease: Secondary | ICD-10-CM | POA: Diagnosis not present

## 2019-06-03 DIAGNOSIS — E7800 Pure hypercholesterolemia, unspecified: Secondary | ICD-10-CM | POA: Diagnosis not present

## 2019-07-31 DIAGNOSIS — Z8673 Personal history of transient ischemic attack (TIA), and cerebral infarction without residual deficits: Secondary | ICD-10-CM | POA: Diagnosis not present

## 2019-07-31 DIAGNOSIS — Z125 Encounter for screening for malignant neoplasm of prostate: Secondary | ICD-10-CM | POA: Diagnosis not present

## 2019-07-31 DIAGNOSIS — E7800 Pure hypercholesterolemia, unspecified: Secondary | ICD-10-CM | POA: Diagnosis not present

## 2019-07-31 DIAGNOSIS — N401 Enlarged prostate with lower urinary tract symptoms: Secondary | ICD-10-CM | POA: Diagnosis not present

## 2019-07-31 DIAGNOSIS — E1122 Type 2 diabetes mellitus with diabetic chronic kidney disease: Secondary | ICD-10-CM | POA: Diagnosis not present

## 2019-07-31 DIAGNOSIS — N182 Chronic kidney disease, stage 2 (mild): Secondary | ICD-10-CM | POA: Diagnosis not present

## 2019-07-31 DIAGNOSIS — I10 Essential (primary) hypertension: Secondary | ICD-10-CM | POA: Diagnosis not present

## 2019-08-10 DIAGNOSIS — N401 Enlarged prostate with lower urinary tract symptoms: Secondary | ICD-10-CM | POA: Diagnosis not present

## 2019-08-10 DIAGNOSIS — I639 Cerebral infarction, unspecified: Secondary | ICD-10-CM | POA: Diagnosis not present

## 2019-08-10 DIAGNOSIS — I10 Essential (primary) hypertension: Secondary | ICD-10-CM | POA: Diagnosis not present

## 2019-08-10 DIAGNOSIS — N184 Chronic kidney disease, stage 4 (severe): Secondary | ICD-10-CM | POA: Diagnosis not present

## 2019-08-10 DIAGNOSIS — N182 Chronic kidney disease, stage 2 (mild): Secondary | ICD-10-CM | POA: Diagnosis not present

## 2019-08-10 DIAGNOSIS — E1122 Type 2 diabetes mellitus with diabetic chronic kidney disease: Secondary | ICD-10-CM | POA: Diagnosis not present

## 2019-08-10 DIAGNOSIS — N183 Chronic kidney disease, stage 3 unspecified: Secondary | ICD-10-CM | POA: Diagnosis not present

## 2019-08-10 DIAGNOSIS — E7800 Pure hypercholesterolemia, unspecified: Secondary | ICD-10-CM | POA: Diagnosis not present

## 2019-08-10 DIAGNOSIS — E119 Type 2 diabetes mellitus without complications: Secondary | ICD-10-CM | POA: Diagnosis not present

## 2019-08-17 DIAGNOSIS — E1122 Type 2 diabetes mellitus with diabetic chronic kidney disease: Secondary | ICD-10-CM | POA: Diagnosis not present

## 2019-08-17 DIAGNOSIS — E7800 Pure hypercholesterolemia, unspecified: Secondary | ICD-10-CM | POA: Diagnosis not present

## 2019-09-01 DIAGNOSIS — N401 Enlarged prostate with lower urinary tract symptoms: Secondary | ICD-10-CM | POA: Diagnosis not present

## 2019-09-01 DIAGNOSIS — N183 Chronic kidney disease, stage 3 unspecified: Secondary | ICD-10-CM | POA: Diagnosis not present

## 2019-09-01 DIAGNOSIS — I10 Essential (primary) hypertension: Secondary | ICD-10-CM | POA: Diagnosis not present

## 2019-09-01 DIAGNOSIS — N184 Chronic kidney disease, stage 4 (severe): Secondary | ICD-10-CM | POA: Diagnosis not present

## 2019-09-01 DIAGNOSIS — N182 Chronic kidney disease, stage 2 (mild): Secondary | ICD-10-CM | POA: Diagnosis not present

## 2019-09-01 DIAGNOSIS — E7800 Pure hypercholesterolemia, unspecified: Secondary | ICD-10-CM | POA: Diagnosis not present

## 2019-09-01 DIAGNOSIS — E1122 Type 2 diabetes mellitus with diabetic chronic kidney disease: Secondary | ICD-10-CM | POA: Diagnosis not present

## 2019-09-01 DIAGNOSIS — I639 Cerebral infarction, unspecified: Secondary | ICD-10-CM | POA: Diagnosis not present

## 2019-09-01 DIAGNOSIS — E119 Type 2 diabetes mellitus without complications: Secondary | ICD-10-CM | POA: Diagnosis not present

## 2019-10-05 DIAGNOSIS — N401 Enlarged prostate with lower urinary tract symptoms: Secondary | ICD-10-CM | POA: Diagnosis not present

## 2019-10-05 DIAGNOSIS — R3915 Urgency of urination: Secondary | ICD-10-CM | POA: Diagnosis not present

## 2019-10-09 DIAGNOSIS — E119 Type 2 diabetes mellitus without complications: Secondary | ICD-10-CM | POA: Diagnosis not present

## 2019-10-09 DIAGNOSIS — N182 Chronic kidney disease, stage 2 (mild): Secondary | ICD-10-CM | POA: Diagnosis not present

## 2019-10-09 DIAGNOSIS — E7800 Pure hypercholesterolemia, unspecified: Secondary | ICD-10-CM | POA: Diagnosis not present

## 2019-10-09 DIAGNOSIS — N401 Enlarged prostate with lower urinary tract symptoms: Secondary | ICD-10-CM | POA: Diagnosis not present

## 2019-10-09 DIAGNOSIS — E1122 Type 2 diabetes mellitus with diabetic chronic kidney disease: Secondary | ICD-10-CM | POA: Diagnosis not present

## 2019-10-09 DIAGNOSIS — I10 Essential (primary) hypertension: Secondary | ICD-10-CM | POA: Diagnosis not present

## 2019-10-09 DIAGNOSIS — N183 Chronic kidney disease, stage 3 unspecified: Secondary | ICD-10-CM | POA: Diagnosis not present

## 2019-10-09 DIAGNOSIS — I639 Cerebral infarction, unspecified: Secondary | ICD-10-CM | POA: Diagnosis not present

## 2019-10-09 DIAGNOSIS — N184 Chronic kidney disease, stage 4 (severe): Secondary | ICD-10-CM | POA: Diagnosis not present

## 2019-11-02 DIAGNOSIS — E7800 Pure hypercholesterolemia, unspecified: Secondary | ICD-10-CM | POA: Diagnosis not present

## 2019-11-02 DIAGNOSIS — E1122 Type 2 diabetes mellitus with diabetic chronic kidney disease: Secondary | ICD-10-CM | POA: Diagnosis not present

## 2019-11-02 DIAGNOSIS — I10 Essential (primary) hypertension: Secondary | ICD-10-CM | POA: Diagnosis not present

## 2019-11-02 DIAGNOSIS — N183 Chronic kidney disease, stage 3 unspecified: Secondary | ICD-10-CM | POA: Diagnosis not present

## 2019-11-02 DIAGNOSIS — E119 Type 2 diabetes mellitus without complications: Secondary | ICD-10-CM | POA: Diagnosis not present

## 2019-11-02 DIAGNOSIS — N401 Enlarged prostate with lower urinary tract symptoms: Secondary | ICD-10-CM | POA: Diagnosis not present

## 2019-11-02 DIAGNOSIS — N182 Chronic kidney disease, stage 2 (mild): Secondary | ICD-10-CM | POA: Diagnosis not present

## 2019-11-02 DIAGNOSIS — N184 Chronic kidney disease, stage 4 (severe): Secondary | ICD-10-CM | POA: Diagnosis not present

## 2019-11-02 DIAGNOSIS — I639 Cerebral infarction, unspecified: Secondary | ICD-10-CM | POA: Diagnosis not present

## 2019-11-05 DIAGNOSIS — R3121 Asymptomatic microscopic hematuria: Secondary | ICD-10-CM | POA: Diagnosis not present

## 2019-11-05 DIAGNOSIS — N401 Enlarged prostate with lower urinary tract symptoms: Secondary | ICD-10-CM | POA: Diagnosis not present

## 2019-11-05 DIAGNOSIS — R3915 Urgency of urination: Secondary | ICD-10-CM | POA: Diagnosis not present

## 2019-12-11 DIAGNOSIS — Z23 Encounter for immunization: Secondary | ICD-10-CM | POA: Diagnosis not present

## 2019-12-15 DIAGNOSIS — N401 Enlarged prostate with lower urinary tract symptoms: Secondary | ICD-10-CM | POA: Diagnosis not present

## 2019-12-15 DIAGNOSIS — N183 Chronic kidney disease, stage 3 unspecified: Secondary | ICD-10-CM | POA: Diagnosis not present

## 2019-12-15 DIAGNOSIS — E119 Type 2 diabetes mellitus without complications: Secondary | ICD-10-CM | POA: Diagnosis not present

## 2019-12-15 DIAGNOSIS — E1122 Type 2 diabetes mellitus with diabetic chronic kidney disease: Secondary | ICD-10-CM | POA: Diagnosis not present

## 2019-12-15 DIAGNOSIS — I639 Cerebral infarction, unspecified: Secondary | ICD-10-CM | POA: Diagnosis not present

## 2019-12-15 DIAGNOSIS — N182 Chronic kidney disease, stage 2 (mild): Secondary | ICD-10-CM | POA: Diagnosis not present

## 2019-12-15 DIAGNOSIS — E7800 Pure hypercholesterolemia, unspecified: Secondary | ICD-10-CM | POA: Diagnosis not present

## 2019-12-15 DIAGNOSIS — I10 Essential (primary) hypertension: Secondary | ICD-10-CM | POA: Diagnosis not present

## 2019-12-15 DIAGNOSIS — N184 Chronic kidney disease, stage 4 (severe): Secondary | ICD-10-CM | POA: Diagnosis not present

## 2019-12-30 ENCOUNTER — Other Ambulatory Visit: Payer: Self-pay

## 2019-12-30 NOTE — Patient Outreach (Signed)
  [place] [facility]) Care Management Chronic Special Needs Program    12/30/2019  Name: James Henderson, DOB: November 10, 1950  MRN: [id number]   [name] is enrolled in a chronic special needs plan for Diabetes. [facility] will continue to provide services for this member through 02/19/2020. The [facility] Care Management Team will assume care 02/20/2020.   Thea Silversmith, RN, MSN, [place] [phone]

## 2020-01-12 DIAGNOSIS — N184 Chronic kidney disease, stage 4 (severe): Secondary | ICD-10-CM | POA: Diagnosis not present

## 2020-01-12 DIAGNOSIS — N182 Chronic kidney disease, stage 2 (mild): Secondary | ICD-10-CM | POA: Diagnosis not present

## 2020-01-12 DIAGNOSIS — N183 Chronic kidney disease, stage 3 unspecified: Secondary | ICD-10-CM | POA: Diagnosis not present

## 2020-01-12 DIAGNOSIS — I10 Essential (primary) hypertension: Secondary | ICD-10-CM | POA: Diagnosis not present

## 2020-01-12 DIAGNOSIS — N401 Enlarged prostate with lower urinary tract symptoms: Secondary | ICD-10-CM | POA: Diagnosis not present

## 2020-01-12 DIAGNOSIS — H409 Unspecified glaucoma: Secondary | ICD-10-CM | POA: Diagnosis not present

## 2020-01-12 DIAGNOSIS — E119 Type 2 diabetes mellitus without complications: Secondary | ICD-10-CM | POA: Diagnosis not present

## 2020-01-12 DIAGNOSIS — E1122 Type 2 diabetes mellitus with diabetic chronic kidney disease: Secondary | ICD-10-CM | POA: Diagnosis not present

## 2020-01-12 DIAGNOSIS — I639 Cerebral infarction, unspecified: Secondary | ICD-10-CM | POA: Diagnosis not present

## 2020-01-12 DIAGNOSIS — E7800 Pure hypercholesterolemia, unspecified: Secondary | ICD-10-CM | POA: Diagnosis not present

## 2020-01-20 DIAGNOSIS — R413 Other amnesia: Secondary | ICD-10-CM | POA: Diagnosis not present

## 2020-01-20 DIAGNOSIS — N401 Enlarged prostate with lower urinary tract symptoms: Secondary | ICD-10-CM | POA: Diagnosis not present

## 2020-01-20 DIAGNOSIS — N183 Chronic kidney disease, stage 3 unspecified: Secondary | ICD-10-CM | POA: Diagnosis not present

## 2020-01-20 DIAGNOSIS — E1122 Type 2 diabetes mellitus with diabetic chronic kidney disease: Secondary | ICD-10-CM | POA: Diagnosis not present

## 2020-01-20 DIAGNOSIS — E7800 Pure hypercholesterolemia, unspecified: Secondary | ICD-10-CM | POA: Diagnosis not present

## 2020-01-20 DIAGNOSIS — H409 Unspecified glaucoma: Secondary | ICD-10-CM | POA: Diagnosis not present

## 2020-01-20 DIAGNOSIS — Z8673 Personal history of transient ischemic attack (TIA), and cerebral infarction without residual deficits: Secondary | ICD-10-CM | POA: Diagnosis not present

## 2020-01-20 DIAGNOSIS — Z0000 Encounter for general adult medical examination without abnormal findings: Secondary | ICD-10-CM | POA: Diagnosis not present

## 2020-01-20 DIAGNOSIS — I10 Essential (primary) hypertension: Secondary | ICD-10-CM | POA: Diagnosis not present

## 2020-01-20 DIAGNOSIS — Z23 Encounter for immunization: Secondary | ICD-10-CM | POA: Diagnosis not present

## 2020-01-20 DIAGNOSIS — R296 Repeated falls: Secondary | ICD-10-CM | POA: Diagnosis not present

## 2020-01-20 DIAGNOSIS — Z1389 Encounter for screening for other disorder: Secondary | ICD-10-CM | POA: Diagnosis not present

## 2020-01-26 ENCOUNTER — Ambulatory Visit: Payer: HMO | Admitting: Neurology

## 2020-01-26 ENCOUNTER — Encounter: Payer: Self-pay | Admitting: Neurology

## 2020-01-26 ENCOUNTER — Other Ambulatory Visit: Payer: Self-pay

## 2020-01-26 VITALS — BP 170/93 | HR 90 | Resp 20 | Ht 65.0 in | Wt 177.0 lb

## 2020-01-26 DIAGNOSIS — I10 Essential (primary) hypertension: Secondary | ICD-10-CM | POA: Diagnosis not present

## 2020-01-26 DIAGNOSIS — F0280 Dementia in other diseases classified elsewhere without behavioral disturbance: Secondary | ICD-10-CM

## 2020-01-26 DIAGNOSIS — I639 Cerebral infarction, unspecified: Secondary | ICD-10-CM

## 2020-01-26 DIAGNOSIS — D320 Benign neoplasm of cerebral meninges: Secondary | ICD-10-CM

## 2020-01-26 DIAGNOSIS — G309 Alzheimer's disease, unspecified: Secondary | ICD-10-CM

## 2020-01-26 DIAGNOSIS — F0150 Vascular dementia without behavioral disturbance: Secondary | ICD-10-CM | POA: Diagnosis not present

## 2020-01-26 DIAGNOSIS — E785 Hyperlipidemia, unspecified: Secondary | ICD-10-CM

## 2020-01-26 DIAGNOSIS — Z8673 Personal history of transient ischemic attack (TIA), and cerebral infarction without residual deficits: Secondary | ICD-10-CM

## 2020-01-26 DIAGNOSIS — E119 Type 2 diabetes mellitus without complications: Secondary | ICD-10-CM | POA: Diagnosis not present

## 2020-01-26 MED ORDER — DONEPEZIL HCL 5 MG PO TABS: 5.0000 mg | ORAL_TABLET | Freq: Every day | ORAL | 0 refills | Status: DC

## 2020-01-26 NOTE — Progress Notes (Signed)
NEUROLOGY FOLLOW UP OFFICE NOTE  James Henderson [id number]   Subjective:  James Henderson is a 69 year old right-handed man with hypertension, hyperlipidemia, type 2 diabetes, cerebral meningioma and history of brainstem stroke whom I last saw in February 2020 presents today for memory changes.  He is accompanied by his wife who supplements history.  UPDATE: Current medications:  Plavix 75mg , Crestor 40mg , glipizide, losartan  Memory problems started about 9 months ago and has gradually gotten worse.  He frequently misplaces objects.  He can no longer pay bills independently.  He forgets to make monthly payments on the mortgage and car.  He forgets to eat.  He ambulates more slowly.  He is now getting disoriented driving on familiar routes.  He has forgotten how to get to his doctor's offices.     HISTORY: James Henderson has a history of prior small midbrain infarct in 2015.  He was admitted to [facility] from 10/21/2017 to 10/24/2017 after presenting with dizziness and ataxia.  CTA of the head and neck revealed diffuse intracranial atherosclerotic disease and mild atherosclerotic disease in both carotid arteries but no significant stenosis or large vessel occlusion.  MRI of the brain showed moderate atrophy with chronic small vessel ischemic changes as well as extensive chronic microhemorrhages as well as left lateral tentorium 8 x 31mm meningioma (stable since 2015) but no acute infarct.  He had a repeat MRI which then demonstrated acute 5 mm infarct in the left brachium pontis.  2D echocardiogram demonstrated ejection fraction 55 to 60% but was indeterminant for evaluation of LV diastolic function.  LDL was 82.  Hemoglobin A1c was 6.2.  He was previously on aspirin 81 mg daily prior to admission.  He was discharged on Plavix 75 mg daily (dual antiplatelet therapy was not initiated due to evidence of microhemorrhages) and Crestor 40 mg daily.    PAST MEDICAL HISTORY: Past Medical History:    Diagnosis Date  . Brainstem stroke ([place]) 2015  . Cerebral meningioma ([place])   . Diabetes mellitus without complication ([place])   . Glaucoma   . Hyperlipidemia   . Hypertension     MEDICATIONS: Current Outpatient Medications on File Prior to Visit  Medication Sig Dispense Refill  . acetaminophen (TYLENOL) 500 MG tablet Take 1,000 mg by mouth every 6 (six) hours as needed for headache (pain).    . brimonidine (ALPHAGAN) 0.2 % ophthalmic solution Place 1 drop into both eyes 3 (three) times daily.  4  . Brinzolamide-Brimonidine (SIMBRINZA) 1-0.2 % SUSP Place 1 drop into both eyes 2 (two) times daily.     . Cholecalciferol (VITAMIN D3) 400 units CAPS Take 400 Units by mouth every evening.    . clopidogrel (PLAVIX) 75 MG tablet Take 1 tablet (75 mg total) by mouth daily. 30 tablet 0  . dorzolamide (TRUSOPT) 2 % ophthalmic solution Place 1 drop into both eyes 2 (two) times daily.  4  . fluticasone (FLONASE) 50 MCG/ACT nasal spray Place 1 spray into both nostrils daily as needed for allergies or rhinitis.    James Henderson glipiZIDE (GLUCOTROL) 5 MG tablet Take 5 mg by mouth daily.    . Inositol Niacinate (NIACIN FLUSH FREE) 500 MG CAPS Take 500 mg by mouth every evening.    . latanoprost (XALATAN) 0.005 % ophthalmic solution Place 1 drop into both eyes at bedtime.  1  . losartan (COZAAR) 100 MG tablet Take 100 mg by mouth daily.  1  . metFORMIN (GLUCOPHAGE) 1000 MG tablet Take  1,000 mg by mouth daily with supper.  1  . Omega-3 Fatty Acids (FISH OIL) 1000 MG CAPS Take 1,000 mg by mouth every evening.    James Henderson (SYSTANE ULTRA) 0.4-0.3 % SOLN Place 1 drop into both eyes daily as needed (dryness or itching).    . rosuvastatin (CRESTOR) 40 MG tablet Take 1 tablet (40 mg total) by mouth daily at 6 PM. 30 tablet 0  . timolol (BETIMOL) 0.5 % ophthalmic solution Place 1 drop into both eyes daily.      No current facility-administered medications on file prior to visit.    ALLERGIES: No  Known Allergies  FAMILY HISTORY: Family History  Problem Relation Age of Onset  . Stroke Mother   . Stroke Brother   . Diabetes type II Brother     SOCIAL HISTORY: Social History   Socioeconomic History  . Marital status: Married    Spouse name: Mary  . Number of children: 1  . Years of education: 21  . Highest education level: 12th grade  Occupational History  . Occupation: maintenance  Tobacco Use  . Smoking status: Former Research scientist (life sciences)  . Smokeless tobacco: Never Used  Vaping Use  . Vaping Use: Never used  Substance and Sexual Activity  . Alcohol use: No    Alcohol/week: 0.0 standard drinks  . Drug use: No  . Sexual activity: Not on file  Other Topics Concern  . Not on file  Social History Narrative   Patient is married with one biological child and one adopted.   Patient is right handed.   Patient has hs education.   Patient drinks caffeine rarely.   Social Determinants of Health   Financial Resource Strain:   . Difficulty of Paying Living Expenses: Not on file  Food Insecurity:   . Worried About Charity fundraiser in the Last Year: Not on file  . Ran Out of Food in the Last Year: Not on file  Transportation Needs:   . Lack of Transportation (Medical): Not on file  . Lack of Transportation (Non-Medical): Not on file  Physical Activity:   . Days of Exercise per Week: Not on file  . Minutes of Exercise per Session: Not on file  Stress:   . Feeling of Stress : Not on file  Social Connections:   . Frequency of Communication with Friends and Family: Not on file  . Frequency of Social Gatherings with Friends and Family: Not on file  . Attends Religious Services: Not on file  . Active Member of Clubs or Organizations: Not on file  . Attends Archivist Meetings: Not on file  . Marital Status: Not on file  Intimate Partner Violence:   . Fear of Current or Ex-Partner: Not on file  . Emotionally Abused: Not on file  . Physically Abused: Not on file  .  Sexually Abused: Not on file     Objective:  Blood pressure (!) 170/93, pulse 90, resp. rate 20, height 5\' 5"  (1.651 m), weight 177 lb (80.3 kg), SpO2 97 %. General: No acute distress.  Patient appears well-groomed.   Head:  Normocephalic/atraumatic Eyes:  Fundi examined but not visualized Neck: supple, no paraspinal tenderness, full range of motion Heart:  Regular rate and rhythm Lungs:  Clear to auscultation bilaterally Back: No paraspinal tenderness Neurological Exam:    [employer] Mental Exam 01/26/2020  Weekday Correct 1  Current year 1  What state are we in? 0  Amount spent 0  Amount left 0  # of Animals 0  5 objects recall 0  Number series 0  Hour markers 0  Time correct 0  Placed X in triangle correctly 1  Largest Figure 1  Name of male 0  Date back to work 0  Type of work 0  State she lived in 0  Total score 4  Speech fluent.  Language intact. CN II-XII intact. Bulk and tone normal, muscle strength 5/5 throughout.  Sensation to pinprick intact.  Vibratory sensation reduced in toes.  Deep tendon reflexes 1+ throughout, toes downgoing.  Finger to nose testing intact.  Slightly wide-based gait.     Assessment/Plan:   1.  Memory changes - vascular vs neurodegenerative vs mixed 2.  History of stroke 3.  Hypertension 4.  Hyperlipidemia 5.  Type 2 diabetes mellitus 6.  Cerebral meningioma,   1.  Repeat MRI of brain 2.  Check B12 and TSH 3.  Start donepezil 5mg  at bedtime for one month, then 10mg  at bedtime 4.  Secondary stroke prevention as managed by PCP -  Plavix 75mg  daily -  Statin therapy.  LDL goal less than 70 -  Blood pressure control (follow up with PCP) -  Glycemic control.  Hgb A1c goal less than 7 5.  No driving 6.  Follow up in 6 months.  Metta Clines, DO  CC:  Marilynne Drivers, PA

## 2020-01-26 NOTE — Patient Instructions (Signed)
1.  We will start donepezil (Aricept) 5mg  daily for four weeks.  If you are tolerating the medication, then after four weeks, we will increase the dose to 10mg  daily.  Side effects include nausea, vomiting, diarrhea, vivid dreams, and muscle cramps.  Please call the clinic if you experience any of these symptoms. 2.  Please do not drive 3.  Follow up in 6 months   Dementia Dementia is a condition that affects the way the brain works. It often affects memory and thinking. There are many types of dementia. Some types get worse with time and cannot be reversed. Some types of dementia include:  Alzheimer's disease. This is the most common type.  Vascular dementia. This type may happen due to a stroke.  Lewy body dementia. This type may happen to people who have Parkinson's disease.  Frontotemporal dementia. This type is caused by damage to nerve cells in certain parts of the brain. Some people may have more than one type, and this is called mixed dementia. What are the causes? This condition is caused by damage to cells in the brain. Some causes that cannot be reversed include:  Having a condition that affects the blood vessels of the brain, such as diabetes, heart disease, or blood vessel disease.  Changes to genes. Some causes that can be reversed or slowed include:  Injury to the brain.  Certain medicines.  Infection.  Not having enough vitamin B12 in the body, or thyroid problems.  A tumor or blood clot in the brain. What are the signs or symptoms? Symptoms depend on the type of dementia. This may include:  Problems remembering things.  Having trouble taking a bath or putting clothes on.  Forgetting appointments.  Forgetting to pay bills.  Trouble planning and making meals.  Having trouble speaking.  Getting lost easily. How is this treated? Treatment depends on the cause of the dementia. It might include taking medicines that help:  To control the dementia.  To  slow down the dementia.  To manage symptoms. In some cases, treating the cause of your dementia can improve symptoms, reverse symptoms, or slow down how quickly it gets worse. Your doctor can help you find support groups and other doctors who can help with your care. Follow these instructions at home: Medicines  Take over-the-counter and prescription medicines only as told by your doctor.  Use a pill organizer to help you manage your medicines.  Avoidtaking medicines for pain or for sleep. Lifestyle  Make healthy choices: ? Be active as told by your doctor. ? Do not use any products that contain nicotine or tobacco, such as cigarettes, e-cigarettes, and chewing tobacco. If you need help quitting, ask your doctor. ? Do not drink alcohol. ? When you get stressed, do something that will help you to relax. Your doctor can give you tips. ? Spend time with other people.  Make sure you get good sleep. To get good sleep: ? Try not to take naps during the day. ? Keep your bedroom dark and cool. ? In the few hours before you go to bed, try not to do any exercise. ? Do not have foods and drinks with caffeine at night. Eating and drinking  Drink enough fluid to keep your pee (urine) pale yellow.  Eat a healthy diet. General instructions   Talk with your doctor to figure out: ? What you need help with. ? What your safety needs are.  Ask your doctor if it is safe for you  to drive.  If told, wear a bracelet that tracks where you are or shows that you are a person with memory loss.  Work with your family to make big decisions.  Keep all follow-up visits as told by your doctor. This is important. Contact a doctor if:  You have any new symptoms.  Your symptoms get worse.  You have problems with swallowing or choking. Get help right away if:  You feel very sad, or feel that you want to harm yourself.  You or your family members are worried for your safety. If you ever feel like  you may hurt yourself or others, or have thoughts about taking your own life, get help right away. You can go to your nearest emergency department or call:  Your local emergency services (911 in the [place]  A suicide crisis helpline, such as the [place] at 450-472-7477. This is open 24 hours a day. Summary  Dementia often affects memory and thinking.  Some types of dementia get worse with time and cannot be reversed.  Treatment for this condition depends on the cause.  Talk with your doctor to figure out what you need help with.  Your doctor can help you find support groups and other doctors who can help with your care. This information is not intended to replace advice given to you by your health care provider. Make sure you discuss any questions you have with your health care provider. Document Revised: 04/22/2018 Document Reviewed: 04/22/2018 Elsevier Patient Education  [place].

## 2020-02-01 DIAGNOSIS — E7800 Pure hypercholesterolemia, unspecified: Secondary | ICD-10-CM | POA: Diagnosis not present

## 2020-02-01 DIAGNOSIS — I10 Essential (primary) hypertension: Secondary | ICD-10-CM | POA: Diagnosis not present

## 2020-02-01 DIAGNOSIS — H409 Unspecified glaucoma: Secondary | ICD-10-CM | POA: Diagnosis not present

## 2020-02-01 DIAGNOSIS — N183 Chronic kidney disease, stage 3 unspecified: Secondary | ICD-10-CM | POA: Diagnosis not present

## 2020-02-01 DIAGNOSIS — E119 Type 2 diabetes mellitus without complications: Secondary | ICD-10-CM | POA: Diagnosis not present

## 2020-02-01 DIAGNOSIS — N182 Chronic kidney disease, stage 2 (mild): Secondary | ICD-10-CM | POA: Diagnosis not present

## 2020-02-01 DIAGNOSIS — E1122 Type 2 diabetes mellitus with diabetic chronic kidney disease: Secondary | ICD-10-CM | POA: Diagnosis not present

## 2020-02-01 DIAGNOSIS — N184 Chronic kidney disease, stage 4 (severe): Secondary | ICD-10-CM | POA: Diagnosis not present

## 2020-02-01 DIAGNOSIS — N401 Enlarged prostate with lower urinary tract symptoms: Secondary | ICD-10-CM | POA: Diagnosis not present

## 2020-02-04 DIAGNOSIS — Z8673 Personal history of transient ischemic attack (TIA), and cerebral infarction without residual deficits: Secondary | ICD-10-CM | POA: Diagnosis not present

## 2020-02-04 DIAGNOSIS — R296 Repeated falls: Secondary | ICD-10-CM | POA: Diagnosis not present

## 2020-02-08 ENCOUNTER — Other Ambulatory Visit: Payer: Self-pay | Admitting: Gastroenterology

## 2020-02-08 DIAGNOSIS — R159 Full incontinence of feces: Secondary | ICD-10-CM | POA: Diagnosis not present

## 2020-02-08 DIAGNOSIS — Z86010 Personal history of colonic polyps: Secondary | ICD-10-CM | POA: Diagnosis not present

## 2020-02-08 DIAGNOSIS — Z7901 Long term (current) use of anticoagulants: Secondary | ICD-10-CM | POA: Diagnosis not present

## 2020-02-09 ENCOUNTER — Encounter (HOSPITAL_COMMUNITY): Payer: Self-pay | Admitting: Gastroenterology

## 2020-02-09 NOTE — Progress Notes (Signed)
Spoke with patient's wife, Jermario Kalmar, about colonoscopy scheduled for 02/15/20. Patient has memory issues and wife will need to come with him to answer all questions. She has received written instructions from Dr Encarnacion Slates office. Covid test is scheduled for 02/11/20. To hold plavix after 02/09/20 evening dose. To hold all diabetic medications the morning of the procedure.

## 2020-02-10 DIAGNOSIS — N3941 Urge incontinence: Secondary | ICD-10-CM | POA: Diagnosis not present

## 2020-02-10 DIAGNOSIS — R3121 Asymptomatic microscopic hematuria: Secondary | ICD-10-CM | POA: Diagnosis not present

## 2020-02-10 DIAGNOSIS — N401 Enlarged prostate with lower urinary tract symptoms: Secondary | ICD-10-CM | POA: Diagnosis not present

## 2020-02-11 ENCOUNTER — Other Ambulatory Visit (HOSPITAL_COMMUNITY)
Admission: RE | Admit: 2020-02-11 | Discharge: 2020-02-11 | Disposition: A | Discharge status: Home / Self Care | Payer: HMO | Source: Ambulatory Visit | Attending: Gastroenterology | Admitting: Gastroenterology

## 2020-02-11 DIAGNOSIS — Z01812 Encounter for preprocedural laboratory examination: Secondary | ICD-10-CM | POA: Diagnosis not present

## 2020-02-11 DIAGNOSIS — Z20822 Contact with and (suspected) exposure to covid-19: Secondary | ICD-10-CM | POA: Diagnosis not present

## 2020-02-11 LAB — SARS CORONAVIRUS 2 (TAT 6-24 HRS): SARS Coronavirus 2: NEGATIVE

## 2020-02-11 NOTE — Progress Notes (Signed)
Talked with patients wife regarding procedure on Monday 27th. Wife and patient understands that he needs to quarantine over the weekend and that if he does not he will need to come 3 hours early for a rapid test piror to the procedure. Asked wife to call if patient has sickness or gets fever or does not quarantine to let us know and to leave a message at [phone]

## 2020-02-14 NOTE — Progress Notes (Signed)
Pre-call completed for additional screening for day before procedure.  Spoke with pts wife, Jeannie Done.  Jeannie Done confirmed pt does not have signs/symptoms of Covid (fever, cold symptoms, body aches, etc.)  Over the weekend, the pts daughter visited and she does not reside with family.  Informed pts spouse that the pt will need to arrive at 7:15am for a rapid Covid test.  Pts spouse verbalized understanding.

## 2020-02-15 ENCOUNTER — Encounter (HOSPITAL_COMMUNITY)
Admission: RE | Disposition: A | Discharge status: Home / Self Care | Payer: Self-pay | Source: Home / Self Care | Attending: Gastroenterology

## 2020-02-15 ENCOUNTER — Ambulatory Visit (HOSPITAL_COMMUNITY): Payer: HMO | Admitting: Anesthesiology

## 2020-02-15 ENCOUNTER — Other Ambulatory Visit: Payer: Self-pay

## 2020-02-15 ENCOUNTER — Encounter (HOSPITAL_COMMUNITY): Payer: Self-pay | Admitting: Gastroenterology

## 2020-02-15 ENCOUNTER — Ambulatory Visit (HOSPITAL_COMMUNITY)
Admission: RE | Admit: 2020-02-15 | Discharge: 2020-02-15 | Disposition: A | Discharge status: Home / Self Care | Payer: HMO | Attending: Gastroenterology | Admitting: Gastroenterology

## 2020-02-15 DIAGNOSIS — F0390 Unspecified dementia without behavioral disturbance: Secondary | ICD-10-CM | POA: Insufficient documentation

## 2020-02-15 DIAGNOSIS — Z7984 Long term (current) use of oral hypoglycemic drugs: Secondary | ICD-10-CM | POA: Insufficient documentation

## 2020-02-15 DIAGNOSIS — Z20822 Contact with and (suspected) exposure to covid-19: Secondary | ICD-10-CM | POA: Insufficient documentation

## 2020-02-15 DIAGNOSIS — D124 Benign neoplasm of descending colon: Secondary | ICD-10-CM | POA: Diagnosis not present

## 2020-02-15 DIAGNOSIS — Z86010 Personal history of colonic polyps: Secondary | ICD-10-CM | POA: Diagnosis not present

## 2020-02-15 DIAGNOSIS — K648 Other hemorrhoids: Secondary | ICD-10-CM | POA: Diagnosis not present

## 2020-02-15 DIAGNOSIS — K644 Residual hemorrhoidal skin tags: Secondary | ICD-10-CM | POA: Diagnosis not present

## 2020-02-15 DIAGNOSIS — D122 Benign neoplasm of ascending colon: Secondary | ICD-10-CM | POA: Insufficient documentation

## 2020-02-15 DIAGNOSIS — Z1211 Encounter for screening for malignant neoplasm of colon: Secondary | ICD-10-CM | POA: Diagnosis not present

## 2020-02-15 DIAGNOSIS — R159 Full incontinence of feces: Secondary | ICD-10-CM | POA: Diagnosis not present

## 2020-02-15 DIAGNOSIS — D123 Benign neoplasm of transverse colon: Secondary | ICD-10-CM | POA: Insufficient documentation

## 2020-02-15 DIAGNOSIS — K635 Polyp of colon: Secondary | ICD-10-CM | POA: Diagnosis not present

## 2020-02-15 DIAGNOSIS — Z8673 Personal history of transient ischemic attack (TIA), and cerebral infarction without residual deficits: Secondary | ICD-10-CM | POA: Diagnosis not present

## 2020-02-15 DIAGNOSIS — Z7902 Long term (current) use of antithrombotics/antiplatelets: Secondary | ICD-10-CM | POA: Insufficient documentation

## 2020-02-15 DIAGNOSIS — Z79899 Other long term (current) drug therapy: Secondary | ICD-10-CM | POA: Diagnosis not present

## 2020-02-15 DIAGNOSIS — Z87891 Personal history of nicotine dependence: Secondary | ICD-10-CM | POA: Insufficient documentation

## 2020-02-15 HISTORY — PX: POLYPECTOMY: SHX5525

## 2020-02-15 HISTORY — PX: BIOPSY: SHX5522

## 2020-02-15 HISTORY — PX: COLONOSCOPY WITH PROPOFOL: SHX5780

## 2020-02-15 LAB — GLUCOSE, CAPILLARY: Glucose-Capillary: 111 mg/dL — ABNORMAL HIGH (ref 70–99)

## 2020-02-15 LAB — SARS CORONAVIRUS 2 BY RT PCR (HOSPITAL ORDER, PERFORMED IN ~~LOC~~ HOSPITAL LAB): SARS Coronavirus 2: NEGATIVE

## 2020-02-15 SURGERY — COLONOSCOPY WITH PROPOFOL
Anesthesia: Monitor Anesthesia Care

## 2020-02-15 MED ORDER — PROPOFOL 500 MG/50ML IV EMUL
INTRAVENOUS | Status: AC
  Filled 2020-02-15: qty 50

## 2020-02-15 MED ORDER — PROPOFOL 500 MG/50ML IV EMUL
INTRAVENOUS | Status: DC | PRN
  Administered 2020-02-15: 125 ug/kg/min via INTRAVENOUS

## 2020-02-15 MED ORDER — PROPOFOL 10 MG/ML IV BOLUS
INTRAVENOUS | Status: DC | PRN
Start: 2020-02-15 — End: 2020-02-15
  Administered 2020-02-15 (×2): 20 mg via INTRAVENOUS

## 2020-02-15 MED ORDER — LACTATED RINGERS IV SOLN: INTRAVENOUS | Status: DC | PRN

## 2020-02-15 MED ORDER — SODIUM CHLORIDE 0.9 % IV SOLN: INTRAVENOUS | Status: DC

## 2020-02-15 MED ORDER — LACTATED RINGERS IV SOLN: Freq: Once | INTRAVENOUS | Status: DC

## 2020-02-15 SURGICAL SUPPLY — 22 items

## 2020-02-15 NOTE — Anesthesia Postprocedure Evaluation (Signed)
Anesthesia Post Note  Patient: James Henderson  Procedure(s) Performed: COLONOSCOPY WITH PROPOFOL (N/A ) POLYPECTOMY BIOPSY     Patient location during evaluation: PACU Anesthesia Type: MAC Level of consciousness: awake and alert Pain management: pain level controlled Vital Signs Assessment: post-procedure vital signs reviewed and stable Respiratory status: spontaneous breathing, nonlabored ventilation, respiratory function stable and patient connected to nasal cannula oxygen Cardiovascular status: stable and blood pressure returned to baseline Postop Assessment: no apparent nausea or vomiting Anesthetic complications: no   No complications documented.  Last Vitals:  Vitals:   02/15/20 1110 02/15/20 1120  BP: (!) 151/75 (!) 145/78  Pulse: (!) 59 (!) 56  Resp: 15 15  Temp:    SpO2: 98% 97%    Last Pain:  Vitals:   02/15/20 1120  TempSrc:   PainSc: 0-No pain                 [name] S

## 2020-02-15 NOTE — Discharge Instructions (Signed)
Colonoscopy, Adult, Care After This sheet gives you information about how to care for yourself after your procedure. Your doctor may also give you more specific instructions. If you have problems or questions, call your doctor. What can I expect after the procedure? After the procedure, it is common to have:  A small amount of blood in your poop (stool) for 24 hours.  Some gas.  Mild cramping or bloating in your belly (abdomen). Follow these instructions at home: Eating and drinking   Drink enough fluid to keep your pee (urine) pale yellow.  Follow instructions from your doctor about what you cannot eat or drink.  Return to your normal diet as told by your doctor. Avoid heavy or fried foods that are hard to digest. Activity  Rest as told by your doctor.  Do not sit for a long time without moving. Get up to take short walks every 1-2 hours. This is important. Ask for help if you feel weak or unsteady.  Return to your normal activities as told by your doctor. Ask your doctor what activities are safe for you. To help cramping and bloating:   Try walking around.  Put heat on your belly as told by your doctor. Use the heat source that your doctor recommends, such as a moist heat pack or a heating pad. ? Put a towel between your skin and the heat source. ? Leave the heat on for 20-30 minutes. ? Remove the heat if your skin turns bright red. This is very important if you are unable to feel pain, heat, or cold. You may have a greater risk of getting burned. General instructions  For the first 24 hours after the procedure: ? Do not drive or use machinery. ? Do not sign important documents. ? Do not drink alcohol. ? Do your daily activities more slowly than normal. ? Eat foods that are soft and easy to digest.  Take over-the-counter or prescription medicines only as told by your doctor.  Keep all follow-up visits as told by your doctor. This is important. Contact a doctor  if:  You have blood in your poop 2-3 days after the procedure. Get help right away if:  You have more than a small amount of blood in your poop.  You see large clumps of tissue (blood clots) in your poop.  Your belly is swollen.  You feel like you may vomit (nauseous).  You vomit.  You have a fever.  You have belly pain that gets worse, and medicine does not help your pain. Summary  After the procedure, it is common to have a small amount of blood in your poop. You may also have mild cramping and bloating in your belly.  For the first 24 hours after the procedure, do not drive or use machinery, do not sign important documents, and do not drink alcohol.  Get help right away if you have a lot of blood in your poop, feel like you may vomit, have a fever, or have more belly pain. This information is not intended to replace advice given to you by your health care provider. Make sure you discuss any questions you have with your health care provider. Document Revised: 09/01/2018 Document Reviewed: 09/01/2018 Elsevier Patient Education  2020 Elsevier Inc.  

## 2020-02-15 NOTE — Op Note (Signed)
[facility] Patient Name: James Henderson Procedure Date: 02/15/2020 MRN: [id number] Attending MD: Ronnette Juniper , MD Date of Birth: [birth year] CSN: [id number] Age: 69 Admit Type: Inpatient Procedure:                Colonoscopy Indications:              High risk colon cancer surveillance: Personal                            history of colonic polyps, High risk colon cancer                            surveillance: Personal history of multiple (3 or                            more) adenomas, Last colonoscopy: October 2018 Providers:                Ronnette Juniper, MD, Benay Pillow, RN, Benetta Spar,                            Technician, Tyna Jaksch Technician, Caryl Pina CRNA Referring MD:             Mancel Bale. Deneise Lever, MD Medicines:                Monitored Anesthesia Care Complications:            No immediate complications. Estimated blood loss:                            Minimal. Estimated Blood Loss:     Estimated blood loss was minimal. Procedure:                Pre-Anesthesia Assessment:                           - Prior to the procedure, a History and Physical                            was performed, and patient medications and                            allergies were reviewed. The patient's tolerance of                            previous anesthesia was also reviewed. The risks                            and benefits of the procedure and the sedation                            options and risks were discussed with the patient.  All questions were answered, and informed consent                            was obtained. Prior Anticoagulants: The patient has                            taken Plavix (clopidogrel), last dose was 5 days                            prior to procedure. ASA Grade Assessment: III - A                            patient with severe systemic disease. After                             reviewing the risks and benefits, the patient was                            deemed in satisfactory condition to undergo the                            procedure.                           After obtaining informed consent, the colonoscope                            was passed under direct vision. Throughout the                            procedure, the patient's blood pressure, pulse, and                            oxygen saturations were monitored continuously. The                            PCF-H190DL ([id number] Olympus pediatric colonscope                            was introduced through the anus and advanced to the                            the cecum, identified by appendiceal orifice and                            ileocecal valve. The colonoscopy was performed                            without difficulty. The patient tolerated the                            procedure well. The quality of the bowel  preparation was fair. Scope In: 10:17:04 AM Scope Out: 10:49:52 AM Scope Withdrawal Time: 0 hours 23 minutes 15 seconds  Total Procedure Duration: 0 hours 32 minutes 48 seconds  Findings:      Skin tags were found on perianal exam.      Two sessile polyps were found in the transverse colon. The polyps were 6       to 7 mm in size. These polyps were removed with a hot snare. Resection       and retrieval were complete.      Four sessile polyps were found in the ascending colon. The polyps were 6       to 12 mm in size. These polyps were removed with a hot snare. Resection       and retrieval were complete.      Biopsies for histology were taken with a cold forceps for evaluation of       microscopic colitis.      A 4 mm polyp was found in the descending colon. The polyp was sessile.       The polyp was removed with a hot snare. Resection and retrieval were       complete.      Non-bleeding internal hemorrhoids were found during retroflexion. The        hemorrhoids were large.      Although, the patient reports not taking plavix for at least 5 days,       there was oozing noted post biopsy as well as post hot snare       polypectomy, it was mostly self limited. Impression:               - Preparation of the colon was fair.                           - Perianal skin tags found on perianal exam.                           - Two 6 to 7 mm polyps in the transverse colon,                            removed with a hot snare. Resected and retrieved.                           - Four 6 to 12 mm polyps in the ascending colon,                            removed with a hot snare. Resected and retrieved.                           - One 4 mm polyp in the descending colon, removed                            with a hot snare. Resected and retrieved.                           - Non-bleeding internal hemorrhoids.                           -  Biopsies were taken with a cold forceps for                            evaluation of microscopic colitis. Moderate Sedation:      Patient did not receive moderate sedation for this procedure, but       instead received monitored anesthesia care. Recommendation:           - Patient has a contact number available for                            emergencies. The signs and symptoms of potential                            delayed complications were discussed with the                            patient. Return to normal activities tomorrow.                            Written discharge instructions were provided to the                            patient.                           - Resume regular diet.                           - Continue present medications.                           - Resume Plavix (clopidogrel) at prior dose in 3                            days.                           - Await pathology results.                           - Repeat colonoscopy for surveillance based on                            pathology  results. Procedure Code(s):        --- Professional ---                           [phone], Colonoscopy, flexible; with removal of                            tumor(s), polyp(s), or other lesion(s) by snare                            technique                           45380, 59, Colonoscopy, flexible;  with biopsy,                            single or multiple Diagnosis Code(s):        --- Professional ---                           K64.8, Other hemorrhoids                           K63.5, Polyp of colon                           K64.4, Residual hemorrhoidal skin tags                           Z86.010, Personal history of colonic polyps CPT copyright 2019 American Medical Association. All rights reserved. The codes documented in this report are preliminary and upon coder review may  be revised to meet current compliance requirements. Ronnette Juniper, MD 02/15/2020 10:58:30 AM This report has been signed electronically. Number of Addenda: 0

## 2020-02-15 NOTE — Transfer of Care (Signed)
Immediate Anesthesia Transfer of Care Note  Patient: James Henderson  Procedure(s) Performed: COLONOSCOPY WITH PROPOFOL (N/A ) POLYPECTOMY BIOPSY  Patient Location: Endoscopy Unit  Anesthesia Type:MAC  Level of Consciousness: drowsy and responds to stimulation  Airway & Oxygen Therapy: Patient Spontanous Breathing and Patient connected to face mask oxygen  Post-op Assessment: Report given to RN, Post -op Vital signs reviewed and stable and Patient moving all extremities X 4  Post vital signs: Reviewed and stable  Last Vitals:  Vitals Value Taken Time  BP 134/66 02/15/20 1056  Temp    Pulse 71 02/15/20 1058  Resp 18 02/15/20 1058  SpO2 100 % 02/15/20 1058  Vitals shown include unvalidated device data.  Last Pain:  Vitals:   02/15/20 0951  TempSrc: Oral  PainSc: 0-No pain         Complications: No complications documented.

## 2020-02-15 NOTE — Progress Notes (Signed)
Pt with generalized weakness, admitting nurse states pt had generalized weakness upon arrival to endo, escorted by wheelchair to car by rn x2, wife awaiting on pt and confirms pt has weakness all the time, and has fallen previously, peri care and disposable pants given, incontinence of urine noted before dc home, safety maintained

## 2020-02-15 NOTE — Anesthesia Preprocedure Evaluation (Signed)
Anesthesia Evaluation  Patient identified by MRN, date of birth, ID band Patient awake    Reviewed: Allergy & Precautions, H&P , NPO status , Patient's Chart, lab work & pertinent test results  Airway Mallampati: II  TM Distance: >3 FB Neck ROM: Full    Dental no notable dental hx.    Pulmonary neg pulmonary ROS, former smoker,    Pulmonary exam normal breath sounds clear to auscultation       Cardiovascular hypertension, Pt. on medications Normal cardiovascular exam Rhythm:Regular Rate:Normal     Neuro/Psych CVA negative psych ROS   GI/Hepatic negative GI ROS, Neg liver ROS,   Endo/Other  diabetes  Renal/GU Renal InsufficiencyRenal disease  negative genitourinary   Musculoskeletal negative musculoskeletal ROS (+)   Abdominal   Peds negative pediatric ROS (+)  Hematology negative hematology ROS (+)   Anesthesia Other Findings   Reproductive/Obstetrics negative OB ROS                             Anesthesia Physical Anesthesia Plan  ASA: III  Anesthesia Plan: MAC   Post-op Pain Management:    Induction: Intravenous  PONV Risk Score and Plan: 0  Airway Management Planned: Simple Face Mask  Additional Equipment:   Intra-op Plan:   Post-operative Plan:   Informed Consent: I have reviewed the patients History and Physical, chart, labs and discussed the procedure including the risks, benefits and alternatives for the proposed anesthesia with the patient or authorized representative who has indicated his/her understanding and acceptance.     Dental advisory given  Plan Discussed with: CRNA and Surgeon  Anesthesia Plan Comments:         Anesthesia Quick Evaluation

## 2020-02-15 NOTE — Interval H&P Note (Signed)
History and Physical Interval Note: 69/male with history of removal of multiple adenomas of colon in 2018 for a surveillance colonoscopy.  02/15/2020 9:44 AM  James Henderson  has presented today for colonoscopy, with the diagnosis of Hx of colon polyps.  The various methods of treatment have been discussed with the patient and family. After consideration of risks, benefits and other options for treatment, the patient has consented to  Procedure(s): COLONOSCOPY WITH PROPOFOL (N/A) as a surgical intervention.  The patient's history has been reviewed, patient examined, no change in status, stable for surgery.  I have reviewed the patient's chart and labs.  Questions were answered to the patient's satisfaction.     Ronnette Juniper

## 2020-02-15 NOTE — H&P (Signed)
History of Present Illness  General:          69 year old male referred for repeat colonoscopy change in bowel habits.         In 11/2016, on colonoscopy performed for screening, he had 5 tubular adenomas removed and was recommended to repeat in 3 years. Patient was noted to have unremarkable colon otherwise.        Labs with his PCP from 12/21 showed an unremarkable CBC, elevated blood sugar 140, creatinine 1.4, GFR 60, normal LFTs, normal vitamin B12, normal TSH.        He developed dementia for the past few months,and get forgetful.        He has an appointment with his urologist and noted blood in urine and has an appointment on Wednesday.        His wife speaks mostly for him.        He has no control of feces and has had accidents in public spaces, it has been ongoing for a few months.        Stools are mostly like diarrhea.        He has a BM not daily.        He is on plavix which was given after his stroke 2 years ago,he uses a walker, his balance has been affected, his speech is ok.        Denies abdominal or rectal pain, his wife thinks he has lost weight but the scales are not reflextive of such.        Denies nausea, vomiting, denies difficulty or pain on swallowing.        Denies blood in stool or black stools.    Current Medications  Taking  .amLODIPine Besylate 5 MG Tablet 1 tablet Orally Once a day  .Clopidogrel Bisulfate 75 MG Tablet take 1 tablet by mouth every day  .glipiZIDE 5 MG Tablet 1 tablet 30 minutes before breakfast Orally Once a day for diabetes  .Losartan Potassium 100 MG Tablet take 1 tablet by mouth every day for blood pressure  .metFORMIN HCl 1000 MG Tablet 1 tablet in the AM and 1/2 tablet in the PM with food Orally twice a day for diabetes  .Rosuvastatin Calcium 40 MG Tablet take 1 tablet by mouth every day for cholesterol, Notes: Hospital .Zetia(Ezetimibe) 10 MG Tablet 1 tablet Orally Once a day  .Acetaminophen 500 MG Tablet 2 tablets as needed for  headeaches or pain Orally every 6 hrs, Notes: LIsted in Epic  .Brimonidine Tartrate 0.2 % Solution 1 drop into both eyes Ophthalmic three times a day, Notes: LIsted in Epic  .Dorzolamide HCl 2 % Solution 1 drop into both eyes Ophthalmic twice a day, Notes: LIsted in Epic  .Latanoprost 0.005 % Solution 1 drop into both eyes at bedtime Ophthalmic Once a day, Notes: LIsted in Epic  .Simbrinza(Brinzolamide-Brimonidine) 1-0.2 % Suspension 1 drop into affected eye Ophthalmic Three times a day .Systane Ultra(Polyethyl Glycol-Propyl Glycol) 0.4-0.3 % Solution 1 drop into both eyes as needed for dryness or itching Ophthalmic once a day, Notes: LIsted in Epic .Timolol Maleate 0.5 % Solution 1 drop into both eyes Ophthalmic Once a day, Notes: LIsted in Epic  .Fluticasone Propionate 50 MCG/ACT Suspension 1 spray in each nostril as needed for allergies or rhinitis Nasally Once a day, Notes: LIsted in Epic  .Niacin Flush Free(Inositol Niacinate) 500 MG Capsule 1 capsule Orally Once a day  .Omega-3 1000 MG Capsule 1 capsule in the evening Orally Once a  day, Notes: LIsted in Epic  .Vitamin D3 400 UNIT Tablet 1 tablet Orally Once a day, Notes: LIsted in Epic    Medication List reviewed and reconciled with the patient   Past Medical History       T2DM.       Hypercholesterolemia.       HTN.       CVA, recurrent CVA of cerebellum 9/19 - [name].       BPH w/ LUTS, OAB- Dr. Milford Cage.       glaucoma - Dr. Gwynneth Aliment.       CKD.       dementia- Dr. Loretta Plume, eval 12/21.     Surgical History        Cataracts 2017        colonoscopy 11/22/16       Family History  Father: deceased  Mother: deceased, diagnosed with CVA, Diabetes, Hypertension  Paternal [place] Father: deceased  Paternal Grand Mother: deceased, diabetes  Maternal Grand Father: deceased  Maternal Grand Mother: deceased, cancer, ovarian, diagnosed with Hypertension  Brother 1: alive, HTN, DM, cholesterol, CVA 57  1 brother(s) . 1 son(s) .        Social History  General:   Tobacco use       cigarettes:  Former smoker     Quit in year  1990     Pack-year Hx:  7     Tobacco history last updated  02/08/2020     Vaping  No Alcohol: yes, beer, occasionally.  Caffeine: coffee de-caff, 1 serving daily.  no Recreational drug use.  Exercise: yes, nothing structured, walks couple of times weekly.  Marital Status: married, [facility].  Children: 3 sons; 1 biological (Robert-62 years old) 2 adopted Gareth Eagle 71 years old and Corene Cornea 71 years old).  OCCUPATION: retired Land at [employer].  Religion: Catholic.  Seat belt use: yes.        Hospitalization/Major Diagnostic Procedure  none in the past year 08/2016  not in the past year 01/2020       Review of Systems  GI PROCEDURE:          Pacemaker/ AICD no.  Artificial heart valves no.  MI/heart attack no.  Abnormal heart rhythm no.  Angina no.  CVA YES 2 year ago.  Hypertension YES.  Hypotension no.  Asthma, COPD no.  Sleep apnea no.  Seizure disorders no.  Artificial joints no.  Severe DJD no.  Diabetes YES, type II.  Significant headaches no.  Vertigo no.  Depression/anxiety no.  Abnormal bleeding no.  Kidney Disease no.  Liver disease no.  Chance of pregnancy no.  Blood transfusion no.            Vital Signs  Wt 176.0, Wt change -2.4 lb, Ht 65, BMI 29.28, Temp 98.1, Pulse sitting 91, BP sitting 158/90.    Examination  Gastroenterology::        GENERAL APPEARANCE: Well developed, well nourished, no active distress, pleasant.         SCLERA: anicteric.         CARDIOVASCULAR Normal RRR .         RESPIRATORY Breath sounds normal. Respiration even and unlabored.         ABDOMEN No masses palpated. Liver and spleen not palpated, normal. Bowel sounds normal, Abdomen not distended.         EXTREMITIES: No edema.         NEURO: alert, oriented to time, place and person,  uses a walker.         PSYCH: mood/affect normal.       Assessments     1. Frequent fecal incontinence -  R15.9 (Primary)  2. History of adenomatous polyp of colon - Z86.010  3. On clopidogrel therapy - Z79.01     Treatment   1. Frequent fecal incontinence   Notes: ?Overflow incontinence. I have advised the patient to take benefiber 1 tablespoon in 8 oz of water twice a day to evaluate if that helps. He may benefit from random colon biopsies to r/o micoscopic colitis.      2. History of adenomatous polyp of colon         IMAGING: Colonoscopy              Powell,Amy 02/08/2020 11:47:57 AM > Pt scheduled for Colon [facility] 12/27 - gave instructions, rx, consents and bill of rights. Messaged Marilynne Drivers for plavix hold.   Notes: The risks and the benefits of the procedure were discussed with the patient in details. He understands and verbalizes consent. He will be given written instructions, prescription for preparation and will be scheduled for the same. Due to his comorbidities, recent history of dementia, stroke, recommend scheduling the procedure as an outpatient at [facility].       3. On clopidogrel therapy   Notes: Will get clerance from Nita Sickle to hold plavix/clopidogrel for at least 5 days before the procedure.

## 2020-02-15 NOTE — Brief Op Note (Signed)
02/15/2020  10:58 AM  PATIENT:  James Henderson  69 y.o. male  PRE-OPERATIVE DIAGNOSIS:  Hx of colon polyps  POST-OPERATIVE DIAGNOSIS:  Colonoscopy: 2 transverse polyps; 4 ascending polyps; random biopsies to r/o microscopic colitis; 1 descending polyp   PROCEDURE:  Procedure(s): COLONOSCOPY WITH PROPOFOL (N/A) POLYPECTOMY BIOPSY  SURGEON:  Surgeon(s) and Role:    Ronnette Juniper, MD - Primary  PHYSICIAN ASSISTANT:   ASSISTANTS: Delman Kitten, Tech   ANESTHESIA:   MAC  EBL:  Minimal  BLOOD ADMINISTERED:none  DRAINS: none   LOCAL MEDICATIONS USED:  NONE  SPECIMEN:  Biopsy / Limited Resection  DISPOSITION OF SPECIMEN:  PATHOLOGY  COUNTS:  YES  TOURNIQUET:  * No tourniquets in log *  DICTATION: .Dragon Dictation  PLAN OF CARE: Discharge to home after PACU  PATIENT DISPOSITION:  PACU - hemodynamically stable.   Delay start of Pharmacological VTE agent (>24hrs) due to surgical blood loss or risk of bleeding: yes

## 2020-02-16 ENCOUNTER — Encounter (HOSPITAL_COMMUNITY): Payer: Self-pay | Admitting: Gastroenterology

## 2020-02-16 LAB — SURGICAL PATHOLOGY

## 2020-02-18 DIAGNOSIS — I10 Essential (primary) hypertension: Secondary | ICD-10-CM | POA: Diagnosis not present

## 2020-02-22 ENCOUNTER — Other Ambulatory Visit: Payer: Self-pay | Admitting: Neurology

## 2020-02-22 ENCOUNTER — Telehealth: Payer: Self-pay | Admitting: Neurology

## 2020-02-23 DIAGNOSIS — R3121 Asymptomatic microscopic hematuria: Secondary | ICD-10-CM | POA: Diagnosis not present

## 2020-02-23 DIAGNOSIS — K429 Umbilical hernia without obstruction or gangrene: Secondary | ICD-10-CM | POA: Diagnosis not present

## 2020-02-23 DIAGNOSIS — I700 Atherosclerosis of aorta: Secondary | ICD-10-CM | POA: Diagnosis not present

## 2020-02-23 DIAGNOSIS — R3129 Other microscopic hematuria: Secondary | ICD-10-CM | POA: Diagnosis not present

## 2020-02-23 DIAGNOSIS — N3289 Other specified disorders of bladder: Secondary | ICD-10-CM | POA: Diagnosis not present

## 2020-02-26 ENCOUNTER — Other Ambulatory Visit: Payer: Self-pay

## 2020-02-27 ENCOUNTER — Ambulatory Visit
Admission: RE | Admit: 2020-02-27 | Discharge: 2020-02-27 | Disposition: A | Discharge status: Home / Self Care | Payer: HMO | Source: Ambulatory Visit | Attending: Neurology | Admitting: Neurology

## 2020-02-27 ENCOUNTER — Other Ambulatory Visit: Payer: Self-pay

## 2020-02-27 DIAGNOSIS — G309 Alzheimer's disease, unspecified: Secondary | ICD-10-CM

## 2020-02-27 DIAGNOSIS — F0150 Vascular dementia without behavioral disturbance: Secondary | ICD-10-CM

## 2020-02-27 DIAGNOSIS — I639 Cerebral infarction, unspecified: Secondary | ICD-10-CM | POA: Diagnosis not present

## 2020-02-27 MED ORDER — GADOBENATE DIMEGLUMINE 529 MG/ML IV SOLN
16.0000 mL | Freq: Once | INTRAVENOUS | Status: AC | PRN
  Administered 2020-02-27: 16 mL via INTRAVENOUS

## 2020-02-29 ENCOUNTER — Telehealth: Payer: Self-pay | Admitting: *Deleted

## 2020-02-29 NOTE — Telephone Encounter (Addendum)
Attempted to reach Mr. James Henderson to call us back so we may give him his test results.  ----- Message from Pieter Partridge, DO sent at 02/29/2020  9:19 AM EST ----- MRI of brain shows extensive vascular changes due to stroke risk factors.  There is evidence of a very tiny stroke, which is an incidental finding.  He is already on Plavix so there is no change in blood thinner indicated.  His PCP should continue optimizing management of stroke risk factors such as blood pressure and cholesterol

## 2020-03-17 DIAGNOSIS — N401 Enlarged prostate with lower urinary tract symptoms: Secondary | ICD-10-CM | POA: Diagnosis not present

## 2020-03-17 DIAGNOSIS — R3121 Asymptomatic microscopic hematuria: Secondary | ICD-10-CM | POA: Diagnosis not present

## 2020-03-17 DIAGNOSIS — R3915 Urgency of urination: Secondary | ICD-10-CM | POA: Diagnosis not present

## 2020-03-31 ENCOUNTER — Ambulatory Visit: Payer: HMO | Admitting: Neurology

## 2020-04-01 DIAGNOSIS — N184 Chronic kidney disease, stage 4 (severe): Secondary | ICD-10-CM | POA: Diagnosis not present

## 2020-04-01 DIAGNOSIS — E7800 Pure hypercholesterolemia, unspecified: Secondary | ICD-10-CM | POA: Diagnosis not present

## 2020-04-01 DIAGNOSIS — E1122 Type 2 diabetes mellitus with diabetic chronic kidney disease: Secondary | ICD-10-CM | POA: Diagnosis not present

## 2020-04-01 DIAGNOSIS — E119 Type 2 diabetes mellitus without complications: Secondary | ICD-10-CM | POA: Diagnosis not present

## 2020-04-01 DIAGNOSIS — H409 Unspecified glaucoma: Secondary | ICD-10-CM | POA: Diagnosis not present

## 2020-04-01 DIAGNOSIS — I10 Essential (primary) hypertension: Secondary | ICD-10-CM | POA: Diagnosis not present

## 2020-04-01 DIAGNOSIS — N401 Enlarged prostate with lower urinary tract symptoms: Secondary | ICD-10-CM | POA: Diagnosis not present

## 2020-04-01 DIAGNOSIS — I639 Cerebral infarction, unspecified: Secondary | ICD-10-CM | POA: Diagnosis not present

## 2020-04-14 ENCOUNTER — Ambulatory Visit: Payer: Self-pay

## 2020-04-25 DIAGNOSIS — I639 Cerebral infarction, unspecified: Secondary | ICD-10-CM | POA: Diagnosis not present

## 2020-04-25 DIAGNOSIS — N183 Chronic kidney disease, stage 3 unspecified: Secondary | ICD-10-CM | POA: Diagnosis not present

## 2020-04-25 DIAGNOSIS — E1122 Type 2 diabetes mellitus with diabetic chronic kidney disease: Secondary | ICD-10-CM | POA: Diagnosis not present

## 2020-04-25 DIAGNOSIS — H409 Unspecified glaucoma: Secondary | ICD-10-CM | POA: Diagnosis not present

## 2020-04-25 DIAGNOSIS — N182 Chronic kidney disease, stage 2 (mild): Secondary | ICD-10-CM | POA: Diagnosis not present

## 2020-04-25 DIAGNOSIS — E7800 Pure hypercholesterolemia, unspecified: Secondary | ICD-10-CM | POA: Diagnosis not present

## 2020-04-25 DIAGNOSIS — N184 Chronic kidney disease, stage 4 (severe): Secondary | ICD-10-CM | POA: Diagnosis not present

## 2020-04-25 DIAGNOSIS — I10 Essential (primary) hypertension: Secondary | ICD-10-CM | POA: Diagnosis not present

## 2020-04-25 DIAGNOSIS — N401 Enlarged prostate with lower urinary tract symptoms: Secondary | ICD-10-CM | POA: Diagnosis not present

## 2020-04-25 DIAGNOSIS — E119 Type 2 diabetes mellitus without complications: Secondary | ICD-10-CM | POA: Diagnosis not present

## 2020-09-24 ENCOUNTER — Observation Stay (HOSPITAL_COMMUNITY)
Admission: EM | Admit: 2020-09-24 | Discharge: 2020-09-26 | Disposition: A | Discharge status: Home / Self Care | Payer: HMO | Attending: Internal Medicine | Admitting: Internal Medicine

## 2020-09-24 ENCOUNTER — Emergency Department (HOSPITAL_COMMUNITY): Payer: HMO

## 2020-09-24 ENCOUNTER — Encounter (HOSPITAL_COMMUNITY): Payer: Self-pay | Admitting: Emergency Medicine

## 2020-09-24 ENCOUNTER — Other Ambulatory Visit: Payer: Self-pay

## 2020-09-24 DIAGNOSIS — R531 Weakness: Secondary | ICD-10-CM | POA: Diagnosis not present

## 2020-09-24 DIAGNOSIS — I129 Hypertensive chronic kidney disease with stage 1 through stage 4 chronic kidney disease, or unspecified chronic kidney disease: Secondary | ICD-10-CM | POA: Diagnosis not present

## 2020-09-24 DIAGNOSIS — Z7984 Long term (current) use of oral hypoglycemic drugs: Secondary | ICD-10-CM | POA: Diagnosis not present

## 2020-09-24 DIAGNOSIS — I10 Essential (primary) hypertension: Secondary | ICD-10-CM | POA: Diagnosis not present

## 2020-09-24 DIAGNOSIS — N179 Acute kidney failure, unspecified: Secondary | ICD-10-CM | POA: Diagnosis present

## 2020-09-24 DIAGNOSIS — Z87891 Personal history of nicotine dependence: Secondary | ICD-10-CM | POA: Insufficient documentation

## 2020-09-24 DIAGNOSIS — Z79899 Other long term (current) drug therapy: Secondary | ICD-10-CM | POA: Insufficient documentation

## 2020-09-24 DIAGNOSIS — R29818 Other symptoms and signs involving the nervous system: Secondary | ICD-10-CM | POA: Diagnosis not present

## 2020-09-24 DIAGNOSIS — N1831 Chronic kidney disease, stage 3a: Secondary | ICD-10-CM | POA: Diagnosis not present

## 2020-09-24 DIAGNOSIS — E119 Type 2 diabetes mellitus without complications: Secondary | ICD-10-CM

## 2020-09-24 DIAGNOSIS — I6602 Occlusion and stenosis of left middle cerebral artery: Secondary | ICD-10-CM | POA: Diagnosis not present

## 2020-09-24 DIAGNOSIS — Z20822 Contact with and (suspected) exposure to covid-19: Secondary | ICD-10-CM | POA: Insufficient documentation

## 2020-09-24 DIAGNOSIS — Z0389 Encounter for observation for other suspected diseases and conditions ruled out: Secondary | ICD-10-CM | POA: Diagnosis not present

## 2020-09-24 DIAGNOSIS — E1122 Type 2 diabetes mellitus with diabetic chronic kidney disease: Secondary | ICD-10-CM | POA: Insufficient documentation

## 2020-09-24 DIAGNOSIS — E785 Hyperlipidemia, unspecified: Secondary | ICD-10-CM | POA: Diagnosis present

## 2020-09-24 DIAGNOSIS — R296 Repeated falls: Secondary | ICD-10-CM | POA: Diagnosis not present

## 2020-09-24 LAB — URINALYSIS, ROUTINE W REFLEX MICROSCOPIC
Bacteria, UA: NONE SEEN
Bilirubin Urine: NEGATIVE
Glucose, UA: NEGATIVE mg/dL
Hgb urine dipstick: NEGATIVE
Ketones, ur: NEGATIVE mg/dL
Leukocytes,Ua: NEGATIVE
Nitrite: NEGATIVE
Protein, ur: 30 mg/dL — AB
Specific Gravity, Urine: 1.017 (ref 1.005–1.030)
pH: 5 (ref 5.0–8.0)

## 2020-09-24 LAB — COMPREHENSIVE METABOLIC PANEL
ALT: 12 U/L (ref 0–44)
AST: 19 U/L (ref 15–41)
Albumin: 3.9 g/dL (ref 3.5–5.0)
Alkaline Phosphatase: 110 U/L (ref 38–126)
Anion gap: 9 (ref 5–15)
BUN: 14 mg/dL (ref 8–23)
CO2: 23 mmol/L (ref 22–32)
Calcium: 9.5 mg/dL (ref 8.9–10.3)
Chloride: 108 mmol/L (ref 98–111)
Creatinine, Ser: 1.68 mg/dL — ABNORMAL HIGH (ref 0.61–1.24)
GFR, Estimated: 43 mL/min — ABNORMAL LOW (ref >60)
Glucose, Bld: 158 mg/dL — ABNORMAL HIGH (ref 70–99)
Potassium: 4 mmol/L (ref 3.5–5.1)
Sodium: 140 mmol/L (ref 135–145)
Total Bilirubin: 1.1 mg/dL (ref 0.3–1.2)
Total Protein: 7.1 g/dL (ref 6.5–8.1)

## 2020-09-24 LAB — CBC
HCT: 40.6 % (ref 39.0–52.0)
Hemoglobin: 13.3 g/dL (ref 13.0–17.0)
MCH: 30.9 pg (ref 26.0–34.0)
MCHC: 32.8 g/dL (ref 30.0–36.0)
MCV: 94.2 fL (ref 80.0–100.0)
Platelets: 212 10*3/uL (ref 150–400)
RBC: 4.31 MIL/uL (ref 4.22–5.81)
RDW: 13.3 % (ref 11.5–15.5)
WBC: 5.9 10*3/uL (ref 4.0–10.5)
nRBC: 0 % (ref 0.0–0.2)

## 2020-09-24 LAB — I-STAT CHEM 8, ED
BUN: 15 mg/dL (ref 8–23)
Calcium, Ion: 1.14 mmol/L — ABNORMAL LOW (ref 1.15–1.40)
Chloride: 108 mmol/L (ref 98–111)
Creatinine, Ser: 1.8 mg/dL — ABNORMAL HIGH (ref 0.61–1.24)
Glucose, Bld: 156 mg/dL — ABNORMAL HIGH (ref 70–99)
HCT: 40 % (ref 39.0–52.0)
Hemoglobin: 13.6 g/dL (ref 13.0–17.0)
Potassium: 3.9 mmol/L (ref 3.5–5.1)
Sodium: 142 mmol/L (ref 135–145)
TCO2: 23 mmol/L (ref 22–32)

## 2020-09-24 LAB — DIFFERENTIAL
Abs Immature Granulocytes: 0.01 10*3/uL (ref 0.00–0.07)
Basophils Absolute: 0 10*3/uL (ref 0.0–0.1)
Basophils Relative: 0 %
Eosinophils Absolute: 0.1 10*3/uL (ref 0.0–0.5)
Eosinophils Relative: 2 %
Immature Granulocytes: 0 %
Lymphocytes Relative: 14 %
Lymphs Abs: 0.8 10*3/uL (ref 0.7–4.0)
Monocytes Absolute: 0.4 10*3/uL (ref 0.1–1.0)
Monocytes Relative: 7 %
Neutro Abs: 4.6 10*3/uL (ref 1.7–7.7)
Neutrophils Relative %: 77 %

## 2020-09-24 LAB — PROTIME-INR
INR: 1 (ref 0.8–1.2)
Prothrombin Time: 13.4 seconds (ref 11.4–15.2)

## 2020-09-24 LAB — CBG MONITORING, ED: Glucose-Capillary: 140 mg/dL — ABNORMAL HIGH (ref 70–99)

## 2020-09-24 LAB — APTT: aPTT: 26 seconds (ref 24–36)

## 2020-09-24 LAB — RESP PANEL BY RT-PCR (FLU A&B, COVID) ARPGX2
Influenza A by PCR: NEGATIVE
Influenza B by PCR: NEGATIVE
SARS Coronavirus 2 by RT PCR: NEGATIVE

## 2020-09-24 MED ORDER — SODIUM CHLORIDE 0.9 % IV BOLUS
1000.0000 mL | Freq: Once | INTRAVENOUS | Status: AC
  Administered 2020-09-24: 1000 mL via INTRAVENOUS

## 2020-09-24 MED ORDER — SODIUM CHLORIDE 0.9% FLUSH: 3.0000 mL | Freq: Once | INTRAVENOUS | Status: DC

## 2020-09-24 NOTE — Consult Note (Signed)
Neurology Consultation Reason for Consult: Worsening left-sided weakness Referring Physician: Darl Householder, D  CC: Left-sided weakness  History is obtained from: EMS, chart review, patient's daughter  HPI: James Henderson is a 70 y.o. male with a history of previous stroke and meningioma who presents with left-sided weakness that has been worse over the past couple of days.  He has some left-sided weakness at baseline, but over the past 2 days he has been more confused and has been falling.  Family denies any signs of seizure or episodes of loss of awareness.  Due to the symptoms, he became too much for his wife to take care of and EMS was called to transport him to the hospital.  They felt that he had some worsening while they were seeing him and therefore activated a code stroke.  LKW: 2 days ago tpa given?: no, outside of window   ROS: A 14 point ROS was performed and is negative except as noted in the HPI.  Past Medical History:  Diagnosis Date   Brainstem stroke ([place]) 2015   Cerebral meningioma ([place])    Diabetes mellitus without complication ([place])    Glaucoma    Hyperlipidemia    Hypertension      Family History  Problem Relation Age of Onset   Stroke Mother    Stroke Brother    Diabetes type II Brother      Social History:  reports that he quit smoking about 22 years ago. He has never used smokeless tobacco. He reports that he does not drink alcohol and does not use drugs.   Exam: Current vital signs: BP (!) 157/111 (BP Location: Right Arm)   Pulse 74   Temp 97.8 F (36.6 C) (Temporal)   Resp 18   Ht 5\' 9"  (1.753 m)   Wt 79 kg   SpO2 100%   BMI 25.72 kg/m  Vital signs in last 24 hours: Temp:  [97.8 F (36.6 C)-98.6 F (37 C)] 97.8 F (36.6 C) (08/06 2311) Pulse Rate:  [74-86] 74 (08/06 2311) Resp:  [12-21] 18 (08/06 2311) BP: (137-157)/(72-111) 157/111 (08/06 2311) SpO2:  [96 %-100 %] 100 % (08/06 2311) Weight:  [79 kg] 79 kg (08/06 2057)   Physical Exam   Constitutional: Appears well-developed and well-nourished.  Psych: Affect appropriate to situation Eyes: No scleral injection HENT: No OP obstruction MSK: no joint deformities.  Cardiovascular: Normal rate and regular rhythm.  Respiratory: Effort normal, non-labored breathing GI: Soft.  No distension. There is no tenderness.  Skin: WDI  Neuro: Mental Status: Patient is awake, alert, oriented to person, place, month, year, and situation. Patient is able to give a clear and coherent history. No signs of eglect.  He has slowed deliberate speech Cranial Nerves: II: Visual Fields are full. Pupils are equal, round, and reactive to light.   III,IV, VI: EOMI without ptosis or diploplia.  V: Facial sensation is symmetric to temperature VII: Facial movement with left facial weakness VIII: hearing is intact to voice X: Uvula elevates symmetrically XI: Shoulder shrug is symmetric. XII: tongue is midline without atrophy or fasciculations.  Motor: Tone is normal. Bulk is normal. 5/5 strength was present on the right, on the left he has mild 4/5 weakness of the left arm and leg Sensory: He reports reduced sensation in the RIGHT arm and leg Cerebellar: No clear ataxia     I have reviewed labs in epic and the results pertinent to this consultation are: Creatinine 1.8  I have reviewed the images  obtained: MRI brain-negative  Impression: 70 year old male with worsening weakness and confusion in the setting of AKI.  With negative MRI, I suspect that this is recrudescence of his previous stroke symptoms.  Given the strange transient worsening described by EMS, I think an EEG would be reasonable, but I suspect that this was more situational.   Recommendations: 1) EEG 2) Ammonia, UA 3) If negative, no further neurological workup at this time.    Roland Rack, MD [facility] [phone]  If 7pm- 7am, please page neurology on call as listed in [place].

## 2020-09-24 NOTE — Code Documentation (Signed)
Stroke Response Nurse Documentation Code Documentation  James Henderson is a 70 y.o. male arriving to [facility] ED via [facility] on 8/6 with past medical hx of HLD, DM2, prior CVA with left sided weakness. Code stroke was activated by EMS. Patient from home where he was LKW at 2 days ago and now complaining of worsening left sided weakness . On clopidogrel 75 mg daily. Stroke team at the bedside on patient arrival. Labs drawn and patient cleared for CT by Dr. Darl Householder. Patient to CT with team. NIHSS 2, see documentation for details and code stroke times. Patient with left facial droop and right decreased sensation on exam. The following imaging was completed:  CT. Patient is not a candidate for tPA due to Out of window. Bedside handoff with ED RN Mortimer Fries.    Madelynn Done  Rapid Response RN

## 2020-09-24 NOTE — ED Notes (Signed)
Patient transported to MRI 

## 2020-09-24 NOTE — ED Triage Notes (Signed)
Patient arrived with EMS from home , LSN 3 days ago , family noticed increasing weakness at left side of body , left facial droop and aphasia , CBG= 148 , evaluated by EDP/neurologist at arrival , transported to CT scan with rapid response RN .

## 2020-09-24 NOTE — ED Provider Notes (Signed)
[place] EMERGENCY DEPARTMENT Provider Note   CSN: [id number] Arrival date & time: 09/24/20  2034  An emergency department physician performed an initial assessment on this suspected stroke patient at 2035.  History Chief Complaint  Patient presents with   Code Stroke    James Henderson is a 70 y.o. male history of cerebral meningioma, brainstem stroke, diabetes, hypertension, here presenting with worsening weakness.  Patient has been falling for the last several days.  He has fell in the bathroom and unable to get up.  Patient was noted to have left-sided weakness for the last 2 days.  Patient has a previous stroke and apparently is ambulatory at baseline.   The history is provided by the patient and the spouse.      Past Medical History:  Diagnosis Date   Brainstem stroke ([place]) 2015   Cerebral meningioma ([place])    Diabetes mellitus without complication ([place])    Glaucoma    Hyperlipidemia    Hypertension     Patient Active Problem List   Diagnosis Date Noted   Diabetes mellitus type 2 in nonobese ([place])    Benign essential HTN    History of CVA (cerebrovascular accident)    Acute blood loss anemia    Stage 3 chronic kidney disease ([place])    Acute CVA (cerebrovascular accident) ([place]) 10/21/2017   HLD (hyperlipidemia) 01/10/2014   Acute brainstem infarction ([place]) 12/07/2013   Dizziness 12/06/2013   DM (diabetes mellitus) type II controlled, neurological manifestation ([place]) 12/06/2013   HYPERLIPIDEMIA 02/26/2006   GLAUCOMA NOS 02/26/2006   Essential hypertension 02/26/2006    Past Surgical History:  Procedure Laterality Date   BIOPSY  02/15/2020   Procedure: BIOPSY;  Surgeon: Ronnette Juniper, MD;  Location: [facility];  Service: Gastroenterology;;   COLONOSCOPY WITH PROPOFOL N/A 02/15/2020   Procedure: COLONOSCOPY WITH PROPOFOL;  Surgeon: Ronnette Juniper, MD;  Location: [facility];  Service: Gastroenterology;  Laterality: N/A;   EYE SURGERY      POLYPECTOMY  02/15/2020   Procedure: POLYPECTOMY;  Surgeon: Ronnette Juniper, MD;  Location: [facility];  Service: Gastroenterology;;       Family History  Problem Relation Age of Onset   Stroke Mother    Stroke Brother    Diabetes type II Brother     Social History   Tobacco Use   Smoking status: Former    Types: Cigarettes    Quit date: 2000    Years since quitting: 22.6   Smokeless tobacco: Never  Vaping Use   Vaping Use: Never used  Substance Use Topics   Alcohol use: No    Alcohol/week: 0.0 standard drinks   Drug use: No    Home Medications Prior to Admission medications   Medication Sig Start Date End Date Taking? Authorizing Provider  acetaminophen (TYLENOL) 500 MG tablet Take 1,000 mg by mouth every 6 (six) hours as needed for headache (pain).    [provider]  amLODipine (NORVASC) 5 MG tablet Take 5 mg by mouth daily. 02/01/20   [provider]  Brinzolamide-Brimonidine 1-0.2 % SUSP Place 1 drop into both eyes 2 (two) times daily.     [provider]  Cholecalciferol (VITAMIN D-3) 125 MCG (5000 UT) TABS Take 5,000 Units by mouth daily.    [provider]  clopidogrel (PLAVIX) 75 MG tablet Take 1 tablet (75 mg total) by mouth daily. 10/25/17   Purohit, Konrad Dolores, MD  donepezil (ARICEPT) 10 MG tablet Take 1 tablet (10 mg total)  by mouth at bedtime. 02/22/20   Pieter Partridge, DO  ezetimibe (ZETIA) 10 MG tablet Take 10 mg by mouth daily. 01/26/20   [provider]  glipiZIDE (GLUCOTROL) 5 MG tablet Take 5 mg by mouth daily.    [provider]  Inositol Niacinate 500 MG CAPS Take 500 mg by mouth every evening.    [provider]  losartan (COZAAR) 100 MG tablet Take 100 mg by mouth daily. 08/16/17   [provider]  metFORMIN (GLUCOPHAGE) 1000 MG tablet Take 500-1,000 mg by mouth See admin instructions. Take1000 mg in the morning and 500 mg in the evening 08/16/17   [provider]  Omega-3 Fatty  Acids (FISH OIL) 500 MG CAPS Take 500 mg by mouth every evening.    [provider]  rosuvastatin (CRESTOR) 40 MG tablet Take 1 tablet (40 mg total) by mouth daily at 6 PM. Patient taking differently: Take 40 mg by mouth daily. 10/24/17   Purohit, Konrad Dolores, MD  Vibegron (GEMTESA) 75 MG TABS Take 75 mg by mouth daily.    [provider]    Allergies    Patient has no known allergies.  Review of Systems   Review of Systems  Neurological:  Positive for weakness.  All other systems reviewed and are negative.  Physical Exam Updated Vital Signs BP (!) 148/94 (BP Location: Right Arm)   Pulse 82   Temp 98.6 F (37 C) (Temporal)   Resp 20   Ht 5\' 9"  (1.753 m)   Wt 79 kg   SpO2 99%   BMI 25.72 kg/m   Physical Exam Vitals and nursing note reviewed.  Constitutional:      Comments: Chronically ill-appearing.  HENT:     Head: Normocephalic.     Nose: Nose normal.     Mouth/Throat:     Mouth: Mucous membranes are moist.  Eyes:     Extraocular Movements: Extraocular movements intact.     Pupils: Pupils are equal, round, and reactive to light.  Cardiovascular:     Rate and Rhythm: Normal rate and regular rhythm.     Pulses: Normal pulses.     Heart sounds: Normal heart sounds.  Pulmonary:     Effort: Pulmonary effort is normal.     Breath sounds: Normal breath sounds.  Abdominal:     General: Abdomen is flat.     Palpations: Abdomen is soft.  Musculoskeletal:        General: Normal range of motion.     Cervical back: Normal range of motion and neck supple.  Skin:    General: Skin is warm.     Capillary Refill: Capillary refill takes less than 2 seconds.  Neurological:     Comments: Strength 4/5 L arm and leg   Psychiatric:        Mood and Affect: Mood normal.        Behavior: Behavior normal.    ED Results / Procedures / Treatments   Labs (all labs ordered are listed, but only abnormal results are displayed) Labs Reviewed  CBG MONITORING, ED - Abnormal;  Notable for the following components:      Result Value   Glucose-Capillary 140 (*)    All other components within normal limits  I-STAT CHEM 8, ED - Abnormal; Notable for the following components:   Creatinine, Ser 1.80 (*)    Glucose, Bld 156 (*)    Calcium, Ion 1.14 (*)    All other components within normal  limits  RESP PANEL BY RT-PCR (FLU A&B, COVID) ARPGX2  PROTIME-INR  APTT  CBC  DIFFERENTIAL  COMPREHENSIVE METABOLIC PANEL  CBG MONITORING, ED    EKG EKG Interpretation  Date/Time:  Saturday September 24 2020 20:57:37 EDT Ventricular Rate:  82 PR Interval:  124 QRS Duration: 92 QT Interval:  327 QTC Calculation: 382 R Axis:   55 Text Interpretation: Sinus rhythm Borderline T wave abnormalities Minimal ST elevation, anterior leads No significant change since last tracing Confirmed by Wandra Arthurs [phone]) on 09/24/2020 9:04:31 PM  Radiology CT HEAD CODE STROKE WO CONTRAST  Result Date: 09/24/2020 CLINICAL DATA:  Code stroke.  Neuro deficit, acute, stroke suspected EXAM: CT HEAD WITHOUT CONTRAST TECHNIQUE: Contiguous axial images were obtained from the base of the skull through the vertex without intravenous contrast. COMPARISON:  None. FINDINGS: Brain: No intracranial hemorrhage. Small left posterior fossa meningioma is unchanged. There is generalized atrophy without lobar predilection. There is hypoattenuation of the periventricular white matter, most commonly indicating chronic ischemic microangiopathy. Vascular: No abnormal hyperdensity of the major intracranial arteries or dural venous sinuses. No intracranial atherosclerosis. Skull: The visualized skull base, calvarium and extracranial soft tissues are normal. Sinuses/Orbits: No fluid levels or advanced mucosal thickening of the visualized paranasal sinuses. No mastoid or middle ear effusion. The orbits are normal. ASPECTS [facility] Stroke Program Early CT Score) - Ganglionic level infarction (caudate, lentiform nuclei, internal  capsule, insula, M1-M3 cortex): 7 - Supraganglionic infarction (M4-M6 cortex): 3 Total score (0-10 with 10 being normal): 10 IMPRESSION: 1. No acute intracranial abnormality. 2. ASPECTS is 10. 3. Unchanged small left posterior fossa meningioma. These results were communicated to Dr. Roland Rack at 9:04 pm on 09/24/2020 by text page via the [facility] messaging system. Electronically Signed   By: Ulyses Jarred M.D.   On: 09/24/2020 21:04    Procedures Procedures   Medications Ordered in ED Medications  sodium chloride flush (NS) 0.9 % injection 3 mL (3 mLs Intravenous Not Given 09/24/20 2057)    ED Course  I have reviewed the triage vital signs and the nursing notes.  Pertinent labs & imaging results that were available during my care of the patient were reviewed by me and considered in my medical decision making (see chart for details).    MDM Rules/Calculators/A&P                          Willies Ellerby is a 70 y.o. male here with left-sided weakness.  This been going on for about 2 days.  Patient apparently was unable to get up after being on the commode today.  Code stroke was activated by EMS.  Patient does have left-sided weakness. He is outside the window for tPA or IR intervention.  Plan to get CBC and CMP and CT head and MRI brain and MRA brain.  11:10 PM Patient has AKI and creatinine is 1.8. Baseline is 1.3. Patient is still unable to get up and walk.  MRI showed multiple chronic microhemorrhages.  This is consistent with amyloid angiopathy.  At this point will admit patient for hydration.     Final Clinical Impression(s) / ED Diagnoses Final diagnoses:  None    Rx / DC Orders ED Discharge Orders     None        Drenda Freeze, MD 09/24/20 2316

## 2020-09-25 ENCOUNTER — Encounter (HOSPITAL_COMMUNITY): Payer: Self-pay | Admitting: Internal Medicine

## 2020-09-25 ENCOUNTER — Observation Stay (HOSPITAL_COMMUNITY): Payer: HMO

## 2020-09-25 DIAGNOSIS — N179 Acute kidney failure, unspecified: Secondary | ICD-10-CM

## 2020-09-25 DIAGNOSIS — R531 Weakness: Secondary | ICD-10-CM

## 2020-09-25 LAB — GLUCOSE, CAPILLARY
Glucose-Capillary: 92 mg/dL (ref 70–99)
Glucose-Capillary: 145 mg/dL — ABNORMAL HIGH (ref 70–99)
Glucose-Capillary: 124 mg/dL — ABNORMAL HIGH (ref 70–99)

## 2020-09-25 LAB — HEMOGLOBIN A1C
Hgb A1c MFr Bld: 7 % — ABNORMAL HIGH (ref 4.8–5.6)
Mean Plasma Glucose: 154.2 mg/dL

## 2020-09-25 LAB — BASIC METABOLIC PANEL
Anion gap: 7 (ref 5–15)
BUN: 12 mg/dL (ref 8–23)
CO2: 25 mmol/L (ref 22–32)
Calcium: 9.1 mg/dL (ref 8.9–10.3)
Chloride: 109 mmol/L (ref 98–111)
Creatinine, Ser: 1.63 mg/dL — ABNORMAL HIGH (ref 0.61–1.24)
GFR, Estimated: 44 mL/min — ABNORMAL LOW (ref >60)
Glucose, Bld: 121 mg/dL — ABNORMAL HIGH (ref 70–99)
Potassium: 3.9 mmol/L (ref 3.5–5.1)
Sodium: 141 mmol/L (ref 135–145)

## 2020-09-25 LAB — CBG MONITORING, ED: Glucose-Capillary: 131 mg/dL — ABNORMAL HIGH (ref 70–99)

## 2020-09-25 LAB — AMMONIA: Ammonia: 12 umol/L (ref 9–35)

## 2020-09-25 MED ORDER — VIBEGRON 75 MG PO TABS: 75.0000 mg | ORAL_TABLET | Freq: Every day | ORAL | Status: DC

## 2020-09-25 MED ORDER — SODIUM CHLORIDE 0.9 % IV SOLN: INTRAVENOUS | Status: AC

## 2020-09-25 MED ORDER — ENOXAPARIN SODIUM 40 MG/0.4ML IJ SOSY
40.0000 mg | PREFILLED_SYRINGE | INTRAMUSCULAR | Status: DC
  Administered 2020-09-25 – 2020-09-26 (×2): 40 mg via SUBCUTANEOUS
  Filled 2020-09-25 (×2): qty 0.4

## 2020-09-25 MED ORDER — ACETAMINOPHEN 650 MG RE SUPP: 650.0000 mg | Freq: Four times a day (QID) | RECTAL | Status: DC | PRN

## 2020-09-25 MED ORDER — INSULIN ASPART 100 UNIT/ML IJ SOLN: 0.0000 [IU] | Freq: Every day | INTRAMUSCULAR | Status: DC

## 2020-09-25 MED ORDER — ACETAMINOPHEN 325 MG PO TABS: 650.0000 mg | ORAL_TABLET | Freq: Four times a day (QID) | ORAL | Status: DC | PRN

## 2020-09-25 MED ORDER — CLOPIDOGREL BISULFATE 75 MG PO TABS
75.0000 mg | ORAL_TABLET | Freq: Every day | ORAL | Status: DC
  Administered 2020-09-25 – 2020-09-26 (×2): 75 mg via ORAL
  Filled 2020-09-25 (×2): qty 1

## 2020-09-25 MED ORDER — EZETIMIBE 10 MG PO TABS
10.0000 mg | ORAL_TABLET | Freq: Every day | ORAL | Status: DC
  Administered 2020-09-25 – 2020-09-26 (×2): 10 mg via ORAL
  Filled 2020-09-25: qty 1

## 2020-09-25 MED ORDER — ROSUVASTATIN CALCIUM 20 MG PO TABS
40.0000 mg | ORAL_TABLET | Freq: Every day | ORAL | Status: DC
  Administered 2020-09-25: 40 mg via ORAL
  Filled 2020-09-25: qty 2

## 2020-09-25 MED ORDER — AMLODIPINE BESYLATE 5 MG PO TABS
5.0000 mg | ORAL_TABLET | Freq: Every day | ORAL | Status: DC
  Administered 2020-09-25 – 2020-09-26 (×2): 5 mg via ORAL
  Filled 2020-09-25 (×2): qty 1

## 2020-09-25 MED ORDER — DONEPEZIL HCL 10 MG PO TABS
10.0000 mg | ORAL_TABLET | Freq: Every day | ORAL | Status: DC
  Administered 2020-09-25: 10 mg via ORAL
  Filled 2020-09-25 (×2): qty 1

## 2020-09-25 MED ORDER — FESOTERODINE FUMARATE ER 8 MG PO TB24
8.0000 mg | ORAL_TABLET | Freq: Every day | ORAL | Status: DC
  Administered 2020-09-25 – 2020-09-26 (×2): 8 mg via ORAL
  Filled 2020-09-25 (×2): qty 1

## 2020-09-25 MED ORDER — INSULIN ASPART 100 UNIT/ML IJ SOLN
0.0000 [IU] | Freq: Three times a day (TID) | INTRAMUSCULAR | Status: DC
  Administered 2020-09-25 – 2020-09-26 (×4): 1 [IU] via SUBCUTANEOUS

## 2020-09-25 NOTE — Evaluation (Signed)
Occupational Therapy Evaluation Patient Details Name: James Henderson MRN: [id number] DOB: [birth year] Today's Date: 09/25/2020    History of Present Illness Pt is a 70 y.o. male who presented 09/24/20 with L-sided weakness and AMS. Head CT and brain MRI negative for acute stroke.  MRA head/neck negative for LVO. Pt found to have AKI on CKD stage II-IIIa, and felt weakness due to recrudescence of stroke symptoms  PMH: dementia, brainstem stroke, meningioma, non-insulin-dependent type II diabetes, glaucoma, hypertension, hyperlipidemia, CKD stage II-IIIa   Clinical Impression   Pt admitted with above. He demonstrates the below listed deficits and will benefit from continued OT to maximize safety and independence with BADLs.  Pt presents to OT with Lt sided weakness and incoordination, impaired balance, and impaired cognition.  He currently requires up to min - max A for ADLs and mod A for functional mobility. PTA, he lived with his wife, and per chart was doing ADLs with supervision - mod I.  He is at risk for falls.  Recommend Short SNF stay to allow him to maximize his safety and independence to return home safely with wife's assist.  If he progresses quicker than anticipated, he may be able to go home with 24 hour assist and HH therapies.      Follow Up Recommendations  SNF;Supervision/Assistance - 24 hour    Equipment Recommendations  3 in 1 bedside commode    Recommendations for Other Services       Precautions / Restrictions Precautions Precautions: Fall      Mobility Bed Mobility Overal bed mobility: Needs Assistance Bed Mobility: Supine to Sit;Sit to Supine     Supine to sit: Mod assist;HOB elevated Sit to supine: Mod assist   General bed mobility comments: required assist to move to edge of ED stretcher as he demonstrated difficulty lifitng trunk    Transfers                      Balance Overall balance assessment: Needs assistance Sitting-balance support: Feet  supported Sitting balance-Leahy Scale: Poor Sitting balance - Comments: initially required up to mod A, but progressed to close min guard assist with UE support                                   ADL either performed or assessed with clinical judgement   ADL Overall ADL's : Needs assistance/impaired Eating/Feeding: Minimal assistance;Bed level Eating/Feeding Details (indicate cue type and reason): assist for set up and intermittent assist due to spillage Grooming: Wash/dry hands;Wash/dry face;Oral care;Brushing hair;Minimal assistance;Bed level   Upper Body Bathing: Moderate assistance;Sitting   Lower Body Bathing: Maximal assistance;Sit to/from stand   Upper Body Dressing : Moderate assistance;Sitting   Lower Body Dressing: Maximal assistance;Sit to/from stand   Toilet Transfer: Moderate assistance;Stand-pivot;BSC   Toileting- Clothing Manipulation and Hygiene: Maximal assistance;Sit to/from stand Toileting - Clothing Manipulation Details (indicate cue type and reason): Pt incontinent of urine.  He was assisted with peri care and change of bed linens     Functional mobility during ADLs: Moderate assistance General ADL Comments: pt requires assist for balance, problem solving, sequencing, and safety     Vision   Additional Comments: not assessed this date     Perception Perception Perception Tested?: Yes   Praxis Praxis Praxis tested?: Within functional limits    Pertinent Vitals/Pain Pain Assessment: No/denies pain     Hand Dominance Right  Extremity/Trunk Assessment Upper Extremity Assessment Upper Extremity Assessment: LUE deficits/detail LUE Deficits / Details: Pt demonstrates full AROM of Lt UE, however, dysmetria noted, as well as incoordination.  He requires assist to open containers as he demonstrates difficulty manipulating and stabilizing objects in Lt hand LUE Coordination: decreased gross motor;decreased fine motor   Lower Extremity  Assessment Lower Extremity Assessment: Defer to PT evaluation   Cervical / Trunk Assessment Cervical / Trunk Assessment: Other exceptions Cervical / Trunk Exceptions: weakness Lt trunk noted.  Lt rib cage flared, and he does not isolate upper and lower trunk movements   Communication Communication Communication: Other (comment) (low volume, and slow to answer)   Cognition Arousal/Alertness: Awake/alert Behavior During Therapy: WFL for tasks assessed/performed Overall Cognitive Status: No family/caregiver present to determine baseline cognitive functioning Area of Impairment: Attention;Memory;Following commands;Safety/judgement;Awareness;Problem solving                   Current Attention Level: Sustained Memory: Decreased short-term memory Following Commands: Follows one step commands consistently;Follows one step commands with increased time Safety/Judgement: Decreased awareness of safety Awareness: Intellectual Problem Solving: Slow processing;Decreased initiation;Difficulty sequencing;Requires verbal cues General Comments: Pt very slow to process info.  he requires cues for problem solving and sequencing during self feeding   General Comments       Exercises     Shoulder Instructions      Home Living Family/patient expects to be discharged to:: Private residence Living Arrangements: Spouse/significant other Available Help at Discharge: Family;Available 24 hours/day Type of Home: House       Home Layout: One level     Bathroom Shower/Tub: Occupational psychologist: Standard         Additional Comments: info gleaned from chart as pt is not reliable historian.      Prior Functioning/Environment Level of Independence: Needs assistance  Gait / Transfers Assistance Needed: Per chart, and pt report, he was ambulating mod I.  Pt reports he used a quad cane.  Per chart, he has had increased weakness and falls leading up to presentation to ED ADL's /  Homemaking Assistance Needed: Pt reports he was performing ADLs mod I, however, wife not present to confirm            OT Problem List: Decreased strength;Decreased range of motion;Decreased activity tolerance;Impaired balance (sitting and/or standing);Decreased coordination;Decreased cognition;Decreased safety awareness;Decreased knowledge of use of DME or AE;Impaired UE functional use      OT Treatment/Interventions: Self-care/ADL training;Neuromuscular education;DME and/or AE instruction;Therapeutic activities;Cognitive remediation/compensation;Patient/family education;Balance training;Visual/perceptual remediation/compensation    OT Goals(Current goals can be found in the care plan section) Acute Rehab OT Goals Patient Stated Goal: did not state OT Goal Formulation: With patient Time For Goal Achievement: 10/09/20 Potential to Achieve Goals: Good ADL Goals Pt Will Perform Grooming: with min assist;standing Pt Will Perform Upper Body Bathing: with set-up;with supervision;sitting Pt Will Perform Lower Body Bathing: with min assist;sit to/from stand Pt Will Perform Upper Body Dressing: with supervision;with set-up;sitting Pt Will Perform Lower Body Dressing: with min assist;sit to/from stand Pt Will Transfer to Toilet: with min assist;ambulating;regular height toilet;bedside commode;grab bars Pt Will Perform Toileting - Clothing Manipulation and hygiene: with min assist;sit to/from stand  OT Frequency: Min 2X/week   Barriers to D/C:            Co-evaluation              AM-PAC OT "6 Clicks" Daily Activity     Outcome Measure Help from another  person eating meals?: A Little Help from another person taking care of personal grooming?: A Little Help from another person toileting, which includes using toliet, bedpan, or urinal?: A Lot Help from another person bathing (including washing, rinsing, drying)?: A Lot Help from another person to put on and taking off regular upper  body clothing?: A Lot Help from another person to put on and taking off regular lower body clothing?: A Lot 6 Click Score: 14   End of Session Nurse Communication: Mobility status  Activity Tolerance: Patient tolerated treatment well Patient left: in bed;Other (comment);with call bell/phone within reach (EEG tech in room)  OT Visit Diagnosis: Unsteadiness on feet (R26.81);Cognitive communication deficit (R41.841)                Time: 7416-3845 OT Time Calculation (min): 54 min Charges:  OT General Charges $OT Visit: 1 Visit OT Evaluation $OT Eval Moderate Complexity: 1 Mod OT Treatments $Self Care/Home Management : 38-52 mins  Nilsa Nutting., OTR/L Acute Rehabilitation Services Pager [phone] Office [phone]   Lucille Passy M 09/25/2020, 9:55 AM

## 2020-09-25 NOTE — Evaluation (Signed)
Physical Therapy Evaluation Patient Details Name: James Henderson MRN: [id number] DOB: 08/07/1950 Today's Date: 09/25/2020   History of Present Illness  Pt is a 70 y.o. male who presented 09/24/20 with L-sided weakness and AMS. Head CT and brain MRI negative for acute stroke.  MRA head/neck negative for LVO. Pt found to have AKI on CKD stage II-IIIa, and felt weakness due to recrudescence of stroke symptoms  PMH: dementia, brainstem stroke, meningioma, non-insulin-dependent type II diabetes, glaucoma, hypertension, hyperlipidemia, CKD stage II-IIIa   Clinical Impression  Pt presents with condition above and deficits mentioned below, see PT Problem List. Per pt, he was mod I with mobility, alternating using his quad-cane and rollator. He lives with his wife in a 1-level house with 3 STE without rails. Pt stating contradictory info compared to OT eval, thus pt is poor historian, will need to reconfirm PLOF and home set-up with family when present at a later date. Family not present during PT eval this date. Currently, pt displays L lower extremity weakness, incoordination, balance, expressive deficits, and cognitive deficits that impact his safety and independence with all functional mobility. He is at high risk for falls. Pt is requiring minA for transfers to stand and modA to ambulate bedroom distances with a RW this date. Will continue to follow acutely. At this time, recommending follow-up with therapies in a SNF setting for short-term rehab prior to return home to maximize his safety and independence with all functional mobility. If pt makes great progress though may be able to update to home with HHPT.    Follow Up Recommendations SNF;Home health PT;Supervision/Assistance - 24 hour (pending progress)    Equipment Recommendations  Rolling walker with 5" wheels    Recommendations for Other Services       Precautions / Restrictions Precautions Precautions: Fall Restrictions Weight Bearing  Restrictions: No      Mobility  Bed Mobility Overal bed mobility: Needs Assistance Bed Mobility: Supine to Sit;Sit to Supine     Supine to sit: Min guard Sit to supine: Min guard   General bed mobility comments: Min guard assist and excessive amount of time for pt to transition supine <> sit EOB with bed flat.    Transfers Overall transfer level: Needs assistance Equipment used: Rolling walker (2 wheeled) Transfers: Sit to/from Stand Sit to Stand: Min assist         General transfer comment: MinA to power up to stand, displaying instability and posterior lean initially.  Ambulation/Gait Ambulation/Gait assistance: Mod assist Gait Distance (Feet): 20 Feet Assistive device: Rolling walker (2 wheeled) Gait Pattern/deviations: Step-to pattern;Decreased step length - left;Decreased dorsiflexion - left;Decreased stride length;Shuffle;Trunk flexed;Drifts right/left Gait velocity: reduced Gait velocity interpretation: <1.31 ft/sec, indicative of household ambulator General Gait Details: Pt with L foot drag, needing cues to attend to it and lift it often. Poor RW management, pushing it distal and crossing it across his body often, needing repeated redirecting. ModA for stability and RW management, x1 LOB posteriorly, needing modA to recover.  Stairs            Wheelchair Mobility    Modified Rankin (Stroke Patients Only) Modified Rankin (Stroke Patients Only) Pre-Morbid Rankin Score: Slight disability Modified Rankin: Moderately severe disability     Balance Overall balance assessment: Needs assistance Sitting-balance support: Feet supported Sitting balance-Leahy Scale: Fair Sitting balance - Comments: Static sitting EOB with min guard assist.   Standing balance support: Bilateral upper extremity supported;During functional activity Standing balance-Leahy Scale: Poor Standing balance comment: Reliant  on UE support and min-modA for standing mobility.                              Pertinent Vitals/Pain Pain Assessment: Faces Faces Pain Scale: No hurt Pain Intervention(s): Monitored during session    Home Living Family/patient expects to be discharged to:: Private residence Living Arrangements: Spouse/significant other Available Help at Discharge: Family;Available 24 hours/day Type of Home: House Home Access: Stairs to enter Entrance Stairs-Rails: None Entrance Stairs-Number of Steps: 3 Home Layout: One level Home Equipment: Walker - 4 wheels;Bedside commode;Shower seat;Cane - quad Additional Comments: pt not reliable historian as he reported he had a walk-in shower to OT but later stated tub/shower to PT    Prior Function Level of Independence: Needs assistance   Gait / Transfers Assistance Needed: Pt reports being mod I alternating between using his quad-cane and rollator  ADL's / Homemaking Assistance Needed: Pt reports he was performing ADLs mod I, however, wife not present to confirm        Hand Dominance   Dominant Hand: Right    Extremity/Trunk Assessment   Upper Extremity Assessment Upper Extremity Assessment: Defer to OT evaluation    Lower Extremity Assessment Lower Extremity Assessment: LLE deficits/detail LLE Deficits / Details: MMT scores of 4+ to 5 grossly bil, but displays weakness in L leg with functional mobility, dragging the foot when walking; incoordination noted; denies numbness/tingling LLE Sensation: decreased proprioception LLE Coordination: decreased fine motor;decreased gross motor    Cervical / Trunk Assessment Cervical / Trunk Assessment: Normal  Communication   Communication: Other (comment);Expressive difficulties (slow to answer)  Cognition Arousal/Alertness: Awake/alert Behavior During Therapy: WFL for tasks assessed/performed Overall Cognitive Status: No family/caregiver present to determine baseline cognitive functioning Area of Impairment: Attention;Memory;Following  commands;Safety/judgement;Awareness;Problem solving                   Current Attention Level: Sustained Memory: Decreased short-term memory Following Commands: Follows one step commands consistently;Follows one step commands with increased time Safety/Judgement: Decreased awareness of safety;Decreased awareness of deficits Awareness: Intellectual Problem Solving: Slow processing;Decreased initiation;Difficulty sequencing;Requires verbal cues General Comments: Pt very slow to process and respond to cues/questions. Appears to have difficulty finding words at times. Poor awareness of deficits and safety, dragging L foot often, needing cues to attend to it. Also, displays poor memory of cues to remain proximal to RW.      General Comments      Exercises     Assessment/Plan    PT Assessment Patient needs continued PT services  PT Problem List Decreased strength;Decreased balance;Decreased activity tolerance;Decreased mobility;Decreased coordination;Decreased cognition;Decreased safety awareness;Decreased knowledge of use of DME       PT Treatment Interventions DME instruction;Gait training;Stair training;Functional mobility training;Therapeutic activities;Therapeutic exercise;Balance training;Neuromuscular re-education;Cognitive remediation;Patient/family education    PT Goals (Current goals can be found in the Care Plan section)  Acute Rehab PT Goals Patient Stated Goal: to walk PT Goal Formulation: With patient Time For Goal Achievement: 10/09/20 Potential to Achieve Goals: Good    Frequency Min 3X/week   Barriers to discharge        Co-evaluation               AM-PAC PT "6 Clicks" Mobility  Outcome Measure Help needed turning from your back to your side while in a flat bed without using bedrails?: A Little Help needed moving from lying on your back to sitting on the side of a flat  bed without using bedrails?: A Little Help needed moving to and from a bed to a  chair (including a wheelchair)?: A Little Help needed standing up from a chair using your arms (e.g., wheelchair or bedside chair)?: A Little Help needed to walk in hospital room?: A Lot Help needed climbing 3-5 steps with a railing? : Total 6 Click Score: 15    End of Session Equipment Utilized During Treatment: Gait belt Activity Tolerance: Patient tolerated treatment well Patient left: in bed;with call bell/phone within reach;with bed alarm set Nurse Communication: Mobility status PT Visit Diagnosis: Unsteadiness on feet (R26.81);Other abnormalities of gait and mobility (R26.89);Muscle weakness (generalized) (M62.81);Difficulty in walking, not elsewhere classified (R26.2);History of falling (Z91.81);Other symptoms and signs involving the nervous system (R29.898)    Time: 3903-0092 PT Time Calculation (min) (ACUTE ONLY): 23 min   Charges:   PT Evaluation $PT Eval Moderate Complexity: 1 Mod PT Treatments $Gait Training: 8-22 mins        Moishe Spice, PT, DPT Acute Rehabilitation Services  Pager: [phone] Office: [place] 09/25/2020, 4:58 PM

## 2020-09-25 NOTE — H&P (Signed)
History and Physical    James Henderson HBZ:[id number] DOB: 1950-04-24 DOA: 09/24/2020  PCP: Lois Huxley, PA Patient coming from: Home  Chief Complaint: Left-sided weakness  HPI: James Henderson is a 70 y.o. male with medical history significant of dementia, previous stroke, meningioma, non-insulin-dependent type II diabetes, hypertension, hyperlipidemia, CKD stage II-IIIa presenting to the ED via EMS as code stroke for evaluation of left-sided weakness and confusion.  Symptoms started 2 days ago. Out of tPA window at the time of arrival.  Hemodynamically stable.  Labs showing WBC 5.9, hemoglobin 13.3, platelet count 212.  Sodium 140, potassium 4.0, chloride 108, bicarb 23, BUN 14, creatinine 1.6 (baseline 1.3), glucose 158.  INR 1.0.  Screening COVID and influenza PCR negative.  Head CT and brain MRI negative for acute stroke.  MRA head/neck negative for LVO. Patient was given 1 L normal saline bolus. Neurology felt that the patient's symptoms were due to AKI and recrudescence of previous stroke symptoms.  Recommended EEG, checking ammonia and UA.  If negative, no further neurologic work-up.  UA done and showing no signs of infection.  Ammonia level pending.   Patient states he had a stroke 2 years ago but no weakness afterwards.  Lately he has been weak on his left side and tonight when he went to use the bathroom he fell so his wife became concerned and called EMS.  Denies head injury or loss of consciousness.  He has no other complaints.  Denies fevers, cough, shortness of breath, chest pain, nausea, vomiting, abdominal pain, diarrhea, or dysuria.  Review of Systems:  All systems reviewed and apart from history of presenting illness, are negative.  Past Medical History:  Diagnosis Date   Brainstem stroke ([place]) 2015   Cerebral meningioma ([place])    Diabetes mellitus without complication ([place])    Glaucoma    Hyperlipidemia    Hypertension     Past Surgical History:  Procedure Laterality Date    BIOPSY  02/15/2020   Procedure: BIOPSY;  Surgeon: Ronnette Juniper, MD;  Location: [facility];  Service: Gastroenterology;;   COLONOSCOPY WITH PROPOFOL N/A 02/15/2020   Procedure: COLONOSCOPY WITH PROPOFOL;  Surgeon: Ronnette Juniper, MD;  Location: [facility];  Service: Gastroenterology;  Laterality: N/A;   EYE SURGERY     POLYPECTOMY  02/15/2020   Procedure: POLYPECTOMY;  Surgeon: Ronnette Juniper, MD;  Location: [facility];  Service: Gastroenterology;;     reports that he quit smoking about 22 years ago. He has never used smokeless tobacco. He reports that he does not drink alcohol and does not use drugs.  No Known Allergies  Family History  Problem Relation Age of Onset   Stroke Mother    Stroke Brother    Diabetes type II Brother     Prior to Admission medications   Medication Sig Start Date End Date Taking? Authorizing Provider  acetaminophen (TYLENOL) 500 MG tablet Take 1,000 mg by mouth every 6 (six) hours as needed for headache (pain).   Yes [provider]  amLODipine (NORVASC) 5 MG tablet Take 5 mg by mouth daily. 02/01/20  Yes [provider]  Brinzolamide-Brimonidine 1-0.2 % SUSP Place 1 drop into both eyes 2 (two) times daily.    Yes [provider]  Cholecalciferol (VITAMIN D-3) 125 MCG (5000 UT) TABS Take 5,000 Units by mouth daily.   Yes [provider]  clopidogrel (PLAVIX) 75 MG tablet Take 1 tablet (75 mg total) by mouth daily. 10/25/17  Yes Purohit, Konrad Dolores, MD  donepezil (ARICEPT) 10 MG tablet Take 1 tablet (10 mg total) by mouth at bedtime. 02/22/20  Yes Tomi Likens, Adam R, DO  ezetimibe (ZETIA) 10 MG tablet Take 10 mg by mouth daily. 01/26/20  Yes [provider]  glipiZIDE (GLUCOTROL) 5 MG tablet Take 5 mg by mouth daily.   Yes [provider]  Inositol Niacinate 500 MG CAPS Take 500 mg by mouth every evening.   Yes [provider]  losartan (COZAAR) 100 MG tablet Take 100 mg by mouth daily. 08/16/17  Yes [provider]  metFORMIN (GLUCOPHAGE) 1000 MG tablet Take 500-1,000 mg by mouth See admin instructions. Take1000 mg in the morning and 500 mg in the evening 08/16/17  Yes [provider]  Omega-3 Fatty Acids (FISH OIL) 500 MG CAPS Take 500 mg by mouth every evening.   Yes [provider]  rosuvastatin (CRESTOR) 40 MG tablet Take 1 tablet (40 mg total) by mouth daily at 6 PM. Patient taking differently: Take 40 mg by mouth daily. 10/24/17  Yes Purohit, Konrad Dolores, MD  tolterodine (DETROL LA) 4 MG 24 hr capsule Take 4 mg by mouth daily. 03/30/20  Yes [provider]  Vibegron (GEMTESA) 75 MG TABS Take 75 mg by mouth daily.   Yes [provider]    Physical Exam: Vitals:   09/24/20 2145 09/24/20 2200 09/24/20 2300 09/24/20 2311  BP: 137/74   (!) 157/111  Pulse: 77 74  74  Resp: (!) 21  18 18   Temp:    97.8 F (36.6 C)  TempSrc:    Temporal  SpO2: 96% 98%  100%  Weight:      Height:        Physical Exam Constitutional:      General: He is not in acute distress. HENT:     Head: Normocephalic and atraumatic.  Eyes:     Extraocular Movements: Extraocular movements intact.     Conjunctiva/sclera: Conjunctivae normal.  Cardiovascular:     Rate and Rhythm: Normal rate and regular rhythm.     Pulses: Normal pulses.  Pulmonary:     Effort: Pulmonary effort is normal. No respiratory distress.     Breath sounds: Normal breath sounds. No wheezing or rales.  Abdominal:     General: Bowel sounds are normal. There is no distension.     Palpations: Abdomen is soft.     Tenderness: There is no abdominal tenderness.  Musculoskeletal:        General: No swelling or tenderness.     Cervical back: Normal range of motion and neck supple.  Skin:    General: Skin is warm and dry.  Neurological:     General: No focal deficit present.     Mental Status: He is alert and oriented to person, place, and time.     Cranial Nerves: No cranial nerve deficit.     Sensory: No  sensory deficit.     Motor: No weakness.     Labs on Admission: I have personally reviewed following labs and imaging studies  CBC: Recent Labs  Lab 09/24/20 2041 09/24/20 2042  WBC  --  5.9  NEUTROABS  --  4.6  HGB 13.6 13.3  HCT 40.0 40.6  MCV  --  94.2  PLT  --  235   Basic Metabolic Panel: Recent Labs  Lab 09/24/20 2041 09/24/20 2042  NA 142 140  K 3.9 4.0  CL 108 108  CO2  --  23  GLUCOSE 156*  158*  BUN 15 14  CREATININE 1.80* 1.68*  CALCIUM  --  9.5   GFR: Estimated Creatinine Clearance: 39.7 mL/min (A) (by C-G formula based on SCr of 1.68 mg/dL (H)). Liver Function Tests: Recent Labs  Lab 09/24/20 2042  AST 19  ALT 12  ALKPHOS 110  BILITOT 1.1  PROT 7.1  ALBUMIN 3.9   No results for input(s): LIPASE, AMYLASE in the last 168 hours. No results for input(s): AMMONIA in the last 168 hours. Coagulation Profile: Recent Labs  Lab 09/24/20 2042  INR 1.0   Cardiac Enzymes: No results for input(s): CKTOTAL, CKMB, CKMBINDEX, TROPONINI in the last 168 hours. BNP (last 3 results) No results for input(s): PROBNP in the last 8760 hours. HbA1C: No results for input(s): HGBA1C in the last 72 hours. CBG: Recent Labs  Lab 09/24/20 2036  GLUCAP 140*   Lipid Profile: No results for input(s): CHOL, HDL, LDLCALC, TRIG, CHOLHDL, LDLDIRECT in the last 72 hours. Thyroid Function Tests: No results for input(s): TSH, T4TOTAL, FREET4, T3FREE, THYROIDAB in the last 72 hours. Anemia Panel: No results for input(s): VITAMINB12, FOLATE, FERRITIN, TIBC, IRON, RETICCTPCT in the last 72 hours. Urine analysis:    Component Value Date/Time   COLORURINE YELLOW 09/24/2020 2315   APPEARANCEUR CLEAR 09/24/2020 2315   LABSPEC 1.017 09/24/2020 2315   PHURINE 5.0 09/24/2020 2315   GLUCOSEU NEGATIVE 09/24/2020 2315   HGBUR NEGATIVE 09/24/2020 2315   BILIRUBINUR NEGATIVE 09/24/2020 2315   KETONESUR NEGATIVE 09/24/2020 2315   PROTEINUR 30 (A) 09/24/2020 2315   UROBILINOGEN  0.2 12/07/2013 0032   NITRITE NEGATIVE 09/24/2020 2315   LEUKOCYTESUR NEGATIVE 09/24/2020 2315    Radiological Exams on Admission: MR ANGIO HEAD WO CONTRAST  Result Date: 09/24/2020 CLINICAL DATA:  Multiple recent falls.  Possible acute stroke. EXAM: MRA HEAD WITHOUT CONTRAST TECHNIQUE: Angiographic images of the Circle of Willis were acquired using MRA technique without intravenous contrast. COMPARISON:  No pertinent prior exam. FINDINGS: POSTERIOR CIRCULATION: --Vertebral arteries: Normal --Inferior cerebellar arteries: Normal. --Basilar artery: Normal. --Superior cerebellar arteries: Normal. --Posterior cerebral arteries: Age indeterminate occlusion of the left PCA distal P1 segment. Normal right PCA. ANTERIOR CIRCULATION: --Intracranial internal carotid arteries: Normal. --Anterior cerebral arteries (ACA): Normal. --Middle cerebral arteries (MCA): Moderate-to-severe stenosis of the proximal left M2 segment. Normal right MCA. ANATOMIC VARIANTS: None IMPRESSION: 1. No proximal large vessel occlusion. 2. Age indeterminate occlusion of the left PCA distal P1 segment. 3. Moderate-to-severe stenosis of the proximal left M2 segment. Electronically Signed   By: Ulyses Jarred M.D.   On: 09/24/2020 23:15   MR ANGIO NECK WO CONTRAST  Result Date: 09/24/2020 CLINICAL DATA:  Possible stroke EXAM: MRA NECK WITHOUT CONTRAST TECHNIQUE: Angiographic images of the neck were acquired using MRA technique without intravenous contrast. Carotid stenosis measurements (when applicable) are obtained utilizing NASCET criteria, using the distal internal carotid diameter as the denominator. COMPARISON:  No pertinent prior exam. FINDINGS: Motion degraded noncontrast examination. Aortic arch: Outside the field of view Right carotid system: No visible stenosis. Moderate motion artifact. Left carotid system: Moderate motion artifact.  No visible stenosis. Vertebral arteries: Within the limitations motion degradation no acute  abnormality. Other: None. IMPRESSION: Severely motion degraded noncontrast examination. Within that limitation, no visible abnormality of the carotid or vertebral arteries. Electronically Signed   By: Ulyses Jarred M.D.   On: 09/24/2020 23:17   MR BRAIN WO CONTRAST  Result Date: 09/24/2020 CLINICAL DATA:  Stroke follow-up.  Multiple recent falls. EXAM: MRI HEAD WITHOUT CONTRAST TECHNIQUE:  Multiplanar, multiecho pulse sequences of the brain and surrounding structures were obtained without intravenous contrast. COMPARISON:  None. FINDINGS: Brain: No acute infarct, mass effect or extra-axial collection. Numerous chronic microhemorrhages in a predominantly peripheral distribution. There is multifocal hyperintense T2-weighted signal within the white matter. Generalized volume loss without a clear lobar predilection. The midline structures are normal. Unchanged left posterior fossa small meningioma. Vascular: Major flow voids are preserved. Skull and upper cervical spine: Normal calvarium and skull base. Visualized upper cervical spine and soft tissues are normal. Sinuses/Orbits:No paranasal sinus fluid levels or advanced mucosal thickening. No mastoid or middle ear effusion. Normal orbits. IMPRESSION: 1. No acute intracranial abnormality. 2. Numerous chronic microhemorrhages in a predominantly peripheral distribution, most consistent with cerebral amyloid angiopathy. 3. Unchanged small left posterior fossa meningioma. Electronically Signed   By: Ulyses Jarred M.D.   On: 09/24/2020 23:03   CT HEAD CODE STROKE WO CONTRAST  Result Date: 09/24/2020 CLINICAL DATA:  Code stroke.  Neuro deficit, acute, stroke suspected EXAM: CT HEAD WITHOUT CONTRAST TECHNIQUE: Contiguous axial images were obtained from the base of the skull through the vertex without intravenous contrast. COMPARISON:  None. FINDINGS: Brain: No intracranial hemorrhage. Small left posterior fossa meningioma is unchanged. There is generalized atrophy without  lobar predilection. There is hypoattenuation of the periventricular white matter, most commonly indicating chronic ischemic microangiopathy. Vascular: No abnormal hyperdensity of the major intracranial arteries or dural venous sinuses. No intracranial atherosclerosis. Skull: The visualized skull base, calvarium and extracranial soft tissues are normal. Sinuses/Orbits: No fluid levels or advanced mucosal thickening of the visualized paranasal sinuses. No mastoid or middle ear effusion. The orbits are normal. ASPECTS [facility] Stroke Program Early CT Score) - Ganglionic level infarction (caudate, lentiform nuclei, internal capsule, insula, M1-M3 cortex): 7 - Supraganglionic infarction (M4-M6 cortex): 3 Total score (0-10 with 10 being normal): 10 IMPRESSION: 1. No acute intracranial abnormality. 2. ASPECTS is 10. 3. Unchanged small left posterior fossa meningioma. These results were communicated to Dr. Roland Rack at 9:04 pm on 09/24/2020 by text page via the [facility] messaging system. Electronically Signed   By: Ulyses Jarred M.D.   On: 09/24/2020 21:04    EKG: Independently reviewed.  Sinus rhythm, artifact.  No acute changes.  Assessment/Plan Principal Problem:   Left-sided weakness Active Problems:   Essential hypertension   HLD (hyperlipidemia)   Diabetes mellitus type 2 in nonobese (HCC)   AKI (acute kidney injury) ([place])   Left-sided weakness, confusion Head CT and brain MRI negative for acute stroke.  MRA head/neck negative for LVO.  No weakness or confusion at this time.  Neurology felt that the patient's symptoms were due to AKI and recrudescence of previous stroke symptoms.  Recommended EEG, checking ammonia and UA.  If negative, no further neurologic work-up.  UA done and showing no signs of infection.   -EEG ordered.  Ammonia level pending.  PT/OT eval, fall precautions.  AKI on CKD stage II-IIIa BUN 14, creatinine 1.6 (baseline 1.3).  Possibly prerenal from dehydration. -IV fluid  hydration.  Monitor renal function and urine output.  Avoid nephrotoxic agents.  Hold home losartan.  History of CVA -Continue Plavix and statin  Non-insulin-dependent type 2 diabetes -Check A1c.  Hold home oral hypoglycemic agents.  Order sliding scale insulin sensitive ACHS.  Hypertension Stable. -Continue home amlodipine.  Hold losartan in the setting of AKI.  Hyperlipidemia -Continue Crestor and Zetia  Overactive bladder -Continue Toviaz and vibegron  Dementia -Continue Aricept  DVT prophylaxis: Lovenox Code Status: Full  code-discussed with the patient. Family Communication: No family available at this time. Disposition Plan: Status is: Observation  The patient remains OBS appropriate and will d/c before 2 midnights.  Dispo: The patient is from: Home              Anticipated d/c is to: Home              Patient currently is not medically stable to d/c.   Difficult to place patient No  Level of care: Level of care: Med-Surg  The medical decision making on this patient was of high complexity and the patient is at high risk for clinical deterioration, therefore this is a level 3 visit.  Shela Leff MD [facility]  If 7PM-7AM, please contact night-coverage [email or web address]  09/25/2020, 12:46 AM

## 2020-09-25 NOTE — Progress Notes (Addendum)
Brief Neuro Note:  I saw him at the bedside with his wife. His LUE is much stronger now. Can lift it off the bed and keep it in the air for more than 10 secs with no drift. Does endorse that LUE strength waxes and wanes with the day. He is weaker when he is tired or sleep deprived. MRI brain with no acute stroke. Routine EEG was done by tech and read is pending. If negative for seizures or any epileptogenic abnormality, can be discharged home with outpatient neurology follow up.  Update 6:08 PM: rEEG negative for seizures. We will signoff. Okay to discharge home with OP neurology follow up.  [place] Pager Number [phone]

## 2020-09-25 NOTE — Care Management Obs Status (Signed)
[place] NOTIFICATION   Patient Details  Name: James Henderson MRN: [id number] Date of Birth: [birth year]   Medicare Observation Status Notification Given:  Yes    Bartholomew Crews, RN 09/25/2020, 5:37 PM

## 2020-09-25 NOTE — Progress Notes (Signed)
Patient is admitted this a.m., detail please see H&P, patient is seen and examined at bedside, he is not reliable historian, Wife states patient is not at his baseline, wife would like him getting stronger before going home with home health vs snf Wife states patient was diagnosed with dementia a year ago   Continue to have left-sided weakness , no acute cva on mri, eeg result pending appear slightly dehydrated, continue hydration, repeat bmp in am   Other findings on MRI: cerebral amyloid angiopathy  Unchanged small left posterior fossa meningioma.  Neurology consulted, will follow recommendation

## 2020-09-25 NOTE — Evaluation (Signed)
Clinical/Bedside Swallow Evaluation Patient Details  Name: James Henderson MRN: [id number] Date of Birth: [birth year]  Today's Date: 09/25/2020 Time: SLP Start Time (ACUTE ONLY): 1962 SLP Stop Time (ACUTE ONLY): 1330 SLP Time Calculation (min) (ACUTE ONLY): 15 min  Past Medical History:  Past Medical History:  Diagnosis Date   Brainstem stroke ([place]) 2015   Cerebral meningioma ([place])    Diabetes mellitus without complication ([place])    Glaucoma    Hyperlipidemia    Hypertension    Past Surgical History:  Past Surgical History:  Procedure Laterality Date   BIOPSY  02/15/2020   Procedure: BIOPSY;  Surgeon: Ronnette Juniper, MD;  Location: [facility];  Service: Gastroenterology;;   COLONOSCOPY WITH PROPOFOL N/A 02/15/2020   Procedure: COLONOSCOPY WITH PROPOFOL;  Surgeon: Ronnette Juniper, MD;  Location: [facility];  Service: Gastroenterology;  Laterality: N/A;   EYE SURGERY     POLYPECTOMY  02/15/2020   Procedure: POLYPECTOMY;  Surgeon: Ronnette Juniper, MD;  Location: [facility];  Service: Gastroenterology;;   HPI:  Pt is a 70 y.o. male who presented 09/24/20 with L-sided weakness and AMS. Head CT and brain MRI negative for acute stroke.  MRA head/neck negative for LVO. Pt found to have AKI on CKD stage II-IIIa, and felt weakness due to recrudescence of stroke symptoms  PMH: dementia, brainstem stroke, meningioma, non-insulin-dependent type II diabetes, glaucoma, hypertension, hyperlipidemia, CKD stage II-IIIa   Assessment / Plan / Recommendation Clinical Impression  Pts swallow appears grossly within functional limits. Per RN pt tolerating regular thin liquid diet well. Pt denies acute dysphagia complaints or h/o swallowing troubles. No overt s/sx of aspiration with any PO. Oral motor exam unremarkable. Continue regular thin liquid diet. No further ST needs identified.  SLP Visit Diagnosis: Dysphagia, unspecified (R13.10)    Aspiration Risk  Mild aspiration risk    Diet Recommendation   Regular  thin liquids   Medication Administration: Whole meds with liquid    Other  Recommendations Oral Care Recommendations: Oral care BID   Follow up Recommendations        Frequency and Duration            Prognosis Prognosis for Safe Diet Advancement: Good      Swallow Study   General Date of Onset: 09/24/20 HPI: Pt is a 70 y.o. male who presented 09/24/20 with L-sided weakness and AMS. Head CT and brain MRI negative for acute stroke.  MRA head/neck negative for LVO. Pt found to have AKI on CKD stage II-IIIa, and felt weakness due to recrudescence of stroke symptoms  PMH: dementia, brainstem stroke, meningioma, non-insulin-dependent type II diabetes, glaucoma, hypertension, hyperlipidemia, CKD stage II-IIIa Type of Study: Bedside Swallow Evaluation Previous Swallow Assessment: none on file Diet Prior to this Study: Regular;Thin liquids Temperature Spikes Noted: No Respiratory Status: Room air History of Recent Intubation: No Behavior/Cognition: Alert;Cooperative;Pleasant mood Oral Care Completed by SLP: No Vision: Functional for self-feeding Self-Feeding Abilities: Needs set up Patient Positioning: Upright in bed Baseline Vocal Quality: Normal Volitional Cough: Strong Volitional Swallow: Able to elicit    Oral/Motor/Sensory Function Overall Oral Motor/Sensory Function: Within functional limits   Ice Chips Ice chips: Not tested   Thin Liquid Thin Liquid: Within functional limits    Nectar Thick Nectar Thick Liquid: Not tested   Honey Thick Honey Thick Liquid: Not tested   Puree Puree: Within functional limits   Solid     Solid: Within functional limits      Hayden Rasmussen MA, [place]  09/25/2020,1:35 PM

## 2020-09-25 NOTE — Progress Notes (Signed)
EEG completed, results pending. 

## 2020-09-25 NOTE — Procedures (Signed)
Routine EEG Report  James Henderson is a 70 y.o. male with a history of worsening L-sided weakness who is undergoing an EEG to evaluate for seizures.  Report: This EEG was acquired with electrodes placed according to the International 10-20 electrode system (including Fp1, Fp2, F3, F4, C3, C4, P3, P4, O1, O2, T3, T4, T5, T6, A1, A2, Fz, Cz, Pz). The following electrodes were missing or displaced: none.  The occipital dominant rhythm was 7-8 Hz. This activity is reactive to stimulation. Drowsiness was manifested by background fragmentation; sleep was manifested by sleep spindles and K complexes. There was no focal slowing. There were no interictal epileptiform discharges. There were no electrographic seizures identified. Photic stimulation and hyperventilation were not performed.  Impression and clinical correlation: This EEG was obtained while awake and asleep and is abnormal due to mild diffuse slowing indicating global cerebral dysfunction.  Su Monks, MD [facility] [phone]  If 7pm- 7am, please page neurology on call as listed in [facility].

## 2020-09-26 DIAGNOSIS — R531 Weakness: Secondary | ICD-10-CM | POA: Diagnosis not present

## 2020-09-26 LAB — GLUCOSE, CAPILLARY
Glucose-Capillary: 127 mg/dL — ABNORMAL HIGH (ref 70–99)
Glucose-Capillary: 122 mg/dL — ABNORMAL HIGH (ref 70–99)
Glucose-Capillary: 117 mg/dL — ABNORMAL HIGH (ref 70–99)

## 2020-09-26 LAB — BASIC METABOLIC PANEL
Anion gap: 6 (ref 5–15)
BUN: 15 mg/dL (ref 8–23)
CO2: 25 mmol/L (ref 22–32)
Calcium: 8.9 mg/dL (ref 8.9–10.3)
Chloride: 106 mmol/L (ref 98–111)
Creatinine, Ser: 1.4 mg/dL — ABNORMAL HIGH (ref 0.61–1.24)
GFR, Estimated: 53 mL/min — ABNORMAL LOW (ref >60)
Glucose, Bld: 239 mg/dL — ABNORMAL HIGH (ref 70–99)
Potassium: 3.7 mmol/L (ref 3.5–5.1)
Sodium: 137 mmol/L (ref 135–145)

## 2020-09-26 NOTE — Progress Notes (Signed)
Physical Therapy Treatment Patient Details Name: James Henderson MRN: [id number] DOB: 04/26/1950 Today's Date: 09/26/2020    History of Present Illness Pt is a 70 y.o. male who presented 09/24/20 with L-sided weakness and AMS. Head CT and brain MRI negative for acute stroke.  MRA head/neck negative for LVO. Pt found to have AKI on CKD stage II-IIIa, and felt weakness due to recrudescence of stroke symptoms  PMH: dementia, brainstem stroke, meningioma, non-insulin-dependent type II diabetes, glaucoma, hypertension, hyperlipidemia, CKD stage II-IIIa    PT Comments    Continuing work on functional mobility and activity tolerance;  Session focused on gait and balance, and pt was able to walk with RW and mod assist (chair pushed behind for safety); Given level of assist needed, and after discussion with OT; updated dc plan to SNF for rehab to maximize independence and safety with mobility -- still, noted SW note and pt's wife is opting for home; It sounds like they have plenty of assist at home, and will have HHPT/OT follow up   Follow Up Recommendations  SNF Family is choosing home with HHPT, [place]     Equipment Recommendations  Rolling walker with 5" wheels    Recommendations for Other Services       Precautions / Restrictions Precautions Precautions: Fall Restrictions Weight Bearing Restrictions: No    Mobility  Bed Mobility Overal bed mobility: Needs Assistance Bed Mobility: Supine to Sit     Supine to sit: Min assist     General bed mobility comments: Slow moving; multimodal cues to weight shift for more efficient reciprocal scooting    Transfers Overall transfer level: Needs assistance Equipment used: Rolling walker (2 wheeled) Transfers: Sit to/from Stand Sit to Stand: Min assist         General transfer comment: min assist with noted enough strength to power up, but needing min assist for balance due to posterior lean  Ambulation/Gait Ambulation/Gait assistance: Mod  assist Gait Distance (Feet): 50 Feet Assistive device: Rolling walker (2 wheeled) Gait Pattern/deviations: Step-to pattern;Decreased step length - left;Decreased dorsiflexion - left;Decreased stride length;Shuffle;Trunk flexed;Drifts right/left Gait velocity: reduced   General Gait Details: multimodal cues for full weight shift into single limb stance and allow for freer swing limb advancement; occasional L foot drag, and need for cueing for midline and RW proximity   Stairs             Wheelchair Mobility    Modified Rankin (Stroke Patients Only)       Balance Overall balance assessment: Needs assistance Sitting-balance support: Feet supported Sitting balance-Leahy Scale: Fair     Standing balance support: Bilateral upper extremity supported;During functional activity Standing balance-Leahy Scale: Poor                              Cognition Arousal/Alertness: Awake/alert Behavior During Therapy: WFL for tasks assessed/performed Overall Cognitive Status: No family/caregiver present to determine baseline cognitive functioning Area of Impairment: Attention;Memory;Following commands;Safety/judgement;Awareness;Problem solving                     Memory: Decreased short-term memory Following Commands: Follows one step commands consistently;Follows one step commands with increased time Safety/Judgement: Decreased awareness of safety;Decreased awareness of deficits   Problem Solving: Slow processing;Decreased initiation;Difficulty sequencing;Requires verbal cues General Comments: Pt very slow to process and respond to cues/questions. Appears to have difficulty finding words at times. Poor awareness of deficits and safety, dragging L foot often, needing  cues to attend to it. Also, displays poor memory of cues to remain close to RW during mobility      Exercises      General Comments        Pertinent Vitals/Pain Pain Assessment: Faces Pain Score:  0-No pain Faces Pain Scale: No hurt Pain Intervention(s): Monitored during session    Home Living                      Prior Function            PT Goals (current goals can now be found in the care plan section) Acute Rehab PT Goals Patient Stated Goal: to walk PT Goal Formulation: With patient Time For Goal Achievement: 10/09/20 Potential to Achieve Goals: Good Progress towards PT goals: Progressing toward goals    Frequency    Min 2X/week      PT Plan Discharge plan needs to be updated;Frequency needs to be updated    Co-evaluation              AM-PAC PT "6 Clicks" Mobility   Outcome Measure  Help needed turning from your back to your side while in a flat bed without using bedrails?: A Little Help needed moving from lying on your back to sitting on the side of a flat bed without using bedrails?: A Little Help needed moving to and from a bed to a chair (including a wheelchair)?: A Little Help needed standing up from a chair using your arms (e.g., wheelchair or bedside chair)?: A Little Help needed to walk in hospital room?: A Lot Help needed climbing 3-5 steps with a railing? : Total 6 Click Score: 15    End of Session Equipment Utilized During Treatment: Gait belt Activity Tolerance: Patient tolerated treatment well Patient left: in chair;with call bell/phone within reach;with chair alarm set Nurse Communication: Mobility status PT Visit Diagnosis: Unsteadiness on feet (R26.81);Other abnormalities of gait and mobility (R26.89);Muscle weakness (generalized) (M62.81);Difficulty in walking, not elsewhere classified (R26.2);History of falling (Z91.81);Other symptoms and signs involving the nervous system (G66.599)     Time: 3570-1779 PT Time Calculation (min) (ACUTE ONLY): 31 min  Charges:  $Gait Training: 8-22 mins $Therapeutic Activity: 8-22 mins                     Roney Marion, PT  Acute Rehabilitation Services Pager [phone] Office  [place] 09/26/2020, 4:19 PM

## 2020-09-26 NOTE — Progress Notes (Signed)
Pt. D/C home, instruction given to pt. Wife. Wife verbalized understanding. Pt. Taken to car via wheelchair by nurse.

## 2020-09-26 NOTE — Care Management (Signed)
Referral for HHPT/OT given to [facility] with [facility] . Referral declined due to staffing.   NCM went to patient room. Wife not present. Patient asked NCM to call wife.   NCM left message.    James Henderson called back. She is aware [facility] declined referral. She has no preference in home health agency.   NCM called Stacie with [facility] and referral accepted.

## 2020-09-26 NOTE — Discharge Summary (Signed)
Physician Discharge Summary  James Henderson WNU:[id number] DOB: 11-27-1950 DOA: 09/24/2020  PCP: Lois Huxley, PA  Admit date: 09/24/2020 Discharge date: 09/26/2020  Admitted From: Home Disposition:  Home  Discharge Condition:Stable CODE STATUS:FULL Diet recommendation: Heart Healthy   Brief/Interim Summary:  HPI( Dr Marlowe Sax): James Henderson is a 70 y.o. male with medical history significant of dementia, previous stroke, meningioma, non-insulin-dependent type II diabetes, hypertension, hyperlipidemia, CKD stage II-IIIa presenting to the ED via EMS as code stroke for evaluation of left-sided weakness and confusion.  Symptoms started 2 days ago. Out of tPA window at the time of arrival.  Hemodynamically stable.  Labs showing WBC 5.9, hemoglobin 13.3, platelet count 212.  Sodium 140, potassium 4.0, chloride 108, bicarb 23, BUN 14, creatinine 1.6 (baseline 1.3), glucose 158.  INR 1.0.  Screening COVID and influenza PCR negative.  Head CT and brain MRI negative for acute stroke.  MRA head/neck negative for LVO. Patient was given 1 L normal saline bolus. Neurology felt that the patient's symptoms were due to AKI and recrudescence of previous stroke symptoms.  Recommended EEG, checking ammonia and UA.  If negative, no further neurologic work-up.  UA done and showing no signs of infection.  Ammonia level pending.   Patient states he had a stroke 2 years ago but no weakness afterwards.  Lately he has been weak on his left side and tonight when he went to use the bathroom he fell so his wife became concerned and called EMS.  Denies head injury or loss of consciousness.  He has no other complaints.  Denies fevers, cough, shortness of breath, chest pain, nausea, vomiting, abdominal pain, diarrhea, or dysuria.  Hospital course: Hospital course remained stable.  Neurology was consulted after admission.  His left upper extremity weakness has improved and is at baseline.  MRI of the brain did not show any acute  intracranial findings.  EEG showed mild diffuse slowing indicating global cerebral dysfunction but no epileptiform discharges or seizures.  Currently patient is alert and oriented and is at baseline.  PT/OT recommended skilled nursing facility on discharge but patient's family wanted to take him home with home health. His kidney function has returned to baseline. Patient seen and examined the bedside this afternoon.  He was sitting in the chair, alert, awake and was at baseline mental status.  His weakness on the left upper extremity remained stable which is from prior stroke.  He is medically stable for discharge to home today.  We will continue his home medications.  I will recommend him to follow-up with his neurologist as an outpatient. I tried to call the wife but could not contact her because it went directly to voicemail.  I have alerted TOC for arranging home health.  Discharge Diagnoses:  Principal Problem:   Left-sided weakness Active Problems:   Essential hypertension   HLD (hyperlipidemia)   Diabetes mellitus type 2 in nonobese (HCC)   AKI (acute kidney injury) [facility])    Discharge Instructions  Discharge Instructions     Diet - low sodium heart healthy   Complete by: As directed    Discharge instructions   Complete by: As directed    1)Please follow up with your neurologist as an outpatient 2)Continue your home medications.   Increase activity slowly   Complete by: As directed       Allergies as of 09/26/2020   No Known Allergies      Medication List     TAKE these medications  acetaminophen 500 MG tablet Commonly known as: TYLENOL Take 1,000 mg by mouth every 6 (six) hours as needed for headache (pain).   amLODipine 5 MG tablet Commonly known as: NORVASC Take 5 mg by mouth daily.   Brinzolamide-Brimonidine 1-0.2 % Susp Place 1 drop into both eyes 2 (two) times daily.   clopidogrel 75 MG tablet Commonly known as: PLAVIX Take 1 tablet (75 mg total) by  mouth daily.   donepezil 10 MG tablet Commonly known as: ARICEPT Take 1 tablet (10 mg total) by mouth at bedtime.   ezetimibe 10 MG tablet Commonly known as: ZETIA Take 10 mg by mouth daily.   Fish Oil 500 MG Caps Take 500 mg by mouth every evening.   Gemtesa 75 MG Tabs Generic drug: Vibegron Take 75 mg by mouth daily.   glipiZIDE 5 MG tablet Commonly known as: GLUCOTROL Take 5 mg by mouth daily.   Inositol Niacinate 500 MG Caps Take 500 mg by mouth every evening.   losartan 100 MG tablet Commonly known as: COZAAR Take 100 mg by mouth daily.   metFORMIN 1000 MG tablet Commonly known as: GLUCOPHAGE Take 500-1,000 mg by mouth See admin instructions. Take1000 mg in the morning and 500 mg in the evening   rosuvastatin 40 MG tablet Commonly known as: CRESTOR Take 1 tablet (40 mg total) by mouth daily at 6 PM. What changed: when to take this   tolterodine 4 MG 24 hr capsule Commonly known as: DETROL LA Take 4 mg by mouth daily.   Vitamin D-3 125 MCG (5000 UT) Tabs Take 5,000 Units by mouth daily.        Follow-up Information     [facility] [place] [facility].   Specialty: [facility] Contact information: [place] [address] [phone]         Lois Huxley, PA. Schedule an appointment as soon as possible for a visit in 1 week(s).   Specialty: Family Medicine Contact information: [place] [address] [phone]                No Known Allergies  Consultations: Neurology   Procedures/Studies: MR ANGIO HEAD WO CONTRAST  Result Date: 09/24/2020 CLINICAL DATA:  Multiple recent falls.  Possible acute stroke. EXAM: MRA HEAD WITHOUT CONTRAST TECHNIQUE: Angiographic images of the Circle of Willis were acquired using MRA technique without intravenous contrast. COMPARISON:  No pertinent prior exam. FINDINGS: POSTERIOR CIRCULATION: --Vertebral arteries: Normal --Inferior cerebellar  arteries: Normal. --Basilar artery: Normal. --Superior cerebellar arteries: Normal. --Posterior cerebral arteries: Age indeterminate occlusion of the left PCA distal P1 segment. Normal right PCA. ANTERIOR CIRCULATION: --Intracranial internal carotid arteries: Normal. --Anterior cerebral arteries (ACA): Normal. --Middle cerebral arteries (MCA): Moderate-to-severe stenosis of the proximal left M2 segment. Normal right MCA. ANATOMIC VARIANTS: None IMPRESSION: 1. No proximal large vessel occlusion. 2. Age indeterminate occlusion of the left PCA distal P1 segment. 3. Moderate-to-severe stenosis of the proximal left M2 segment. Electronically Signed   By: Ulyses Jarred M.D.   On: 09/24/2020 23:15   MR ANGIO NECK WO CONTRAST  Result Date: 09/24/2020 CLINICAL DATA:  Possible stroke EXAM: MRA NECK WITHOUT CONTRAST TECHNIQUE: Angiographic images of the neck were acquired using MRA technique without intravenous contrast. Carotid stenosis measurements (when applicable) are obtained utilizing NASCET criteria, using the distal internal carotid diameter as the denominator. COMPARISON:  No pertinent prior exam. FINDINGS: Motion degraded noncontrast examination. Aortic arch: Outside the field of  view Right carotid system: No visible stenosis. Moderate motion artifact. Left carotid system: Moderate motion artifact.  No visible stenosis. Vertebral arteries: Within the limitations motion degradation no acute abnormality. Other: None. IMPRESSION: Severely motion degraded noncontrast examination. Within that limitation, no visible abnormality of the carotid or vertebral arteries. Electronically Signed   By: Ulyses Jarred M.D.   On: 09/24/2020 23:17   MR BRAIN WO CONTRAST  Result Date: 09/24/2020 CLINICAL DATA:  Stroke follow-up.  Multiple recent falls. EXAM: MRI HEAD WITHOUT CONTRAST TECHNIQUE: Multiplanar, multiecho pulse sequences of the brain and surrounding structures were obtained without intravenous contrast. COMPARISON:   None. FINDINGS: Brain: No acute infarct, mass effect or extra-axial collection. Numerous chronic microhemorrhages in a predominantly peripheral distribution. There is multifocal hyperintense T2-weighted signal within the white matter. Generalized volume loss without a clear lobar predilection. The midline structures are normal. Unchanged left posterior fossa small meningioma. Vascular: Major flow voids are preserved. Skull and upper cervical spine: Normal calvarium and skull base. Visualized upper cervical spine and soft tissues are normal. Sinuses/Orbits:No paranasal sinus fluid levels or advanced mucosal thickening. No mastoid or middle ear effusion. Normal orbits. IMPRESSION: 1. No acute intracranial abnormality. 2. Numerous chronic microhemorrhages in a predominantly peripheral distribution, most consistent with cerebral amyloid angiopathy. 3. Unchanged small left posterior fossa meningioma. Electronically Signed   By: Ulyses Jarred M.D.   On: 09/24/2020 23:03   EEG adult  Result Date: 09/25/2020 Derek Jack, MD     09/25/2020  3:39 PM Routine EEG Report Jaaziah Ambrosius is a 70 y.o. male with a history of worsening L-sided weakness who is undergoing an EEG to evaluate for seizures. Report: This EEG was acquired with electrodes placed according to the International 10-20 electrode system (including Fp1, Fp2, F3, F4, C3, C4, P3, P4, O1, O2, T3, T4, T5, T6, A1, A2, Fz, Cz, Pz). The following electrodes were missing or displaced: none. The occipital dominant rhythm was 7-8 Hz. This activity is reactive to stimulation. Drowsiness was manifested by background fragmentation; sleep was manifested by sleep spindles and K complexes. There was no focal slowing. There were no interictal epileptiform discharges. There were no electrographic seizures identified. Photic stimulation and hyperventilation were not performed. Impression and clinical correlation: This EEG was obtained while awake and asleep and is abnormal due  to mild diffuse slowing indicating global cerebral dysfunction. Su Monks, MD [facility] [phone] If 7pm- 7am, please page neurology on call as listed in [place].   CT HEAD CODE STROKE WO CONTRAST  Result Date: 09/24/2020 CLINICAL DATA:  Code stroke.  Neuro deficit, acute, stroke suspected EXAM: CT HEAD WITHOUT CONTRAST TECHNIQUE: Contiguous axial images were obtained from the base of the skull through the vertex without intravenous contrast. COMPARISON:  None. FINDINGS: Brain: No intracranial hemorrhage. Small left posterior fossa meningioma is unchanged. There is generalized atrophy without lobar predilection. There is hypoattenuation of the periventricular white matter, most commonly indicating chronic ischemic microangiopathy. Vascular: No abnormal hyperdensity of the major intracranial arteries or dural venous sinuses. No intracranial atherosclerosis. Skull: The visualized skull base, calvarium and extracranial soft tissues are normal. Sinuses/Orbits: No fluid levels or advanced mucosal thickening of the visualized paranasal sinuses. No mastoid or middle ear effusion. The orbits are normal. ASPECTS [facility] Stroke Program Early CT Score) - Ganglionic level infarction (caudate, lentiform nuclei, internal capsule, insula, M1-M3 cortex): 7 - Supraganglionic infarction (M4-M6 cortex): 3 Total score (0-10 with 10 being normal): 10 IMPRESSION: 1. No acute intracranial abnormality. 2. ASPECTS is 10.  3. Unchanged small left posterior fossa meningioma. These results were communicated to Dr. Roland Rack at 9:04 pm on 09/24/2020 by text page via the [facility] messaging system. Electronically Signed   By: Ulyses Jarred M.D.   On: 09/24/2020 21:04      Discharge Exam: Vitals:   09/26/20 0722 09/26/20 1146  BP: (!) 150/86 (!) 159/86  Pulse: 83 81  Resp: 17 18  Temp: 98.3 F (36.8 C) 98.7 F (37.1 C)  SpO2: 99% 99%   Vitals:   09/25/20 2321 09/26/20 0411 09/26/20 0722 09/26/20 1146   BP: (!) 153/99 (!) 160/92 (!) 150/86 (!) 159/86  Pulse: 64 82 83 81  Resp: 18 17 17 18   Temp: 98.9 F (37.2 C) 98.5 F (36.9 C) 98.3 F (36.8 C) 98.7 F (37.1 C)  TempSrc: Oral Oral Oral Oral  SpO2: 99% 99% 99% 99%  Weight:      Height:        General: Pt is alert, awake, not in acute distress Cardiovascular: RRR, S1/S2 +, no rubs, no gallops Respiratory: CTA bilaterally, no wheezing, no rhonchi Abdominal: Soft, NT, ND, bowel sounds + Extremities: no edema, no cyanosis, left sided weakness    The results of significant diagnostics from this hospitalization (including imaging, microbiology, ancillary and laboratory) are listed below for reference.     Microbiology: Recent Results (from the past 240 hour(s))  Resp Panel by RT-PCR (Flu A&B, Covid) Nasopharyngeal Swab     Status: None   Collection Time: 09/24/20  8:50 PM   Specimen: Nasopharyngeal Swab; Nasopharyngeal(NP) swabs in vial transport medium  Result Value Ref Range Status   SARS Coronavirus 2 by RT PCR NEGATIVE NEGATIVE Final    Comment: (NOTE) SARS-CoV-2 target nucleic acids are NOT DETECTED.  The SARS-CoV-2 RNA is generally detectable in upper respiratory specimens during the acute phase of infection. The lowest concentration of SARS-CoV-2 viral copies this assay can detect is 138 copies/mL. A negative result does not preclude SARS-Cov-2 infection and should not be used as the sole basis for treatment or other patient management decisions. A negative result may occur with  improper specimen collection/handling, submission of specimen other than nasopharyngeal swab, presence of viral mutation(s) within the areas targeted by this assay, and inadequate number of viral copies(<138 copies/mL). A negative result must be combined with clinical observations, patient history, and epidemiological information. The expected result is Negative.  Fact Sheet for Patients:  EntrepreneurPulse.com.au  Fact  Sheet for Healthcare Providers:  IncredibleEmployment.be  This test is no t yet approved or cleared by the [place] FDA and  has been authorized for detection and/or diagnosis of SARS-CoV-2 by FDA under an Emergency Use Authorization (EUA). This EUA will remain  in effect (meaning this test can be used) for the duration of the COVID-19 declaration under Section 564(b)(1) of the Act, 21 U.S.C.section 360bbb-3(b)(1), unless the authorization is terminated  or revoked sooner.       Influenza A by PCR NEGATIVE NEGATIVE Final   Influenza B by PCR NEGATIVE NEGATIVE Final    Comment: (NOTE) The Xpert Xpress SARS-CoV-2/FLU/RSV plus assay is intended as an aid in the diagnosis of influenza from Nasopharyngeal swab specimens and should not be used as a sole basis for treatment. Nasal washings and aspirates are unacceptable for Xpert Xpress SARS-CoV-2/FLU/RSV testing.  Fact Sheet for Patients: EntrepreneurPulse.com.au  Fact Sheet for Healthcare Providers: IncredibleEmployment.be  This test is not yet approved or cleared by the [place] and has been authorized for  detection and/or diagnosis of SARS-CoV-2 by FDA under an Emergency Use Authorization (EUA). This EUA will remain in effect (meaning this test can be used) for the duration of the COVID-19 declaration under Section 564(b)(1) of the Act, 21 U.S.C. section 360bbb-3(b)(1), unless the authorization is terminated or revoked.  Performed at [facility], [place] [address], [place], [place] [address]      Labs: BNP (last 3 results) No results for input(s): BNP in the last 8760 hours. Basic Metabolic Panel: Recent Labs  Lab 09/24/20 2041 09/24/20 2042 09/25/20 0055 09/26/20 0835  NA 142 140 141 137  K 3.9 4.0 3.9 3.7  CL 108 108 109 106  CO2  --  23 25 25   GLUCOSE 156* 158* 121* 239*  BUN 15 14 12 15   CREATININE 1.80* 1.68* 1.63* 1.40*  CALCIUM  --   9.5 9.1 8.9   Liver Function Tests: Recent Labs  Lab 09/24/20 2042  AST 19  ALT 12  ALKPHOS 110  BILITOT 1.1  PROT 7.1  ALBUMIN 3.9   No results for input(s): LIPASE, AMYLASE in the last 168 hours. Recent Labs  Lab 09/24/20 2359  AMMONIA 12   CBC: Recent Labs  Lab 09/24/20 2041 09/24/20 2042  WBC  --  5.9  NEUTROABS  --  4.6  HGB 13.6 13.3  HCT 40.0 40.6  MCV  --  94.2  PLT  --  212   Cardiac Enzymes: No results for input(s): CKTOTAL, CKMB, CKMBINDEX, TROPONINI in the last 168 hours. BNP: Invalid input(s): POCBNP CBG: Recent Labs  Lab 09/25/20 1240 09/25/20 1629 09/25/20 2112 09/26/20 0604 09/26/20 1104  GLUCAP 92 145* 124* 127* 122*   D-Dimer No results for input(s): DDIMER in the last 72 hours. Hgb A1c Recent Labs    09/25/20 0041  HGBA1C 7.0*   Lipid Profile No results for input(s): CHOL, HDL, LDLCALC, TRIG, CHOLHDL, LDLDIRECT in the last 72 hours. Thyroid function studies No results for input(s): TSH, T4TOTAL, T3FREE, THYROIDAB in the last 72 hours.  Invalid input(s): FREET3 Anemia work up No results for input(s): VITAMINB12, FOLATE, FERRITIN, TIBC, IRON, RETICCTPCT in the last 72 hours. Urinalysis    Component Value Date/Time   COLORURINE YELLOW 09/24/2020 2315   APPEARANCEUR CLEAR 09/24/2020 2315   LABSPEC 1.017 09/24/2020 2315   PHURINE 5.0 09/24/2020 2315   GLUCOSEU NEGATIVE 09/24/2020 2315   HGBUR NEGATIVE 09/24/2020 2315   BILIRUBINUR NEGATIVE 09/24/2020 2315   KETONESUR NEGATIVE 09/24/2020 2315   PROTEINUR 30 (A) 09/24/2020 2315   UROBILINOGEN 0.2 12/07/2013 0032   NITRITE NEGATIVE 09/24/2020 2315   LEUKOCYTESUR NEGATIVE 09/24/2020 2315   Sepsis Labs Invalid input(s): PROCALCITONIN,  WBC,  LACTICIDVEN Microbiology Recent Results (from the past 240 hour(s))  Resp Panel by RT-PCR (Flu A&B, Covid) Nasopharyngeal Swab     Status: None   Collection Time: 09/24/20  8:50 PM   Specimen: Nasopharyngeal Swab; Nasopharyngeal(NP) swabs  in vial transport medium  Result Value Ref Range Status   SARS Coronavirus 2 by RT PCR NEGATIVE NEGATIVE Final    Comment: (NOTE) SARS-CoV-2 target nucleic acids are NOT DETECTED.  The SARS-CoV-2 RNA is generally detectable in upper respiratory specimens during the acute phase of infection. The lowest concentration of SARS-CoV-2 viral copies this assay can detect is 138 copies/mL. A negative result does not preclude SARS-Cov-2 infection and should not be used as the sole basis for treatment or other patient management decisions. A negative result may occur with  improper specimen collection/handling, submission  of specimen other than nasopharyngeal swab, presence of viral mutation(s) within the areas targeted by this assay, and inadequate number of viral copies(<138 copies/mL). A negative result must be combined with clinical observations, patient history, and epidemiological information. The expected result is Negative.  Fact Sheet for Patients:  EntrepreneurPulse.com.au  Fact Sheet for Healthcare Providers:  IncredibleEmployment.be  This test is no t yet approved or cleared by the [place] FDA and  has been authorized for detection and/or diagnosis of SARS-CoV-2 by FDA under an Emergency Use Authorization (EUA). This EUA will remain  in effect (meaning this test can be used) for the duration of the COVID-19 declaration under Section 564(b)(1) of the Act, 21 U.S.C.section 360bbb-3(b)(1), unless the authorization is terminated  or revoked sooner.       Influenza A by PCR NEGATIVE NEGATIVE Final   Influenza B by PCR NEGATIVE NEGATIVE Final    Comment: (NOTE) The Xpert Xpress SARS-CoV-2/FLU/RSV plus assay is intended as an aid in the diagnosis of influenza from Nasopharyngeal swab specimens and should not be used as a sole basis for treatment. Nasal washings and aspirates are unacceptable for Xpert Xpress  SARS-CoV-2/FLU/RSV testing.  Fact Sheet for Patients: EntrepreneurPulse.com.au  Fact Sheet for Healthcare Providers: IncredibleEmployment.be  This test is not yet approved or cleared by the [place] FDA and has been authorized for detection and/or diagnosis of SARS-CoV-2 by FDA under an Emergency Use Authorization (EUA). This EUA will remain in effect (meaning this test can be used) for the duration of the COVID-19 declaration under Section 564(b)(1) of the Act, 21 U.S.C. section 360bbb-3(b)(1), unless the authorization is terminated or revoked.  Performed at [facility], [place] [address], [place], [place] [address]     Please note: You were cared for by a hospitalist during your hospital stay. Once you are discharged, your primary care physician will handle any further medical issues. Please note that NO REFILLS for any discharge medications will be authorized once you are discharged, as it is imperative that you return to your primary care physician (or establish a relationship with a primary care physician if you do not have one) for your post hospital discharge needs so that they can reassess your need for medications and monitor your lab values.    Time coordinating discharge: 40 minutes  SIGNED:   Shelly Coss, MD  [facility] 09/26/2020, 3:30 PM Pager [phone]  If 7PM-7AM, please contact night-coverage [email or web address] Password [email or web address]

## 2020-09-26 NOTE — Progress Notes (Signed)
Occupational Therapy Treatment Patient Details Name: James Henderson MRN: [id number] DOB: [birth year] Today's Date: 09/26/2020    History of present illness Pt is a 70 y.o. male who presented 09/24/20 with L-sided weakness and AMS. Head CT and brain MRI negative for acute stroke.  MRA head/neck negative for LVO. Pt found to have AKI on CKD stage II-IIIa, and felt weakness due to recrudescence of stroke symptoms  PMH: dementia, brainstem stroke, meningioma, non-insulin-dependent type II diabetes, glaucoma, hypertension, hyperlipidemia, CKD stage II-IIIa   OT comments  Pt making progress with functional goals. Session focused on sit - stand, functional mobility with RW to bathroom, toilet transfers, toileting tasks, UB and LB dressing and grooming standing at sink. Pt bal eto complete min A sit - stand and with transfer to toilet, however pt require heavy mod A for ambulating with RW form recliner into bathroom with multimodal cues for staying close to RW, not to drag R foot and for overall safety. OT will continue to follow acutely to maximize level of function and safety  Follow Up Recommendations  SNF;Supervision/Assistance - 24 hour    Equipment Recommendations  3 in 1 bedside commode;Other (comment) (TBD at SNF)    Recommendations for Other Services      Precautions / Restrictions Precautions Precautions: Fall Restrictions Weight Bearing Restrictions: No       Mobility Bed Mobility               General bed mobility comments: pt in recliner upon arrival    Transfers Overall transfer level: Needs assistance Equipment used: Rolling walker (2 wheeled) Transfers: Sit to/from Stand Sit to Stand: Min assist         General transfer comment: min A sit - stand and with transfer to toilet, however pt require heavy mod A for ambulating with RW form recliner into bathroom with multimodal cues for staying close to RW, not to drag R foot and for overall safety    Balance Overall  balance assessment: Needs assistance Sitting-balance support: Feet supported Sitting balance-Leahy Scale: Fair     Standing balance support: Bilateral upper extremity supported;During functional activity Standing balance-Leahy Scale: Poor                             ADL either performed or assessed with clinical judgement   ADL Overall ADL's : Needs assistance/impaired Eating/Feeding: Set up;Supervision/ safety;Sitting   Grooming: Wash/dry hands;Wash/dry face;Brushing hair;Minimal assistance;Standing           Upper Body Dressing : Minimal assistance;Sitting   Lower Body Dressing: Maximal assistance;Sit to/from stand   Toilet Transfer: Moderate assistance;Minimal assistance;Ambulation;RW;Comfort height toilet;Grab bars;Cueing for safety;Cueing for sequencing   Toileting- Clothing Manipulation and Hygiene: Maximal assistance;Sit to/from stand       Functional mobility during ADLs: Moderate assistance;Minimal assistance;Rolling walker;Cueing for safety;Cueing for sequencing General ADL Comments: pt requires assist for balance, problem solving, sequencing, and safety     Vision Patient Visual Report: No change from baseline     Perception     Praxis      Cognition Arousal/Alertness: Awake/alert Behavior During Therapy: WFL for tasks assessed/performed Overall Cognitive Status: No family/caregiver present to determine baseline cognitive functioning                       Memory: Decreased short-term memory Following Commands: Follows one step commands consistently;Follows one step commands with increased time Safety/Judgement: Decreased awareness of safety;Decreased awareness of  deficits   Problem Solving: Slow processing;Decreased initiation;Difficulty sequencing;Requires verbal cues General Comments: Pt very slow to process and respond to cues/questions. Appears to have difficulty finding words at times. Poor awareness of deficits and safety,  dragging L foot often, needing cues to attend to it. Also, displays poor memory of cues to remain close to RW during mobility        Exercises     Shoulder Instructions       General Comments      Pertinent Vitals/ Pain       Pain Assessment: Faces Pain Score: 0-No pain Faces Pain Scale: No hurt Pain Intervention(s): Monitored during session;Repositioned  Home Living                                          Prior Functioning/Environment              Frequency  Min 2X/week        Progress Toward Goals  OT Goals(current goals can now be found in the care plan section)  Progress towards OT goals: Progressing toward goals  Acute Rehab OT Goals Patient Stated Goal: to walk  Plan Discharge plan remains appropriate    Co-evaluation                 AM-PAC OT "6 Clicks" Daily Activity     Outcome Measure   Help from another person eating meals?: A Little Help from another person taking care of personal grooming?: A Little Help from another person toileting, which includes using toliet, bedpan, or urinal?: A Lot Help from another person bathing (including washing, rinsing, drying)?: A Lot Help from another person to put on and taking off regular upper body clothing?: A Little Help from another person to put on and taking off regular lower body clothing?: A Lot 6 Click Score: 15    End of Session Equipment Utilized During Treatment: Gait belt;Rolling walker  OT Visit Diagnosis: Unsteadiness on feet (R26.81);Cognitive communication deficit (R41.841)   Activity Tolerance Patient tolerated treatment well   Patient Left in chair;with call bell/phone within reach;with chair alarm set;with nursing/sitter in room   Nurse Communication Mobility status        Time: 0370-4888 OT Time Calculation (min): 24 min  Charges: OT General Charges $OT Visit: 1 Visit OT Treatments $Self Care/Home Management : 8-22 mins $Therapeutic Activity: 8-22  mins     Britt Bottom 09/26/2020, 1:49 PM

## 2020-09-26 NOTE — TOC Initial Note (Signed)
Transition of Care [facility]) - Initial/Assessment Note    Patient Details  Name: James Henderson MRN: [id number] Date of Birth: 01-18-1951  [facility] CM/SW Contact:    Emeterio Reeve, LCSW Phone Number: 09/26/2020, 2:31 PM  Clinical Narrative:                  CSW spoke to pt and wife at bedside. Wife reports PTA pt lived at home with her and 2 minor grand children. Pt used a walker but was able to complete ADLs mostly independent. Wife reports she is home 24/7, her grandchildren are very helpful, and her son visits daily.   CSW reviewed SNF reccs. Wife stated pt will not go to SNF and will return home.  Wife reports they have plenty of family support and DME. Wife is open to HHPT, she has used [facility] in the past but is open to other facilities if needed. Pt is in agreement with wife. CSW informed NCM.   Expected Discharge Plan: [place] Barriers to Discharge: Continued Medical Work up   Patient Goals and CMS Choice Patient states their goals for this hospitalization and ongoing recovery are:: To return home with wife CMS Medicare.gov Compare Post Acute Care list provided to:: Patient Choice offered to / list presented to : Patient  Expected Discharge Plan and Services Expected Discharge Plan: [place]       Living arrangements for the past 2 months: Single Family Home                                      Prior Living Arrangements/Services Living arrangements for the past 2 months: Single Family Home Lives with:: Spouse, Minor Children Patient language and need for interpreter reviewed:: Yes Do you feel safe going back to the place where you live?: Yes      Need for Family Participation in Patient Care: Yes (Comment) Care giver support system in place?: Yes (comment)      Activities of Daily Living Home Assistive Devices/Equipment: Cane (specify quad or straight), Walker (specify type) ADL Screening (condition at  time of admission) Patient's cognitive ability adequate to safely complete daily activities?: Yes Is the patient deaf or have difficulty hearing?: Yes Does the patient have difficulty seeing, even when wearing glasses/contacts?: No Does the patient have difficulty concentrating, remembering, or making decisions?: Yes Patient able to express need for assistance with ADLs?: Yes Does the patient have difficulty dressing or bathing?: No Independently performs ADLs?: Yes (appropriate for developmental age) Does the patient have difficulty walking or climbing stairs?: Yes Weakness of Legs: Both Weakness of Arms/Hands: Both  Permission Sought/Granted Permission sought to share information with : Family Supports Permission granted to share information with : Yes, Verbal Permission Granted        Permission granted to share info w Relationship: wife     Emotional Assessment Appearance:: Appears stated age Attitude/Demeanor/Rapport: Engaged Affect (typically observed): Appropriate Orientation: : Oriented to Self, Oriented to Place, Oriented to  Time, Oriented to Situation   Psych Involvement: No (comment)  Admission diagnosis:  Weakness [R53.1] AKI (acute kidney injury) ([place]) [N17.9] Patient Active Problem List   Diagnosis Date Noted   Left-sided weakness 09/25/2020   AKI (acute kidney injury) ([place]) 09/24/2020   Diabetes mellitus type 2 in nonobese ([place])    Benign essential HTN    History of CVA (  cerebrovascular accident)    Acute blood loss anemia    Stage 3 chronic kidney disease ([place])    Acute CVA (cerebrovascular accident) ([place]) 10/21/2017   HLD (hyperlipidemia) 01/10/2014   Acute brainstem infarction ([place]) 12/07/2013   Dizziness 12/06/2013   DM (diabetes mellitus) type II controlled, neurological manifestation ([place]) 12/06/2013   HYPERLIPIDEMIA 02/26/2006   GLAUCOMA NOS 02/26/2006   Essential hypertension 02/26/2006   PCP:  Lois Huxley, PA Pharmacy:   [place], [place] - [address] [place] [address] Phone: [phone] Fax: [facility] [phone] - [address] [place] [address] Phone: [phone] Fax: [phone]     Social Determinants of Health (SDOH) Interventions    Readmission Risk Interventions No flowsheet data found.  Emeterio Reeve, Latanya Presser, [place] Social Worker [phone]

## 2020-10-03 DIAGNOSIS — Z7902 Long term (current) use of antithrombotics/antiplatelets: Secondary | ICD-10-CM | POA: Diagnosis not present

## 2020-10-03 DIAGNOSIS — Z9181 History of falling: Secondary | ICD-10-CM | POA: Diagnosis not present

## 2020-10-03 DIAGNOSIS — N3281 Overactive bladder: Secondary | ICD-10-CM | POA: Diagnosis not present

## 2020-10-03 DIAGNOSIS — H409 Unspecified glaucoma: Secondary | ICD-10-CM | POA: Diagnosis not present

## 2020-10-03 DIAGNOSIS — E1122 Type 2 diabetes mellitus with diabetic chronic kidney disease: Secondary | ICD-10-CM | POA: Diagnosis not present

## 2020-10-03 DIAGNOSIS — E785 Hyperlipidemia, unspecified: Secondary | ICD-10-CM | POA: Diagnosis not present

## 2020-10-03 DIAGNOSIS — N179 Acute kidney failure, unspecified: Secondary | ICD-10-CM | POA: Diagnosis not present

## 2020-10-03 DIAGNOSIS — Z86011 Personal history of benign neoplasm of the brain: Secondary | ICD-10-CM | POA: Diagnosis not present

## 2020-10-03 DIAGNOSIS — N1831 Chronic kidney disease, stage 3a: Secondary | ICD-10-CM | POA: Diagnosis not present

## 2020-10-03 DIAGNOSIS — I129 Hypertensive chronic kidney disease with stage 1 through stage 4 chronic kidney disease, or unspecified chronic kidney disease: Secondary | ICD-10-CM | POA: Diagnosis not present

## 2020-10-03 DIAGNOSIS — I69354 Hemiplegia and hemiparesis following cerebral infarction affecting left non-dominant side: Secondary | ICD-10-CM | POA: Diagnosis not present

## 2020-10-03 DIAGNOSIS — F0390 Unspecified dementia without behavioral disturbance: Secondary | ICD-10-CM | POA: Diagnosis not present

## 2020-10-03 DIAGNOSIS — Z7984 Long term (current) use of oral hypoglycemic drugs: Secondary | ICD-10-CM | POA: Diagnosis not present

## 2020-10-04 NOTE — Progress Notes (Signed)
NEUROLOGY FOLLOW UP OFFICE NOTE  James Henderson [id number]  Assessment/Plan:   Mixed vascular and Alzheimer's dementia History of stroke Hypertension Hyperlipidemia Type 2 diabetes mellitus Cerebral meningioma  Refilled donepezil 10mg  at bedtime Secondary stroke prevention as managed by PCP: -  Plavix 75mg  daily -  Statin therapy.  LDL goal less than 70 -  Normotensive blood pressure.  -  Glycemic control.  Hgb A1c goal less than 7 3. Follow up one year.   Subjective:  James Henderson is a 70 year old right-handed man with hypertension, hyperlipidemia, type 2 diabetes, cerebral meningioma and history of brainstem stroke who follows up for dementia.  He is accompanied by his wife who supplements history.   UPDATE: Current medications:  Aricept 10mg  QHS (ran out a while ago), Plavix 75mg , Crestor 40mg , glipizide, losartan   MRI of brain with and without contrast on 02/27/2020 personally reviewed showed incidental punctate acute infarct in the left hemispheric white matter as well as advanced chronic small vessel disease and slight parietal predominance atrophy.  B12 and TSH were ordered but appears to never have been performed.    He was admitted to [facility] on 09/24/2020 for confusion and left sided weakness.  Creatine was elevated at 1.6 but otherwise CMP and CBC unremarkable with negative UA, COVID and ammonia. CT head and MRI of brain personally reviewed was negative for acute stroke.  MRA of head and neck personally reviewed showed no hemodynamically significant stenosis or large vessel occlusion.  EEG showed mild diffuse slowing but no epileptiform discharges or electrographic seizures.  Symptoms felt to be metabolic encephalopathy with recrudescence of previous stroke symptoms due to AKI from dehydration.  He is eating well.  Since hospitalization, he has increased his water intake.   HISTORY: Mr. James Henderson has a history of prior small midbrain infarct in 2015.  He was  admitted to [facility] from 10/21/2017 to 10/24/2017 after presenting with dizziness and ataxia.  CTA of the head and neck revealed diffuse intracranial atherosclerotic disease and mild atherosclerotic disease in both carotid arteries but no significant stenosis or large vessel occlusion.  MRI of the brain showed moderate atrophy with chronic small vessel ischemic changes as well as extensive chronic microhemorrhages as well as left lateral tentorium 8 x 54mm meningioma (stable since 2015) but no acute infarct.  He had a repeat MRI which then demonstrated acute 5 mm infarct in the left brachium pontis.  2D echocardiogram demonstrated ejection fraction 55 to 60% but was indeterminant for evaluation of LV diastolic function.  LDL was 82.  Hemoglobin A1c was 6.2.  He was previously on aspirin 81 mg daily prior to admission.  He was discharged on Plavix 75 mg daily (dual antiplatelet therapy was not initiated due to evidence of microhemorrhages) and Crestor 40 mg daily.    He started having memory problems in 2021 and has gradually gotten worse.  He frequently misplaces objects.  He can no longer pay bills independently.  He forgets to make monthly payments on the mortgage and car.  He forgets to eat.  He ambulates more slowly.  He is now getting disoriented driving on familiar routes.  He has forgotten how to get to his doctor's offices.     PAST MEDICAL HISTORY: Past Medical History:  Diagnosis Date   Brainstem stroke ([place]) 2015   Cerebral meningioma ([place])    Diabetes mellitus without complication ([place])    Glaucoma    Hyperlipidemia    Hypertension  MEDICATIONS: Current Outpatient Medications on File Prior to Visit  Medication Sig Dispense Refill   acetaminophen (TYLENOL) 500 MG tablet Take 1,000 mg by mouth every 6 (six) hours as needed for headache (pain).     amLODipine (NORVASC) 5 MG tablet Take 5 mg by mouth daily.     Brinzolamide-Brimonidine 1-0.2 % SUSP Place 1 drop into both eyes 2  (two) times daily.      Cholecalciferol (VITAMIN D-3) 125 MCG (5000 UT) TABS Take 5,000 Units by mouth daily.     clopidogrel (PLAVIX) 75 MG tablet Take 1 tablet (75 mg total) by mouth daily. 30 tablet 0   donepezil (ARICEPT) 10 MG tablet Take 1 tablet (10 mg total) by mouth at bedtime. 30 tablet 2   ezetimibe (ZETIA) 10 MG tablet Take 10 mg by mouth daily.     glipiZIDE (GLUCOTROL) 5 MG tablet Take 5 mg by mouth daily.     Inositol Niacinate 500 MG CAPS Take 500 mg by mouth every evening.     losartan (COZAAR) 100 MG tablet Take 100 mg by mouth daily.  1   metFORMIN (GLUCOPHAGE) 1000 MG tablet Take 500-1,000 mg by mouth See admin instructions. Take1000 mg in the morning and 500 mg in the evening  1   Omega-3 Fatty Acids (FISH OIL) 500 MG CAPS Take 500 mg by mouth every evening.     rosuvastatin (CRESTOR) 40 MG tablet Take 1 tablet (40 mg total) by mouth daily at 6 PM. 30 tablet 0   tolterodine (DETROL LA) 4 MG 24 hr capsule Take 4 mg by mouth daily.     Vibegron (GEMTESA) 75 MG TABS Take 75 mg by mouth daily.     No current facility-administered medications on file prior to visit.    ALLERGIES: No Known Allergies  FAMILY HISTORY: Family History  Problem Relation Age of Onset   Stroke Mother    Stroke Brother    Diabetes type II Brother       Objective:  Blood pressure (!) 153/91, pulse 77, height 5\' 6"  (1.676 m), weight 164 lb (74.4 kg), SpO2 98 %. General: No acute distress.  Patient appears well-groomed.   Head:  Normocephalic/atraumatic Eyes:  Fundi examined but not visualized Neck: supple, no paraspinal tenderness, full range of motion Heart:  Regular rate and rhythm Lungs:  Clear to auscultation bilaterally Back: No paraspinal tenderness Neurological Exam: alert and oriented to person, place, and time.  Speech fluent and not dysarthric, language intact.  CN II-XII intact. Bulk and tone normal, muscle strength 5/5 throughout.  Sensation to light touch intact.  Deep tendon  reflexes 1+ throughout, toes downgoing.  Finger to nose testing intact.  Broad-based gait.     Metta Clines, DO  CC:  Marilynne Drivers, PA

## 2020-10-05 ENCOUNTER — Ambulatory Visit: Payer: HMO | Admitting: Neurology

## 2020-10-05 ENCOUNTER — Other Ambulatory Visit: Payer: Self-pay

## 2020-10-05 ENCOUNTER — Encounter: Payer: Self-pay | Admitting: Neurology

## 2020-10-05 VITALS — BP 153/91 | HR 77 | Ht 66.0 in | Wt 164.0 lb

## 2020-10-05 DIAGNOSIS — Z8673 Personal history of transient ischemic attack (TIA), and cerebral infarction without residual deficits: Secondary | ICD-10-CM

## 2020-10-05 DIAGNOSIS — E785 Hyperlipidemia, unspecified: Secondary | ICD-10-CM | POA: Diagnosis not present

## 2020-10-05 DIAGNOSIS — F0150 Vascular dementia without behavioral disturbance: Secondary | ICD-10-CM | POA: Diagnosis not present

## 2020-10-05 DIAGNOSIS — I10 Essential (primary) hypertension: Secondary | ICD-10-CM | POA: Diagnosis not present

## 2020-10-05 DIAGNOSIS — D320 Benign neoplasm of cerebral meninges: Secondary | ICD-10-CM | POA: Diagnosis not present

## 2020-10-05 DIAGNOSIS — F0280 Dementia in other diseases classified elsewhere without behavioral disturbance: Secondary | ICD-10-CM

## 2020-10-05 DIAGNOSIS — G309 Alzheimer's disease, unspecified: Secondary | ICD-10-CM

## 2020-10-05 DIAGNOSIS — E119 Type 2 diabetes mellitus without complications: Secondary | ICD-10-CM

## 2020-10-05 MED ORDER — DONEPEZIL HCL 10 MG PO TABS: 10.0000 mg | ORAL_TABLET | Freq: Every day | ORAL | 5 refills | Status: DC

## 2020-10-05 NOTE — Patient Instructions (Signed)
Refilled donepezil No change in other medications Follow up one year

## 2020-10-18 DIAGNOSIS — N401 Enlarged prostate with lower urinary tract symptoms: Secondary | ICD-10-CM | POA: Diagnosis not present

## 2020-10-18 DIAGNOSIS — I10 Essential (primary) hypertension: Secondary | ICD-10-CM | POA: Diagnosis not present

## 2020-10-18 DIAGNOSIS — I639 Cerebral infarction, unspecified: Secondary | ICD-10-CM | POA: Diagnosis not present

## 2020-10-18 DIAGNOSIS — E1122 Type 2 diabetes mellitus with diabetic chronic kidney disease: Secondary | ICD-10-CM | POA: Diagnosis not present

## 2020-10-18 DIAGNOSIS — F0390 Unspecified dementia without behavioral disturbance: Secondary | ICD-10-CM | POA: Diagnosis not present

## 2020-10-18 DIAGNOSIS — E7800 Pure hypercholesterolemia, unspecified: Secondary | ICD-10-CM | POA: Diagnosis not present

## 2020-10-18 DIAGNOSIS — E119 Type 2 diabetes mellitus without complications: Secondary | ICD-10-CM | POA: Diagnosis not present

## 2020-10-18 DIAGNOSIS — H409 Unspecified glaucoma: Secondary | ICD-10-CM | POA: Diagnosis not present

## 2020-10-18 DIAGNOSIS — N183 Chronic kidney disease, stage 3 unspecified: Secondary | ICD-10-CM | POA: Diagnosis not present

## 2020-10-19 DIAGNOSIS — E1122 Type 2 diabetes mellitus with diabetic chronic kidney disease: Secondary | ICD-10-CM | POA: Diagnosis not present

## 2020-10-19 DIAGNOSIS — E7800 Pure hypercholesterolemia, unspecified: Secondary | ICD-10-CM | POA: Diagnosis not present

## 2020-10-19 DIAGNOSIS — N179 Acute kidney failure, unspecified: Secondary | ICD-10-CM | POA: Diagnosis not present

## 2020-10-19 DIAGNOSIS — R531 Weakness: Secondary | ICD-10-CM | POA: Diagnosis not present

## 2020-10-19 DIAGNOSIS — Z8673 Personal history of transient ischemic attack (TIA), and cerebral infarction without residual deficits: Secondary | ICD-10-CM | POA: Diagnosis not present

## 2020-10-19 DIAGNOSIS — R159 Full incontinence of feces: Secondary | ICD-10-CM | POA: Diagnosis not present

## 2020-10-19 DIAGNOSIS — F0390 Unspecified dementia without behavioral disturbance: Secondary | ICD-10-CM | POA: Diagnosis not present

## 2020-10-19 DIAGNOSIS — I10 Essential (primary) hypertension: Secondary | ICD-10-CM | POA: Diagnosis not present

## 2020-10-19 DIAGNOSIS — R32 Unspecified urinary incontinence: Secondary | ICD-10-CM | POA: Diagnosis not present

## 2020-10-27 DIAGNOSIS — Z7902 Long term (current) use of antithrombotics/antiplatelets: Secondary | ICD-10-CM | POA: Diagnosis not present

## 2020-10-27 DIAGNOSIS — N3281 Overactive bladder: Secondary | ICD-10-CM | POA: Diagnosis not present

## 2020-10-27 DIAGNOSIS — Z7984 Long term (current) use of oral hypoglycemic drugs: Secondary | ICD-10-CM | POA: Diagnosis not present

## 2020-10-27 DIAGNOSIS — N1831 Chronic kidney disease, stage 3a: Secondary | ICD-10-CM | POA: Diagnosis not present

## 2020-10-27 DIAGNOSIS — F0390 Unspecified dementia without behavioral disturbance: Secondary | ICD-10-CM | POA: Diagnosis not present

## 2020-10-27 DIAGNOSIS — I69354 Hemiplegia and hemiparesis following cerebral infarction affecting left non-dominant side: Secondary | ICD-10-CM | POA: Diagnosis not present

## 2020-10-27 DIAGNOSIS — E1122 Type 2 diabetes mellitus with diabetic chronic kidney disease: Secondary | ICD-10-CM | POA: Diagnosis not present

## 2020-10-27 DIAGNOSIS — N179 Acute kidney failure, unspecified: Secondary | ICD-10-CM | POA: Diagnosis not present

## 2020-10-27 DIAGNOSIS — Z86011 Personal history of benign neoplasm of the brain: Secondary | ICD-10-CM | POA: Diagnosis not present

## 2020-10-27 DIAGNOSIS — H409 Unspecified glaucoma: Secondary | ICD-10-CM | POA: Diagnosis not present

## 2020-10-27 DIAGNOSIS — Z9181 History of falling: Secondary | ICD-10-CM | POA: Diagnosis not present

## 2020-10-27 DIAGNOSIS — E785 Hyperlipidemia, unspecified: Secondary | ICD-10-CM | POA: Diagnosis not present

## 2020-10-27 DIAGNOSIS — I129 Hypertensive chronic kidney disease with stage 1 through stage 4 chronic kidney disease, or unspecified chronic kidney disease: Secondary | ICD-10-CM | POA: Diagnosis not present

## 2020-11-09 DIAGNOSIS — I69354 Hemiplegia and hemiparesis following cerebral infarction affecting left non-dominant side: Secondary | ICD-10-CM | POA: Diagnosis not present

## 2020-11-09 DIAGNOSIS — N179 Acute kidney failure, unspecified: Secondary | ICD-10-CM | POA: Diagnosis not present

## 2020-11-09 DIAGNOSIS — Z7984 Long term (current) use of oral hypoglycemic drugs: Secondary | ICD-10-CM | POA: Diagnosis not present

## 2020-11-09 DIAGNOSIS — I129 Hypertensive chronic kidney disease with stage 1 through stage 4 chronic kidney disease, or unspecified chronic kidney disease: Secondary | ICD-10-CM | POA: Diagnosis not present

## 2020-11-09 DIAGNOSIS — H409 Unspecified glaucoma: Secondary | ICD-10-CM | POA: Diagnosis not present

## 2020-11-09 DIAGNOSIS — E1122 Type 2 diabetes mellitus with diabetic chronic kidney disease: Secondary | ICD-10-CM | POA: Diagnosis not present

## 2020-11-09 DIAGNOSIS — E785 Hyperlipidemia, unspecified: Secondary | ICD-10-CM | POA: Diagnosis not present

## 2020-11-09 DIAGNOSIS — N1831 Chronic kidney disease, stage 3a: Secondary | ICD-10-CM | POA: Diagnosis not present

## 2020-11-09 DIAGNOSIS — Z9181 History of falling: Secondary | ICD-10-CM | POA: Diagnosis not present

## 2020-11-09 DIAGNOSIS — Z7902 Long term (current) use of antithrombotics/antiplatelets: Secondary | ICD-10-CM | POA: Diagnosis not present

## 2020-11-09 DIAGNOSIS — N3281 Overactive bladder: Secondary | ICD-10-CM | POA: Diagnosis not present

## 2020-11-09 DIAGNOSIS — Z86011 Personal history of benign neoplasm of the brain: Secondary | ICD-10-CM | POA: Diagnosis not present

## 2020-11-09 DIAGNOSIS — F0390 Unspecified dementia without behavioral disturbance: Secondary | ICD-10-CM | POA: Diagnosis not present

## 2020-11-22 DIAGNOSIS — N1831 Chronic kidney disease, stage 3a: Secondary | ICD-10-CM | POA: Diagnosis not present

## 2020-11-22 DIAGNOSIS — E1122 Type 2 diabetes mellitus with diabetic chronic kidney disease: Secondary | ICD-10-CM | POA: Diagnosis not present

## 2020-11-22 DIAGNOSIS — H409 Unspecified glaucoma: Secondary | ICD-10-CM | POA: Diagnosis not present

## 2020-11-22 DIAGNOSIS — Z86011 Personal history of benign neoplasm of the brain: Secondary | ICD-10-CM | POA: Diagnosis not present

## 2020-11-22 DIAGNOSIS — E785 Hyperlipidemia, unspecified: Secondary | ICD-10-CM | POA: Diagnosis not present

## 2020-11-22 DIAGNOSIS — Z9181 History of falling: Secondary | ICD-10-CM | POA: Diagnosis not present

## 2020-11-22 DIAGNOSIS — F0390 Unspecified dementia without behavioral disturbance: Secondary | ICD-10-CM | POA: Diagnosis not present

## 2020-11-22 DIAGNOSIS — I69354 Hemiplegia and hemiparesis following cerebral infarction affecting left non-dominant side: Secondary | ICD-10-CM | POA: Diagnosis not present

## 2020-11-22 DIAGNOSIS — N3281 Overactive bladder: Secondary | ICD-10-CM | POA: Diagnosis not present

## 2020-11-22 DIAGNOSIS — Z7984 Long term (current) use of oral hypoglycemic drugs: Secondary | ICD-10-CM | POA: Diagnosis not present

## 2020-11-22 DIAGNOSIS — N179 Acute kidney failure, unspecified: Secondary | ICD-10-CM | POA: Diagnosis not present

## 2020-11-22 DIAGNOSIS — I129 Hypertensive chronic kidney disease with stage 1 through stage 4 chronic kidney disease, or unspecified chronic kidney disease: Secondary | ICD-10-CM | POA: Diagnosis not present

## 2020-11-22 DIAGNOSIS — Z7902 Long term (current) use of antithrombotics/antiplatelets: Secondary | ICD-10-CM | POA: Diagnosis not present

## 2020-12-14 DIAGNOSIS — Z23 Encounter for immunization: Secondary | ICD-10-CM | POA: Diagnosis not present

## 2021-01-08 ENCOUNTER — Other Ambulatory Visit: Payer: Self-pay | Admitting: Neurology

## 2021-01-16 DIAGNOSIS — F0390 Unspecified dementia without behavioral disturbance: Secondary | ICD-10-CM | POA: Diagnosis not present

## 2021-01-16 DIAGNOSIS — E119 Type 2 diabetes mellitus without complications: Secondary | ICD-10-CM | POA: Diagnosis not present

## 2021-01-16 DIAGNOSIS — E7800 Pure hypercholesterolemia, unspecified: Secondary | ICD-10-CM | POA: Diagnosis not present

## 2021-01-16 DIAGNOSIS — H409 Unspecified glaucoma: Secondary | ICD-10-CM | POA: Diagnosis not present

## 2021-01-16 DIAGNOSIS — N401 Enlarged prostate with lower urinary tract symptoms: Secondary | ICD-10-CM | POA: Diagnosis not present

## 2021-01-16 DIAGNOSIS — I10 Essential (primary) hypertension: Secondary | ICD-10-CM | POA: Diagnosis not present

## 2021-01-16 DIAGNOSIS — N183 Chronic kidney disease, stage 3 unspecified: Secondary | ICD-10-CM | POA: Diagnosis not present

## 2021-01-16 DIAGNOSIS — E1122 Type 2 diabetes mellitus with diabetic chronic kidney disease: Secondary | ICD-10-CM | POA: Diagnosis not present

## 2021-01-20 DIAGNOSIS — H409 Unspecified glaucoma: Secondary | ICD-10-CM | POA: Diagnosis not present

## 2021-01-20 DIAGNOSIS — N3941 Urge incontinence: Secondary | ICD-10-CM | POA: Diagnosis not present

## 2021-01-20 DIAGNOSIS — E7800 Pure hypercholesterolemia, unspecified: Secondary | ICD-10-CM | POA: Diagnosis not present

## 2021-01-20 DIAGNOSIS — Z0000 Encounter for general adult medical examination without abnormal findings: Secondary | ICD-10-CM | POA: Diagnosis not present

## 2021-01-20 DIAGNOSIS — Z1389 Encounter for screening for other disorder: Secondary | ICD-10-CM | POA: Diagnosis not present

## 2021-01-20 DIAGNOSIS — E1122 Type 2 diabetes mellitus with diabetic chronic kidney disease: Secondary | ICD-10-CM | POA: Diagnosis not present

## 2021-01-20 DIAGNOSIS — N183 Chronic kidney disease, stage 3 unspecified: Secondary | ICD-10-CM | POA: Diagnosis not present

## 2021-01-20 DIAGNOSIS — F0390 Unspecified dementia without behavioral disturbance: Secondary | ICD-10-CM | POA: Diagnosis not present

## 2021-01-20 DIAGNOSIS — Z8673 Personal history of transient ischemic attack (TIA), and cerebral infarction without residual deficits: Secondary | ICD-10-CM | POA: Diagnosis not present

## 2021-01-20 DIAGNOSIS — I10 Essential (primary) hypertension: Secondary | ICD-10-CM | POA: Diagnosis not present

## 2021-01-30 DIAGNOSIS — I10 Essential (primary) hypertension: Secondary | ICD-10-CM | POA: Diagnosis not present

## 2021-01-30 DIAGNOSIS — N401 Enlarged prostate with lower urinary tract symptoms: Secondary | ICD-10-CM | POA: Diagnosis not present

## 2021-01-30 DIAGNOSIS — E1122 Type 2 diabetes mellitus with diabetic chronic kidney disease: Secondary | ICD-10-CM | POA: Diagnosis not present

## 2021-01-30 DIAGNOSIS — N183 Chronic kidney disease, stage 3 unspecified: Secondary | ICD-10-CM | POA: Diagnosis not present

## 2021-01-30 DIAGNOSIS — F0390 Unspecified dementia without behavioral disturbance: Secondary | ICD-10-CM | POA: Diagnosis not present

## 2021-01-30 DIAGNOSIS — E119 Type 2 diabetes mellitus without complications: Secondary | ICD-10-CM | POA: Diagnosis not present

## 2021-01-30 DIAGNOSIS — E7800 Pure hypercholesterolemia, unspecified: Secondary | ICD-10-CM | POA: Diagnosis not present

## 2021-01-30 DIAGNOSIS — I639 Cerebral infarction, unspecified: Secondary | ICD-10-CM | POA: Diagnosis not present

## 2021-03-10 ENCOUNTER — Other Ambulatory Visit: Payer: Self-pay

## 2021-03-10 ENCOUNTER — Observation Stay (HOSPITAL_COMMUNITY): Payer: HMO

## 2021-03-10 ENCOUNTER — Observation Stay (HOSPITAL_COMMUNITY)
Admission: EM | Admit: 2021-03-10 | Discharge: 2021-03-12 | Disposition: A | Discharge status: Home / Self Care | Payer: HMO | Attending: Family Medicine | Admitting: Family Medicine

## 2021-03-10 ENCOUNTER — Encounter (HOSPITAL_COMMUNITY): Payer: Self-pay

## 2021-03-10 ENCOUNTER — Other Ambulatory Visit (HOSPITAL_COMMUNITY): Payer: HMO

## 2021-03-10 ENCOUNTER — Emergency Department (HOSPITAL_COMMUNITY): Payer: HMO

## 2021-03-10 DIAGNOSIS — R269 Unspecified abnormalities of gait and mobility: Principal | ICD-10-CM | POA: Insufficient documentation

## 2021-03-10 DIAGNOSIS — I129 Hypertensive chronic kidney disease with stage 1 through stage 4 chronic kidney disease, or unspecified chronic kidney disease: Secondary | ICD-10-CM | POA: Insufficient documentation

## 2021-03-10 DIAGNOSIS — R42 Dizziness and giddiness: Secondary | ICD-10-CM | POA: Diagnosis not present

## 2021-03-10 DIAGNOSIS — Z8673 Personal history of transient ischemic attack (TIA), and cerebral infarction without residual deficits: Secondary | ICD-10-CM | POA: Insufficient documentation

## 2021-03-10 DIAGNOSIS — E1122 Type 2 diabetes mellitus with diabetic chronic kidney disease: Secondary | ICD-10-CM | POA: Insufficient documentation

## 2021-03-10 DIAGNOSIS — G309 Alzheimer's disease, unspecified: Secondary | ICD-10-CM | POA: Insufficient documentation

## 2021-03-10 DIAGNOSIS — R41841 Cognitive communication deficit: Secondary | ICD-10-CM | POA: Diagnosis not present

## 2021-03-10 DIAGNOSIS — Z87891 Personal history of nicotine dependence: Secondary | ICD-10-CM | POA: Diagnosis not present

## 2021-03-10 DIAGNOSIS — E1149 Type 2 diabetes mellitus with other diabetic neurological complication: Secondary | ICD-10-CM | POA: Diagnosis present

## 2021-03-10 DIAGNOSIS — N1831 Chronic kidney disease, stage 3a: Secondary | ICD-10-CM | POA: Diagnosis not present

## 2021-03-10 DIAGNOSIS — N183 Chronic kidney disease, stage 3 unspecified: Secondary | ICD-10-CM | POA: Diagnosis present

## 2021-03-10 DIAGNOSIS — I10 Essential (primary) hypertension: Secondary | ICD-10-CM | POA: Diagnosis present

## 2021-03-10 DIAGNOSIS — E785 Hyperlipidemia, unspecified: Secondary | ICD-10-CM | POA: Diagnosis present

## 2021-03-10 DIAGNOSIS — Z79899 Other long term (current) drug therapy: Secondary | ICD-10-CM | POA: Diagnosis not present

## 2021-03-10 DIAGNOSIS — Z9181 History of falling: Secondary | ICD-10-CM | POA: Diagnosis not present

## 2021-03-10 DIAGNOSIS — Z7984 Long term (current) use of oral hypoglycemic drugs: Secondary | ICD-10-CM | POA: Diagnosis not present

## 2021-03-10 DIAGNOSIS — R531 Weakness: Secondary | ICD-10-CM | POA: Diagnosis not present

## 2021-03-10 DIAGNOSIS — Z20822 Contact with and (suspected) exposure to covid-19: Secondary | ICD-10-CM | POA: Insufficient documentation

## 2021-03-10 DIAGNOSIS — E119 Type 2 diabetes mellitus without complications: Secondary | ICD-10-CM

## 2021-03-10 DIAGNOSIS — N3281 Overactive bladder: Secondary | ICD-10-CM | POA: Diagnosis not present

## 2021-03-10 DIAGNOSIS — R2681 Unsteadiness on feet: Secondary | ICD-10-CM | POA: Insufficient documentation

## 2021-03-10 DIAGNOSIS — F0150 Vascular dementia without behavioral disturbance: Secondary | ICD-10-CM | POA: Insufficient documentation

## 2021-03-10 DIAGNOSIS — I639 Cerebral infarction, unspecified: Secondary | ICD-10-CM

## 2021-03-10 DIAGNOSIS — R29818 Other symptoms and signs involving the nervous system: Secondary | ICD-10-CM | POA: Diagnosis not present

## 2021-03-10 LAB — APTT: aPTT: 29 seconds (ref 24–36)

## 2021-03-10 LAB — COMPREHENSIVE METABOLIC PANEL
ALT: 14 U/L (ref 0–44)
AST: 18 U/L (ref 15–41)
Albumin: 3.6 g/dL (ref 3.5–5.0)
Alkaline Phosphatase: 106 U/L (ref 38–126)
Anion gap: 7 (ref 5–15)
BUN: 12 mg/dL (ref 8–23)
CO2: 23 mmol/L (ref 22–32)
Calcium: 8.9 mg/dL (ref 8.9–10.3)
Chloride: 110 mmol/L (ref 98–111)
Creatinine, Ser: 1.36 mg/dL — ABNORMAL HIGH (ref 0.61–1.24)
GFR, Estimated: 55 mL/min — ABNORMAL LOW (ref >60)
Glucose, Bld: 146 mg/dL — ABNORMAL HIGH (ref 70–99)
Potassium: 3.8 mmol/L (ref 3.5–5.1)
Sodium: 140 mmol/L (ref 135–145)
Total Bilirubin: 0.9 mg/dL (ref 0.3–1.2)
Total Protein: 6.5 g/dL (ref 6.5–8.1)

## 2021-03-10 LAB — CBC WITH DIFFERENTIAL/PLATELET
Abs Immature Granulocytes: 0.02 10*3/uL (ref 0.00–0.07)
Basophils Absolute: 0 10*3/uL (ref 0.0–0.1)
Basophils Relative: 0 %
Eosinophils Absolute: 0.1 10*3/uL (ref 0.0–0.5)
Eosinophils Relative: 1 %
HCT: 36.3 % — ABNORMAL LOW (ref 39.0–52.0)
Hemoglobin: 11.8 g/dL — ABNORMAL LOW (ref 13.0–17.0)
Immature Granulocytes: 1 %
Lymphocytes Relative: 17 %
Lymphs Abs: 0.7 10*3/uL (ref 0.7–4.0)
MCH: 31.1 pg (ref 26.0–34.0)
MCHC: 32.5 g/dL (ref 30.0–36.0)
MCV: 95.5 fL (ref 80.0–100.0)
Monocytes Absolute: 0.5 10*3/uL (ref 0.1–1.0)
Monocytes Relative: 11 %
Neutro Abs: 3 10*3/uL (ref 1.7–7.7)
Neutrophils Relative %: 70 %
Platelets: 206 10*3/uL (ref 150–400)
RBC: 3.8 MIL/uL — ABNORMAL LOW (ref 4.22–5.81)
RDW: 13 % (ref 11.5–15.5)
WBC: 4.2 10*3/uL (ref 4.0–10.5)
nRBC: 0 % (ref 0.0–0.2)

## 2021-03-10 LAB — RESP PANEL BY RT-PCR (FLU A&B, COVID) ARPGX2
Influenza A by PCR: NEGATIVE
Influenza B by PCR: NEGATIVE
SARS Coronavirus 2 by RT PCR: NEGATIVE

## 2021-03-10 LAB — CBG MONITORING, ED: Glucose-Capillary: 91 mg/dL (ref 70–99)

## 2021-03-10 LAB — GLUCOSE, CAPILLARY: Glucose-Capillary: 114 mg/dL — ABNORMAL HIGH (ref 70–99)

## 2021-03-10 LAB — PROTIME-INR
INR: 1.1 (ref 0.8–1.2)
Prothrombin Time: 14.6 seconds (ref 11.4–15.2)

## 2021-03-10 MED ORDER — FESOTERODINE FUMARATE ER 4 MG PO TB24
4.0000 mg | ORAL_TABLET | Freq: Every day | ORAL | Status: DC
  Administered 2021-03-11 – 2021-03-12 (×2): 4 mg via ORAL
  Filled 2021-03-10 (×2): qty 1

## 2021-03-10 MED ORDER — STROKE: EARLY STAGES OF RECOVERY BOOK
Freq: Once | Status: AC
  Filled 2021-03-10: qty 1

## 2021-03-10 MED ORDER — ACETAMINOPHEN 325 MG PO TABS: 650.0000 mg | ORAL_TABLET | ORAL | Status: DC | PRN

## 2021-03-10 MED ORDER — VITAMIN D 25 MCG (1000 UNIT) PO TABS
5000.0000 [IU] | ORAL_TABLET | Freq: Every day | ORAL | Status: DC
  Administered 2021-03-10 – 2021-03-12 (×3): 5000 [IU] via ORAL
  Filled 2021-03-10 (×3): qty 5

## 2021-03-10 MED ORDER — CLOPIDOGREL BISULFATE 75 MG PO TABS
75.0000 mg | ORAL_TABLET | Freq: Every day | ORAL | Status: DC
  Administered 2021-03-11 – 2021-03-12 (×2): 75 mg via ORAL
  Filled 2021-03-10 (×2): qty 1

## 2021-03-10 MED ORDER — SENNOSIDES-DOCUSATE SODIUM 8.6-50 MG PO TABS: 1.0000 | ORAL_TABLET | Freq: Every evening | ORAL | Status: DC | PRN

## 2021-03-10 MED ORDER — EZETIMIBE 10 MG PO TABS
10.0000 mg | ORAL_TABLET | Freq: Every day | ORAL | Status: DC
  Administered 2021-03-11 – 2021-03-12 (×2): 10 mg via ORAL
  Filled 2021-03-10 (×2): qty 1

## 2021-03-10 MED ORDER — ROSUVASTATIN CALCIUM 20 MG PO TABS
40.0000 mg | ORAL_TABLET | Freq: Every day | ORAL | Status: DC
  Administered 2021-03-10 – 2021-03-11 (×2): 40 mg via ORAL
  Filled 2021-03-10 (×2): qty 2

## 2021-03-10 MED ORDER — ACETAMINOPHEN 650 MG RE SUPP: 650.0000 mg | RECTAL | Status: DC | PRN

## 2021-03-10 MED ORDER — INSULIN ASPART 100 UNIT/ML IJ SOLN
0.0000 [IU] | Freq: Three times a day (TID) | INTRAMUSCULAR | Status: DC
  Administered 2021-03-11: 2 [IU] via SUBCUTANEOUS
  Administered 2021-03-12: 1 [IU] via SUBCUTANEOUS
  Administered 2021-03-12: 3 [IU] via SUBCUTANEOUS
  Administered 2021-03-12: 1 [IU] via SUBCUTANEOUS

## 2021-03-10 MED ORDER — BRINZOLAMIDE 1 % OP SUSP
1.0000 [drp] | Freq: Three times a day (TID) | OPHTHALMIC | Status: DC
  Administered 2021-03-10 – 2021-03-12 (×7): 1 [drp] via OPHTHALMIC
  Filled 2021-03-10: qty 10

## 2021-03-10 MED ORDER — GADOBUTROL 1 MMOL/ML IV SOLN
7.4000 mL | Freq: Once | INTRAVENOUS | Status: AC | PRN
  Administered 2021-03-10: 7.4 mL via INTRAVENOUS

## 2021-03-10 MED ORDER — ENOXAPARIN SODIUM 40 MG/0.4ML IJ SOSY
40.0000 mg | PREFILLED_SYRINGE | INTRAMUSCULAR | Status: DC
  Administered 2021-03-11 – 2021-03-12 (×2): 40 mg via SUBCUTANEOUS
  Filled 2021-03-10 (×2): qty 0.4

## 2021-03-10 MED ORDER — BRIMONIDINE TARTRATE 0.2 % OP SOLN
1.0000 [drp] | Freq: Three times a day (TID) | OPHTHALMIC | Status: DC
  Administered 2021-03-10 – 2021-03-12 (×7): 1 [drp] via OPHTHALMIC
  Filled 2021-03-10: qty 5

## 2021-03-10 MED ORDER — DONEPEZIL HCL 10 MG PO TABS
10.0000 mg | ORAL_TABLET | Freq: Every day | ORAL | Status: DC
  Administered 2021-03-10 – 2021-03-11 (×2): 10 mg via ORAL
  Filled 2021-03-10 (×3): qty 1

## 2021-03-10 MED ORDER — ACETAMINOPHEN 160 MG/5ML PO SOLN: 650.0000 mg | ORAL | Status: DC | PRN

## 2021-03-10 NOTE — ED Triage Notes (Signed)
Pt BIB [facility] from home c/o unsteady gait. Pt normally walks with a cane but today woke up and could not get his balance. Pt denies any other complaints or pain. LKW was 9 pm last night.     153/78 98 70 212 CBG

## 2021-03-10 NOTE — ED Notes (Signed)
MD made aware of pt passing swallow screen and need for diet order

## 2021-03-10 NOTE — ED Notes (Signed)
Called no answer

## 2021-03-10 NOTE — ED Notes (Signed)
Pt transported to MRI 

## 2021-03-10 NOTE — ED Provider Notes (Signed)
[facility] EMERGENCY DEPARTMENT Provider Note   CSN: [id number] Arrival date & time: 03/10/21  1222     History  Chief Complaint  Patient presents with   Gait Problem    James Henderson is a 71 y.o. male.  HPI This is a 71 year old male with a past medical history significant for CKD stage III, diabetes type 2, hyperlipidemia, hypertension, posterior fossa meningioma who presents to emergency department for unsteady gait.  Patient is accompanied by his wife who reports the majority history.  She states that the patient normally walks with a cane.  His last known normal was last night at 9 PM.  She states at that time he was having difficulty ambulating but she did not think anything of it and put the patient to bed.  When she woke up this morning he was unable to stand and take more than 2 steps without falling backwards.  She denies that the patient has fallen or hit his head.  Reportedly the patient has not missed any of his medication doses.  She denies that he has had infectious symptoms such as fever, chills, cough, vomiting, diarrhea, or new rashes.  Of note the patient did have COVID booster yesterday.    Home Medications Prior to Admission medications   Medication Sig Start Date End Date Taking? Authorizing Provider  acetaminophen (TYLENOL) 500 MG tablet Take 1,000 mg by mouth every 6 (six) hours as needed for headache (pain).   Yes [provider]  amLODipine (NORVASC) 5 MG tablet Take 5 mg by mouth daily. 02/01/20  Yes [provider]  Brinzolamide-Brimonidine 1-0.2 % SUSP Place 1 drop into both eyes 2 (two) times daily.    Yes [provider]  Cholecalciferol (VITAMIN D-3) 125 MCG (5000 UT) TABS Take 5,000 Units by mouth daily.   Yes [provider]  clopidogrel (PLAVIX) 75 MG tablet Take 1 tablet (75 mg total) by mouth daily. 10/25/17  Yes Purohit, Konrad Dolores, MD  donepezil (ARICEPT) 10 MG tablet TAKE ONE TABLET BY  MOUTH EVERYDAY AT BEDTIME Patient taking differently: Take 10 mg by mouth at bedtime. 01/11/21  Yes Tomi Likens, Adam R, DO  ezetimibe (ZETIA) 10 MG tablet Take 10 mg by mouth daily. 01/26/20  Yes [provider]  glipiZIDE (GLUCOTROL) 5 MG tablet Take 5 mg by mouth daily.   Yes [provider]  Inositol Niacinate 500 MG CAPS Take 500 mg by mouth every evening.   Yes [provider]  losartan (COZAAR) 100 MG tablet Take 100 mg by mouth daily. 08/16/17  Yes [provider]  metFORMIN (GLUCOPHAGE) 1000 MG tablet Take 500-1,000 mg by mouth See admin instructions. Take1000 mg in the morning and 500 mg in the evening 08/16/17  Yes [provider]  Omega-3 Fatty Acids (FISH OIL) 500 MG CAPS Take 500 mg by mouth every evening.   Yes [provider]  rosuvastatin (CRESTOR) 40 MG tablet Take 1 tablet (40 mg total) by mouth daily at 6 PM. 10/24/17  Yes Purohit, Shrey C, MD  tolterodine (DETROL LA) 4 MG 24 hr capsule Take 4 mg by mouth daily. 03/30/20  Yes [provider]  Vibegron (GEMTESA) 75 MG TABS Take 75 mg by mouth daily.   Yes [provider]      Allergies    Patient has no known allergies.    Review of Systems   Review of Systems  All other systems reviewed and are negative.  Physical Exam Updated Vital  Signs BP (!) 168/98 (BP Location: Right Arm)    Pulse 67    Temp 99.1 F (37.3 C) (Oral)    Resp 16    SpO2 100%  Physical Exam Vitals and nursing note reviewed.  Constitutional:      General: He is not in acute distress.    Appearance: He is well-developed.  HENT:     Head: Normocephalic and atraumatic.  Eyes:     Conjunctiva/sclera: Conjunctivae normal.  Cardiovascular:     Rate and Rhythm: Normal rate and regular rhythm.     Heart sounds: No murmur heard. Pulmonary:     Effort: Pulmonary effort is normal. No respiratory distress.     Breath sounds: Normal breath sounds.  Abdominal:     Palpations: Abdomen is soft.      Tenderness: There is no abdominal tenderness.  Musculoskeletal:        General: No swelling.     Cervical back: Neck supple.  Skin:    General: Skin is warm and dry.     Capillary Refill: Capillary refill takes less than 2 seconds.  Neurological:     Mental Status: He is alert.     Cranial Nerves: No cranial nerve deficit.     Sensory: No sensory deficit.     Motor: No weakness.     Coordination: Coordination abnormal.     Gait: Gait abnormal.     Comments: Ataxic gait.  Difficulty performing finger-to-nose testing with left upper extremity. Vertical nystagmus in the left eye with the right eye is covered.   Psychiatric:        Mood and Affect: Mood normal.    ED Results / Procedures / Treatments   Labs (all labs ordered are listed, but only abnormal results are displayed) Labs Reviewed  CBC WITH DIFFERENTIAL/PLATELET - Abnormal; Notable for the following components:      Result Value   RBC 3.80 (*)    Hemoglobin 11.8 (*)    HCT 36.3 (*)    All other components within normal limits  COMPREHENSIVE METABOLIC PANEL - Abnormal; Notable for the following components:   Glucose, Bld 146 (*)    Creatinine, Ser 1.36 (*)    GFR, Estimated 55 (*)    All other components within normal limits  RESP PANEL BY RT-PCR (FLU A&B, COVID) ARPGX2  PROTIME-INR  APTT  URINALYSIS, ROUTINE W REFLEX MICROSCOPIC  HEMOGLOBIN A1C  LIPID PANEL  CBG MONITORING, ED    EKG None  Radiology CT Head Wo Contrast  Result Date: 03/10/2021 CLINICAL DATA:  Neuro deficit, acute, stroke suspected. Weakness. Gait problems. EXAM: CT HEAD WITHOUT CONTRAST TECHNIQUE: Contiguous axial images were obtained from the base of the skull through the vertex without intravenous contrast. RADIATION DOSE REDUCTION: This exam was performed according to the departmental dose-optimization program which includes automated exposure control, adjustment of the mA and/or kV according to patient size and/or use of iterative  reconstruction technique. COMPARISON:  Brain MRI 09/24/2020. MRA head 09/24/2020. Head CT 09/24/2020. FINDINGS: Brain: Moderate generalized cerebral atrophy. Comparatively mild cerebellar atrophy. 11 mm partially calcified meningioma along the undersurface of the left tentorium, unchanged in size as compared to the head CT of 09/24/2020 (for instance as seen on series 3, image 13) (series 5, image 52). As before, there is contact upon the superior aspect of the left cerebellar hemisphere without significant mass effect. Advanced patchy and confluent hypoattenuation within the cerebral white matter, nonspecific but compatible with chronic small vessel ischemic disease. Redemonstrated  chronic lacunar infarcts within the bilateral thalami. Partially empty sella turcica. There is no acute intracranial hemorrhage. No acute  demarcated cortical infarct. No extra-axial fluid collection. No midline shift. Vascular: No hyperdense vessel.  Atherosclerotic calcifications. Skull: Normal. Negative for fracture or focal lesion. Sinuses/Orbits: Visualized orbits show no acute finding. Trace scattered paranasal sinus mucosal thickening at the imaged levels. IMPRESSION: No evidence of acute intracranial abnormality. Stable non-contrast CT appearance of the brain as compared to 09/24/2020. 11 mm partially calcified meningioma along the undersurface of the left tentorium. Advanced chronic small vessel ischemic changes within the cerebral white matter. Chronic lacunar infarcts within the bilateral thalami. Moderate generalized cerebral atrophy. Comparatively mild cerebellar atrophy. Electronically Signed   By: Kellie Simmering D.O.   On: 03/10/2021 13:47   MR Brain W and Wo Contrast  Result Date: 03/10/2021 CLINICAL DATA:  Dizziness, cardiac or vascular cause suspected, concern for stroke EXAM: MRI HEAD WITHOUT AND WITH CONTRAST TECHNIQUE: Multiplanar, multiecho pulse sequences of the brain and surrounding structures were obtained  without and with intravenous contrast. CONTRAST:  7.76mL GADAVIST GADOBUTROL 1 MMOL/ML IV SOLN COMPARISON:  09/24/2020 MRI head, correlation is also made with 03/10/2021 CT head. FINDINGS: Brain: Two small foci of mildly increased signal on diffusion-weighted imaging with possible ADC correlates in the left posterior frontal lobe (series 2, image 40 and series 250, image 39; series 2, image 38 and series 250, image 38), which may represent very small subacute infarcts versus T2 shine through. No acute hemorrhage, mass, mass effect, or midline shift. No abnormal parenchymal enhancement. Confluent T2 hyperintense signal in the periventricular white matter, likely the sequela of severe lacunar infarcts in the bilateral cerebellar hemispheres and corona radiata chronic small vessel ischemic disease. Redemonstrated diffuse foci of hemosiderin deposition throughout the bilateral cerebral and cerebellar hemispheres. Generalized cerebral volume loss, without lobar predominance. Degree of atrophy is advanced for age. No hydrocephalus or extra-axial collection. Redemonstrated posterior fossa meningioma along the inferior surface of the tentorium, which measures up to 11 mm (series 10, image 17), unchanged. No evidence of significant mass effect on the underlying cerebellum. Vascular: Normal flow voids. Skull and upper cervical spine: Normal marrow signal. Sinuses/Orbits: Negative.  Status post bilateral lens replacements. Other: None. IMPRESSION: 1. Two small foci of abnormal signal in the left posterior frontal lobe on diffusion-weighted imaging with possible ADC correlates, which may represent small subacute infarcts versus T2 shine through. 2. Redemonstrated diffuse cerebral and cerebellar microhemorrhages, in a distribution concerning for cerebral amyloid angiopathy. 3. Unchanged small left posterior fossa meningioma. Electronically Signed   By: Merilyn Baba M.D.   On: 03/10/2021 19:53    Procedures Procedures     Medications Ordered in ED Medications  acetaminophen (TYLENOL) tablet 650 mg (has no administration in time range)    Or  acetaminophen (TYLENOL) 160 MG/5ML solution 650 mg (has no administration in time range)    Or  acetaminophen (TYLENOL) suppository 650 mg (has no administration in time range)  enoxaparin (LOVENOX) injection 40 mg (has no administration in time range)  senna-docusate (Senokot-S) tablet 1 tablet (has no administration in time range)  insulin aspart (novoLOG) injection 0-9 Units (0 Units Subcutaneous Not Given 03/10/21 1726)  brinzolamide (AZOPT) 1 % ophthalmic suspension 1 drop (1 drop Both Eyes Given 03/10/21 1722)    And  brimonidine (ALPHAGAN) 0.2 % ophthalmic solution 1 drop (1 drop Both Eyes Given 03/10/21 1722)  donepezil (ARICEPT) tablet 10 mg (has no administration in time range)  ezetimibe (ZETIA) tablet  10 mg (has no administration in time range)  rosuvastatin (CRESTOR) tablet 40 mg (40 mg Oral Given 03/10/21 1720)  fesoterodine (TOVIAZ) tablet 4 mg (has no administration in time range)  cholecalciferol (VITAMIN D3) tablet 5,000 Units (5,000 Units Oral Given 03/10/21 1720)  clopidogrel (PLAVIX) tablet 75 mg (has no administration in time range)   stroke: mapping our early stages of recovery book ( Does not apply Given 03/10/21 1722)  gadobutrol (GADAVIST) 1 MMOL/ML injection 7.4 mL (7.4 mLs Intravenous Contrast Given 03/10/21 1900)    ED Course/ Medical Decision Making/ A&P                           Medical Decision Making Amount and/or Complexity of Data Reviewed Independent Historian: spouse External Data Reviewed: labs and notes. Labs: ordered. Radiology: ordered and independent interpretation performed. ECG/medicine tests: ordered.  Risk Prescription drug management. Decision regarding hospitalization.   This is a 71 year old male presents to emergency department for evaluation of difficulty ambulating.  Differential diagnosis includes but is  noted to the following: Cerebral edema from patient's known brain tumor, ischemic insult, electrolyte abnormality, hemorrhagic stroke.  Based off of my physical exam I have acute concern for posterior stroke.  Will obtain CT imaging and lab work-up.  CT imaging reviewed independently does revealed patient's known meningioma.  Without acute cranial abnormality.  Lab work-up was overall unremarkable.  Patient's glucose within normal limits no leukocytosis no severe electrode abnormalities.  Neuro hospitalist was spoken to who agrees with obtaining MRI with and without.  Medicine hospitalist team was consulted for admission and agreed to admit the patient to their service for further management of his ataxic gait and a concern for ischemic insult.  Will admit the patient to their service for further management.  Please see their note for further details. Final Clinical Impression(s) / ED Diagnoses Final diagnoses:  None    Rx / DC Orders ED Discharge Orders     None         Zachery Dakins, MD 03/10/21 2142    Sherwood Gambler, MD 03/13/21 [phone]

## 2021-03-10 NOTE — H&P (Signed)
History and Physical    James Henderson SNK:[id number] DOB: 1950-05-11 DOA: 03/10/2021  PCP: James Huxley, PA   Patient coming from: home Chief Complaint  Patient presents with   Gait Problem  HPI: James Henderson is a 71 y.o. male with medical history significant for history of previous history of CKD stage IIIa baseline creatinine 1.3-1.6, diabetes mellitus hyperlipidemia hypertension, small left posterior fossa meningioma to the ED for evaluation of unsteady gait.  He normally walks with a cane but after waking up today could not get his balance right, last known normal 9 PM last night.  ED Course: Afebrile vital stable, labs stable CBC BMP with CKD.  CT head no acute finding but 11 mm meningioma on the left tentorium. He was found to have nystagmus in ED and unsteady gait,Neurology was consulted MRI ordered and patient admitted for further work-up due to unsteady gait unable to ambulate. His wife at bedside in ED. He is able to move his LE well. They both had COVID booster pfizer  yesterday   Assessment/Plan Unsteady gait: We will admit the patient rule out acute stroke CT brain no acute finding. BP goal : Permissive HTN upto 220/110 mmHg  MRI Brain: Pending Obtain-Echocardiogram Prophylactic therapy-patient on Plavix already PTA. Check HgbA1c, fasting lipid panel PT consult, OT consult, Speech consult Telemetry monitoring,Frequent neuro checks Stroke swallow screen  Await further neurology input  Essential hypertension: BP controlled allow permissive hypertension.hold home meds DM: Check hemoglobin A1c, add sliding scale insulin HLD: Checking fasting lipid profile  History of CVA,62yrs ago with left hemiparesis.Refer to #1  CKD IIIa: Renal function is stable at baseline.    History of posterior fossa meningioma- fu MRI  There is no height or weight on file to calculate BMI.   Severity of Illness: Admitted as observation anticipate hospital stay less than 2 midnight DVT  prophylaxis: enoxaparin (LOVENOX) injection 40 mg Start: 03/11/21 0000 Code Status:   Code Status: Full Code  Family Communication: Admission, patients condition and plan of care including tests being ordered have been discussed with the patient and hie wife who indicate understanding and agree with the plan and Code Status.  Consults called:  Neurology by ED  Review of Systems: All systems were reviewed and were negative except as mentioned in HPI above. Negative for fever Negative for chest pain Negative for shortness of breath  Past Medical History:  Diagnosis Date   Brainstem stroke ([place]) 2015   Cerebral meningioma ([place])    Diabetes mellitus without complication ([place])    Glaucoma    Hyperlipidemia    Hypertension     Past Surgical History:  Procedure Laterality Date   BIOPSY  02/15/2020   Procedure: BIOPSY;  Surgeon: Ronnette Juniper, MD;  Location: [facility];  Service: Gastroenterology;;   COLONOSCOPY WITH PROPOFOL N/A 02/15/2020   Procedure: COLONOSCOPY WITH PROPOFOL;  Surgeon: Ronnette Juniper, MD;  Location: [facility];  Service: Gastroenterology;  Laterality: N/A;   EYE SURGERY     POLYPECTOMY  02/15/2020   Procedure: POLYPECTOMY;  Surgeon: Ronnette Juniper, MD;  Location: [facility];  Service: Gastroenterology;;     reports that he quit smoking about 23 years ago. He has never used smokeless tobacco. He reports that he does not drink alcohol and does not use drugs.  No Known Allergies  Family History  Problem Relation Age of Onset   Stroke Mother    Stroke Brother    Diabetes type II Brother      Prior to  Admission medications   Medication Sig Start Date End Date Taking? Authorizing Provider  acetaminophen (TYLENOL) 500 MG tablet Take 1,000 mg by mouth every 6 (six) hours as needed for headache (pain).    [provider]  amLODipine (NORVASC) 5 MG tablet Take 5 mg by mouth daily. 02/01/20   [provider]  Brinzolamide-Brimonidine 1-0.2 % SUSP  Place 1 drop into both eyes 2 (two) times daily.     [provider]  Cholecalciferol (VITAMIN D-3) 125 MCG (5000 UT) TABS Take 5,000 Units by mouth daily.    [provider]  clopidogrel (PLAVIX) 75 MG tablet Take 1 tablet (75 mg total) by mouth daily. 10/25/17   Purohit, Konrad Dolores, MD  donepezil (ARICEPT) 10 MG tablet TAKE ONE TABLET BY MOUTH EVERYDAY AT BEDTIME 01/11/21   Tomi Likens, Adam R, DO  ezetimibe (ZETIA) 10 MG tablet Take 10 mg by mouth daily. 01/26/20   [provider]  glipiZIDE (GLUCOTROL) 5 MG tablet Take 5 mg by mouth daily.    [provider]  Inositol Niacinate 500 MG CAPS Take 500 mg by mouth every evening.    [provider]  losartan (COZAAR) 100 MG tablet Take 100 mg by mouth daily. 08/16/17   [provider]  metFORMIN (GLUCOPHAGE) 1000 MG tablet Take 500-1,000 mg by mouth See admin instructions. Take1000 mg in the morning and 500 mg in the evening 08/16/17   [provider]  Omega-3 Fatty Acids (FISH OIL) 500 MG CAPS Take 500 mg by mouth every evening.    [provider]  rosuvastatin (CRESTOR) 40 MG tablet Take 1 tablet (40 mg total) by mouth daily at 6 PM. 10/24/17   Purohit, Konrad Dolores, MD  tolterodine (DETROL LA) 4 MG 24 hr capsule Take 4 mg by mouth daily. 03/30/20   [provider]  Vibegron (GEMTESA) 75 MG TABS Take 75 mg by mouth daily.    [provider]    Physical Exam: Vitals:   03/10/21 1236 03/10/21 1518 03/10/21 1520 03/10/21 1530  BP: (!) 157/82 (!) 169/86  (!) 161/91  Pulse: 69 (!) 57 65 (!) 58  Resp: 16  19 15   Temp: 98.6 F (37 C)     TempSrc: Oral     SpO2: 97% 98% 100% 100%    General exam: AAOX3, PLEASANT,NAD, weak appearing. HEENT:Oral mucosa moist, Ear/Nose WNL grossly, dentition normal. Respiratory system: bilaterally clear,no wheezing or crackles,no use of accessory muscle Cardiovascular system: S1 & S2 +, No JVD,. Gastrointestinal system: Abdomen soft, NT,ND,  BS+ Nervous System:Alert, awake, moving extremities and grossly nonfocal Extremities: No edema, distal peripheral pulses palpable.  Skin: No rashes,no icterus. MSK: Normal muscle bulk,tone, power   Labs on Admission: I have personally reviewed following labs and imaging studies  CBC: Recent Labs  Lab 03/10/21 1327  WBC 4.2  NEUTROABS 3.0  HGB 11.8*  HCT 36.3*  MCV 95.5  PLT 793   Basic Metabolic Panel: Recent Labs  Lab 03/10/21 1327  NA 140  K 3.8  CL 110  CO2 23  GLUCOSE 146*  BUN 12  CREATININE 1.36*  CALCIUM 8.9   GFR: CrCl cannot be calculated (Unknown ideal weight.). Liver Function Tests: Recent Labs  Lab 03/10/21 1327  AST 18  ALT 14  ALKPHOS 106  BILITOT 0.9  PROT 6.5  ALBUMIN 3.6   No results for input(s): LIPASE, AMYLASE in the last 168 hours. No results for input(s): AMMONIA in the last 168 hours. Coagulation Profile: No  results for input(s): INR, PROTIME in the last 168 hours. Cardiac Enzymes: No results for input(s): CKTOTAL, CKMB, CKMBINDEX, TROPONINI in the last 168 hours. BNP (last 3 results) No results for input(s): PROBNP in the last 8760 hours. HbA1C: No results for input(s): HGBA1C in the last 72 hours. CBG: No results for input(s): GLUCAP in the last 168 hours. Lipid Profile: No results for input(s): CHOL, HDL, LDLCALC, TRIG, CHOLHDL, LDLDIRECT in the last 72 hours. Thyroid Function Tests: No results for input(s): TSH, T4TOTAL, FREET4, T3FREE, THYROIDAB in the last 72 hours. Anemia Panel: No results for input(s): VITAMINB12, FOLATE, FERRITIN, TIBC, IRON, RETICCTPCT in the last 72 hours. Urine analysis:    Component Value Date/Time   COLORURINE YELLOW 09/24/2020 2315   APPEARANCEUR CLEAR 09/24/2020 2315   LABSPEC 1.017 09/24/2020 2315   PHURINE 5.0 09/24/2020 2315   GLUCOSEU NEGATIVE 09/24/2020 2315   HGBUR NEGATIVE 09/24/2020 2315   BILIRUBINUR NEGATIVE 09/24/2020 2315   KETONESUR NEGATIVE 09/24/2020 2315   PROTEINUR 30 (A)  09/24/2020 2315   UROBILINOGEN 0.2 12/07/2013 0032   NITRITE NEGATIVE 09/24/2020 2315   LEUKOCYTESUR NEGATIVE 09/24/2020 2315    Radiological Exams on Admission: CT Head Wo Contrast  Result Date: 03/10/2021 CLINICAL DATA:  Neuro deficit, acute, stroke suspected. Weakness. Gait problems. EXAM: CT HEAD WITHOUT CONTRAST TECHNIQUE: Contiguous axial images were obtained from the base of the skull through the vertex without intravenous contrast. RADIATION DOSE REDUCTION: This exam was performed according to the departmental dose-optimization program which includes automated exposure control, adjustment of the mA and/or kV according to patient size and/or use of iterative reconstruction technique. COMPARISON:  Brain MRI 09/24/2020. MRA head 09/24/2020. Head CT 09/24/2020. FINDINGS: Brain: Moderate generalized cerebral atrophy. Comparatively mild cerebellar atrophy. 11 mm partially calcified meningioma along the undersurface of the left tentorium, unchanged in size as compared to the head CT of 09/24/2020 (for instance as seen on series 3, image 13) (series 5, image 52). As before, there is contact upon the superior aspect of the left cerebellar hemisphere without significant mass effect. Advanced patchy and confluent hypoattenuation within the cerebral white matter, nonspecific but compatible with chronic small vessel ischemic disease. Redemonstrated chronic lacunar infarcts within the bilateral thalami. Partially empty sella turcica. There is no acute intracranial hemorrhage. No acute  demarcated cortical infarct. No extra-axial fluid collection. No midline shift. Vascular: No hyperdense vessel.  Atherosclerotic calcifications. Skull: Normal. Negative for fracture or focal lesion. Sinuses/Orbits: Visualized orbits show no acute finding. Trace scattered paranasal sinus mucosal thickening at the imaged levels. IMPRESSION: No evidence of acute intracranial abnormality. Stable non-contrast CT appearance of the brain  as compared to 09/24/2020. 11 mm partially calcified meningioma along the undersurface of the left tentorium. Advanced chronic small vessel ischemic changes within the cerebral white matter. Chronic lacunar infarcts within the bilateral thalami. Moderate generalized cerebral atrophy. Comparatively mild cerebellar atrophy. Electronically Signed   By: Kellie Simmering D.O.   On: 03/10/2021 13:47      Antonieta Pert MD [facility]  If 7PM-7AM, please contact night-coverage [email or web address]  03/10/2021, 4:37 PM

## 2021-03-10 NOTE — ED Notes (Signed)
Pt provided with urinal

## 2021-03-10 NOTE — Progress Notes (Incomplete)
[id number]  °

## 2021-03-10 NOTE — ED Provider Triage Note (Signed)
Emergency Medicine Provider Triage Evaluation Note  James Henderson , a 71 y.o. male  was evaluated in triage.  Pt complains of weakness and difficulty walking.  Pt reports normal until last night  Review of Systems  Positive: Right leg weak Negative: Cough or fever  Physical Exam  BP (!) 157/82 (BP Location: Left Arm)    Pulse 69    Temp 98.6 F (37 C) (Oral)    Resp 16    SpO2 97%  Gen:   Awake, no distress   Resp:  Normal effort  MSK:   Moves extremities without difficulty  Other:    Medical Decision Making  Medically screening exam initiated at 1:23 PM.  Appropriate orders placed.  James Henderson was informed that the remainder of the evaluation will be completed by another provider, this initial triage assessment does not replace that evaluation, and the importance of remaining in the ED until their evaluation is complete.  I am concerned for cva, more than 12 hours ago   Fransico Meadow, [place] 03/10/21 1325

## 2021-03-10 NOTE — ED Notes (Signed)
Dinner tray warmed up and set up for pt.

## 2021-03-10 NOTE — ED Notes (Signed)
Pt returned from MRI °

## 2021-03-10 NOTE — ED Notes (Signed)
Meal tray order for pt

## 2021-03-11 ENCOUNTER — Observation Stay (HOSPITAL_COMMUNITY): Payer: HMO

## 2021-03-11 ENCOUNTER — Observation Stay (HOSPITAL_BASED_OUTPATIENT_CLINIC_OR_DEPARTMENT_OTHER): Payer: HMO

## 2021-03-11 DIAGNOSIS — I10 Essential (primary) hypertension: Secondary | ICD-10-CM

## 2021-03-11 DIAGNOSIS — I6389 Other cerebral infarction: Secondary | ICD-10-CM | POA: Diagnosis not present

## 2021-03-11 DIAGNOSIS — I6622 Occlusion and stenosis of left posterior cerebral artery: Secondary | ICD-10-CM | POA: Diagnosis not present

## 2021-03-11 DIAGNOSIS — N1831 Chronic kidney disease, stage 3a: Secondary | ICD-10-CM

## 2021-03-11 DIAGNOSIS — E1140 Type 2 diabetes mellitus with diabetic neuropathy, unspecified: Secondary | ICD-10-CM

## 2021-03-11 DIAGNOSIS — I63312 Cerebral infarction due to thrombosis of left middle cerebral artery: Secondary | ICD-10-CM | POA: Diagnosis not present

## 2021-03-11 DIAGNOSIS — Z8673 Personal history of transient ischemic attack (TIA), and cerebral infarction without residual deficits: Secondary | ICD-10-CM

## 2021-03-11 DIAGNOSIS — E785 Hyperlipidemia, unspecified: Secondary | ICD-10-CM | POA: Diagnosis not present

## 2021-03-11 DIAGNOSIS — I639 Cerebral infarction, unspecified: Secondary | ICD-10-CM | POA: Diagnosis not present

## 2021-03-11 DIAGNOSIS — R2681 Unsteadiness on feet: Secondary | ICD-10-CM | POA: Diagnosis not present

## 2021-03-11 LAB — ECHOCARDIOGRAM COMPLETE
AR max vel: 2.11 cm2
AV Area VTI: 2.5 cm2
AV Area mean vel: 2.07 cm2
AV Mean grad: 7 mmHg
AV Peak grad: 12.3 mmHg
Ao pk vel: 1.75 m/s
Area-P 1/2: 1.97 cm2
Calc EF: 68.5 %
Height: 66 in
S' Lateral: 2.3 cm
Single Plane A2C EF: 74.1 %
Single Plane A4C EF: 63.6 %
Weight: 2659.63 oz

## 2021-03-11 LAB — HEMOGLOBIN A1C
Hgb A1c MFr Bld: 6.4 % — ABNORMAL HIGH (ref 4.8–5.6)
Mean Plasma Glucose: 137 mg/dL

## 2021-03-11 LAB — GLUCOSE, CAPILLARY
Glucose-Capillary: 117 mg/dL — ABNORMAL HIGH (ref 70–99)
Glucose-Capillary: 196 mg/dL — ABNORMAL HIGH (ref 70–99)
Glucose-Capillary: 91 mg/dL (ref 70–99)
Glucose-Capillary: 175 mg/dL — ABNORMAL HIGH (ref 70–99)

## 2021-03-11 LAB — LIPID PANEL
Cholesterol: 95 mg/dL (ref 0–200)
HDL: 37 mg/dL — ABNORMAL LOW (ref >40)
LDL Cholesterol: 45 mg/dL (ref 0–99)
Total CHOL/HDL Ratio: 2.6 RATIO
Triglycerides: 64 mg/dL (ref <150)
VLDL: 13 mg/dL (ref 0–40)

## 2021-03-11 MED ORDER — AMLODIPINE BESYLATE 5 MG PO TABS
5.0000 mg | ORAL_TABLET | Freq: Every day | ORAL | Status: DC
  Administered 2021-03-11: 5 mg via ORAL
  Filled 2021-03-11: qty 1

## 2021-03-11 MED ORDER — IOHEXOL 350 MG/ML SOLN
75.0000 mL | Freq: Once | INTRAVENOUS | Status: AC | PRN
  Administered 2021-03-11: 75 mL via INTRAVENOUS

## 2021-03-11 MED ORDER — LOSARTAN POTASSIUM 50 MG PO TABS
100.0000 mg | ORAL_TABLET | Freq: Every day | ORAL | Status: DC
  Administered 2021-03-11 – 2021-03-12 (×2): 100 mg via ORAL
  Filled 2021-03-11 (×2): qty 2

## 2021-03-11 MED ORDER — ASPIRIN EC 81 MG PO TBEC
81.0000 mg | DELAYED_RELEASE_TABLET | Freq: Every day | ORAL | Status: DC
  Administered 2021-03-11 – 2021-03-12 (×2): 81 mg via ORAL
  Filled 2021-03-11 (×2): qty 1

## 2021-03-11 NOTE — Plan of Care (Signed)
°  Problem: Education: Goal: Knowledge of disease or condition will improve Outcome: Progressing   Problem: Coping: Goal: Will verbalize positive feelings about self Outcome: Progressing Goal: Will identify appropriate support needs Outcome: Progressing   Problem: Self-Care: Goal: Ability to participate in self-care as condition permits will improve Outcome: Progressing Goal: Verbalization of feelings and concerns over difficulty with self-care will improve Outcome: Progressing Goal: Ability to communicate needs accurately will improve Outcome: Progressing

## 2021-03-11 NOTE — Evaluation (Signed)
Physical Therapy Evaluation Patient Details Name: James Henderson MRN: [id number] DOB: [birth year] Today's Date: 03/11/2021  History of Present Illness  71 y/o male presented to ED on 03/10/21 with gait unsteadiness. MRI suggestive of L parietal infarct. CT head with no acute finding but 11 mm meningioma on L tentorium. PMH: dementia, brainstem stroke, meningioma, non-insulin-dependent type II diabetes, glaucoma, hypertension, hyperlipidemia, CKD stage II-IIIa  Clinical Impression  Patient admitted with above diagnosis. Patient presents with generalized weakness, impaired balance, decreased activity tolerance, and impaired cognition. Patient requires assistance with mobility with cane at baseline per wife. Patient ambulatory with RW and minA for balance. Wife reports able to provide necessary assistance at home at discharge. Patient will benefit from skilled PT services during acute stay to address listed deficits. Recommend HHPT at discharge to address balance deficits and maximize functional mobility.        Recommendations for follow up therapy are one component of a multi-disciplinary discharge planning process, led by the attending physician.  Recommendations may be updated based on patient status, additional functional criteria and insurance authorization.  Follow Up Recommendations Home health PT    Assistance Recommended at Discharge Frequent or constant Supervision/Assistance  Patient can return home with the following  A little help with walking and/or transfers;A little help with bathing/dressing/bathroom;Direct supervision/assist for financial management;Direct supervision/assist for medications management;Assistance with cooking/housework;Assist for transportation    Equipment Recommendations Rolling Anna-Marie Coller (2 wheels)  Recommendations for Other Services       Functional Status Assessment Patient has had a recent decline in their functional status and demonstrates the ability to make  significant improvements in function in a reasonable and predictable amount of time.     Precautions / Restrictions Precautions Precautions: Fall Restrictions Weight Bearing Restrictions: No      Mobility  Bed Mobility Overal bed mobility: Needs Assistance Bed Mobility: Supine to Sit     Supine to sit: Min assist     General bed mobility comments: minA for repositioning hips towards EOB. Posterior lean upon sitting    Transfers Overall transfer level: Needs assistance Equipment used: Rolling Carvell Hoeffner (2 wheels) Transfers: Sit to/from Stand Sit to Stand: Min assist           General transfer comment: Cues for hand placement. Assist for boost up into standing and steadying    Ambulation/Gait Ambulation/Gait assistance: Min assist Gait Distance (Feet): 25 Feet Assistive device: Rolling Lanice Folden (2 wheels) Gait Pattern/deviations: Step-to pattern, Decreased stride length, Decreased step length - left, Decreased step length - right, Trunk flexed, Narrow base of support Gait velocity: decreased     General Gait Details: minA for balance with slow gait speed. Posterior LOB x1 with turning. Short steps with decreased step length bilaterally but R>L.  Stairs            Wheelchair Mobility    Modified Rankin (Stroke Patients Only) Modified Rankin (Stroke Patients Only) Pre-Morbid Rankin Score: Moderately severe disability Modified Rankin: Moderately severe disability     Balance Overall balance assessment: Needs assistance Sitting-balance support: Single extremity supported, Feet supported Sitting balance-Leahy Scale: Poor Sitting balance - Comments: posterior lean with addition of tasks (MMT, coordination, etc.) Postural control: Posterior lean Standing balance support: Bilateral upper extremity supported, During functional activity, Reliant on assistive device for balance Standing balance-Leahy Scale: Poor Standing balance comment: minA for balance and reliant  on UE support  Pertinent Vitals/Pain Pain Assessment Pain Assessment: No/denies pain    Home Living Family/patient expects to be discharged to:: Private residence Living Arrangements: Spouse/significant other Available Help at Discharge: Family;Available 24 hours/day Type of Home: House Home Access: Level entry       Home Layout: One level Home Equipment: Cane - single point;Rollator (4 wheels) Additional Comments: information obtained from wife via phone call    Prior Function Prior Level of Function : Needs assist  Cognitive Assist : Mobility (cognitive);ADLs (cognitive) Mobility (Cognitive): Intermittent cues ADLs (Cognitive): Intermittent cues Physical Assist : Mobility (physical);ADLs (physical) Mobility (physical): Gait ADLs (physical): Dressing Mobility Comments: uses cane for mobility and wife states someone is with him while he is mobile for safety due to balance issues. Reports 3-4 falls in past 6 weeks. ADLs Comments: requires assistance for dressing at times. Requires assist for financial and medication management. Wife states patient will leave money laying around house so she manages money     Hand Dominance        Extremity/Trunk Assessment   Upper Extremity Assessment Upper Extremity Assessment: Defer to OT evaluation    Lower Extremity Assessment Lower Extremity Assessment: Generalized weakness (R weaker than L)    Cervical / Trunk Assessment Cervical / Trunk Assessment: Kyphotic  Communication   Communication: Expressive difficulties;Other (comment) (slow to answer)  Cognition Arousal/Alertness: Awake/alert Behavior During Therapy: WFL for tasks assessed/performed Overall Cognitive Status: History of cognitive impairments - at baseline                                 General Comments: Per wife, patient is forgetful and requires assistance for medication and financial management         General Comments      Exercises     Assessment/Plan    PT Assessment Patient needs continued PT services  PT Problem List Decreased strength;Decreased activity tolerance;Decreased balance;Decreased mobility;Decreased coordination;Decreased cognition;Decreased knowledge of use of DME;Decreased knowledge of precautions;Decreased safety awareness       PT Treatment Interventions DME instruction;Gait training;Therapeutic activities;Functional mobility training;Therapeutic exercise;Balance training;Neuromuscular re-education;Patient/family education    PT Goals (Current goals can be found in the Care Plan section)  Acute Rehab PT Goals Patient Stated Goal: did not state PT Goal Formulation: With family Time For Goal Achievement: 03/25/21 Potential to Achieve Goals: Fair    Frequency Min 3X/week     Co-evaluation               AM-PAC PT "6 Clicks" Mobility  Outcome Measure Help needed turning from your back to your side while in a flat bed without using bedrails?: A Little Help needed moving from lying on your back to sitting on the side of a flat bed without using bedrails?: A Little Help needed moving to and from a bed to a chair (including a wheelchair)?: A Little Help needed standing up from a chair using your arms (e.g., wheelchair or bedside chair)?: A Little Help needed to walk in hospital room?: A Little Help needed climbing 3-5 steps with a railing? : A Little 6 Click Score: 18    End of Session Equipment Utilized During Treatment: Gait belt Activity Tolerance: Patient tolerated treatment well Patient left: in chair;with call bell/phone within reach;with chair alarm set;with nursing/sitter in room Nurse Communication: Mobility status PT Visit Diagnosis: Unsteadiness on feet (R26.81);Muscle weakness (generalized) (M62.81);History of falling (Z91.81);Difficulty in walking, not elsewhere classified (R26.2)    Time:  3818-2993 PT Time Calculation (min) (ACUTE  ONLY): 21 min   Charges:   PT Evaluation $PT Eval Moderate Complexity: 1 Mod          Tonianne Fine A. Gilford Rile PT, DPT Acute Rehabilitation Services Pager [phone] Office [phone]   Linna Hoff 03/11/2021, 10:17 AM

## 2021-03-11 NOTE — Evaluation (Signed)
Speech Language Pathology Evaluation Patient Details Name: James Henderson MRN: [id number] DOB: [birth year] Today's Date: 03/11/2021 Time: 2440-1027 SLP Time Calculation (min) (ACUTE ONLY): 24 min  Problem List:  Patient Active Problem List   Diagnosis Date Noted   Unsteady gait 03/10/2021   Left-sided weakness 09/25/2020   AKI (acute kidney injury) ([place]) 09/24/2020   Diabetes mellitus type 2 in nonobese (HCC)    Benign essential HTN    History of CVA (cerebrovascular accident)    Acute blood loss anemia    Stage 3 chronic kidney disease ([place])    Acute CVA (cerebrovascular accident) ([place]) 10/21/2017   HLD (hyperlipidemia) 01/10/2014   Acute brainstem infarction ([place]) 12/07/2013   Dizziness 12/06/2013   DM (diabetes mellitus) type II controlled, neurological manifestation ([place]) 12/06/2013   HYPERLIPIDEMIA 02/26/2006   GLAUCOMA NOS 02/26/2006   Essential hypertension 02/26/2006   Past Medical History:  Past Medical History:  Diagnosis Date   Brainstem stroke ([place]) 2015   Cerebral meningioma ([place])    Diabetes mellitus without complication ([place])    Glaucoma    Hyperlipidemia    Hypertension    Past Surgical History:  Past Surgical History:  Procedure Laterality Date   BIOPSY  02/15/2020   Procedure: BIOPSY;  Surgeon: Ronnette Juniper, MD;  Location: [facility];  Service: Gastroenterology;;   COLONOSCOPY WITH PROPOFOL N/A 02/15/2020   Procedure: COLONOSCOPY WITH PROPOFOL;  Surgeon: Ronnette Juniper, MD;  Location: [facility];  Service: Gastroenterology;  Laterality: N/A;   EYE SURGERY     POLYPECTOMY  02/15/2020   Procedure: POLYPECTOMY;  Surgeon: Ronnette Juniper, MD;  Location: [facility];  Service: Gastroenterology;;   HPI:  71 y/o male presented to ED on 03/10/21 with gait unsteadiness. MRI suggestive of L parietal infarct. CT head with no acute finding but 11 mm meningioma on L tentorium. PMH: dementia, brainstem stroke, meningioma, non-insulin-dependent type II diabetes, glaucoma,  hypertension, hyperlipidemia, CKD stage II-IIIa   Assessment / Plan / Recommendation Clinical Impression  Patient presents with deficits in short term memory and reasoning as well as possible mild word finding deficits although no family present to confirm. Will continue to follow to maximize communication and safetly/efficiency with ADLs as well as for ongoing communication wtih family on what his baseline is as well as what his needs will be after d/c.    SLP Assessment  SLP Recommendation/Assessment: Patient needs continued Speech [facility] SLP Visit Diagnosis: Cognitive communication deficit (R41.841)    Recommendations for follow up therapy are one component of a multi-disciplinary discharge planning process, led by the attending physician.  Recommendations may be updated based on patient status, additional functional criteria and insurance authorization.             Frequency and Duration min 2x/week  1 week      SLP Evaluation Cognition  Overall Cognitive Status: No family/caregiver present to determine baseline cognitive functioning Arousal/Alertness: Awake/alert Orientation Level: Oriented to person;Oriented to place;Oriented to time;Oriented to situation Memory: Impaired Memory Impairment: Storage deficit;Retrieval deficit;Decreased short term memory Awareness:  (TBD) Problem Solving: Impaired Problem Solving Impairment: Verbal basic Executive Function: Reasoning Reasoning: Impaired Reasoning Impairment: Verbal basic       Comprehension  Auditory Comprehension Overall Auditory Comprehension: Appears within functional limits for tasks assessed Visual Recognition/Discrimination Discrimination: Within Function Limits Reading Comprehension Reading Status: Not tested    Expression Expression Primary Mode of Expression: Verbal Verbal Expression Overall Verbal Expression: Impaired Initiation: No impairment Automatic Speech: Name;Social  Response Level of Generative/Spontaneous Verbalization:  Sentence Repetition: No impairment Naming: Impairment Confrontation: Impaired Convergent: 75-100% accurate Written Expression Dominant Hand: Right Written Expression: Not tested   Oral / Motor  Oral Motor/Sensory Function Overall Oral Motor/Sensory Function: Within functional limits Motor Speech Overall Motor Speech: Appears within functional limits for tasks assessed           James Rainwater MA, CCC-SLP  [place] Catlin Meryl 03/11/2021, 10:48 AM

## 2021-03-11 NOTE — Consult Note (Addendum)
Neurology Consultation Reason for Consult: Gait unsteadiness Referring Physician: Kc, R  CC: Gait unsteadiness  History is obtained from: Patient  HPI: James Henderson is a 71 y.o. male with a history of previous stroke who presents with gait unsteadiness that started abruptly last night.  He states that he was in his normal state of health until around 1030 when he got up to go to bed and found that he was unable to go.  He typically can make it on his own but was unable to this time.  He denies any speech changes, states that he has problems from a previous stroke.  Of note, he did get his COVID booster yesterday.  He denies any visual change, or focal numbness or weakness.   LKW: 9 PM 1/19 tpa given?: no, outside of window   ROS: A 14 point ROS was performed and is negative except as noted in the HPI.  Past Medical History:  Diagnosis Date   Brainstem stroke ([place]) 2015   Cerebral meningioma ([place])    Diabetes mellitus without complication ([place])    Glaucoma    Hyperlipidemia    Hypertension      Family History  Problem Relation Age of Onset   Stroke Mother    Stroke Brother    Diabetes type II Brother      Social History:  reports that he quit smoking about 23 years ago. He has never used smokeless tobacco. He reports that he does not drink alcohol and does not use drugs.   Exam: Current vital signs: BP (!) 163/79    Pulse 60    Temp 98.2 F (36.8 C) (Oral)    Resp 14    Ht 5\' 6"  (1.676 m)    Wt 75.4 kg    SpO2 100%    BMI 26.83 kg/m  Vital signs in last 24 hours: Temp:  [98.2 F (36.8 C)-99.1 F (37.3 C)] 98.2 F (36.8 C) (01/20 2329) Pulse Rate:  [57-69] 60 (01/20 2329) Resp:  [13-25] 14 (01/20 2320) BP: (157-175)/(79-102) 163/79 (01/20 2320) SpO2:  [97 %-100 %] 100 % (01/20 2329) Weight:  [75.4 kg] 75.4 kg (01/20 2338)   Physical Exam  Constitutional: Appears well-developed and well-nourished.  Psych: Affect appropriate to situation Eyes: No scleral  injection HENT: No OP obstruction MSK: no joint deformities.  Cardiovascular: Normal rate and regular rhythm.  Respiratory: Effort normal, non-labored breathing GI: Soft.  No distension. There is no tenderness.  Skin: WDI  Neuro: Mental Status: Patient is awake, alert, oriented to person, place, month, year, and situation. He has a slow deliberate speech pattern.  He is able to name simple objects, though often uses the [place] word(after I indicated that was okay) Cranial Nerves: II: Visual Fields are full. Pupils are equal, round, and reactive to light.   III,IV, VI: EOMI without ptosis or diploplia.  He does not have any clear nystagmus but has significant saccadic intrusion into smooth pursuit V: Facial sensation is symmetric to temperature VII: Facial movement is symmetric.  VIII: hearing is intact to voice X: Uvula elevates symmetrically XI: Shoulder shrug is symmetric. XII: tongue is midline without atrophy or fasciculations.  Motor: Tone is normal. Bulk is normal. 5/5 strength was present in all four extremities.  He has mild drift in the right upper extremity Sensory: Sensation is symmetric to light touch and temperature in the arms and legs. Cerebellar: He has some impairment on finger-nose-finger bilaterally     I have reviewed labs in  epic and the results pertinent to this consultation are: Creatinine 1.36  I have reviewed the images obtained: MRI brain-diffusion positivity in left parietal lobe  Impression: 71 year old male with worsening gait dysfunction with MRI suggestive of left parietal infarct.  Given the acute onset, I do suspect that stroke is likely etiology, and therefore suspect that these represent stroke.  One other possibility would be that he has "peeling the onion" effect from his inflammatory state due to his COVID-vaccine, but I favor acute stroke as etiology.  Recommendations: - HgbA1c, fasting lipid panel - Frequent neuro checks -  Echocardiogram - Prophylactic therapy-Antiplatelet med: Aspirin - dose 81mg  and plavix 75mg  daily(already on clopidogrel at home) - Risk factor modification - Telemetry monitoring - PT consult, OT consult, Speech consult - Stroke team to follow    Roland Rack, MD [facility] [phone]  If 7pm- 7am, please page neurology on call as listed in [place].

## 2021-03-11 NOTE — Evaluation (Signed)
Occupational Therapy Evaluation Patient Details Name: James Henderson MRN: [id number] DOB: [birth year] Today's Date: 03/11/2021   History of Present Illness 71 y/o male presented to ED on 03/10/21 with gait unsteadiness. MRI suggestive of L parietal infarct. CT head with no acute finding but 11 mm meningioma on L tentorium. PMH: dementia, brainstem stroke, meningioma, non-insulin-dependent type II diabetes, glaucoma, hypertension, hyperlipidemia, CKD stage II-IIIa   Clinical Impression   Pt admitted for concerns listed above. PTA pt's family reported that he was requiring some supervision with ADL's and mobility due to cognition and balance. At this time, pt presents with decreased balance and weakness, requiring min guard to min assist for all ADL's and functional mobility. PT also needing RW for mobility at this time, where he was using a cane at home. Recommending HHOT to maximize independence and safety as well as reducing caregiver burden at home. OT will continue to follow acutely.      Recommendations for follow up therapy are one component of a multi-disciplinary discharge planning process, led by the attending physician.  Recommendations may be updated based on patient status, additional functional criteria and insurance authorization.   Follow Up Recommendations  Home health OT    Assistance Recommended at Discharge Intermittent Supervision/Assistance  Patient can return home with the following A little help with walking and/or transfers;A little help with bathing/dressing/bathroom;Direct supervision/assist for medications management;Direct supervision/assist for financial management    Functional Status Assessment  Patient has had a recent decline in their functional status and demonstrates the ability to make significant improvements in function in a reasonable and predictable amount of time.  Equipment Recommendations  BSC/3in1    Recommendations for Other Services        Precautions / Restrictions Precautions Precautions: Fall Restrictions Weight Bearing Restrictions: No      Mobility Bed Mobility Overal bed mobility: Needs Assistance Bed Mobility: Supine to Sit     Supine to sit: Min assist     General bed mobility comments: minA for repositioning hips towards EOB. Posterior lean upon sitting    Transfers Overall transfer level: Needs assistance Equipment used: Rolling walker (2 wheels) Transfers: Sit to/from Stand Sit to Stand: Min assist           General transfer comment: Cues for hand placement. Assist for boost up into standing and steadying      Balance Overall balance assessment: Needs assistance Sitting-balance support: Single extremity supported, Feet supported Sitting balance-Leahy Scale: Poor Sitting balance - Comments: posterior lean with dynamic tasks Postural control: Posterior lean Standing balance support: Bilateral upper extremity supported, During functional activity, Reliant on assistive device for balance Standing balance-Leahy Scale: Poor Standing balance comment: minA for balance and reliant on UE support                           ADL either performed or assessed with clinical judgement   ADL Overall ADL's : Needs assistance/impaired Eating/Feeding: Set up;Sitting   Grooming: Set up;Sitting   Upper Body Bathing: Min guard;Sitting   Lower Body Bathing: Minimal assistance;Sitting/lateral leans;Sit to/from stand   Upper Body Dressing : Min guard;Sitting   Lower Body Dressing: Minimal assistance;Sitting/lateral leans;Sit to/from stand   Toilet Transfer: Minimal assistance;Ambulation   Toileting- Clothing Manipulation and Hygiene: Minimal assistance;Sitting/lateral lean;Sit to/from stand       Functional mobility during ADLs: Minimal assistance;Rolling walker (2 wheels) General ADL Comments: Pt needing min guard to min A     Vision Baseline  Vision/History: 1 Wears glasses Ability to  See in Adequate Light: 0 Adequate Patient Visual Report: No change from baseline Vision Assessment?: No apparent visual deficits     Perception     Praxis      Pertinent Vitals/Pain Pain Assessment Pain Assessment: No/denies pain     Hand Dominance Right   Extremity/Trunk Assessment Upper Extremity Assessment Upper Extremity Assessment: Overall WFL for tasks assessed   Lower Extremity Assessment Lower Extremity Assessment: Defer to PT evaluation   Cervical / Trunk Assessment Cervical / Trunk Assessment: Kyphotic   Communication Communication Communication: Expressive difficulties;Other (comment) (slow to answer)   Cognition Arousal/Alertness: Awake/alert Behavior During Therapy: WFL for tasks assessed/performed Overall Cognitive Status: History of cognitive impairments - at baseline                                 General Comments: Per wife, patient is forgetful and requires assistance for medication and financial management     General Comments  VSS on RA, Son's SO in room providing information    Exercises     Shoulder Instructions      Home Living Family/patient expects to be discharged to:: Private residence Living Arrangements: Spouse/significant other Available Help at Discharge: Family;Available 24 hours/day Type of Home: House Home Access: Level entry     Home Layout: One level     Bathroom Shower/Tub: Teacher, early years/pre: Standard     Home Equipment: Cane - single point;Rollator (4 wheels)   Additional Comments: information obtained from wife via phone call      Prior Functioning/Environment Prior Level of Function : Needs assist  Cognitive Assist : Mobility (cognitive);ADLs (cognitive) Mobility (Cognitive): Intermittent cues ADLs (Cognitive): Intermittent cues Physical Assist : Mobility (physical);ADLs (physical) Mobility (physical): Gait ADLs (physical): Dressing Mobility Comments: uses cane for mobility  and wife states someone is with him while he is mobile for safety due to balance issues. Reports 3-4 falls in past 6 weeks. ADLs Comments: requires assistance for dressing at times. Requires assist for financial and medication management. Wife states patient will leave money laying around house so she manages money        OT Problem List: Decreased strength;Decreased range of motion;Decreased activity tolerance;Impaired balance (sitting and/or standing);Decreased safety awareness;Decreased cognition;Decreased knowledge of use of DME or AE      OT Treatment/Interventions: Self-care/ADL training;Therapeutic exercise;Energy conservation;DME and/or AE instruction;Therapeutic activities;Patient/family education;Balance training    OT Goals(Current goals can be found in the care plan section) Acute Rehab OT Goals Patient Stated Goal: To gohome OT Goal Formulation: With patient Time For Goal Achievement: 03/25/21 Potential to Achieve Goals: Good ADL Goals Pt Will Perform Grooming: with supervision;standing Pt Will Perform Lower Body Bathing: with supervision;sitting/lateral leans;sit to/from stand Pt Will Perform Lower Body Dressing: with supervision;sitting/lateral leans;sit to/from stand Pt Will Transfer to Toilet: with supervision;ambulating Pt Will Perform Toileting - Clothing Manipulation and hygiene: with supervision;sitting/lateral leans;sit to/from stand  OT Frequency: Min 2X/week    Co-evaluation              AM-PAC OT "6 Clicks" Daily Activity     Outcome Measure Help from another person eating meals?: A Little Help from another person taking care of personal grooming?: A Little Help from another person toileting, which includes using toliet, bedpan, or urinal?: A Lot Help from another person bathing (including washing, rinsing, drying)?: A Lot Help from another person to put on  and taking off regular upper body clothing?: A Little Help from another person to put on and  taking off regular lower body clothing?: A Lot 6 Click Score: 15   End of Session Equipment Utilized During Treatment: Rolling walker (2 wheels);Gait belt Nurse Communication: Mobility status  Activity Tolerance: Patient tolerated treatment well Patient left: in chair;with call bell/phone within reach;with chair alarm set  OT Visit Diagnosis: Unsteadiness on feet (R26.81);Other abnormalities of gait and mobility (R26.89);Muscle weakness (generalized) (M62.81)                Time: 1093-2355 OT Time Calculation (min): 23 min Charges:  OT General Charges $OT Visit: 1 Visit OT Evaluation $OT Eval Moderate Complexity: 1 Mod OT Treatments $Therapeutic Activity: 8-22 mins  Jia Dottavio H., OTR/L Acute Rehabilitation  Renisha Cockrum Elane Zoa Dowty 03/11/2021, 6:09 PM

## 2021-03-11 NOTE — Progress Notes (Signed)
° °  Echocardiogram 2D Echocardiogram has been performed.  Beryle Beams 03/11/2021, 11:10 AM

## 2021-03-11 NOTE — Progress Notes (Signed)
OT Cancellation Note  Patient Details Name: James Henderson MRN: [id number] DOB: [birth year]   Cancelled Treatment:    Reason Eval/Treat Not Completed: Patient at procedure or test/ unavailable. Pt receiving EEG at this time. OT will follow up as time allows.   Brandey Vandalen H., OTR/L Acute Rehabilitation  Holley Kocurek Elane Yolanda Bonine 03/11/2021, 11:09 AM

## 2021-03-11 NOTE — Progress Notes (Signed)
STROKE TEAM PROGRESS NOTE   SUBJECTIVE (INTERVAL HISTORY) No family is at the bedside.  Overall his condition is stable. He recounted HPI with me. He stated that Thursday night, he was watching TV. When got up, he had difficulty of walking, he asked his wife to help him to bed. Friday morning, he got up still has difficulty walking, seems weaker on the left leg. Wife sent him to ED. He has chronic mild dysarthria which was not changed per pt. He denies any UE weakness, facial droop or vision change.   He had stroke in 2015 and 2019 at posterior circulation but he said he had not much residue. He was here 09/2020 for stroke symptoms but MRI negative and he was thought to have recrudescence from AKI.    OBJECTIVE Temp:  [98 F (36.7 C)-99.1 F (37.3 C)] 98.9 F (37.2 C) (01/21 1144) Pulse Rate:  [57-76] 76 (01/21 1144) Cardiac Rhythm: Normal sinus rhythm (01/21 0920) Resp:  [13-25] 20 (01/21 1144) BP: (141-175)/(79-102) 147/89 (01/21 1144) SpO2:  [97 %-100 %] 100 % (01/21 1144) Weight:  [75.4 kg] 75.4 kg (01/20 2338)  Recent Labs  Lab 03/10/21 1725 03/10/21 2330 03/11/21 0621 03/11/21 1143  GLUCAP 91 114* 117* 196*   Recent Labs  Lab 03/10/21 1327  NA 140  K 3.8  CL 110  CO2 23  GLUCOSE 146*  BUN 12  CREATININE 1.36*  CALCIUM 8.9   Recent Labs  Lab 03/10/21 1327  AST 18  ALT 14  ALKPHOS 106  BILITOT 0.9  PROT 6.5  ALBUMIN 3.6   Recent Labs  Lab 03/10/21 1327  WBC 4.2  NEUTROABS 3.0  HGB 11.8*  HCT 36.3*  MCV 95.5  PLT 206   No results for input(s): CKTOTAL, CKMB, CKMBINDEX, TROPONINI in the last 168 hours. Recent Labs    03/10/21 1607  LABPROT 14.6  INR 1.1   No results for input(s): COLORURINE, LABSPEC, PHURINE, GLUCOSEU, HGBUR, BILIRUBINUR, KETONESUR, PROTEINUR, UROBILINOGEN, NITRITE, LEUKOCYTESUR in the last 72 hours.  Invalid input(s): APPERANCEUR     Component Value Date/Time   CHOL 95 03/11/2021 0127   TRIG 64 03/11/2021 0127   HDL 37 (L)  03/11/2021 0127   CHOLHDL 2.6 03/11/2021 0127   VLDL 13 03/11/2021 0127   LDLCALC 45 03/11/2021 0127   Lab Results  Component Value Date   HGBA1C 6.4 (H) 03/10/2021      Component Value Date/Time   LABOPIA NONE DETECTED 10/21/2017 1036   COCAINSCRNUR NONE DETECTED 10/21/2017 1036   LABBENZ NONE DETECTED 10/21/2017 1036   AMPHETMU NONE DETECTED 10/21/2017 1036   THCU NONE DETECTED 10/21/2017 1036   LABBARB NONE DETECTED 10/21/2017 1036    No results for input(s): ETH in the last 168 hours.  I have personally reviewed the radiological images below and agree with the radiology interpretations.  CT Head Wo Contrast  Result Date: 03/10/2021 CLINICAL DATA:  Neuro deficit, acute, stroke suspected. Weakness. Gait problems. EXAM: CT HEAD WITHOUT CONTRAST TECHNIQUE: Contiguous axial images were obtained from the base of the skull through the vertex without intravenous contrast. RADIATION DOSE REDUCTION: This exam was performed according to the departmental dose-optimization program which includes automated exposure control, adjustment of the mA and/or kV according to patient size and/or use of iterative reconstruction technique. COMPARISON:  Brain MRI 09/24/2020. MRA head 09/24/2020. Head CT 09/24/2020. FINDINGS: Brain: Moderate generalized cerebral atrophy. Comparatively mild cerebellar atrophy. 11 mm partially calcified meningioma along the undersurface of the left tentorium, unchanged in size  as compared to the head CT of 09/24/2020 (for instance as seen on series 3, image 13) (series 5, image 52). As before, there is contact upon the superior aspect of the left cerebellar hemisphere without significant mass effect. Advanced patchy and confluent hypoattenuation within the cerebral white matter, nonspecific but compatible with chronic small vessel ischemic disease. Redemonstrated chronic lacunar infarcts within the bilateral thalami. Partially empty sella turcica. There is no acute intracranial  hemorrhage. No acute  demarcated cortical infarct. No extra-axial fluid collection. No midline shift. Vascular: No hyperdense vessel.  Atherosclerotic calcifications. Skull: Normal. Negative for fracture or focal lesion. Sinuses/Orbits: Visualized orbits show no acute finding. Trace scattered paranasal sinus mucosal thickening at the imaged levels. IMPRESSION: No evidence of acute intracranial abnormality. Stable non-contrast CT appearance of the brain as compared to 09/24/2020. 11 mm partially calcified meningioma along the undersurface of the left tentorium. Advanced chronic small vessel ischemic changes within the cerebral white matter. Chronic lacunar infarcts within the bilateral thalami. Moderate generalized cerebral atrophy. Comparatively mild cerebellar atrophy. Electronically Signed   By: Kellie Simmering D.O.   On: 03/10/2021 13:47   MR Brain W and Wo Contrast  Result Date: 03/10/2021 CLINICAL DATA:  Dizziness, cardiac or vascular cause suspected, concern for stroke EXAM: MRI HEAD WITHOUT AND WITH CONTRAST TECHNIQUE: Multiplanar, multiecho pulse sequences of the brain and surrounding structures were obtained without and with intravenous contrast. CONTRAST:  7.17mL GADAVIST GADOBUTROL 1 MMOL/ML IV SOLN COMPARISON:  09/24/2020 MRI head, correlation is also made with 03/10/2021 CT head. FINDINGS: Brain: Two small foci of mildly increased signal on diffusion-weighted imaging with possible ADC correlates in the left posterior frontal lobe (series 2, image 40 and series 250, image 39; series 2, image 38 and series 250, image 38), which may represent very small subacute infarcts versus T2 shine through. No acute hemorrhage, mass, mass effect, or midline shift. No abnormal parenchymal enhancement. Confluent T2 hyperintense signal in the periventricular white matter, likely the sequela of severe lacunar infarcts in the bilateral cerebellar hemispheres and corona radiata chronic small vessel ischemic disease.  Redemonstrated diffuse foci of hemosiderin deposition throughout the bilateral cerebral and cerebellar hemispheres. Generalized cerebral volume loss, without lobar predominance. Degree of atrophy is advanced for age. No hydrocephalus or extra-axial collection. Redemonstrated posterior fossa meningioma along the inferior surface of the tentorium, which measures up to 11 mm (series 10, image 17), unchanged. No evidence of significant mass effect on the underlying cerebellum. Vascular: Normal flow voids. Skull and upper cervical spine: Normal marrow signal. Sinuses/Orbits: Negative.  Status post bilateral lens replacements. Other: None. IMPRESSION: 1. Two small foci of abnormal signal in the left posterior frontal lobe on diffusion-weighted imaging with possible ADC correlates, which may represent small subacute infarcts versus T2 shine through. 2. Redemonstrated diffuse cerebral and cerebellar microhemorrhages, in a distribution concerning for cerebral amyloid angiopathy. 3. Unchanged small left posterior fossa meningioma. Electronically Signed   By: Merilyn Baba M.D.   On: 03/10/2021 19:53   ECHOCARDIOGRAM COMPLETE  Result Date: 03/11/2021    ECHOCARDIOGRAM REPORT   Patient Name:   DECOREY WAHLERT Date of Exam: 03/11/2021 Medical Rec #:  [id number]      Height:       66.0 in Accession #:    [id number]     Weight:       166.2 lb Date of Birth:  December 09, 1950       BSA:          1.849 m Patient Age:  70 years       BP:           152/97 mmHg Patient Gender: M              HR:           84 bpm. Exam Location:  Inpatient Procedure: 2D Echo, Cardiac Doppler and Color Doppler Indications:    STROKE  History:        Patient has prior history of Echocardiogram examinations, most                 recent 10/22/2017. Stroke; Risk Factors:Diabetes and Hypertension.                 HLD.  Sonographer:    Beryle Beams Referring Phys: [id number] [place] IMPRESSIONS  1. Hyperdynamic LV with possible small mid cavitary gradient no SAM .  Left ventricular ejection fraction, by estimation, is 65 to 70%. The left ventricle has normal function. The left ventricle has no regional wall motion abnormalities. There is mild left ventricular hypertrophy. Left ventricular diastolic parameters are consistent with Grade I diastolic dysfunction (impaired relaxation).  2. Right ventricular systolic function is normal. The right ventricular size is normal.  3. The mitral valve is abnormal. Trivial mitral valve regurgitation. No evidence of mitral stenosis.  4. The aortic valve is tricuspid. There is moderate calcification of the aortic valve. There is moderate thickening of the aortic valve. Aortic valve regurgitation is not visualized. Aortic valve sclerosis/calcification is present, without any evidence of aortic stenosis.  5. The inferior vena cava is normal in size with greater than 50% respiratory variability, suggesting right atrial pressure of 3 mmHg. FINDINGS  Left Ventricle: Hyperdynamic LV with possible small mid cavitary gradient no SAM. Left ventricular ejection fraction, by estimation, is 65 to 70%. The left ventricle has normal function. The left ventricle has no regional wall motion abnormalities. The left ventricular internal cavity size was normal in size. There is mild left ventricular hypertrophy. Left ventricular diastolic parameters are consistent with Grade I diastolic dysfunction (impaired relaxation). Right Ventricle: The right ventricular size is normal. No increase in right ventricular wall thickness. Right ventricular systolic function is normal. Left Atrium: Left atrial size was normal in size. Right Atrium: Right atrial size was normal in size. Pericardium: There is no evidence of pericardial effusion. Mitral Valve: The mitral valve is abnormal. There is moderate thickening of the mitral valve leaflet(s). There is mild calcification of the mitral valve leaflet(s). Mild mitral annular calcification. Trivial mitral valve regurgitation. No  evidence of mitral valve stenosis. Tricuspid Valve: The tricuspid valve is normal in structure. Tricuspid valve regurgitation is mild . No evidence of tricuspid stenosis. Aortic Valve: The aortic valve is tricuspid. There is moderate calcification of the aortic valve. There is moderate thickening of the aortic valve. Aortic valve regurgitation is not visualized. Aortic valve sclerosis/calcification is present, without any  evidence of aortic stenosis. Aortic valve mean gradient measures 7.0 mmHg. Aortic valve peak gradient measures 12.2 mmHg. Aortic valve area, by VTI measures 2.50 cm. Pulmonic Valve: The pulmonic valve was normal in structure. Pulmonic valve regurgitation is not visualized. No evidence of pulmonic stenosis. Aorta: The aortic root is normal in size and structure. Venous: The inferior vena cava is normal in size with greater than 50% respiratory variability, suggesting right atrial pressure of 3 mmHg. IAS/Shunts: No atrial level shunt detected by color flow Doppler.  LEFT VENTRICLE PLAX 2D LVIDd:  4.00 cm LVIDs:         2.30 cm LV PW:         0.90 cm LV IVS:        1.00 cm LVOT diam:     1.90 cm LV SV:         85 LV SV Index:   46 LVOT Area:     2.84 cm  LV Volumes (MOD) LV vol d, MOD A2C: 35.0 ml LV vol d, MOD A4C: 53.6 ml LV vol s, MOD A2C: 9.1 ml LV vol s, MOD A4C: 19.5 ml LV SV MOD A2C:     25.9 ml LV SV MOD A4C:     53.6 ml LV SV MOD BP:      31.4 ml RIGHT VENTRICLE             IVC RV S prime:     16.50 cm/s  IVC diam: 1.50 cm TAPSE (M-mode): 1.8 cm LEFT ATRIUM             Index        RIGHT ATRIUM          Index LA diam:        2.60 cm 1.41 cm/m   RA Area:     8.84 cm LA Vol (A2C):   27.1 ml 14.66 ml/m  RA Volume:   17.40 ml 9.41 ml/m LA Vol (A4C):   42.9 ml 23.21 ml/m LA Biplane Vol: 34.1 ml 18.45 ml/m  AORTIC VALVE                     PULMONIC VALVE AV Area (Vmax):    2.11 cm      PV Vmax:       0.59 m/s AV Area (Vmean):   2.07 cm      PV Vmean:      40.000 cm/s AV Area  (VTI):     2.50 cm      PV VTI:        0.120 m AV Vmax:           175.00 cm/s   PV Peak grad:  1.4 mmHg AV Vmean:          118.500 cm/s  PV Mean grad:  1.0 mmHg AV VTI:            0.342 m AV Peak Grad:      12.2 mmHg AV Mean Grad:      7.0 mmHg LVOT Vmax:         130.00 cm/s LVOT Vmean:        86.700 cm/s LVOT VTI:          0.301 m LVOT/AV VTI ratio: 0.88  AORTA Ao Root diam: 2.90 cm Ao Asc diam:  2.80 cm MITRAL VALVE MV Area (PHT): 1.97 cm     SHUNTS MV Decel Time: 385 msec     Systemic VTI:  0.30 m MV E velocity: 86.30 cm/s   Systemic Diam: 1.90 cm MV A velocity: 126.00 cm/s MV E/A ratio:  0.68 Jenkins Rouge MD Electronically signed by Jenkins Rouge MD Signature Date/Time: 03/11/2021/1:43:07 PM    Final      PHYSICAL EXAM  Temp:  [98 F (36.7 C)-99.1 F (37.3 C)] 98.9 F (37.2 C) (01/21 1144) Pulse Rate:  [57-76] 76 (01/21 1144) Resp:  [13-25] 20 (01/21 1144) BP: (141-175)/(79-102) 147/89 (01/21 1144) SpO2:  [97 %-100 %] 100 % (01/21 1144) Weight:  [75.4 kg] 75.4  kg (01/20 2338)  General - Well nourished, well developed, in no apparent distress.  Ophthalmologic - fundi not visualized due to noncooperation.  Cardiovascular - Regular rhythm and rate.  Mental Status -  Level of arousal and orientation to time, place, and person were intact. No aphasia, following simple commands, but bradyphonia, psychomotor slowing, mild to moderate dysarthria. Naming 1/4, able to repeat simple sentence but not complicated sentences  Cranial Nerves II - XII - II - Visual field intact OU. III, IV, VI - Extraocular movements intact. V - Facial sensation intact bilaterally. VII - Facial movement intact bilaterally. VIII - Hearing & vestibular intact bilaterally. X - Palate elevates symmetrically. XI - Chin turning & shoulder shrug intact bilaterally. XII - Tongue protrusion intact.  Motor Strength - The patients strength was normal in all extremities and pronator drift was absent.  Bulk was normal and  fasciculations were absent.   Motor Tone - Muscle tone was assessed at the neck and appendages and was normal.  Reflexes - The patients reflexes were symmetrical in all extremities and he had no pathological reflexes.  Sensory - Light touch, temperature/pinprick were assessed and were symmetrical.    Coordination - The patient had grossly normal movements in the right hand and b/l feet with no ataxia or dysmetria, however slow and mild dysmetria on the left FTN.  Tremor was absent.  Gait and Station - deferred.   ASSESSMENT/PLAN Mr. Bilal Manzer is a 71 y.o. male with history of CKD, DM, HTN, HLD, stroke, CAA, left posterior fossa meningioma admitted for unsteady gait. No tPA given due to OOW.    ?Stroke?TIA ?TFNE ?recrudescence  CT no acute finding MRI  ? Subacute left frontal WM infarct vs. T2 shine through Limited MRI repeat pending CTA head and neck pending 2D Echo  EF 65-70% LDL 45 HgbA1c 6.4 lovenox for VTE prophylaxis clopidogrel 75 mg daily prior to admission, now on clopidogrel 75 mg daily. No DAPT given likely CAA on MRI Ongoing aggressive stroke risk factor management Therapy recommendations:  HH PT/OT Disposition:  pending  Hx of stroke admitted on 12/06/13 for small midbrain infarct. MRA also showed widespread microbleeds, likely concerning for CAA. He was put on ASA, did not escalate antiplatelet due to extensive microbleeds. Admitted on 10/21/2017 for difficulty walking, nausea vomiting, headache and generalized weakness.  CT head and neck showed diffuse intracranial atherosclerosis.  MRI showed left brachium pontis acute infarct.  EF 55 to 60%, LDL 82 and A1c 6.2.  Discharged on Plavix given extensive microhemorrhages. Admitted on 09/24/2020 for worsening weakness and confusion in the setting of AKI.  MRI no acute infarct.  EEG no seizure. Follow with [name], on Plavix, statin.  I also considered Aricept due to mixed vascular and Alzheimer's  dementia.  CAA Mixed vascular and Alzheimer's dementia on aricept MRI showed extensive microhemorrhages more concerning for CAA On Plavix, avoid DAPT Strict BP control, goal less than 140  Diabetes HgbA1c 6.4 goal < 7.0 Controlled CBG monitoring SSI DM education and close PCP follow up  Hypertension Home amlodipine 5 and cozaar 100 Stable on the high end Resume home amlodipine and cozaar BP goal < 140 due to CAA  Hyperlipidemia Home meds:  zetia and crestor 40  LDL 45, goal < 70 Now on zetia and crestor 40 Continue statin at discharge  Other Stroke Risk Factors Advanced age  Other Active Problems CKD, Cre 1.36 Left posterior fossa meningioma mixed vascular and Alzheimer's dementia followed with Dr.  [facility] day # 0  I discussed with Dr. Bonner Puna. I spent extensive time with the patient, more than 50% of which was spent in counseling and coordination of care, reviewing test results, images and medication, and discussing the diagnosis, treatment plan and potential prognosis. This patient's care requiresreview of multiple databases, neurological assessment, discussion with family, other specialists and medical decision making of high complexity.    Rosalin Hawking, MD PhD Stroke Neurology 03/11/2021 3:12 PM    To contact Stroke Continuity provider, please refer to [email or web address] After hours, contact General Neurology

## 2021-03-11 NOTE — Progress Notes (Signed)
PROGRESS NOTE  James Henderson  XLK:[id number] DOB: [birth year] DOA: 03/10/2021 PCP: Lois Huxley, PA   Brief Narrative: James Henderson is a 71 y.o. male with a history of brainstem stroke, meningioma, T2DM, HTN, , stage III CKD, HLD and glaucoma who presented to the ED 1/20 with abrupt onset of unsteady gait. Head CT revealed an 75mm meningioma, chronic lacunar thalamic infarcts, and cerebral atrophy with advanced chronic small vessel ischemic changes. No acute abnormality was noted. Subsequent MRI revealed two small foci in the left posterior frontal lobe concerning for subacute infarct vs. T2 shine through. There was also note of diffuse microhemorrhages suggestive of cerebral amyloid angiopathy. Neurology was consulted, recommending admission and stroke work up which is underway.   Assessment & Plan: Principal Problem:   Unsteady gait Active Problems:   Essential hypertension   DM (diabetes mellitus) type II controlled, neurological manifestation (HCC)   HLD (hyperlipidemia)   History of CVA (cerebrovascular accident)   Stage 3 chronic kidney disease (HCC)  Acute left parietal CVA:  - PT rec's HH. OT pending - Echocardiogram pending - Stroke neurology recommendations from 1/21 are pending. Currently planning DAPT (was on plavix PTA). Also continuing high-intensity statin as LDL is at goal.  T2DM: Well-controlled with HbA1c 6.4%.   - Hold metformin, glipizide and give SSI here. As long as no hypoglycemic episodes at home, will not plan to change regimen at discharge.    HTN: Stable elevation, not severe range. - Permissive HTN for now. Plan to restart home norvasc, losartan gradually   Stage IIIa CKD: Stable.  Alzheimer's and vascular dementia: Followed by [name] - Continue aricept  HLD:  - Continue rosuvastatin and zetia since these appear to be tolerated. LDL is 45!    OAB:  - Continue formulary meds for home tolterodine, vibegron  Glaucoma:  - Continue  gtt's  Posterior fossa meningioma: Stable.  DVT prophylaxis: Lovenox Code Status: Full Family Communication: None at bedside. Will communicate once work up completed Disposition Plan:  Status is: Observation  The patient remains OBS appropriate and will d/c before 2 midnights.  Consultants:  Neurology  Procedures:  Echo  Antimicrobials: None   Subjective: Pt confirms history and finds it difficult to recall any detail. No current chest pain, dyspnea, abd pain, N/V/D. No leg swelling. No vision changes, eye/head pain, weakness or numbness. Eating breakfast without issues.   Objective: Vitals:   03/10/21 2338 03/11/21 0320 03/11/21 0346 03/11/21 0816  BP:  (!) 152/97  (!) 141/86  Pulse:   67 70  Resp:  17  18  Temp:   98 F (36.7 C) 98.4 F (36.9 C)  TempSrc:   Oral Oral  SpO2:   97% 99%  Weight: 75.4 kg     Height: 5\' 6"  (1.676 m)       Intake/Output Summary (Last 24 hours) at 03/11/2021 1138 Last data filed at 03/11/2021 0900 Gross per 24 hour  Intake 320 ml  Output --  Net 320 ml   Filed Weights   03/10/21 2338  Weight: 75.4 kg    Gen: 71 y.o. male in no distress Pulm: Non-labored breathing room air. Clear to auscultation bilaterally.  CV: Regular rate and rhythm. No murmur, rub, or gallop. No JVD, no pedal edema. GI: Abdomen soft, non-tender, non-distended, with normoactive bowel sounds. No organomegaly or masses felt. Ext: Warm, no deformities Skin: No rashes, lesions or ulcers on visualized skin Neuro: Alert and incompletely oriented. No dysarthria or focal sensory or motor deficits.  Psych: Judgement and insight appear impaired. Mood & affect appropriate.   Data Reviewed: I have personally reviewed following labs and imaging studies  CBC: Recent Labs  Lab 03/10/21 1327  WBC 4.2  NEUTROABS 3.0  HGB 11.8*  HCT 36.3*  MCV 95.5  PLT 720   Basic Metabolic Panel: Recent Labs  Lab 03/10/21 1327  NA 140  K 3.8  CL 110  CO2 23  GLUCOSE 146*   BUN 12  CREATININE 1.36*  CALCIUM 8.9   GFR: Estimated Creatinine Clearance: 44.3 mL/min (A) (by C-G formula based on SCr of 1.36 mg/dL (H)). Liver Function Tests: Recent Labs  Lab 03/10/21 1327  AST 18  ALT 14  ALKPHOS 106  BILITOT 0.9  PROT 6.5  ALBUMIN 3.6   No results for input(s): LIPASE, AMYLASE in the last 168 hours. No results for input(s): AMMONIA in the last 168 hours. Coagulation Profile: Recent Labs  Lab 03/10/21 1607  INR 1.1   Cardiac Enzymes: No results for input(s): CKTOTAL, CKMB, CKMBINDEX, TROPONINI in the last 168 hours. BNP (last 3 results) No results for input(s): PROBNP in the last 8760 hours. HbA1C: Recent Labs    03/10/21 1700  HGBA1C 6.4*   CBG: Recent Labs  Lab 03/10/21 1725 03/10/21 2330 03/11/21 0621  GLUCAP 91 114* 117*   Lipid Profile: Recent Labs    03/11/21 0127  CHOL 95  HDL 37*  LDLCALC 45  TRIG 64  CHOLHDL 2.6   Thyroid Function Tests: No results for input(s): TSH, T4TOTAL, FREET4, T3FREE, THYROIDAB in the last 72 hours. Anemia Panel: No results for input(s): VITAMINB12, FOLATE, FERRITIN, TIBC, IRON, RETICCTPCT in the last 72 hours. Urine analysis:    Component Value Date/Time   COLORURINE YELLOW 09/24/2020 2315   APPEARANCEUR CLEAR 09/24/2020 2315   LABSPEC 1.017 09/24/2020 2315   PHURINE 5.0 09/24/2020 2315   GLUCOSEU NEGATIVE 09/24/2020 2315   HGBUR NEGATIVE 09/24/2020 2315   BILIRUBINUR NEGATIVE 09/24/2020 2315   KETONESUR NEGATIVE 09/24/2020 2315   PROTEINUR 30 (A) 09/24/2020 2315   UROBILINOGEN 0.2 12/07/2013 0032   NITRITE NEGATIVE 09/24/2020 2315   LEUKOCYTESUR NEGATIVE 09/24/2020 2315   Recent Results (from the past 240 hour(s))  Resp Panel by RT-PCR (Flu A&B, Covid) Nasopharyngeal Swab     Status: None   Collection Time: 03/10/21  4:29 PM   Specimen: Nasopharyngeal Swab; Nasopharyngeal(NP) swabs in vial transport medium  Result Value Ref Range Status   SARS Coronavirus 2 by RT PCR NEGATIVE  NEGATIVE Final    Comment: (NOTE) SARS-CoV-2 target nucleic acids are NOT DETECTED.  The SARS-CoV-2 RNA is generally detectable in upper respiratory specimens during the acute phase of infection. The lowest concentration of SARS-CoV-2 viral copies this assay can detect is 138 copies/mL. A negative result does not preclude SARS-Cov-2 infection and should not be used as the sole basis for treatment or other patient management decisions. A negative result may occur with  improper specimen collection/handling, submission of specimen other than nasopharyngeal swab, presence of viral mutation(s) within the areas targeted by this assay, and inadequate number of viral copies(<138 copies/mL). A negative result must be combined with clinical observations, patient history, and epidemiological information. The expected result is Negative.  Fact Sheet for Patients:  EntrepreneurPulse.com.au  Fact Sheet for Healthcare Providers:  IncredibleEmployment.be  This test is no t yet approved or cleared by the [place] FDA and  has been authorized for detection and/or diagnosis of SARS-CoV-2 by FDA under an Emergency Use Authorization (EUA).  This EUA will remain  in effect (meaning this test can be used) for the duration of the COVID-19 declaration under Section 564(b)(1) of the Act, 21 U.S.C.section 360bbb-3(b)(1), unless the authorization is terminated  or revoked sooner.       Influenza A by PCR NEGATIVE NEGATIVE Final   Influenza B by PCR NEGATIVE NEGATIVE Final    Comment: (NOTE) The Xpert Xpress SARS-CoV-2/FLU/RSV plus assay is intended as an aid in the diagnosis of influenza from Nasopharyngeal swab specimens and should not be used as a sole basis for treatment. Nasal washings and aspirates are unacceptable for Xpert Xpress SARS-CoV-2/FLU/RSV testing.  Fact Sheet for Patients: EntrepreneurPulse.com.au  Fact Sheet for Healthcare  Providers: IncredibleEmployment.be  This test is not yet approved or cleared by the [place] FDA and has been authorized for detection and/or diagnosis of SARS-CoV-2 by FDA under an Emergency Use Authorization (EUA). This EUA will remain in effect (meaning this test can be used) for the duration of the COVID-19 declaration under Section 564(b)(1) of the Act, 21 U.S.C. section 360bbb-3(b)(1), unless the authorization is terminated or revoked.  Performed at [facility], [place] [address], [place], [place] [address]       Radiology Studies: CT Head Wo Contrast  Result Date: 03/10/2021 CLINICAL DATA:  Neuro deficit, acute, stroke suspected. Weakness. Gait problems. EXAM: CT HEAD WITHOUT CONTRAST TECHNIQUE: Contiguous axial images were obtained from the base of the skull through the vertex without intravenous contrast. RADIATION DOSE REDUCTION: This exam was performed according to the departmental dose-optimization program which includes automated exposure control, adjustment of the mA and/or kV according to patient size and/or use of iterative reconstruction technique. COMPARISON:  Brain MRI 09/24/2020. MRA head 09/24/2020. Head CT 09/24/2020. FINDINGS: Brain: Moderate generalized cerebral atrophy. Comparatively mild cerebellar atrophy. 11 mm partially calcified meningioma along the undersurface of the left tentorium, unchanged in size as compared to the head CT of 09/24/2020 (for instance as seen on series 3, image 13) (series 5, image 52). As before, there is contact upon the superior aspect of the left cerebellar hemisphere without significant mass effect. Advanced patchy and confluent hypoattenuation within the cerebral white matter, nonspecific but compatible with chronic small vessel ischemic disease. Redemonstrated chronic lacunar infarcts within the bilateral thalami. Partially empty sella turcica. There is no acute intracranial hemorrhage. No acute  demarcated  cortical infarct. No extra-axial fluid collection. No midline shift. Vascular: No hyperdense vessel.  Atherosclerotic calcifications. Skull: Normal. Negative for fracture or focal lesion. Sinuses/Orbits: Visualized orbits show no acute finding. Trace scattered paranasal sinus mucosal thickening at the imaged levels. IMPRESSION: No evidence of acute intracranial abnormality. Stable non-contrast CT appearance of the brain as compared to 09/24/2020. 11 mm partially calcified meningioma along the undersurface of the left tentorium. Advanced chronic small vessel ischemic changes within the cerebral white matter. Chronic lacunar infarcts within the bilateral thalami. Moderate generalized cerebral atrophy. Comparatively mild cerebellar atrophy. Electronically Signed   By: Kellie Simmering D.O.   On: 03/10/2021 13:47   MR Brain W and Wo Contrast  Result Date: 03/10/2021 CLINICAL DATA:  Dizziness, cardiac or vascular cause suspected, concern for stroke EXAM: MRI HEAD WITHOUT AND WITH CONTRAST TECHNIQUE: Multiplanar, multiecho pulse sequences of the brain and surrounding structures were obtained without and with intravenous contrast. CONTRAST:  7.20mL GADAVIST GADOBUTROL 1 MMOL/ML IV SOLN COMPARISON:  09/24/2020 MRI head, correlation is also made with 03/10/2021 CT head. FINDINGS: Brain: Two small foci of mildly increased signal on diffusion-weighted imaging with possible  ADC correlates in the left posterior frontal lobe (series 2, image 40 and series 250, image 39; series 2, image 38 and series 250, image 38), which may represent very small subacute infarcts versus T2 shine through. No acute hemorrhage, mass, mass effect, or midline shift. No abnormal parenchymal enhancement. Confluent T2 hyperintense signal in the periventricular white matter, likely the sequela of severe lacunar infarcts in the bilateral cerebellar hemispheres and corona radiata chronic small vessel ischemic disease. Redemonstrated diffuse foci of  hemosiderin deposition throughout the bilateral cerebral and cerebellar hemispheres. Generalized cerebral volume loss, without lobar predominance. Degree of atrophy is advanced for age. No hydrocephalus or extra-axial collection. Redemonstrated posterior fossa meningioma along the inferior surface of the tentorium, which measures up to 11 mm (series 10, image 17), unchanged. No evidence of significant mass effect on the underlying cerebellum. Vascular: Normal flow voids. Skull and upper cervical spine: Normal marrow signal. Sinuses/Orbits: Negative.  Status post bilateral lens replacements. Other: None. IMPRESSION: 1. Two small foci of abnormal signal in the left posterior frontal lobe on diffusion-weighted imaging with possible ADC correlates, which may represent small subacute infarcts versus T2 shine through. 2. Redemonstrated diffuse cerebral and cerebellar microhemorrhages, in a distribution concerning for cerebral amyloid angiopathy. 3. Unchanged small left posterior fossa meningioma. Electronically Signed   By: Merilyn Baba M.D.   On: 03/10/2021 19:53    Scheduled Meds:  aspirin EC  81 mg Oral Daily   brinzolamide  1 drop Both Eyes TID   And   brimonidine  1 drop Both Eyes TID   cholecalciferol  5,000 Units Oral Daily   clopidogrel  75 mg Oral Daily   donepezil  10 mg Oral QHS   enoxaparin (LOVENOX) injection  40 mg Subcutaneous Q24H   ezetimibe  10 mg Oral Daily   fesoterodine  4 mg Oral Daily   insulin aspart  0-9 Units Subcutaneous TID WC   rosuvastatin  40 mg Oral q1800   Continuous Infusions:   LOS: 0 days    Patrecia Pour, MD [facility] [email or web address] 03/11/2021, 11:38 AM

## 2021-03-12 DIAGNOSIS — R2681 Unsteadiness on feet: Secondary | ICD-10-CM | POA: Diagnosis not present

## 2021-03-12 DIAGNOSIS — E785 Hyperlipidemia, unspecified: Secondary | ICD-10-CM | POA: Diagnosis not present

## 2021-03-12 DIAGNOSIS — Z8673 Personal history of transient ischemic attack (TIA), and cerebral infarction without residual deficits: Secondary | ICD-10-CM | POA: Diagnosis not present

## 2021-03-12 DIAGNOSIS — I10 Essential (primary) hypertension: Secondary | ICD-10-CM | POA: Diagnosis not present

## 2021-03-12 DIAGNOSIS — I63312 Cerebral infarction due to thrombosis of left middle cerebral artery: Secondary | ICD-10-CM | POA: Diagnosis not present

## 2021-03-12 DIAGNOSIS — E1140 Type 2 diabetes mellitus with diabetic neuropathy, unspecified: Secondary | ICD-10-CM | POA: Diagnosis not present

## 2021-03-12 DIAGNOSIS — N1831 Chronic kidney disease, stage 3a: Secondary | ICD-10-CM | POA: Diagnosis not present

## 2021-03-12 LAB — CBC
HCT: 34.6 % — ABNORMAL LOW (ref 39.0–52.0)
Hemoglobin: 11.6 g/dL — ABNORMAL LOW (ref 13.0–17.0)
MCH: 31.1 pg (ref 26.0–34.0)
MCHC: 33.5 g/dL (ref 30.0–36.0)
MCV: 92.8 fL (ref 80.0–100.0)
Platelets: 203 10*3/uL (ref 150–400)
RBC: 3.73 MIL/uL — ABNORMAL LOW (ref 4.22–5.81)
RDW: 12.8 % (ref 11.5–15.5)
WBC: 4.8 10*3/uL (ref 4.0–10.5)
nRBC: 0 % (ref 0.0–0.2)

## 2021-03-12 LAB — GLUCOSE, CAPILLARY
Glucose-Capillary: 143 mg/dL — ABNORMAL HIGH (ref 70–99)
Glucose-Capillary: 203 mg/dL — ABNORMAL HIGH (ref 70–99)
Glucose-Capillary: 124 mg/dL — ABNORMAL HIGH (ref 70–99)

## 2021-03-12 LAB — BASIC METABOLIC PANEL
Anion gap: 8 (ref 5–15)
BUN: 14 mg/dL (ref 8–23)
CO2: 24 mmol/L (ref 22–32)
Calcium: 8.7 mg/dL — ABNORMAL LOW (ref 8.9–10.3)
Chloride: 107 mmol/L (ref 98–111)
Creatinine, Ser: 1.36 mg/dL — ABNORMAL HIGH (ref 0.61–1.24)
GFR, Estimated: 55 mL/min — ABNORMAL LOW (ref >60)
Glucose, Bld: 146 mg/dL — ABNORMAL HIGH (ref 70–99)
Potassium: 3.9 mmol/L (ref 3.5–5.1)
Sodium: 139 mmol/L (ref 135–145)

## 2021-03-12 MED ORDER — AMLODIPINE BESYLATE 10 MG PO TABS: 10.0000 mg | ORAL_TABLET | Freq: Every day | ORAL | 0 refills | Status: DC

## 2021-03-12 MED ORDER — AMLODIPINE BESYLATE 10 MG PO TABS
10.0000 mg | ORAL_TABLET | Freq: Every day | ORAL | Status: DC
  Administered 2021-03-12: 10 mg via ORAL
  Filled 2021-03-12: qty 1

## 2021-03-12 NOTE — Discharge Summary (Signed)
Physician Discharge Summary  James Henderson KGM:[id number] DOB: 04-12-1950 DOA: 03/10/2021  PCP: James Huxley, PA  Admit date: 03/10/2021 Discharge date: 03/12/2021  Admitted From: Home Disposition: Home   Recommendations for Outpatient Follow-up:  Follow up with PCP in 1-2 weeks for continued management of HTN (with goal SBP <140 due to cerebral amyloid angiopathy norvasc dose was increased 5mg  > 10mg ), T2DM, and HLD.  Follow up with neurology/James Henderson: PT, OT Equipment/Devices: None new Discharge Condition: Stable CODE STATUS: Full Diet recommendation: Heart healthy, carb-modified  Brief/Interim Summary: James Henderson is a 71 y.o. male with a history of brainstem stroke, meningioma, T2DM, HTN, stage III CKD, HLD and glaucoma who presented to the ED 1/20 with abrupt onset of unsteady gait. Head CT revealed an 17mm meningioma, chronic lacunar thalamic infarcts, and cerebral atrophy with advanced chronic small vessel ischemic changes. No acute abnormality was noted. Subsequent MRI revealed two small foci in the left posterior frontal lobe concerning for subacute infarct vs. T2 shine through. There was also note of diffuse microhemorrhages suggestive of cerebral amyloid angiopathy. Neurology was consulted, recommending admission and stroke work up. CTA head and neck revealed severe proximal left P2 stenosis, and repeat limited MRI confirmed subacute left frontal white matter infarct. Additional antiplatelet agent was not recommended due to concern for microhemorrhages/CAA, and LDL was at goal (45). HbA1c also at goal at 6.4%. The patient will benefit from home health physical and occupational therapy after discharge which will be arranged.  Discharge Diagnoses:  Principal Problem:   Unsteady gait Active Problems:   Essential hypertension   DM (diabetes mellitus) type II controlled, neurological manifestation (HCC)   HLD (hyperlipidemia)   History of CVA (cerebrovascular  accident)   Stage 3 chronic kidney disease (HCC)  Acute left frontal white matter infarct/CVA: On background of extensive chronic microhemorrhage throughout the brain suggestive of cerebral amyloid angiopathy. No CES on echocardiogram, low suspicion for embolic phenomenon.  - Continue statin/zetia, LDL at goal.  - Continue diabetes regimen as HbA1c is at goal. - Continue plavix. No addition of aspirin or anticoagulation due to microhemorrhages.  - HHPT, James Henderson - Follow up with outpatient neurologist after discharge.    T2DM: Well-controlled with HbA1c 6.4%.   - As long as no hypoglycemic episodes at home, will not plan to change regimen at discharge.    HTN: Stable elevation, not severe range. - Goal SBP per neurology is <140. The patient was above that after home norvasc and losartan were resumed, so norvasc dose will be increased.    Stage IIIa CKD: Stable.   Alzheimer's and vascular dementia: Followed by Dr. Tomi Henderson - Continue aricept   HLD:  - Continue rosuvastatin and zetia since these appear to be tolerated. LDL is 45!    OAB:  - Continue tolterodine, vibegron   Glaucoma:  - Continue gtt's   Posterior fossa meningioma: Stable.  Discharge Instructions Discharge Instructions     Ambulatory referral to Neurology   Complete by: As directed    Follow up with Dr. Tomi Henderson at [facility] in 4-6 weeks.  Patient is Dr. Talbert Henderson patient. Thanks.   Diet - low sodium heart healthy   Complete by: As directed    Diet Carb Modified   Complete by: As directed    Discharge instructions   Complete by: As directed    You were admitted for a small stroke and you have completed the work up. The stroke neurologist recommends continuing plavix 75mg  daily, and targeting  a lower goal blood pressure. To bring blood pressure down, take an increased dose of norvasc (from 5mg  once daily to 10mg  once daily). Continue taking other medications as you were. It is important for you to follow up with your PCP for  continued medical management and with Dr. Tomi Henderson. To assist with getting strength back, home health therapies have been ordered for you at discharge. If your symptoms return, seek medical attention right away.   Increase activity slowly   Complete by: As directed       Allergies as of 03/12/2021   No Known Allergies      Medication List     TAKE these medications    acetaminophen 500 MG tablet Commonly known as: TYLENOL Take 1,000 mg by mouth every 6 (six) hours as needed for headache (pain).   amLODipine 10 MG tablet Commonly known as: NORVASC Take 1 tablet (10 mg total) by mouth daily. What changed:  medication strength how much to take   Brinzolamide-Brimonidine 1-0.2 % Susp Place 1 drop into both eyes 2 (two) times daily.   clopidogrel 75 MG tablet Commonly known as: PLAVIX Take 1 tablet (75 mg total) by mouth daily.   donepezil 10 MG tablet Commonly known as: ARICEPT TAKE ONE TABLET BY MOUTH EVERYDAY AT BEDTIME What changed: See the new instructions.   ezetimibe 10 MG tablet Commonly known as: ZETIA Take 10 mg by mouth daily.   Fish Oil 500 MG Caps Take 500 mg by mouth every evening.   Gemtesa 75 MG Tabs Generic drug: Vibegron Take 75 mg by mouth daily.   glipiZIDE 5 MG tablet Commonly known as: GLUCOTROL Take 5 mg by mouth daily.   Inositol Niacinate 500 MG Caps Take 500 mg by mouth every evening.   losartan 100 MG tablet Commonly known as: COZAAR Take 100 mg by mouth daily.   metFORMIN 1000 MG tablet Commonly known as: GLUCOPHAGE Take 500-1,000 mg by mouth See admin instructions. Take1000 mg in the morning and 500 mg in the evening   rosuvastatin 40 MG tablet Commonly known as: CRESTOR Take 1 tablet (40 mg total) by mouth daily at 6 PM.   tolterodine 4 MG 24 hr capsule Commonly known as: DETROL LA Take 4 mg by mouth daily.   Vitamin D-3 125 MCG (5000 UT) Tabs Take 5,000 Units by mouth daily.        Follow-up Information     James Partridge, DO. Schedule an appointment as soon as possible for a visit in 1 month(s).   Specialty: Neurology Contact information: [address] [place] [id number] [phone]         James Huxley, PA Follow up.   Specialty: Family Medicine Contact information: [place]  [phone]                No Known Allergies  Consultations: Neurology  Procedures/Studies: CT ANGIO HEAD NECK W WO CM  Result Date: 03/11/2021 CLINICAL DATA:  Follow-up examination for stroke. EXAM: CT ANGIOGRAPHY HEAD AND NECK TECHNIQUE: Multidetector CT imaging of the head and neck was performed using the standard protocol during bolus administration of intravenous contrast. Multiplanar CT image reconstructions and MIPs were obtained to evaluate the vascular anatomy. Carotid stenosis measurements (when applicable) are obtained utilizing NASCET criteria, using the distal internal carotid diameter as the denominator. RADIATION DOSE REDUCTION: This exam was performed according to the departmental dose-optimization program which includes automated exposure  control, adjustment of the mA and/or kV according to patient size and/or use of iterative reconstruction technique. CONTRAST:  28mL OMNIPAQUE IOHEXOL 350 MG/ML SOLN COMPARISON:  MRI and CT from 03/10/2021. FINDINGS: CT HEAD FINDINGS Brain: Age-related cerebral atrophy with advanced chronic microvascular ischemic disease. No acute intracranial hemorrhage. No visible acute large vessel territory infarct by CT. Calcified meningioma along the left tentorium again noted. No midline shift or mass effect. No hydrocephalus or extra-axial fluid collection. Vascular: No hyperdense vessel. Skull: Scalp soft tissues and calvarium within normal limits. Sinuses: Clear. Orbits: Unremarkable. Review of the MIP images confirms the above findings CTA NECK FINDINGS Aortic arch: Visualized aortic arch normal caliber with normal  3 vessel morphology. Mild atheromatous change about the arch itself. No stenosis about the origin of the great vessels. Right carotid system: Right common and internal carotid arteries widely patent without stenosis, dissection or occlusion. Left carotid system: Left common and internal carotid arteries widely patent without stenosis, dissection or occlusion. Vertebral arteries: Both vertebral arteries arise from the subclavian arteries. No proximal subclavian artery stenosis. Both vertebral arteries widely patent without stenosis, dissection or occlusion. Skeleton: No discrete or worrisome osseous lesions. Moderately advanced multilevel spondylosis noted throughout the visualized spine. Poor dentition noted. Other neck: No other acute soft tissue abnormality within the neck. Upper chest: Visualized upper chest demonstrates no acute finding. Review of the MIP images confirms the above findings CTA HEAD FINDINGS Anterior circulation: Petrous segments patent bilaterally. Mild atheromatous change within the carotid siphons without hemodynamically significant stenosis. A1 segments patent bilaterally. Left A1 hypoplastic. Normal anterior communicating artery complex. Anterior cerebral arteries patent without high-grade stenosis. No M1 stenosis or occlusion. Normal MCA bifurcations. Moderate distal small vessel atheromatous irregularity. No proximal MCA branch occlusion. Posterior circulation: Both V4 segments patent to the vertebrobasilar junction without stenosis. Both PICA origins patent and normal. Basilar widely patent to its distal aspect without stenosis. Superior cerebellar arteries patent bilaterally. Both PCAs primarily supplied via the basilar. Right PCA mildly irregular but patent to its distal aspect without flow-limiting stenosis. Focal severe proximal left P2 stenosis present (series 11, image 100). Left PCA otherwise remains patent to its distal aspect. Venous sinuses: Patent allowing for timing the  contrast bolus. Anatomic variants: Hypoplastic left A1 segment.  No aneurysm. Review of the MIP images confirms the above findings IMPRESSION: CT HEAD IMPRESSION: 1. Stable head CT.  No new acute intracranial abnormality. 2. Age-related cerebral atrophy with advanced chronic microvascular ischemic disease. 3. 11 mm left tentorial meningioma without mass effect. CTA HEAD AND NECK IMPRESSION: 1. Negative CTA for large vessel occlusion. 2. Severe proximal left P2 stenosis. 3. Mild-to-moderate atheromatous irregularity elsewhere about the major arterial vasculature of the head and neck. No other hemodynamically significant or correctable stenosis. Electronically Signed   By: Jeannine Boga M.D.   On: 03/11/2021 19:38   CT Head Wo Contrast  Result Date: 03/10/2021 CLINICAL DATA:  Neuro deficit, acute, stroke suspected. Weakness. Gait problems. EXAM: CT HEAD WITHOUT CONTRAST TECHNIQUE: Contiguous axial images were obtained from the base of the skull through the vertex without intravenous contrast. RADIATION DOSE REDUCTION: This exam was performed according to the departmental dose-optimization program which includes automated exposure control, adjustment of the mA and/or kV according to patient size and/or use of iterative reconstruction technique. COMPARISON:  Brain MRI 09/24/2020. MRA head 09/24/2020. Head CT 09/24/2020. FINDINGS: Brain: Moderate generalized cerebral atrophy. Comparatively mild cerebellar atrophy. 11 mm partially calcified meningioma along the undersurface of the left tentorium, unchanged in  size as compared to the head CT of 09/24/2020 (for instance as seen on series 3, image 13) (series 5, image 52). As before, there is contact upon the superior aspect of the left cerebellar hemisphere without significant mass effect. Advanced patchy and confluent hypoattenuation within the cerebral white matter, nonspecific but compatible with chronic small vessel ischemic disease. Redemonstrated chronic  lacunar infarcts within the bilateral thalami. Partially empty sella turcica. There is no acute intracranial hemorrhage. No acute  demarcated cortical infarct. No extra-axial fluid collection. No midline shift. Vascular: No hyperdense vessel.  Atherosclerotic calcifications. Skull: Normal. Negative for fracture or focal lesion. Sinuses/Orbits: Visualized orbits show no acute finding. Trace scattered paranasal sinus mucosal thickening at the imaged levels. IMPRESSION: No evidence of acute intracranial abnormality. Stable non-contrast CT appearance of the brain as compared to 09/24/2020. 11 mm partially calcified meningioma along the undersurface of the left tentorium. Advanced chronic small vessel ischemic changes within the cerebral white matter. Chronic lacunar infarcts within the bilateral thalami. Moderate generalized cerebral atrophy. Comparatively mild cerebellar atrophy. Electronically Signed   By: Kellie Simmering D.O.   On: 03/10/2021 13:47   MR BRAIN WO CONTRAST  Result Date: 03/11/2021 CLINICAL DATA:  Follow-up stroke EXAM: MRI HEAD WITHOUT CONTRAST TECHNIQUE: Multiplanar, multiecho pulse sequences of the brain and surrounding structures were obtained without intravenous contrast. COMPARISON:  CT head 03/11/2021.  MRI head 03/10/2021 FINDINGS: Study limited to diffusion weighted imaging and SWI per Dr. Erlinda Hong. Diffusion-weighted imaging again mildly positive in the left posterior frontal white matter unchanged. This may represent a recent infarct. No new area of restricted diffusion identified compared to the recent study. Severe diffuse chronic microhemorrhage. Greater than 50 foci of microhemorrhage are present in both cerebral hemispheres, brainstem and basal ganglia, and throughout both cerebral hemispheres. This is unchanged from the prior study IMPRESSION: No change in the small area of restricted diffusion left frontal white matter likely due to recent infarct. Extensive chronic microhemorrhage  throughout the brain. This may be due to cerebral amyloid. Electronically Signed   By: Franchot Gallo M.D.   On: 03/11/2021 19:58   MR Brain W and Wo Contrast  Result Date: 03/10/2021 CLINICAL DATA:  Dizziness, cardiac or vascular cause suspected, concern for stroke EXAM: MRI HEAD WITHOUT AND WITH CONTRAST TECHNIQUE: Multiplanar, multiecho pulse sequences of the brain and surrounding structures were obtained without and with intravenous contrast. CONTRAST:  7.58mL GADAVIST GADOBUTROL 1 MMOL/ML IV SOLN COMPARISON:  09/24/2020 MRI head, correlation is also made with 03/10/2021 CT head. FINDINGS: Brain: Two small foci of mildly increased signal on diffusion-weighted imaging with possible ADC correlates in the left posterior frontal lobe (series 2, image 40 and series 250, image 39; series 2, image 38 and series 250, image 38), which may represent very small subacute infarcts versus T2 shine through. No acute hemorrhage, mass, mass effect, or midline shift. No abnormal parenchymal enhancement. Confluent T2 hyperintense signal in the periventricular white matter, likely the sequela of severe lacunar infarcts in the bilateral cerebellar hemispheres and corona radiata chronic small vessel ischemic disease. Redemonstrated diffuse foci of hemosiderin deposition throughout the bilateral cerebral and cerebellar hemispheres. Generalized cerebral volume loss, without lobar predominance. Degree of atrophy is advanced for age. No hydrocephalus or extra-axial collection. Redemonstrated posterior fossa meningioma along the inferior surface of the tentorium, which measures up to 11 mm (series 10, image 17), unchanged. No evidence of significant mass effect on the underlying cerebellum. Vascular: Normal flow voids. Skull and upper cervical spine: Normal marrow signal.  Sinuses/Orbits: Negative.  Status post bilateral lens replacements. Other: None. IMPRESSION: 1. Two small foci of abnormal signal in the left posterior frontal lobe  on diffusion-weighted imaging with possible ADC correlates, which may represent small subacute infarcts versus T2 shine through. 2. Redemonstrated diffuse cerebral and cerebellar microhemorrhages, in a distribution concerning for cerebral amyloid angiopathy. 3. Unchanged small left posterior fossa meningioma. Electronically Signed   By: Merilyn Baba M.D.   On: 03/10/2021 19:53   ECHOCARDIOGRAM COMPLETE  Result Date: 03/11/2021    ECHOCARDIOGRAM REPORT   Patient Name:   James Henderson Date of Exam: 03/11/2021 Medical Rec #:  [id number]      Height:       66.0 in Accession #:    [id number]     Weight:       166.2 lb Date of Birth:  08/29/1950       BSA:          1.849 m Patient Age:    6 years       BP:           152/97 mmHg Patient Gender: M              HR:           84 bpm. Exam Location:  Inpatient Procedure: 2D Echo, Cardiac Doppler and Color Doppler Indications:    STROKE  History:        Patient has prior history of Echocardiogram examinations, most                 recent 10/22/2017. Stroke; Risk Factors:Diabetes and Hypertension.                 HLD.  Sonographer:    Beryle Beams Referring Phys: [id number] [place] IMPRESSIONS  1. Hyperdynamic LV with possible small mid cavitary gradient no SAM . Left ventricular ejection fraction, by estimation, is 65 to 70%. The left ventricle has normal function. The left ventricle has no regional wall motion abnormalities. There is mild left ventricular hypertrophy. Left ventricular diastolic parameters are consistent with Grade I diastolic dysfunction (impaired relaxation).  2. Right ventricular systolic function is normal. The right ventricular size is normal.  3. The mitral valve is abnormal. Trivial mitral valve regurgitation. No evidence of mitral stenosis.  4. The aortic valve is tricuspid. There is moderate calcification of the aortic valve. There is moderate thickening of the aortic valve. Aortic valve regurgitation is not visualized. Aortic valve  sclerosis/calcification is present, without any evidence of aortic stenosis.  5. The inferior vena cava is normal in size with greater than 50% respiratory variability, suggesting right atrial pressure of 3 mmHg. FINDINGS  Left Ventricle: Hyperdynamic LV with possible small mid cavitary gradient no SAM. Left ventricular ejection fraction, by estimation, is 65 to 70%. The left ventricle has normal function. The left ventricle has no regional wall motion abnormalities. The left ventricular internal cavity size was normal in size. There is mild left ventricular hypertrophy. Left ventricular diastolic parameters are consistent with Grade I diastolic dysfunction (impaired relaxation). Right Ventricle: The right ventricular size is normal. No increase in right ventricular wall thickness. Right ventricular systolic function is normal. Left Atrium: Left atrial size was normal in size. Right Atrium: Right atrial size was normal in size. Pericardium: There is no evidence of pericardial effusion. Mitral Valve: The mitral valve is abnormal. There is moderate thickening of the mitral valve leaflet(s). There is mild calcification of the mitral valve leaflet(s). Mild mitral annular  calcification. Trivial mitral valve regurgitation. No evidence of mitral valve stenosis. Tricuspid Valve: The tricuspid valve is normal in structure. Tricuspid valve regurgitation is mild . No evidence of tricuspid stenosis. Aortic Valve: The aortic valve is tricuspid. There is moderate calcification of the aortic valve. There is moderate thickening of the aortic valve. Aortic valve regurgitation is not visualized. Aortic valve sclerosis/calcification is present, without any  evidence of aortic stenosis. Aortic valve mean gradient measures 7.0 mmHg. Aortic valve peak gradient measures 12.2 mmHg. Aortic valve area, by VTI measures 2.50 cm. Pulmonic Valve: The pulmonic valve was normal in structure. Pulmonic valve regurgitation is not visualized. No  evidence of pulmonic stenosis. Aorta: The aortic root is normal in size and structure. Venous: The inferior vena cava is normal in size with greater than 50% respiratory variability, suggesting right atrial pressure of 3 mmHg. IAS/Shunts: No atrial level shunt detected by color flow Doppler.  LEFT VENTRICLE PLAX 2D LVIDd:         4.00 cm LVIDs:         2.30 cm LV PW:         0.90 cm LV IVS:        1.00 cm LVOT diam:     1.90 cm LV SV:         85 LV SV Index:   46 LVOT Area:     2.84 cm  LV Volumes (MOD) LV vol d, MOD A2C: 35.0 ml LV vol d, MOD A4C: 53.6 ml LV vol s, MOD A2C: 9.1 ml LV vol s, MOD A4C: 19.5 ml LV SV MOD A2C:     25.9 ml LV SV MOD A4C:     53.6 ml LV SV MOD BP:      31.4 ml RIGHT VENTRICLE             IVC RV S prime:     16.50 cm/s  IVC diam: 1.50 cm TAPSE (M-mode): 1.8 cm LEFT ATRIUM             Index        RIGHT ATRIUM          Index LA diam:        2.60 cm 1.41 cm/m   RA Area:     8.84 cm LA Vol (A2C):   27.1 ml 14.66 ml/m  RA Volume:   17.40 ml 9.41 ml/m LA Vol (A4C):   42.9 ml 23.21 ml/m LA Biplane Vol: 34.1 ml 18.45 ml/m  AORTIC VALVE                     PULMONIC VALVE AV Area (Vmax):    2.11 cm      PV Vmax:       0.59 m/s AV Area (Vmean):   2.07 cm      PV Vmean:      40.000 cm/s AV Area (VTI):     2.50 cm      PV VTI:        0.120 m AV Vmax:           175.00 cm/s   PV Peak grad:  1.4 mmHg AV Vmean:          118.500 cm/s  PV Mean grad:  1.0 mmHg AV VTI:            0.342 m AV Peak Grad:      12.2 mmHg AV Mean Grad:      7.0 mmHg LVOT Vmax:  130.00 cm/s LVOT Vmean:        86.700 cm/s LVOT VTI:          0.301 m LVOT/AV VTI ratio: 0.88  AORTA Ao Root diam: 2.90 cm Ao Asc diam:  2.80 cm MITRAL VALVE MV Area (PHT): 1.97 cm     SHUNTS MV Decel Time: 385 msec     Systemic VTI:  0.30 m MV E velocity: 86.30 cm/s   Systemic Diam: 1.90 cm MV A velocity: 126.00 cm/s MV E/A ratio:  0.68 Jenkins Rouge MD Electronically signed by Jenkins Rouge MD Signature Date/Time: 03/11/2021/1:43:07 PM     Final      Subjective: Feels well, no new complaints.   Discharge Exam: Vitals:   03/12/21 1138 03/12/21 1537  BP: (!) 146/77 129/76  Pulse: 87 83  Resp: (!) 24 (!) 21  Temp: 98.4 F (36.9 C) 98.2 F (36.8 C)  SpO2: 100% 100%   General: Pt is alert, awake, not in acute distress Cardiovascular: RRR, S1/S2 +, no rubs, no gallops Respiratory: CTA bilaterally, no wheezing, no rhonchi Abdominal: Soft, NT, ND, bowel sounds + Extremities: No edema, no cyanosis Neuro: Follows commands with psychomotor slowing, mild-moderate/stable dysarthria, limited recall, slow/mild dysmetria on left FTN. No tremor or new focal deficits.  Labs: BNP (last 3 results) No results for input(s): BNP in the last 8760 hours. Basic Metabolic Panel: Recent Labs  Lab 03/10/21 1327 03/12/21 0208  NA 140 139  K 3.8 3.9  CL 110 107  CO2 23 24  GLUCOSE 146* 146*  BUN 12 14  CREATININE 1.36* 1.36*  CALCIUM 8.9 8.7*   Liver Function Tests: Recent Labs  Lab 03/10/21 1327  AST 18  ALT 14  ALKPHOS 106  BILITOT 0.9  PROT 6.5  ALBUMIN 3.6   No results for input(s): LIPASE, AMYLASE in the last 168 hours. No results for input(s): AMMONIA in the last 168 hours. CBC: Recent Labs  Lab 03/10/21 1327 03/12/21 0208  WBC 4.2 4.8  NEUTROABS 3.0  --   HGB 11.8* 11.6*  HCT 36.3* 34.6*  MCV 95.5 92.8  PLT 206 203   Cardiac Enzymes: No results for input(s): CKTOTAL, CKMB, CKMBINDEX, TROPONINI in the last 168 hours. BNP: Invalid input(s): POCBNP CBG: Recent Labs  Lab 03/11/21 1646 03/11/21 2058 03/12/21 0624 03/12/21 1140 03/12/21 1540  GLUCAP 91 175* 143* 203* 124*   D-Dimer No results for input(s): DDIMER in the last 72 hours. Hgb A1c Recent Labs    03/10/21 1700  HGBA1C 6.4*   Lipid Profile Recent Labs    03/11/21 0127  CHOL 95  HDL 37*  LDLCALC 45  TRIG 64  CHOLHDL 2.6   Thyroid function studies No results for input(s): TSH, T4TOTAL, T3FREE, THYROIDAB in the last 72  hours.  Invalid input(s): FREET3 Anemia work up No results for input(s): VITAMINB12, FOLATE, FERRITIN, TIBC, IRON, RETICCTPCT in the last 72 hours. Urinalysis    Component Value Date/Time   COLORURINE YELLOW 09/24/2020 2315   APPEARANCEUR CLEAR 09/24/2020 2315   LABSPEC 1.017 09/24/2020 2315   PHURINE 5.0 09/24/2020 2315   GLUCOSEU NEGATIVE 09/24/2020 2315   HGBUR NEGATIVE 09/24/2020 2315   BILIRUBINUR NEGATIVE 09/24/2020 2315   KETONESUR NEGATIVE 09/24/2020 2315   PROTEINUR 30 (A) 09/24/2020 2315   UROBILINOGEN 0.2 12/07/2013 0032   NITRITE NEGATIVE 09/24/2020 2315   LEUKOCYTESUR NEGATIVE 09/24/2020 2315    Microbiology Recent Results (from the past 240 hour(s))  Resp Panel by RT-PCR (Flu A&B, Covid) Nasopharyngeal Swab  Status: None   Collection Time: 03/10/21  4:29 PM   Specimen: Nasopharyngeal Swab; Nasopharyngeal(NP) swabs in vial transport medium  Result Value Ref Range Status   SARS Coronavirus 2 by RT PCR NEGATIVE NEGATIVE Final    Comment: (NOTE) SARS-CoV-2 target nucleic acids are NOT DETECTED.  The SARS-CoV-2 RNA is generally detectable in upper respiratory specimens during the acute phase of infection. The lowest concentration of SARS-CoV-2 viral copies this assay can detect is 138 copies/mL. A negative result does not preclude SARS-Cov-2 infection and should not be used as the sole basis for treatment or other patient management decisions. A negative result may occur with  improper specimen collection/handling, submission of specimen other than nasopharyngeal swab, presence of viral mutation(s) within the areas targeted by this assay, and inadequate number of viral copies(<138 copies/mL). A negative result must be combined with clinical observations, patient history, and epidemiological information. The expected result is Negative.  Fact Sheet for Patients:  EntrepreneurPulse.com.au  Fact Sheet for Healthcare Providers:   IncredibleEmployment.be  This test is no t yet approved or cleared by the [place] FDA and  has been authorized for detection and/or diagnosis of SARS-CoV-2 by FDA under an Emergency Use Authorization (EUA). This EUA will remain  in effect (meaning this test can be used) for the duration of the COVID-19 declaration under Section 564(b)(1) of the Act, 21 U.S.C.section 360bbb-3(b)(1), unless the authorization is terminated  or revoked sooner.       Influenza A by PCR NEGATIVE NEGATIVE Final   Influenza B by PCR NEGATIVE NEGATIVE Final    Comment: (NOTE) The Xpert Xpress SARS-CoV-2/FLU/RSV plus assay is intended as an aid in the diagnosis of influenza from Nasopharyngeal swab specimens and should not be used as a sole basis for treatment. Nasal washings and aspirates are unacceptable for Xpert Xpress SARS-CoV-2/FLU/RSV testing.  Fact Sheet for Patients: EntrepreneurPulse.com.au  Fact Sheet for Healthcare Providers: IncredibleEmployment.be  This test is not yet approved or cleared by the [place] FDA and has been authorized for detection and/or diagnosis of SARS-CoV-2 by FDA under an Emergency Use Authorization (EUA). This EUA will remain in effect (meaning this test can be used) for the duration of the COVID-19 declaration under Section 564(b)(1) of the Act, 21 U.S.C. section 360bbb-3(b)(1), unless the authorization is terminated or revoked.  Performed at [facility], [place] [address] [place], [place] [address]     Time coordinating discharge: Approximately 40 minutes  Patrecia Pour, MD  [facility] 03/12/2021, 4:21 PM

## 2021-03-12 NOTE — Progress Notes (Addendum)
STROKE TEAM PROGRESS NOTE   SUBJECTIVE (INTERVAL HISTORY) No family is at the bedside. Pt lying in bed, no complains. No acute event overnight. Neuro stable.  CTA head and neck showed severe left P2 stenosis.  MRI limited repeat no acute finding except small left frontal white matter subacute infarct.   OBJECTIVE Temp:  [98.1 F (36.7 C)-98.7 F (37.1 C)] 98.2 F (36.8 C) (01/22 1537) Pulse Rate:  [67-87] 83 (01/22 1537) Cardiac Rhythm: Normal sinus rhythm (01/22 0822) Resp:  [15-24] 21 (01/22 1537) BP: (127-156)/(65-81) 129/76 (01/22 1537) SpO2:  [99 %-100 %] 100 % (01/22 1537)  Recent Labs  Lab 03/11/21 1646 03/11/21 2058 03/12/21 0624 03/12/21 1140 03/12/21 1540  GLUCAP 91 175* 143* 203* 124*   Recent Labs  Lab 03/10/21 1327 03/12/21 0208  NA 140 139  K 3.8 3.9  CL 110 107  CO2 23 24  GLUCOSE 146* 146*  BUN 12 14  CREATININE 1.36* 1.36*  CALCIUM 8.9 8.7*   Recent Labs  Lab 03/10/21 1327  AST 18  ALT 14  ALKPHOS 106  BILITOT 0.9  PROT 6.5  ALBUMIN 3.6   Recent Labs  Lab 03/10/21 1327 03/12/21 0208  WBC 4.2 4.8  NEUTROABS 3.0  --   HGB 11.8* 11.6*  HCT 36.3* 34.6*  MCV 95.5 92.8  PLT 206 203   No results for input(s): CKTOTAL, CKMB, CKMBINDEX, TROPONINI in the last 168 hours. Recent Labs    03/10/21 1607  LABPROT 14.6  INR 1.1   No results for input(s): COLORURINE, LABSPEC, PHURINE, GLUCOSEU, HGBUR, BILIRUBINUR, KETONESUR, PROTEINUR, UROBILINOGEN, NITRITE, LEUKOCYTESUR in the last 72 hours.  Invalid input(s): APPERANCEUR     Component Value Date/Time   CHOL 95 03/11/2021 0127   TRIG 64 03/11/2021 0127   HDL 37 (L) 03/11/2021 0127   CHOLHDL 2.6 03/11/2021 0127   VLDL 13 03/11/2021 0127   LDLCALC 45 03/11/2021 0127   Lab Results  Component Value Date   HGBA1C 6.4 (H) 03/10/2021      Component Value Date/Time   LABOPIA NONE DETECTED 10/21/2017 1036   COCAINSCRNUR NONE DETECTED 10/21/2017 1036   LABBENZ NONE DETECTED 10/21/2017 1036    AMPHETMU NONE DETECTED 10/21/2017 1036   THCU NONE DETECTED 10/21/2017 1036   LABBARB NONE DETECTED 10/21/2017 1036    No results for input(s): ETH in the last 168 hours.  I have personally reviewed the radiological images below and agree with the radiology interpretations.  CT ANGIO HEAD NECK W WO CM  Result Date: 03/11/2021 CLINICAL DATA:  Follow-up examination for stroke. EXAM: CT ANGIOGRAPHY HEAD AND NECK TECHNIQUE: Multidetector CT imaging of the head and neck was performed using the standard protocol during bolus administration of intravenous contrast. Multiplanar CT image reconstructions and MIPs were obtained to evaluate the vascular anatomy. Carotid stenosis measurements (when applicable) are obtained utilizing NASCET criteria, using the distal internal carotid diameter as the denominator. RADIATION DOSE REDUCTION: This exam was performed according to the departmental dose-optimization program which includes automated exposure control, adjustment of the mA and/or kV according to patient size and/or use of iterative reconstruction technique. CONTRAST:  47mL OMNIPAQUE IOHEXOL 350 MG/ML SOLN COMPARISON:  MRI and CT from 03/10/2021. FINDINGS: CT HEAD FINDINGS Brain: Age-related cerebral atrophy with advanced chronic microvascular ischemic disease. No acute intracranial hemorrhage. No visible acute large vessel territory infarct by CT. Calcified meningioma along the left tentorium again noted. No midline shift or mass effect. No hydrocephalus or extra-axial fluid collection. Vascular: No hyperdense vessel. Skull:  Scalp soft tissues and calvarium within normal limits. Sinuses: Clear. Orbits: Unremarkable. Review of the MIP images confirms the above findings CTA NECK FINDINGS Aortic arch: Visualized aortic arch normal caliber with normal 3 vessel morphology. Mild atheromatous change about the arch itself. No stenosis about the origin of the great vessels. Right carotid system: Right common and  internal carotid arteries widely patent without stenosis, dissection or occlusion. Left carotid system: Left common and internal carotid arteries widely patent without stenosis, dissection or occlusion. Vertebral arteries: Both vertebral arteries arise from the subclavian arteries. No proximal subclavian artery stenosis. Both vertebral arteries widely patent without stenosis, dissection or occlusion. Skeleton: No discrete or worrisome osseous lesions. Moderately advanced multilevel spondylosis noted throughout the visualized spine. Poor dentition noted. Other neck: No other acute soft tissue abnormality within the neck. Upper chest: Visualized upper chest demonstrates no acute finding. Review of the MIP images confirms the above findings CTA HEAD FINDINGS Anterior circulation: Petrous segments patent bilaterally. Mild atheromatous change within the carotid siphons without hemodynamically significant stenosis. A1 segments patent bilaterally. Left A1 hypoplastic. Normal anterior communicating artery complex. Anterior cerebral arteries patent without high-grade stenosis. No M1 stenosis or occlusion. Normal MCA bifurcations. Moderate distal small vessel atheromatous irregularity. No proximal MCA branch occlusion. Posterior circulation: Both V4 segments patent to the vertebrobasilar junction without stenosis. Both PICA origins patent and normal. Basilar widely patent to its distal aspect without stenosis. Superior cerebellar arteries patent bilaterally. Both PCAs primarily supplied via the basilar. Right PCA mildly irregular but patent to its distal aspect without flow-limiting stenosis. Focal severe proximal left P2 stenosis present (series 11, image 100). Left PCA otherwise remains patent to its distal aspect. Venous sinuses: Patent allowing for timing the contrast bolus. Anatomic variants: Hypoplastic left A1 segment.  No aneurysm. Review of the MIP images confirms the above findings IMPRESSION: CT HEAD IMPRESSION: 1.  Stable head CT.  No new acute intracranial abnormality. 2. Age-related cerebral atrophy with advanced chronic microvascular ischemic disease. 3. 11 mm left tentorial meningioma without mass effect. CTA HEAD AND NECK IMPRESSION: 1. Negative CTA for large vessel occlusion. 2. Severe proximal left P2 stenosis. 3. Mild-to-moderate atheromatous irregularity elsewhere about the major arterial vasculature of the head and neck. No other hemodynamically significant or correctable stenosis. Electronically Signed   By: Jeannine Boga M.D.   On: 03/11/2021 19:38   CT Head Wo Contrast  Result Date: 03/10/2021 CLINICAL DATA:  Neuro deficit, acute, stroke suspected. Weakness. Gait problems. EXAM: CT HEAD WITHOUT CONTRAST TECHNIQUE: Contiguous axial images were obtained from the base of the skull through the vertex without intravenous contrast. RADIATION DOSE REDUCTION: This exam was performed according to the departmental dose-optimization program which includes automated exposure control, adjustment of the mA and/or kV according to patient size and/or use of iterative reconstruction technique. COMPARISON:  Brain MRI 09/24/2020. MRA head 09/24/2020. Head CT 09/24/2020. FINDINGS: Brain: Moderate generalized cerebral atrophy. Comparatively mild cerebellar atrophy. 11 mm partially calcified meningioma along the undersurface of the left tentorium, unchanged in size as compared to the head CT of 09/24/2020 (for instance as seen on series 3, image 13) (series 5, image 52). As before, there is contact upon the superior aspect of the left cerebellar hemisphere without significant mass effect. Advanced patchy and confluent hypoattenuation within the cerebral white matter, nonspecific but compatible with chronic small vessel ischemic disease. Redemonstrated chronic lacunar infarcts within the bilateral thalami. Partially empty sella turcica. There is no acute intracranial hemorrhage. No acute  demarcated cortical infarct. No  extra-axial fluid collection. No midline shift. Vascular: No hyperdense vessel.  Atherosclerotic calcifications. Skull: Normal. Negative for fracture or focal lesion. Sinuses/Orbits: Visualized orbits show no acute finding. Trace scattered paranasal sinus mucosal thickening at the imaged levels. IMPRESSION: No evidence of acute intracranial abnormality. Stable non-contrast CT appearance of the brain as compared to 09/24/2020. 11 mm partially calcified meningioma along the undersurface of the left tentorium. Advanced chronic small vessel ischemic changes within the cerebral white matter. Chronic lacunar infarcts within the bilateral thalami. Moderate generalized cerebral atrophy. Comparatively mild cerebellar atrophy. Electronically Signed   By: Kellie Simmering D.O.   On: 03/10/2021 13:47   MR BRAIN WO CONTRAST  Result Date: 03/11/2021 CLINICAL DATA:  Follow-up stroke EXAM: MRI HEAD WITHOUT CONTRAST TECHNIQUE: Multiplanar, multiecho pulse sequences of the brain and surrounding structures were obtained without intravenous contrast. COMPARISON:  CT head 03/11/2021.  MRI head 03/10/2021 FINDINGS: Study limited to diffusion weighted imaging and SWI per Dr. Erlinda Hong. Diffusion-weighted imaging again mildly positive in the left posterior frontal white matter unchanged. This may represent a recent infarct. No new area of restricted diffusion identified compared to the recent study. Severe diffuse chronic microhemorrhage. Greater than 50 foci of microhemorrhage are present in both cerebral hemispheres, brainstem and basal ganglia, and throughout both cerebral hemispheres. This is unchanged from the prior study IMPRESSION: No change in the small area of restricted diffusion left frontal white matter likely due to recent infarct. Extensive chronic microhemorrhage throughout the brain. This may be due to cerebral amyloid. Electronically Signed   By: Franchot Gallo M.D.   On: 03/11/2021 19:58   MR Brain W and Wo Contrast  Result  Date: 03/10/2021 CLINICAL DATA:  Dizziness, cardiac or vascular cause suspected, concern for stroke EXAM: MRI HEAD WITHOUT AND WITH CONTRAST TECHNIQUE: Multiplanar, multiecho pulse sequences of the brain and surrounding structures were obtained without and with intravenous contrast. CONTRAST:  7.54mL GADAVIST GADOBUTROL 1 MMOL/ML IV SOLN COMPARISON:  09/24/2020 MRI head, correlation is also made with 03/10/2021 CT head. FINDINGS: Brain: Two small foci of mildly increased signal on diffusion-weighted imaging with possible ADC correlates in the left posterior frontal lobe (series 2, image 40 and series 250, image 39; series 2, image 38 and series 250, image 38), which may represent very small subacute infarcts versus T2 shine through. No acute hemorrhage, mass, mass effect, or midline shift. No abnormal parenchymal enhancement. Confluent T2 hyperintense signal in the periventricular white matter, likely the sequela of severe lacunar infarcts in the bilateral cerebellar hemispheres and corona radiata chronic small vessel ischemic disease. Redemonstrated diffuse foci of hemosiderin deposition throughout the bilateral cerebral and cerebellar hemispheres. Generalized cerebral volume loss, without lobar predominance. Degree of atrophy is advanced for age. No hydrocephalus or extra-axial collection. Redemonstrated posterior fossa meningioma along the inferior surface of the tentorium, which measures up to 11 mm (series 10, image 17), unchanged. No evidence of significant mass effect on the underlying cerebellum. Vascular: Normal flow voids. Skull and upper cervical spine: Normal marrow signal. Sinuses/Orbits: Negative.  Status post bilateral lens replacements. Other: None. IMPRESSION: 1. Two small foci of abnormal signal in the left posterior frontal lobe on diffusion-weighted imaging with possible ADC correlates, which may represent small subacute infarcts versus T2 shine through. 2. Redemonstrated diffuse cerebral and  cerebellar microhemorrhages, in a distribution concerning for cerebral amyloid angiopathy. 3. Unchanged small left posterior fossa meningioma. Electronically Signed   By: Merilyn Baba M.D.   On: 03/10/2021 19:53   ECHOCARDIOGRAM COMPLETE  Result Date:  03/11/2021    ECHOCARDIOGRAM REPORT   Patient Name:   KAIROS PANETTA Date of Exam: 03/11/2021 Medical Rec #:  [id number]      Height:       66.0 in Accession #:    [id number]     Weight:       166.2 lb Date of Birth:  [birth year]       BSA:          1.849 m Patient Age:    71 years       BP:           152/97 mmHg Patient Gender: M              HR:           84 bpm. Exam Location:  Inpatient Procedure: 2D Echo, Cardiac Doppler and Color Doppler Indications:    STROKE  History:        Patient has prior history of Echocardiogram examinations, most                 recent 10/22/2017. Stroke; Risk Factors:Diabetes and Hypertension.                 HLD.  Sonographer:    Beryle Beams Referring Phys: [id number] [place] IMPRESSIONS  1. Hyperdynamic LV with possible small mid cavitary gradient no SAM . Left ventricular ejection fraction, by estimation, is 65 to 70%. The left ventricle has normal function. The left ventricle has no regional wall motion abnormalities. There is mild left ventricular hypertrophy. Left ventricular diastolic parameters are consistent with Grade I diastolic dysfunction (impaired relaxation).  2. Right ventricular systolic function is normal. The right ventricular size is normal.  3. The mitral valve is abnormal. Trivial mitral valve regurgitation. No evidence of mitral stenosis.  4. The aortic valve is tricuspid. There is moderate calcification of the aortic valve. There is moderate thickening of the aortic valve. Aortic valve regurgitation is not visualized. Aortic valve sclerosis/calcification is present, without any evidence of aortic stenosis.  5. The inferior vena cava is normal in size with greater than 50% respiratory variability, suggesting right  atrial pressure of 3 mmHg. FINDINGS  Left Ventricle: Hyperdynamic LV with possible small mid cavitary gradient no SAM. Left ventricular ejection fraction, by estimation, is 65 to 70%. The left ventricle has normal function. The left ventricle has no regional wall motion abnormalities. The left ventricular internal cavity size was normal in size. There is mild left ventricular hypertrophy. Left ventricular diastolic parameters are consistent with Grade I diastolic dysfunction (impaired relaxation). Right Ventricle: The right ventricular size is normal. No increase in right ventricular wall thickness. Right ventricular systolic function is normal. Left Atrium: Left atrial size was normal in size. Right Atrium: Right atrial size was normal in size. Pericardium: There is no evidence of pericardial effusion. Mitral Valve: The mitral valve is abnormal. There is moderate thickening of the mitral valve leaflet(s). There is mild calcification of the mitral valve leaflet(s). Mild mitral annular calcification. Trivial mitral valve regurgitation. No evidence of mitral valve stenosis. Tricuspid Valve: The tricuspid valve is normal in structure. Tricuspid valve regurgitation is mild . No evidence of tricuspid stenosis. Aortic Valve: The aortic valve is tricuspid. There is moderate calcification of the aortic valve. There is moderate thickening of the aortic valve. Aortic valve regurgitation is not visualized. Aortic valve sclerosis/calcification is present, without any  evidence of aortic stenosis. Aortic valve mean gradient measures 7.0 mmHg. Aortic valve peak gradient measures 12.2  mmHg. Aortic valve area, by VTI measures 2.50 cm. Pulmonic Valve: The pulmonic valve was normal in structure. Pulmonic valve regurgitation is not visualized. No evidence of pulmonic stenosis. Aorta: The aortic root is normal in size and structure. Venous: The inferior vena cava is normal in size with greater than 50% respiratory variability,  suggesting right atrial pressure of 3 mmHg. IAS/Shunts: No atrial level shunt detected by color flow Doppler.  LEFT VENTRICLE PLAX 2D LVIDd:         4.00 cm LVIDs:         2.30 cm LV PW:         0.90 cm LV IVS:        1.00 cm LVOT diam:     1.90 cm LV SV:         85 LV SV Index:   46 LVOT Area:     2.84 cm  LV Volumes (MOD) LV vol d, MOD A2C: 35.0 ml LV vol d, MOD A4C: 53.6 ml LV vol s, MOD A2C: 9.1 ml LV vol s, MOD A4C: 19.5 ml LV SV MOD A2C:     25.9 ml LV SV MOD A4C:     53.6 ml LV SV MOD BP:      31.4 ml RIGHT VENTRICLE             IVC RV S prime:     16.50 cm/s  IVC diam: 1.50 cm TAPSE (M-mode): 1.8 cm LEFT ATRIUM             Index        RIGHT ATRIUM          Index LA diam:        2.60 cm 1.41 cm/m   RA Area:     8.84 cm LA Vol (A2C):   27.1 ml 14.66 ml/m  RA Volume:   17.40 ml 9.41 ml/m LA Vol (A4C):   42.9 ml 23.21 ml/m LA Biplane Vol: 34.1 ml 18.45 ml/m  AORTIC VALVE                     PULMONIC VALVE AV Area (Vmax):    2.11 cm      PV Vmax:       0.59 m/s AV Area (Vmean):   2.07 cm      PV Vmean:      40.000 cm/s AV Area (VTI):     2.50 cm      PV VTI:        0.120 m AV Vmax:           175.00 cm/s   PV Peak grad:  1.4 mmHg AV Vmean:          118.500 cm/s  PV Mean grad:  1.0 mmHg AV VTI:            0.342 m AV Peak Grad:      12.2 mmHg AV Mean Grad:      7.0 mmHg LVOT Vmax:         130.00 cm/s LVOT Vmean:        86.700 cm/s LVOT VTI:          0.301 m LVOT/AV VTI ratio: 0.88  AORTA Ao Root diam: 2.90 cm Ao Asc diam:  2.80 cm MITRAL VALVE MV Area (PHT): 1.97 cm     SHUNTS MV Decel Time: 385 msec     Systemic VTI:  0.30 m MV E velocity: 86.30 cm/s   Systemic  Diam: 1.90 cm MV A velocity: 126.00 cm/s MV E/A ratio:  0.68 Jenkins Rouge MD Electronically signed by Jenkins Rouge MD Signature Date/Time: 03/11/2021/1:43:07 PM    Final      PHYSICAL EXAM  Temp:  [98.1 F (36.7 C)-98.7 F (37.1 C)] 98.2 F (36.8 C) (01/22 1537) Pulse Rate:  [67-87] 83 (01/22 1537) Resp:  [15-24] 21 (01/22 1537) BP:  (127-156)/(65-81) 129/76 (01/22 1537) SpO2:  [99 %-100 %] 100 % (01/22 1537)  General - Well nourished, well developed, in no apparent distress.  Ophthalmologic - fundi not visualized due to noncooperation.  Cardiovascular - Regular rhythm and rate.  Mental Status -  Level of arousal and orientation to time, place, and person were intact. No aphasia, following simple commands, but bradyphonia, psychomotor slowing, mild to moderate dysarthria. Naming 1/4, able to repeat simple sentence but not complicated sentences  Cranial Nerves II - XII - II - Visual field intact OU. III, IV, VI - Extraocular movements intact. V - Facial sensation intact bilaterally. VII - Facial movement intact bilaterally. VIII - Hearing & vestibular intact bilaterally. X - Palate elevates symmetrically. XI - Chin turning & shoulder shrug intact bilaterally. XII - Tongue protrusion intact.  Motor Strength - The patients strength was normal in all extremities and pronator drift was absent.  Bulk was normal and fasciculations were absent.   Motor Tone - Muscle tone was assessed at the neck and appendages and was normal.  Reflexes - The patients reflexes were symmetrical in all extremities and he had no pathological reflexes.  Sensory - Light touch, temperature/pinprick were assessed and were symmetrical.    Coordination - The patient had grossly normal movements in the right hand and b/l feet with no ataxia or dysmetria, however slow and mild dysmetria on the left FTN.  Tremor was absent.  Gait and Station - deferred.   ASSESSMENT/PLAN Mr. Dontavious Emily is a 71 y.o. male with history of CKD, DM, HTN, HLD, stroke, CAA, left posterior fossa meningioma admitted for unsteady gait. No tPA given due to OOW.    Left frontal WM subacute infarct, likely small vessel disease due to atherosclerosis CT no acute finding MRI and repeat showed Subacute left frontal WM infarct CTA head and neck severe proximal left P2  stenosis 2D Echo  EF 65-70% LDL 45 HgbA1c 6.4 lovenox for VTE prophylaxis clopidogrel 75 mg daily prior to admission, now on clopidogrel 75 mg daily. No DAPT given likely CAA on MRI Ongoing aggressive stroke risk factor management Therapy recommendations:  HH PT/OT Disposition:  pending  Hx of stroke admitted on 12/06/13 for small midbrain infarct. MRA also showed widespread microbleeds, likely concerning for CAA. He was put on ASA, did not escalate antiplatelet due to extensive microbleeds. Admitted on 10/21/2017 for difficulty walking, nausea vomiting, headache and generalized weakness.  CT head and neck showed diffuse intracranial atherosclerosis.  MRI showed left brachium pontis acute infarct.  EF 55 to 60%, LDL 82 and A1c 6.2.  Discharged on Plavix given extensive microhemorrhages. Admitted on 09/24/2020 for worsening weakness and confusion in the setting of AKI.  MRI no acute infarct.  EEG no seizure. Follow with [name] at [facility], on Plavix, statin.  I also considered Aricept due to mixed vascular and Alzheimer's dementia.  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 MRI showed extensive microhemorrhages more concerning for CAA On Plavix, avoid DAPT Strict BP control, goal less than 140  Diabetes HgbA1c 6.4 goal < 7.0 Controlled CBG monitoring SSI  DM education and close PCP follow up  Hypertension Home amlodipine 5 and cozaar 100 Stable on the high end Resume home amlodipine and cozaar BP goal < 140 due to CAA  Hyperlipidemia Home meds:  zetia and crestor 40  LDL 45, goal < 70 Now on zetia and crestor 40 Continue statin at discharge  Other Stroke Risk Factors Advanced age  Other Active Problems CKD, Cre 1.36 Left posterior fossa meningioma mixed vascular and Alzheimer's dementia followed with Dr. Sherre Poot day # 0  Neurology will sign off. Please call with questions. Pt will follow up with [name] at [facility] in about 4 weeks. Thanks for the  consult.   Rosalin Hawking, MD PhD Stroke Neurology 03/12/2021 3:46 PM    To contact Stroke Continuity provider, please refer to [email or web address] After hours, contact General Neurology

## 2021-03-12 NOTE — TOC Transition Note (Signed)
Transition of Care [facility]) - CM/SW Discharge Note   Patient Details  Name: James Henderson MRN: [id number] Date of Birth: [birth year]  Transition [facility] Care [facility] CM/SW Contact:  Carles Collet, RN Phone Number: 03/12/2021, 5:03 PM   Clinical Narrative:    Patient to DC to home. Unable to reach wife, no answer x 2 so referral made to [facility] just prior to office closing at 5:00pm. Reached wife after 5 and explained [place] services will start Tuesday through [facility].  Patient has RW at home.     Final next level of care: [place] Barriers to Discharge: No Barriers Identified   Patient Goals and CMS Choice Patient states their goals for this hospitalization and ongoing recovery are:: to go home CMS Medicare.gov Compare Post Acute Care list provided to:: Other (Comment Required) Choice offered to / list presented to : Spouse  Discharge Placement                       Discharge Plan and Services                          HH Arranged: PT, OT [facility] Agency: [place] Date [place]: 03/12/21 Time [place]: 1703 Representative spoke with at [place]: Amy  Social Determinants of Health ([place]) Interventions     Readmission Risk Interventions No flowsheet data found.

## 2021-03-13 NOTE — Progress Notes (Signed)
NEUROLOGY FOLLOW UP OFFICE NOTE  James Henderson [id number]  Assessment/Plan:   Left frontal white matter subacute infarct secondary to small vessel disease Mixed Alzheimer's and vascular dementia Possible cerebral amyloid angiopathy Hypertension Type 2 diabetes mellitus Hyperlipidemia  In addition to donepezil, start memantine, titrating to 10mg  twice daily Continue secondary stroke prevention as managed by PCP: Plavix 75mg  daily Statin.  LDL goal less than 70 Normotensive blood pressure - advised to contact PCP regarding elevated blood pressure Glycemic control.  Hgb A1c goal less than 7 Follow up with James Butters, PA-C in 6 months.   Subjective:  James Henderson is a 71 year old right-handed man with dementia, hypertension, hyperlipidemia, type 2 diabetes, cerebral meningioma and history of brainstem stroke who presents for recent hospitalization for stroke.  He is accompanied by his wife who supplements history.  CT/MRI brain and CTA of head and neck from recent hospital admission personally reviewed.   UPDATE: Current medications:  Aricept 10mg  QHS, Plavix 75mg , Crestor 40mg , glipizide, losartan   Admitted to [facility] on 03/10/2021 for unsteady gait.  CT head show no acute findings but MRI of brain showed subacute infarct in the frontal white matter as well as extensive chronic microhemorrhages concerning for cerebral amyloid angiopathy.  CTA of head and neck showed severe proximal left P2 stenosis.  2D echo showed EF 65-70% with no cardiac source of emboli.  LDL was 45 and Hgb A1c was 6.4.  Due to possible CAA, he was continued to Plavix instead of DAPT and maintained on statin.  Amlodipine was increased from 5 to 10mg  daily.  Still off balance but not as severe.  Using walker.  Starting home physical therapy.       HISTORY: James Henderson has a history of prior small midbrain infarct in 2015.  He was admitted to [facility] from 10/21/2017 to 10/24/2017 after  presenting with dizziness and ataxia.  CTA of the head and neck revealed diffuse intracranial atherosclerotic disease and mild atherosclerotic disease in both carotid arteries but no significant stenosis or large vessel occlusion.  MRI of the brain showed moderate atrophy with chronic small vessel ischemic changes as well as extensive chronic microhemorrhages as well as left lateral tentorium 8 x 45mm meningioma (stable since 2015) but no acute infarct.  He had a repeat MRI which then demonstrated acute 5 mm infarct in the left brachium pontis.  2D echocardiogram demonstrated ejection fraction 55 to 60% but was indeterminant for evaluation of LV diastolic function.  LDL was 82.  Hemoglobin A1c was 6.2.  He was previously on aspirin 81 mg daily prior to admission.  He was discharged on Plavix 75 mg daily (dual antiplatelet therapy was not initiated due to evidence of microhemorrhages) and Crestor 40 mg daily.     He started having memory problems in 2021 and has gradually gotten worse.  He frequently misplaces objects.  He can no longer pay bills independently.  He forgets to make monthly payments on the mortgage and car.  He forgets to eat.  He ambulates more slowly.  He is now getting disoriented driving on familiar routes.  He has forgotten how to get to his doctor's offices.     MRI of brain with and without contrast on 02/27/2020 showed incidental punctate acute infarct in the left hemispheric white matter as well as advanced chronic small vessel disease and slight parietal predominance atrophy.  B12 and TSH were ordered but appears to never have been performed.  He was admitted to [facility] on 09/24/2020 for confusion and left sided weakness.  Creatine was elevated at 1.6 but otherwise CMP and CBC unremarkable with negative UA, COVID and ammonia. CT head and MRI of brain personally reviewed was negative for acute stroke.  MRA of head and neck personally reviewed showed no hemodynamically  significant stenosis or large vessel occlusion.  EEG showed mild diffuse slowing but no epileptiform discharges or electrographic seizures.  Symptoms felt to be metabolic encephalopathy with recrudescence of previous stroke symptoms due to AKI from dehydration.  He is eating well.  Since hospitalization, he has increased his water intake.  PAST MEDICAL HISTORY: Past Medical History:  Diagnosis Date   Brainstem stroke ([place]) 2015   Cerebral meningioma ([place])    Diabetes mellitus without complication ([place])    Glaucoma    Hyperlipidemia    Hypertension     MEDICATIONS: Current Outpatient Medications on File Prior to Visit  Medication Sig Dispense Refill   acetaminophen (TYLENOL) 500 MG tablet Take 1,000 mg by mouth every 6 (six) hours as needed for headache (pain).     amLODipine (NORVASC) 10 MG tablet Take 1 tablet (10 mg total) by mouth daily. 30 tablet 0   Brinzolamide-Brimonidine 1-0.2 % SUSP Place 1 drop into both eyes 2 (two) times daily.      Cholecalciferol (VITAMIN D-3) 125 MCG (5000 UT) TABS Take 5,000 Units by mouth daily.     clopidogrel (PLAVIX) 75 MG tablet Take 1 tablet (75 mg total) by mouth daily. 30 tablet 0   donepezil (ARICEPT) 10 MG tablet TAKE ONE TABLET BY MOUTH EVERYDAY AT BEDTIME (Patient taking differently: Take 10 mg by mouth at bedtime.) 30 tablet 5   ezetimibe (ZETIA) 10 MG tablet Take 10 mg by mouth daily.     glipiZIDE (GLUCOTROL) 5 MG tablet Take 5 mg by mouth daily.     Inositol Niacinate 500 MG CAPS Take 500 mg by mouth every evening.     losartan (COZAAR) 100 MG tablet Take 100 mg by mouth daily.  1   metFORMIN (GLUCOPHAGE) 1000 MG tablet Take 500-1,000 mg by mouth See admin instructions. Take1000 mg in the morning and 500 mg in the evening  1   Omega-3 Fatty Acids (FISH OIL) 500 MG CAPS Take 500 mg by mouth every evening.     rosuvastatin (CRESTOR) 40 MG tablet Take 1 tablet (40 mg total) by mouth daily at 6 PM. 30 tablet 0   tolterodine (DETROL LA) 4 MG 24  hr capsule Take 4 mg by mouth daily.     Vibegron (GEMTESA) 75 MG TABS Take 75 mg by mouth daily.     No current facility-administered medications on file prior to visit.    ALLERGIES: No Known Allergies  FAMILY HISTORY: Family History  Problem Relation Age of Onset   Stroke Mother    Stroke Brother    Diabetes type II Brother       Objective:  Blood pressure (!) 165/96, pulse (!) 102, height 5\' 5"  (1.651 m), weight 167 lb 9.6 oz (76 kg), SpO2 99 %. General: No acute distress.  Patient appears well-groomed.   Head:  Normocephalic/atraumatic Eyes:  Fundi examined but not visualized Neck: supple, no paraspinal tenderness, full range of motion Heart:  Regular rate and rhythm Lungs:  Clear to auscultation bilaterally Back: No paraspinal tenderness Neurological Exam:  [employer] Mental Exam 01/26/2020 03/14/2021  Weekday Correct 1 1  Current year 1 1  What state  are we in? 0 1  Amount spent 0 0  Amount left 0 0  # of Animals 0 0  5 objects recall 0 0  Number series 0 1  Hour markers 0 0  Time correct 0 0  Placed X in triangle correctly 1 1  Largest Figure 1 1  Name of male 0 0  Date back to work 0 0  Type of work 0 0  State she lived in 0 0  Total score 4 6   Speech fluent and not dysarthric, language intact.  CN II-XII intact. Bulk and tone normal, muscle strength 5-/5 left grip, otherwise 5/5 throughout.  Sensation to light touch intact.  Deep tendon reflexes absent throughout, toes downgoing.  Finger to nose testing intact.  Gait ataxic/unsteady.  Ambulates with walker.  Romberg with sway.   Metta Clines, DO  CC: Marilynne Drivers, PA-C

## 2021-03-14 ENCOUNTER — Ambulatory Visit: Payer: HMO | Admitting: Neurology

## 2021-03-14 ENCOUNTER — Encounter: Payer: Self-pay | Admitting: Neurology

## 2021-03-14 ENCOUNTER — Other Ambulatory Visit: Payer: Self-pay

## 2021-03-14 VITALS — BP 165/96 | HR 102 | Ht 65.0 in | Wt 167.6 lb

## 2021-03-14 DIAGNOSIS — E119 Type 2 diabetes mellitus without complications: Secondary | ICD-10-CM

## 2021-03-14 DIAGNOSIS — I680 Cerebral amyloid angiopathy: Secondary | ICD-10-CM

## 2021-03-14 DIAGNOSIS — G309 Alzheimer's disease, unspecified: Secondary | ICD-10-CM

## 2021-03-14 DIAGNOSIS — F0150 Vascular dementia without behavioral disturbance: Secondary | ICD-10-CM | POA: Diagnosis not present

## 2021-03-14 DIAGNOSIS — I10 Essential (primary) hypertension: Secondary | ICD-10-CM | POA: Diagnosis not present

## 2021-03-14 DIAGNOSIS — I6389 Other cerebral infarction: Secondary | ICD-10-CM | POA: Diagnosis not present

## 2021-03-14 DIAGNOSIS — E785 Hyperlipidemia, unspecified: Secondary | ICD-10-CM

## 2021-03-14 DIAGNOSIS — F0280 Dementia in other diseases classified elsewhere without behavioral disturbance: Secondary | ICD-10-CM | POA: Diagnosis not present

## 2021-03-14 DIAGNOSIS — E854 Organ-limited amyloidosis: Secondary | ICD-10-CM | POA: Diagnosis not present

## 2021-03-14 MED ORDER — MEMANTINE HCL 10 MG PO TABS: ORAL_TABLET | ORAL | 0 refills | Status: DC

## 2021-03-14 NOTE — Patient Instructions (Signed)
Start memantine 10mg  - take 1/2 tablet at bedtime for one week, then 1/2 tablet twice daily for one week, then 1 tablet twice daily Continue donepezil 10mg  at bedtime Continue Plavix 75mg  daily, rosuvastatin 40mg , Zetia, blood pressure and diabetes mellitus Follow up with Sharene Butters, PA-C in 6 months.

## 2021-03-20 DIAGNOSIS — I10 Essential (primary) hypertension: Secondary | ICD-10-CM | POA: Diagnosis not present

## 2021-03-20 DIAGNOSIS — E785 Hyperlipidemia, unspecified: Secondary | ICD-10-CM | POA: Diagnosis not present

## 2021-03-20 DIAGNOSIS — E1122 Type 2 diabetes mellitus with diabetic chronic kidney disease: Secondary | ICD-10-CM | POA: Diagnosis not present

## 2021-03-20 DIAGNOSIS — I129 Hypertensive chronic kidney disease with stage 1 through stage 4 chronic kidney disease, or unspecified chronic kidney disease: Secondary | ICD-10-CM | POA: Diagnosis not present

## 2021-03-20 DIAGNOSIS — I680 Cerebral amyloid angiopathy: Secondary | ICD-10-CM | POA: Diagnosis not present

## 2021-03-20 DIAGNOSIS — G309 Alzheimer's disease, unspecified: Secondary | ICD-10-CM | POA: Diagnosis not present

## 2021-03-20 DIAGNOSIS — R2681 Unsteadiness on feet: Secondary | ICD-10-CM | POA: Diagnosis not present

## 2021-03-20 DIAGNOSIS — Z7902 Long term (current) use of antithrombotics/antiplatelets: Secondary | ICD-10-CM | POA: Diagnosis not present

## 2021-03-20 DIAGNOSIS — R41841 Cognitive communication deficit: Secondary | ICD-10-CM | POA: Diagnosis not present

## 2021-03-20 DIAGNOSIS — Z7984 Long term (current) use of oral hypoglycemic drugs: Secondary | ICD-10-CM | POA: Diagnosis not present

## 2021-03-20 DIAGNOSIS — F0150 Vascular dementia without behavioral disturbance: Secondary | ICD-10-CM | POA: Diagnosis not present

## 2021-03-20 DIAGNOSIS — N183 Chronic kidney disease, stage 3 unspecified: Secondary | ICD-10-CM | POA: Diagnosis not present

## 2021-03-20 DIAGNOSIS — Z8673 Personal history of transient ischemic attack (TIA), and cerebral infarction without residual deficits: Secondary | ICD-10-CM | POA: Diagnosis not present

## 2021-03-20 DIAGNOSIS — E1149 Type 2 diabetes mellitus with other diabetic neurological complication: Secondary | ICD-10-CM | POA: Diagnosis not present

## 2021-03-23 DIAGNOSIS — G309 Alzheimer's disease, unspecified: Secondary | ICD-10-CM | POA: Diagnosis not present

## 2021-03-23 DIAGNOSIS — I639 Cerebral infarction, unspecified: Secondary | ICD-10-CM | POA: Diagnosis not present

## 2021-03-23 DIAGNOSIS — R011 Cardiac murmur, unspecified: Secondary | ICD-10-CM | POA: Diagnosis not present

## 2021-03-23 DIAGNOSIS — F0150 Vascular dementia without behavioral disturbance: Secondary | ICD-10-CM | POA: Diagnosis not present

## 2021-03-23 DIAGNOSIS — I69328 Other speech and language deficits following cerebral infarction: Secondary | ICD-10-CM | POA: Diagnosis not present

## 2021-03-23 DIAGNOSIS — F0280 Dementia in other diseases classified elsewhere without behavioral disturbance: Secondary | ICD-10-CM | POA: Diagnosis not present

## 2021-03-23 DIAGNOSIS — R270 Ataxia, unspecified: Secondary | ICD-10-CM | POA: Diagnosis not present

## 2021-03-23 DIAGNOSIS — E119 Type 2 diabetes mellitus without complications: Secondary | ICD-10-CM | POA: Diagnosis not present

## 2021-03-23 DIAGNOSIS — Z9289 Personal history of other medical treatment: Secondary | ICD-10-CM | POA: Diagnosis not present

## 2021-03-23 DIAGNOSIS — I10 Essential (primary) hypertension: Secondary | ICD-10-CM | POA: Diagnosis not present

## 2021-03-23 DIAGNOSIS — I6789 Other cerebrovascular disease: Secondary | ICD-10-CM | POA: Diagnosis not present

## 2021-03-27 DIAGNOSIS — Z8673 Personal history of transient ischemic attack (TIA), and cerebral infarction without residual deficits: Secondary | ICD-10-CM | POA: Diagnosis not present

## 2021-03-27 DIAGNOSIS — F0150 Vascular dementia without behavioral disturbance: Secondary | ICD-10-CM | POA: Diagnosis not present

## 2021-03-27 DIAGNOSIS — G309 Alzheimer's disease, unspecified: Secondary | ICD-10-CM | POA: Diagnosis not present

## 2021-03-27 DIAGNOSIS — E785 Hyperlipidemia, unspecified: Secondary | ICD-10-CM | POA: Diagnosis not present

## 2021-03-27 DIAGNOSIS — E1149 Type 2 diabetes mellitus with other diabetic neurological complication: Secondary | ICD-10-CM | POA: Diagnosis not present

## 2021-03-27 DIAGNOSIS — I129 Hypertensive chronic kidney disease with stage 1 through stage 4 chronic kidney disease, or unspecified chronic kidney disease: Secondary | ICD-10-CM | POA: Diagnosis not present

## 2021-03-27 DIAGNOSIS — N183 Chronic kidney disease, stage 3 unspecified: Secondary | ICD-10-CM | POA: Diagnosis not present

## 2021-03-27 DIAGNOSIS — I680 Cerebral amyloid angiopathy: Secondary | ICD-10-CM | POA: Diagnosis not present

## 2021-03-27 DIAGNOSIS — E1122 Type 2 diabetes mellitus with diabetic chronic kidney disease: Secondary | ICD-10-CM | POA: Diagnosis not present

## 2021-03-27 DIAGNOSIS — R2681 Unsteadiness on feet: Secondary | ICD-10-CM | POA: Diagnosis not present

## 2021-04-14 DIAGNOSIS — I10 Essential (primary) hypertension: Secondary | ICD-10-CM | POA: Diagnosis not present

## 2021-04-14 DIAGNOSIS — F0390 Unspecified dementia without behavioral disturbance: Secondary | ICD-10-CM | POA: Diagnosis not present

## 2021-04-17 DIAGNOSIS — I10 Essential (primary) hypertension: Secondary | ICD-10-CM | POA: Diagnosis not present

## 2021-04-17 DIAGNOSIS — E1122 Type 2 diabetes mellitus with diabetic chronic kidney disease: Secondary | ICD-10-CM | POA: Diagnosis not present

## 2021-04-17 DIAGNOSIS — E7800 Pure hypercholesterolemia, unspecified: Secondary | ICD-10-CM | POA: Diagnosis not present

## 2021-04-19 ENCOUNTER — Telehealth: Payer: Self-pay | Admitting: Neurology

## 2021-04-19 DIAGNOSIS — N183 Chronic kidney disease, stage 3 unspecified: Secondary | ICD-10-CM | POA: Diagnosis not present

## 2021-04-19 DIAGNOSIS — F0150 Vascular dementia without behavioral disturbance: Secondary | ICD-10-CM | POA: Diagnosis not present

## 2021-04-19 DIAGNOSIS — I680 Cerebral amyloid angiopathy: Secondary | ICD-10-CM | POA: Diagnosis not present

## 2021-04-19 DIAGNOSIS — E1149 Type 2 diabetes mellitus with other diabetic neurological complication: Secondary | ICD-10-CM | POA: Diagnosis not present

## 2021-04-19 DIAGNOSIS — E7800 Pure hypercholesterolemia, unspecified: Secondary | ICD-10-CM | POA: Diagnosis not present

## 2021-04-19 DIAGNOSIS — G309 Alzheimer's disease, unspecified: Secondary | ICD-10-CM | POA: Diagnosis not present

## 2021-04-19 DIAGNOSIS — I10 Essential (primary) hypertension: Secondary | ICD-10-CM | POA: Diagnosis not present

## 2021-04-19 DIAGNOSIS — E1122 Type 2 diabetes mellitus with diabetic chronic kidney disease: Secondary | ICD-10-CM | POA: Diagnosis not present

## 2021-04-19 DIAGNOSIS — I129 Hypertensive chronic kidney disease with stage 1 through stage 4 chronic kidney disease, or unspecified chronic kidney disease: Secondary | ICD-10-CM | POA: Diagnosis not present

## 2021-04-19 DIAGNOSIS — E785 Hyperlipidemia, unspecified: Secondary | ICD-10-CM | POA: Diagnosis not present

## 2021-04-19 DIAGNOSIS — E119 Type 2 diabetes mellitus without complications: Secondary | ICD-10-CM | POA: Diagnosis not present

## 2021-04-19 DIAGNOSIS — Z8673 Personal history of transient ischemic attack (TIA), and cerebral infarction without residual deficits: Secondary | ICD-10-CM | POA: Diagnosis not present

## 2021-04-19 DIAGNOSIS — R2681 Unsteadiness on feet: Secondary | ICD-10-CM | POA: Diagnosis not present

## 2021-04-19 NOTE — Telephone Encounter (Signed)
1. Which medications need refilled? (List name and dosage, if known) memantine ? ?2. Which pharmacy/location is medication to be sent to? (include street and city if local pharmacy) [facility] ? ?The pt never got the medication so he has not started it yet. They would like a new prescription set over.  ?

## 2021-04-20 MED ORDER — MEMANTINE HCL 10 MG PO TABS: ORAL_TABLET | ORAL | 0 refills | Status: DC

## 2021-04-20 NOTE — Telephone Encounter (Signed)
Spoke to the patient wife, Patient never received the Memantine 10 mg.  ?They called [facility] on [address] and was advised they do not have script on file. ? ? ?Memantine 10 mg sent to [facility] per pt request. ? ?Advise to let us know how he does on the medication. ? ?

## 2021-07-09 ENCOUNTER — Other Ambulatory Visit: Payer: Self-pay | Admitting: Neurology

## 2021-07-11 NOTE — Telephone Encounter (Signed)
Enough given until appt with sara

## 2021-07-21 ENCOUNTER — Other Ambulatory Visit: Payer: Self-pay | Admitting: Neurology

## 2021-07-24 ENCOUNTER — Other Ambulatory Visit: Payer: Self-pay | Admitting: Neurology

## 2021-09-12 ENCOUNTER — Ambulatory Visit: Payer: Medicare (Managed Care) | Admitting: Physician Assistant

## 2021-09-15 ENCOUNTER — Encounter: Payer: Self-pay | Admitting: Physician Assistant

## 2021-09-27 IMAGING — MR MR HEAD WO/W CM
12 series · 48 of 48 positions shown · IV contrast (multihance)
Comparison: 10/21/2017

CLINICAL DATA: Memory loss. Mixed Alzheimer in vascular dementia.
Falling.

EXAM:
MRI HEAD WITHOUT AND WITH CONTRAST
TECHNIQUE: Multiplanar, multiecho pulse sequences of the brain and surrounding
structures were obtained without and with intravenous contrast.
CONTRAST:  16mL MULTIHANCE GADOBENATE DIMEGLUMINE 529 MG/ML IV SOLN

[Series 2: T1 · sagittal · 5.0mm · 0.47mm/px · 2 of 23 slices shown]
[im 1/23]
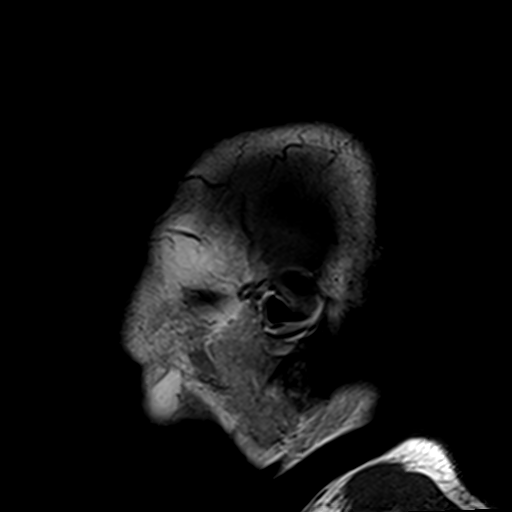
[im 23/23]
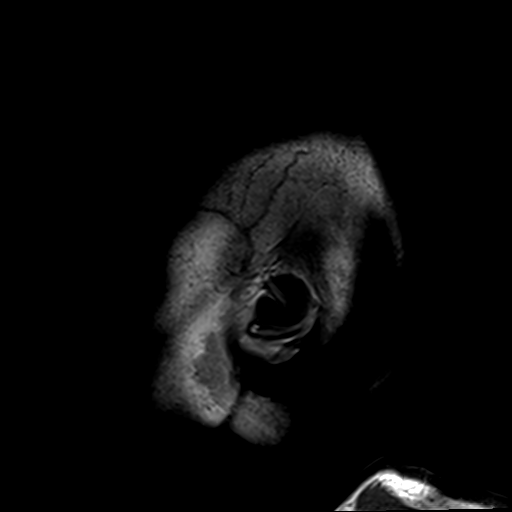

[Series 3: DWI · axial · 3.0mm · 1.88mm/px · z∈[-43,+103]mm · 7 of 100 slices shown (1 of 4)]
[im 1/100]
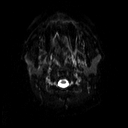
[im 17/100]
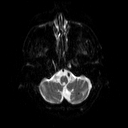
[im 34/100]
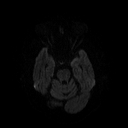
[im 50/100]
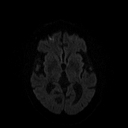
[im 67/100]
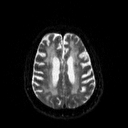
[im 83/100]
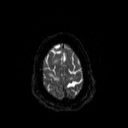
[im 100/100]
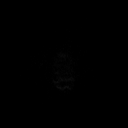

[Series 4: DWI · axial · 3.0mm · 1.88mm/px · z∈[-43,+103]mm · 3 of 50 slices shown (2 of 4)]
[im 1/50]
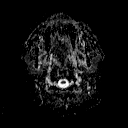
[im 25/50]
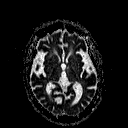
[im 50/50]
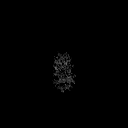

[Series 5: DWI · coronal · 5.0mm · 1.80mm/px · 4 of 65 slices shown (3 of 4)]
[im 1/65]
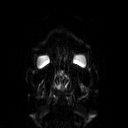
[im 22/65]
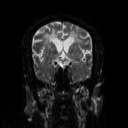
[im 43/65]
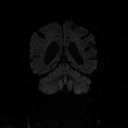
[im 65/65]
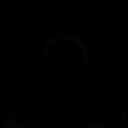

[Series 6: DWI · coronal · 5.0mm · 1.80mm/px · 2 of 34 slices shown (4 of 4)]
[im 1/34]
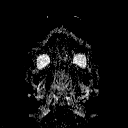
[im 34/34]
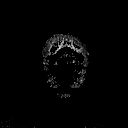

[Series 7: T2 · axial · 5.0mm · 0.62mm/px · 1 of 22 slices shown (1 of 2)]
[im 1/22]
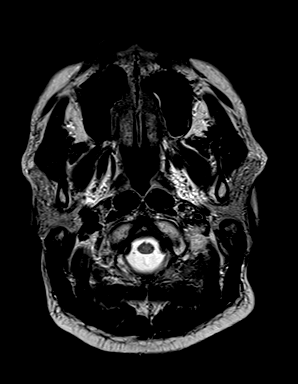

[Series 8: FLAIR · axial · 3.0mm · 0.47mm/px · z∈[-41,+101]mm · 2 of 32 slices shown]
[im 1/32]
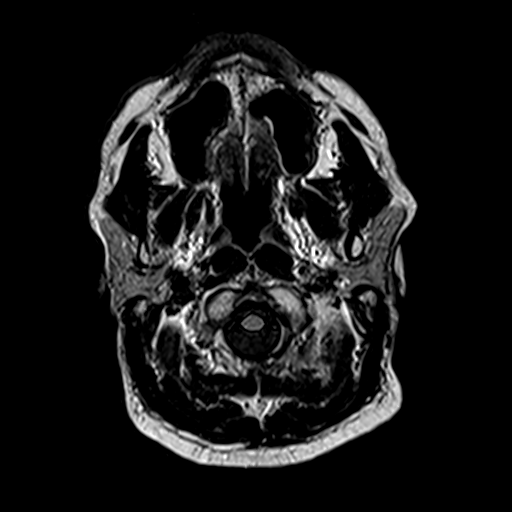
[im 32/32]
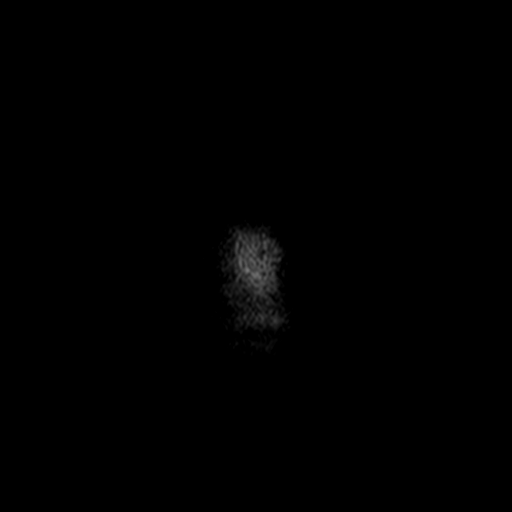

[Series 10: swi_images · axial · 4.0mm · 0.94mm/px · z∈[-46,+108]mm · 3 of 40 slices shown]
[im 1/40]
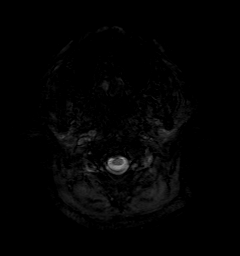
[im 20/40]
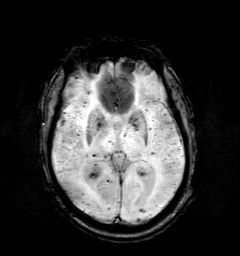
[im 40/40]
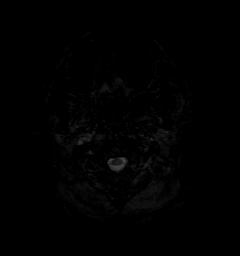

[Series 11: t1_mpr_tra · axial · 1.0mm · 0.75mm/px · z∈[-38,+103]mm · 10 of 144 slices shown (1 of 2)]
[im 1/144]
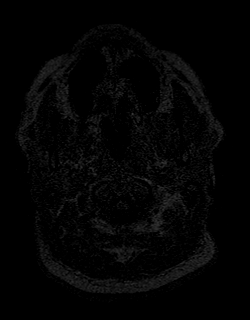
[im 16/144]
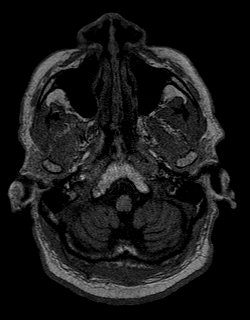
[im 32/144]
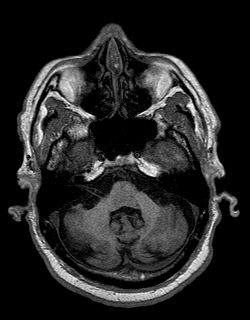
[im 48/144]
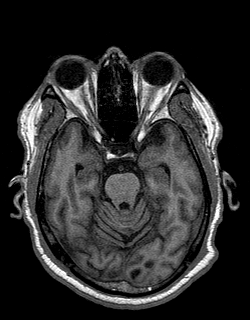
[im 64/144]
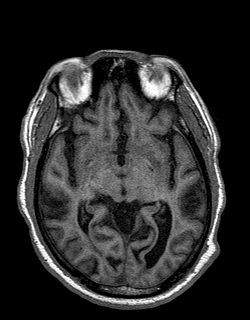
[im 80/144]
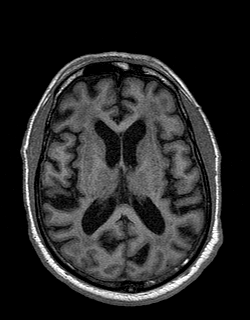
[im 96/144]
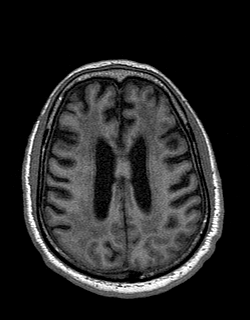
[im 112/144]
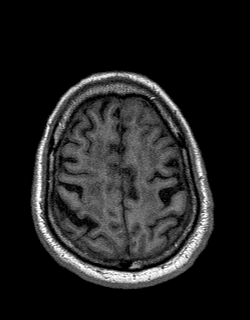
[im 128/144]
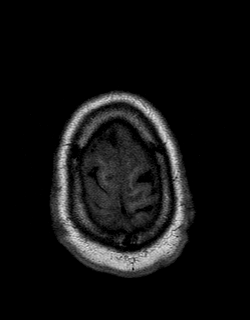
[im 144/144]
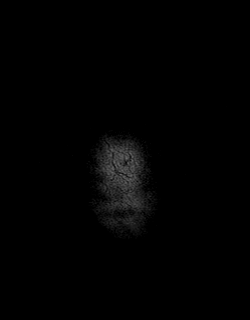

[Series 13: T2 · coronal · 5.0mm · 0.45mm/px · 2 of 27 slices shown (2 of 2)]
[im 1/27]
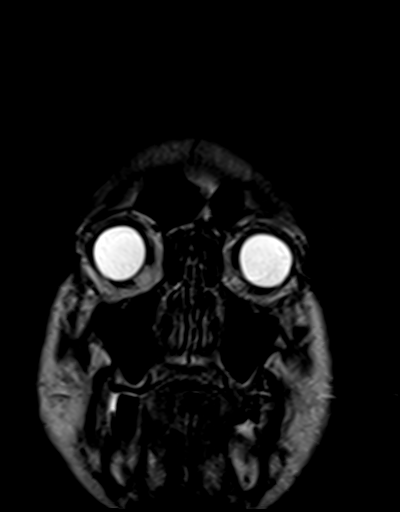
[im 27/27]
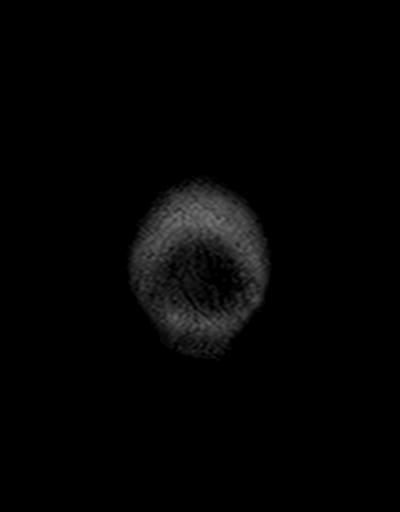

[Series 14: t1_mpr_tra · axial · 1.0mm · 0.75mm/px · z∈[-69,+73]mm · 10 of 144 slices shown (2 of 2)]
[im 1/144]
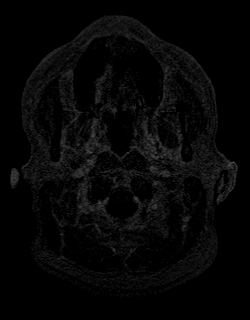
[im 16/144]
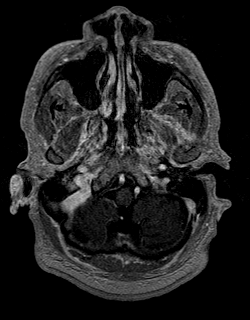
[im 32/144]
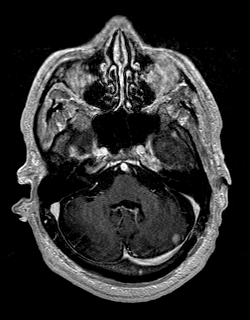
[im 48/144]
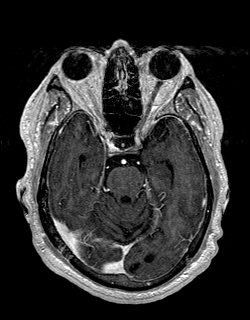
[im 64/144]
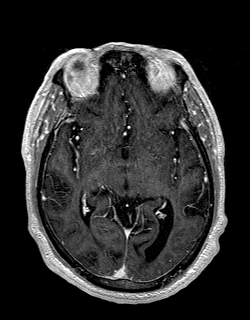
[im 80/144]
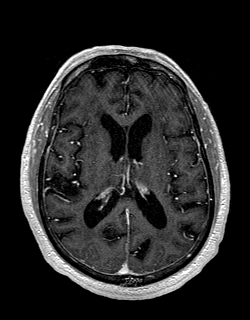
[im 96/144]
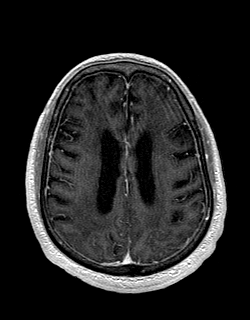
[im 112/144]
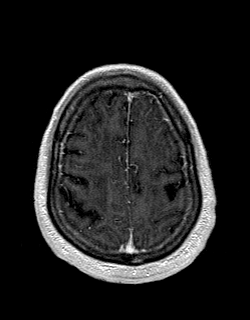
[im 128/144]
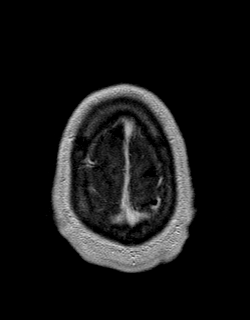
[im 144/144]
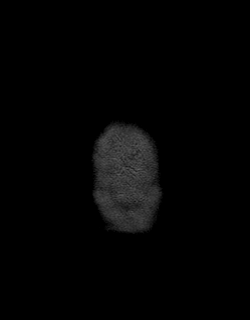

[Series 15: post cor · coronal · 5.0mm · 0.45mm/px · 2 of 27 slices shown]
[im 1/27]
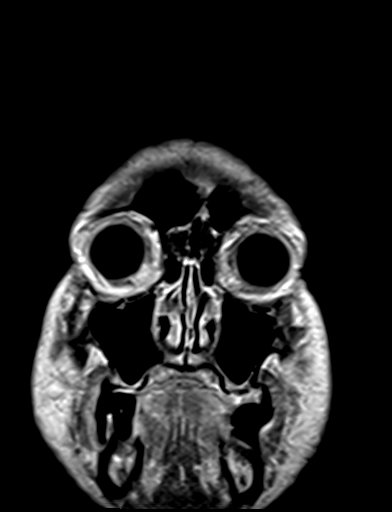
[im 27/27]
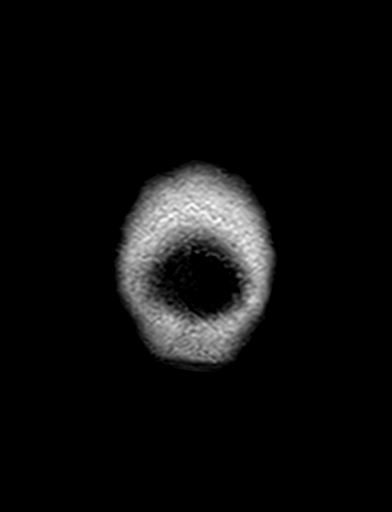

[48 of 48 positions shown; findings below may reference images not displayed]

FINDINGS: Brain: Diffusion imaging shows a punctate acute infarction in the
deep white matter of the left hemisphere adjacent to the posterior
body of the left lateral ventricle. No other acute finding. Chronic
small-vessel ischemic changes affect pons. Confluent chronic small
vessel ischemic changes are present throughout the cerebral
hemispheric white matter. Old small vessel infarction of the medial
right thalamus. Brain shows slight parietal predominance of the
atrophy. No hydrocephalus. Innumerable punctate foci of hemosiderin
deposition seen throughout the brain. No hydrocephalus. No
extra-axial fluid collection. Since the study of 1191, the white
matter disease is progressive.

Vascular: Major vessels at the base of the brain show flow.

Skull and upper cervical spine: Negative

Sinuses/Orbits: Clear/normal

Other: None
IMPRESSION: 1. Punctate acute infarction in the deep white matter of the left
hemisphere adjacent to the posterior body of the left lateral
ventricle.
2. Advanced chronic small-vessel ischemic changes throughout the
brain, progressive since 1191. Slight parietal predominance of
atrophy. Innumerable punctate foci of hemosiderin deposition most
typical of chronic hypertensive small vessel disease.

## 2021-10-05 ENCOUNTER — Ambulatory Visit: Payer: HMO | Admitting: Neurology

## 2021-10-12 ENCOUNTER — Other Ambulatory Visit: Payer: Self-pay | Admitting: Physician Assistant

## 2022-02-27 ENCOUNTER — Encounter (HOSPITAL_COMMUNITY): Payer: Self-pay

## 2022-02-27 ENCOUNTER — Other Ambulatory Visit: Payer: Self-pay

## 2022-02-27 ENCOUNTER — Emergency Department (HOSPITAL_COMMUNITY): Payer: Medicare (Managed Care)

## 2022-02-27 ENCOUNTER — Emergency Department (HOSPITAL_COMMUNITY)
Admission: EM | Admit: 2022-02-27 | Discharge: 2022-02-27 | Disposition: A | Discharge status: Home / Self Care | Payer: Medicare (Managed Care) | Attending: Emergency Medicine | Admitting: Emergency Medicine

## 2022-02-27 DIAGNOSIS — E119 Type 2 diabetes mellitus without complications: Secondary | ICD-10-CM | POA: Insufficient documentation

## 2022-02-27 DIAGNOSIS — Z8673 Personal history of transient ischemic attack (TIA), and cerebral infarction without residual deficits: Secondary | ICD-10-CM | POA: Insufficient documentation

## 2022-02-27 DIAGNOSIS — Z7902 Long term (current) use of antithrombotics/antiplatelets: Secondary | ICD-10-CM | POA: Insufficient documentation

## 2022-02-27 DIAGNOSIS — R059 Cough, unspecified: Secondary | ICD-10-CM | POA: Diagnosis present

## 2022-02-27 DIAGNOSIS — Z7984 Long term (current) use of oral hypoglycemic drugs: Secondary | ICD-10-CM | POA: Diagnosis not present

## 2022-02-27 DIAGNOSIS — U071 COVID-19: Secondary | ICD-10-CM | POA: Diagnosis not present

## 2022-02-27 LAB — URINALYSIS, ROUTINE W REFLEX MICROSCOPIC
Bacteria, UA: NONE SEEN
Bilirubin Urine: NEGATIVE
Glucose, UA: NEGATIVE mg/dL
Hgb urine dipstick: NEGATIVE
Ketones, ur: NEGATIVE mg/dL
Leukocytes,Ua: NEGATIVE
Nitrite: NEGATIVE
Protein, ur: 30 mg/dL — AB
Specific Gravity, Urine: 1.013 (ref 1.005–1.030)
pH: 7 (ref 5.0–8.0)

## 2022-02-27 LAB — CBC WITH DIFFERENTIAL/PLATELET
Abs Immature Granulocytes: 0.01 10*3/uL (ref 0.00–0.07)
Basophils Absolute: 0 10*3/uL (ref 0.0–0.1)
Basophils Relative: 0 %
Eosinophils Absolute: 0 10*3/uL (ref 0.0–0.5)
Eosinophils Relative: 0 %
HCT: 26.7 % — ABNORMAL LOW (ref 39.0–52.0)
Hemoglobin: 8.5 g/dL — ABNORMAL LOW (ref 13.0–17.0)
Immature Granulocytes: 0 %
Lymphocytes Relative: 23 %
Lymphs Abs: 0.8 10*3/uL (ref 0.7–4.0)
MCH: 31.6 pg (ref 26.0–34.0)
MCHC: 31.8 g/dL (ref 30.0–36.0)
MCV: 99.3 fL (ref 80.0–100.0)
Monocytes Absolute: 0.6 10*3/uL (ref 0.1–1.0)
Monocytes Relative: 19 %
Neutro Abs: 1.9 10*3/uL (ref 1.7–7.7)
Neutrophils Relative %: 58 %
Platelets: 113 10*3/uL — ABNORMAL LOW (ref 150–400)
RBC: 2.69 MIL/uL — ABNORMAL LOW (ref 4.22–5.81)
RDW: 13.6 % (ref 11.5–15.5)
WBC: 3.3 10*3/uL — ABNORMAL LOW (ref 4.0–10.5)
nRBC: 0 % (ref 0.0–0.2)

## 2022-02-27 LAB — RESP PANEL BY RT-PCR (RSV, FLU A&B, COVID)  RVPGX2
Influenza A by PCR: NEGATIVE
Influenza B by PCR: NEGATIVE
Resp Syncytial Virus by PCR: NEGATIVE
SARS Coronavirus 2 by RT PCR: POSITIVE — AB

## 2022-02-27 LAB — COMPREHENSIVE METABOLIC PANEL
ALT: 47 U/L — ABNORMAL HIGH (ref 0–44)
AST: 80 U/L — ABNORMAL HIGH (ref 15–41)
Albumin: 3.6 g/dL (ref 3.5–5.0)
Alkaline Phosphatase: 104 U/L (ref 38–126)
Anion gap: 9 (ref 5–15)
BUN: 14 mg/dL (ref 8–23)
CO2: 25 mmol/L (ref 22–32)
Calcium: 8.7 mg/dL — ABNORMAL LOW (ref 8.9–10.3)
Chloride: 106 mmol/L (ref 98–111)
Creatinine, Ser: 1.41 mg/dL — ABNORMAL HIGH (ref 0.61–1.24)
GFR, Estimated: 53 mL/min — ABNORMAL LOW (ref >60)
Glucose, Bld: 130 mg/dL — ABNORMAL HIGH (ref 70–99)
Potassium: 3.7 mmol/L (ref 3.5–5.1)
Sodium: 140 mmol/L (ref 135–145)
Total Bilirubin: 1.1 mg/dL (ref 0.3–1.2)
Total Protein: 6.5 g/dL (ref 6.5–8.1)

## 2022-02-27 LAB — LIPASE, BLOOD: Lipase: 63 U/L — ABNORMAL HIGH (ref 11–51)

## 2022-02-27 LAB — CBG MONITORING, ED: Glucose-Capillary: 133 mg/dL — ABNORMAL HIGH (ref 70–99)

## 2022-02-27 MED ORDER — SODIUM CHLORIDE 0.9 % IV BOLUS
500.0000 mL | Freq: Once | INTRAVENOUS | Status: AC
  Administered 2022-02-27: 500 mL via INTRAVENOUS

## 2022-02-27 MED ORDER — MOLNUPIRAVIR 200 MG PO CAPS: 4.0000 | ORAL_CAPSULE | Freq: Two times a day (BID) | ORAL | 0 refills | Status: AC

## 2022-02-27 MED ORDER — NIRMATRELVIR/RITONAVIR (PAXLOVID) TABLET (RENAL DOSING): 2.0000 | ORAL_TABLET | Freq: Once | ORAL | Status: DC

## 2022-02-27 NOTE — ED Provider Notes (Signed)
[facility] EMERGENCY DEPARTMENT Provider Note   CSN: [id number] Arrival date & time: 02/27/22  0934     History  Chief Complaint  Patient presents with   Stroke Symptoms    James Henderson is a 72 y.o. male.  Patient brought here from home as family is concerned for possibly stroke.  History of stroke on Plavix, history of dementia and diabetes as well.  Seems like maybe his last known normal was yesterday morning at 8 AM over 24 hours ago.  He typically ambulates with a walker and states that he has been having more weakness and more difficulty getting around the last day.  He denies any chest pain or shortness of breath but has had a cough.  Denies any fevers or chills.  Denies any weakness or numbness or vision changes.  Per EMS family thought he was drooling more than normal.  They thought maybe right-sided facial droop may be the history of that from prior strokes.  Denies any vision changes, no abdominal pain, nausea vomiting or diarrhea.  Denies pain with urination.  The history is provided by the patient and the EMS personnel.       Home Medications Prior to Admission medications   Medication Sig Start Date End Date Taking? Authorizing Provider  molnupiravir EUA (LAGEVRIO) 200 MG CAPS capsule Take 4 capsules (800 mg total) by mouth 2 (two) times daily for 5 days. 02/27/22 03/04/22 Yes Aaira Oestreicher, DO  acetaminophen (TYLENOL) 500 MG tablet Take 1,000 mg by mouth every 6 (six) hours as needed for headache (pain).    [provider]  amLODipine (NORVASC) 10 MG tablet Take 1 tablet (10 mg total) by mouth daily. 03/12/21   Patrecia Pour, MD  Brinzolamide-Brimonidine 1-0.2 % SUSP Place 1 drop into both eyes 2 (two) times daily.     [provider]  Cholecalciferol (VITAMIN D-3) 125 MCG (5000 UT) TABS Take 5,000 Units by mouth daily.    [provider]  clopidogrel (PLAVIX) 75 MG tablet Take 1 tablet (75 mg total) by mouth daily. 10/25/17    Purohit, Konrad Dolores, MD  donepezil (ARICEPT) 10 MG tablet TAKE ONE TABLET BY MOUTH EVERYDAY AT BEDTIME 07/25/21   Shawn Route, Coralee Pesa, PA-C  ezetimibe (ZETIA) 10 MG tablet Take 10 mg by mouth daily. 01/26/20   [provider]  glipiZIDE (GLUCOTROL) 5 MG tablet Take 5 mg by mouth daily.    [provider]  Inositol Niacinate 500 MG CAPS Take 500 mg by mouth every evening.    [provider]  losartan (COZAAR) 100 MG tablet Take 100 mg by mouth daily. 08/16/17   [provider]  memantine (NAMENDA) 10 MG tablet TAKE 1/2 TABLET BY MOUTH AT BEDTIME FOR 1 WEEK, THEN TAKE 1/2 TABLET BY MOUTH TWICE DAILY FOR 1 WEEK, THEN TAKE ONE TABLET BY MOUTH TWICE DAILY 07/25/21   Shawn Route, Coralee Pesa, PA-C  metFORMIN (GLUCOPHAGE) 1000 MG tablet Take 500-1,000 mg by mouth See admin instructions. Take1000 mg in the morning and 500 mg in the evening 08/16/17   [provider]  Omega-3 Fatty Acids (FISH OIL) 500 MG CAPS Take 500 mg by mouth every evening.    [provider]  rosuvastatin (CRESTOR) 40 MG tablet Take 1 tablet (40 mg total) by mouth daily at 6 PM. 10/24/17   Purohit, Konrad Dolores, MD  tolterodine (DETROL LA) 4 MG 24 hr capsule Take 4 mg by mouth daily. 03/30/20   [provider]  Vibegron (GEMTESA) 75 MG TABS Take 75 mg by mouth daily.    [provider]      Allergies    Patient has no known allergies.    Review of Systems   Review of Systems  Physical Exam Updated Vital Signs BP 138/75   Pulse 68   Temp 99.6 F (37.6 C) (Rectal)   Resp 18   Ht '5\' 5"'$  (1.651 m)   Wt 76 kg   SpO2 97%   BMI 27.88 kg/m  Physical Exam Vitals and nursing note reviewed.  Constitutional:      General: He is not in acute distress.    Appearance: He is well-developed. He is not ill-appearing.  HENT:     Head: Normocephalic and atraumatic.  Eyes:     Extraocular Movements: Extraocular movements intact.     Conjunctiva/sclera: Conjunctivae normal.     Pupils: Pupils  are equal, round, and reactive to light.  Cardiovascular:     Rate and Rhythm: Normal rate and regular rhythm.     Pulses: Normal pulses.     Heart sounds: No murmur heard. Pulmonary:     Effort: Pulmonary effort is normal. No respiratory distress.     Breath sounds: Normal breath sounds.  Abdominal:     Palpations: Abdomen is soft.     Tenderness: There is no abdominal tenderness.  Musculoskeletal:        General: No swelling.     Cervical back: Neck supple.  Skin:    General: Skin is warm and dry.     Capillary Refill: Capillary refill takes less than 2 seconds.  Neurological:     Mental Status: He is alert.     Comments: Patient is alert and awake and can answer questions pretty easily, he does seem to have some issues getting words out at times and slightly slurred speech not sure if this is baseline or not, appears to have grossly equal strength throughout with some deconditioning in his legs, sensation appears to be intact, visual fields are intact, no obvious facial droop  Psychiatric:        Mood and Affect: Mood normal.     ED Results / Procedures / Treatments   Labs (all labs ordered are listed, but only abnormal results are displayed) Labs Reviewed  RESP PANEL BY RT-PCR (RSV, FLU A&B, COVID)  RVPGX2 - Abnormal; Notable for the following components:      Result Value   SARS Coronavirus 2 by RT PCR POSITIVE (*)    All other components within normal limits  CBC WITH DIFFERENTIAL/PLATELET - Abnormal; Notable for the following components:   WBC 3.3 (*)    RBC 2.69 (*)    Hemoglobin 8.5 (*)    HCT 26.7 (*)    Platelets 113 (*)    All other components within normal limits  URINALYSIS, ROUTINE W REFLEX MICROSCOPIC - Abnormal; Notable for the following components:   Protein, ur 30 (*)    All other components within normal limits  COMPREHENSIVE METABOLIC PANEL - Abnormal; Notable for the following components:   Glucose, Bld 130 (*)    Creatinine, Ser 1.41 (*)    Calcium  8.7 (*)    AST 80 (*)    ALT 47 (*)    GFR, Estimated 53 (*)    All other components within normal limits  LIPASE, BLOOD - Abnormal; Notable for the following components:   Lipase 63 (*)    All other components within normal limits  CBG MONITORING, ED - Abnormal; Notable for the following components:   Glucose-Capillary 133 (*)    All other components within normal limits  URINE CULTURE    EKG EKG Interpretation  Date/Time:  Tuesday February 27 2022 09:36:32 EST Ventricular Rate:  77 PR Interval:  123 QRS Duration: 66 QT Interval:  361 QTC Calculation: 409 R Axis:   64 Text Interpretation: Sinus rhythm Borderline T wave abnormalities Confirmed by Lennice Sites ([id number]) on 02/27/2022 10:11:16 AM  Radiology MR BRAIN WO CONTRAST  Result Date: 02/27/2022 CLINICAL DATA:  Neuro deficit, acute, stroke suspected. EXAM: MRI HEAD WITHOUT CONTRAST TECHNIQUE: Multiplanar, multiecho pulse sequences of the brain and surrounding structures were obtained without intravenous contrast. COMPARISON:  MRI 03/11/2021 FINDINGS: Brain: Diffusion imaging does not show any acute or subacute infarction or other cause of restricted diffusion. Chronic small-vessel ischemic changes are seen throughout the pons. Numerous foci of hemosiderin deposition throughout the brainstem and cerebellum consistent with either chronic hypertensive disease or amyloid disease. Cerebral hemispheres show chronic confluent small-vessel ischemic changes throughout the white matter, similarly with innumerable punctate foci of hemosiderin deposition. There is a small old left occipital cortical infarction. Small meningioma just inferior to the posterior tentorium on the left measuring 11 mm is unchanged and not significant at this time. No hydrocephalus. No extra-axial collection. Vascular: Major vessels at the base of the brain show flow. Skull and upper cervical spine: Negative Sinuses/Orbits: Clear/normal Other: None IMPRESSION: 1. No acute  finding. Extensive chronic small-vessel ischemic changes throughout the brain as outlined above. Innumerable foci of hemosiderin deposition throughout the brain consistent with either chronic hypertensive disease or amyloid disease. 2. 11 mm meningioma just inferior to the posterior tentorium on the left, unchanged since the comparison study and not likely significant. Electronically Signed   By: Nelson Chimes M.D.   On: 02/27/2022 11:53   DG Chest Portable 1 View  Result Date: 02/27/2022 CLINICAL DATA:  Facial droop. EXAM: PORTABLE CHEST 1 VIEW COMPARISON:  10/21/2017 x-ray. FINDINGS: No consolidation, pneumothorax or effusion. Normal cardiopericardial silhouette. Slightly tortuous and ectatic aorta. Overlapping cardiac leads. Degenerative changes of the shoulders and spine. IMPRESSION: No acute cardiopulmonary disease Electronically Signed   By: Jill Side M.D.   On: 02/27/2022 10:12    Procedures Procedures    Medications Ordered in ED Medications  sodium chloride 0.9 % bolus 500 mL (500 mLs Intravenous New Bag/Given 02/27/22 1058)    ED Course/ Medical Decision Making/ A&P                           Medical Decision Making Amount and/or Complexity of Data Reviewed Labs: ordered. Radiology: ordered.  Risk Prescription drug management.   Kadden Wessell is here with generalized weakness and strokelike symptoms.  Normal vitals.  No fever.  Differential diagnosis is possibly infectious process versus stroke versus electrolyte abnormality versus dehydration.  Overall appears well.  He can provide good history.  Sounds like he uses a walker at baseline lives at home with his wife.  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  He has been having some increased weakness since yesterday morning about 25+ hours ago.  I do not find any focal findings on exam to suggest large vessel occlusion or stroke.  I do notice at times he has  some difficulty getting words out.  He has had stroke in the past including  dementia diabetes high blood pressure.  Will get CBC, CMP, lipase, urinalysis, COVID and flu testing, chest x-ray, head CT/MRI of head to evaluate.  Per my review and interpretation of labs patient positive for COVID.  Chest x-ray per my review and interpretation shows no evidence of pneumonia.  He has no significant leukocytosis or anemia or electrolyte abnormality.  No evidence of UTI.  MRI of the brain unremarkable.  No stroke.  Overall he appears pretty well.  I talked with family and everyone is comfortable with discharge to home with antiviral.  Understands return precautions.  Discharged in good condition.  This chart was dictated using voice recognition software.  Despite best efforts to proofread,  errors can occur which can change the documentation meaning.         Final Clinical Impression(s) / ED Diagnoses Final diagnoses:  COVID-19    Rx / DC Orders ED Discharge Orders          Ordered    molnupiravir EUA (LAGEVRIO) 200 MG CAPS capsule  2 times daily        02/27/22 1305              Lennice Sites, DO 02/27/22 1306

## 2022-02-27 NOTE — ED Notes (Signed)
Patient Alert and oriented to baseline. Stable and ambulatory to baseline. Patient verbalized understanding of the discharge instructions.  Patient belongings were taken by the patient.   

## 2022-02-27 NOTE — Discharge Instructions (Signed)
Overall suspect symptoms are from [place].  I have prescribed you an antiviral to take if you would like to.  If it is too expensive it is okay just to treat with Tylenol and ibuprofen and hydration.  Please return if symptoms worsen.

## 2022-02-27 NOTE — ED Triage Notes (Signed)
Pt arrived via [facility] from home. Per EMS, pt's wife states pt was drooling, couldn't stand or walk. Per EMS LKW 0800 yesterday. Per EMS, the facial droop is from prior stroke. Per EMS, pt's sx resolved with them and they were able to get pt to stand. VSS. Pt is A&Ox4

## 2022-02-27 NOTE — ED Notes (Signed)
Patient transported to MRI 

## 2022-02-28 LAB — URINE CULTURE

## 2022-03-28 ENCOUNTER — Other Ambulatory Visit: Payer: Self-pay

## 2022-03-28 ENCOUNTER — Ambulatory Visit
Admission: RE | Admit: 2022-03-28 | Discharge: 2022-03-28 | Disposition: A | Discharge status: Home / Self Care | Payer: Medicare (Managed Care) | Source: Ambulatory Visit

## 2022-03-28 DIAGNOSIS — M545 Low back pain, unspecified: Secondary | ICD-10-CM

## 2022-10-29 ENCOUNTER — Inpatient Hospital Stay (HOSPITAL_COMMUNITY)
Admission: EM | Admit: 2022-10-29 | Discharge: 2022-11-01 | DRG: 064 | Disposition: A | Discharge status: Rehabilitation Facility | Payer: Medicare (Managed Care) | Attending: Internal Medicine | Admitting: Internal Medicine

## 2022-10-29 ENCOUNTER — Emergency Department (HOSPITAL_COMMUNITY): Payer: Medicare (Managed Care)

## 2022-10-29 ENCOUNTER — Encounter (HOSPITAL_COMMUNITY): Payer: Self-pay

## 2022-10-29 ENCOUNTER — Other Ambulatory Visit: Payer: Self-pay

## 2022-10-29 DIAGNOSIS — E1149 Type 2 diabetes mellitus with other diabetic neurological complication: Secondary | ICD-10-CM | POA: Diagnosis present

## 2022-10-29 DIAGNOSIS — E1140 Type 2 diabetes mellitus with diabetic neuropathy, unspecified: Secondary | ICD-10-CM | POA: Diagnosis not present

## 2022-10-29 DIAGNOSIS — E785 Hyperlipidemia, unspecified: Secondary | ICD-10-CM | POA: Diagnosis present

## 2022-10-29 DIAGNOSIS — Z7984 Long term (current) use of oral hypoglycemic drugs: Secondary | ICD-10-CM

## 2022-10-29 DIAGNOSIS — R29704 NIHSS score 4: Secondary | ICD-10-CM | POA: Diagnosis present

## 2022-10-29 DIAGNOSIS — I1310 Hypertensive heart and chronic kidney disease without heart failure, with stage 1 through stage 4 chronic kidney disease, or unspecified chronic kidney disease: Secondary | ICD-10-CM | POA: Diagnosis present

## 2022-10-29 DIAGNOSIS — I639 Cerebral infarction, unspecified: Secondary | ICD-10-CM | POA: Diagnosis not present

## 2022-10-29 DIAGNOSIS — R32 Unspecified urinary incontinence: Secondary | ICD-10-CM | POA: Diagnosis present

## 2022-10-29 DIAGNOSIS — R471 Dysarthria and anarthria: Secondary | ICD-10-CM | POA: Diagnosis present

## 2022-10-29 DIAGNOSIS — R4701 Aphasia: Secondary | ICD-10-CM | POA: Diagnosis present

## 2022-10-29 DIAGNOSIS — N3281 Overactive bladder: Secondary | ICD-10-CM | POA: Diagnosis present

## 2022-10-29 DIAGNOSIS — I10 Essential (primary) hypertension: Secondary | ICD-10-CM | POA: Diagnosis not present

## 2022-10-29 DIAGNOSIS — I6381 Other cerebral infarction due to occlusion or stenosis of small artery: Secondary | ICD-10-CM | POA: Diagnosis not present

## 2022-10-29 DIAGNOSIS — I69354 Hemiplegia and hemiparesis following cerebral infarction affecting left non-dominant side: Secondary | ICD-10-CM

## 2022-10-29 DIAGNOSIS — R29703 NIHSS score 3: Secondary | ICD-10-CM | POA: Diagnosis present

## 2022-10-29 DIAGNOSIS — N1831 Chronic kidney disease, stage 3a: Secondary | ICD-10-CM | POA: Diagnosis present

## 2022-10-29 DIAGNOSIS — R296 Repeated falls: Secondary | ICD-10-CM | POA: Diagnosis present

## 2022-10-29 DIAGNOSIS — F0150 Vascular dementia without behavioral disturbance: Secondary | ICD-10-CM | POA: Diagnosis present

## 2022-10-29 DIAGNOSIS — I6350 Cerebral infarction due to unspecified occlusion or stenosis of unspecified cerebral artery: Secondary | ICD-10-CM | POA: Diagnosis present

## 2022-10-29 DIAGNOSIS — G309 Alzheimer's disease, unspecified: Secondary | ICD-10-CM | POA: Diagnosis not present

## 2022-10-29 DIAGNOSIS — Z7982 Long term (current) use of aspirin: Secondary | ICD-10-CM

## 2022-10-29 DIAGNOSIS — Z823 Family history of stroke: Secondary | ICD-10-CM

## 2022-10-29 DIAGNOSIS — I6330 Cerebral infarction due to thrombosis of unspecified cerebral artery: Principal | ICD-10-CM | POA: Diagnosis present

## 2022-10-29 DIAGNOSIS — I69320 Aphasia following cerebral infarction: Secondary | ICD-10-CM

## 2022-10-29 DIAGNOSIS — F01A0 Vascular dementia, mild, without behavioral disturbance, psychotic disturbance, mood disturbance, and anxiety: Secondary | ICD-10-CM | POA: Diagnosis present

## 2022-10-29 DIAGNOSIS — Z833 Family history of diabetes mellitus: Secondary | ICD-10-CM

## 2022-10-29 DIAGNOSIS — F0280 Dementia in other diseases classified elsewhere without behavioral disturbance: Secondary | ICD-10-CM | POA: Diagnosis present

## 2022-10-29 DIAGNOSIS — D329 Benign neoplasm of meninges, unspecified: Secondary | ICD-10-CM | POA: Diagnosis present

## 2022-10-29 DIAGNOSIS — Z79899 Other long term (current) drug therapy: Secondary | ICD-10-CM

## 2022-10-29 DIAGNOSIS — N179 Acute kidney failure, unspecified: Secondary | ICD-10-CM | POA: Diagnosis present

## 2022-10-29 DIAGNOSIS — Z7902 Long term (current) use of antithrombotics/antiplatelets: Secondary | ICD-10-CM

## 2022-10-29 DIAGNOSIS — Z87891 Personal history of nicotine dependence: Secondary | ICD-10-CM

## 2022-10-29 DIAGNOSIS — E1165 Type 2 diabetes mellitus with hyperglycemia: Secondary | ICD-10-CM | POA: Diagnosis present

## 2022-10-29 DIAGNOSIS — I618 Other nontraumatic intracerebral hemorrhage: Secondary | ICD-10-CM | POA: Diagnosis present

## 2022-10-29 DIAGNOSIS — E1122 Type 2 diabetes mellitus with diabetic chronic kidney disease: Secondary | ICD-10-CM | POA: Diagnosis present

## 2022-10-29 DIAGNOSIS — Z86011 Personal history of benign neoplasm of the brain: Secondary | ICD-10-CM

## 2022-10-29 DIAGNOSIS — F02A0 Dementia in other diseases classified elsewhere, mild, without behavioral disturbance, psychotic disturbance, mood disturbance, and anxiety: Secondary | ICD-10-CM | POA: Diagnosis present

## 2022-10-29 LAB — CBC WITH DIFFERENTIAL/PLATELET
Abs Immature Granulocytes: 0.01 10*3/uL (ref 0.00–0.07)
Basophils Absolute: 0 10*3/uL (ref 0.0–0.1)
Basophils Relative: 0 %
Eosinophils Absolute: 0 10*3/uL (ref 0.0–0.5)
Eosinophils Relative: 1 %
HCT: 40.8 % (ref 39.0–52.0)
Hemoglobin: 12.7 g/dL — ABNORMAL LOW (ref 13.0–17.0)
Immature Granulocytes: 0 %
Lymphocytes Relative: 30 %
Lymphs Abs: 1.6 10*3/uL (ref 0.7–4.0)
MCH: 29.5 pg (ref 26.0–34.0)
MCHC: 31.1 g/dL (ref 30.0–36.0)
MCV: 94.7 fL (ref 80.0–100.0)
Monocytes Absolute: 0.4 10*3/uL (ref 0.1–1.0)
Monocytes Relative: 8 %
Neutro Abs: 3.2 10*3/uL (ref 1.7–7.7)
Neutrophils Relative %: 61 %
Platelets: 226 10*3/uL (ref 150–400)
RBC: 4.31 MIL/uL (ref 4.22–5.81)
RDW: 13.1 % (ref 11.5–15.5)
WBC: 5.3 10*3/uL (ref 4.0–10.5)
nRBC: 0 % (ref 0.0–0.2)

## 2022-10-29 LAB — COMPREHENSIVE METABOLIC PANEL
ALT: 22 U/L (ref 0–44)
AST: 35 U/L (ref 15–41)
Albumin: 3.8 g/dL (ref 3.5–5.0)
Alkaline Phosphatase: 97 U/L (ref 38–126)
Anion gap: 10 (ref 5–15)
BUN: 18 mg/dL (ref 8–23)
CO2: 22 mmol/L (ref 22–32)
Calcium: 9 mg/dL (ref 8.9–10.3)
Chloride: 111 mmol/L (ref 98–111)
Creatinine, Ser: 1.7 mg/dL — ABNORMAL HIGH (ref 0.61–1.24)
GFR, Estimated: 42 mL/min — ABNORMAL LOW (ref >60)
Glucose, Bld: 108 mg/dL — ABNORMAL HIGH (ref 70–99)
Potassium: 3.9 mmol/L (ref 3.5–5.1)
Sodium: 143 mmol/L (ref 135–145)
Total Bilirubin: 0.7 mg/dL (ref 0.3–1.2)
Total Protein: 6.7 g/dL (ref 6.5–8.1)

## 2022-10-29 LAB — URINALYSIS, ROUTINE W REFLEX MICROSCOPIC
Bilirubin Urine: NEGATIVE
Glucose, UA: >=500 mg/dL — AB
Hgb urine dipstick: NEGATIVE
Ketones, ur: NEGATIVE mg/dL
Leukocytes,Ua: NEGATIVE
Nitrite: NEGATIVE
Protein, ur: NEGATIVE mg/dL
Specific Gravity, Urine: 1.018 (ref 1.005–1.030)
pH: 5 (ref 5.0–8.0)

## 2022-10-29 LAB — MAGNESIUM: Magnesium: 2.3 mg/dL (ref 1.7–2.4)

## 2022-10-29 LAB — TROPONIN I (HIGH SENSITIVITY): Troponin I (High Sensitivity): 16 ng/L (ref <18)

## 2022-10-29 LAB — CBG MONITORING, ED: Glucose-Capillary: 102 mg/dL — ABNORMAL HIGH (ref 70–99)

## 2022-10-29 MED ORDER — SENNOSIDES-DOCUSATE SODIUM 8.6-50 MG PO TABS: 1.0000 | ORAL_TABLET | Freq: Every evening | ORAL | Status: DC | PRN

## 2022-10-29 MED ORDER — ACETAMINOPHEN 160 MG/5ML PO SOLN: 650.0000 mg | ORAL | Status: DC | PRN

## 2022-10-29 MED ORDER — ACETAMINOPHEN 325 MG PO TABS: 650.0000 mg | ORAL_TABLET | ORAL | Status: DC | PRN

## 2022-10-29 MED ORDER — INSULIN ASPART 100 UNIT/ML IJ SOLN: 0.0000 [IU] | Freq: Every day | INTRAMUSCULAR | Status: DC

## 2022-10-29 MED ORDER — INSULIN ASPART 100 UNIT/ML IJ SOLN
0.0000 [IU] | Freq: Three times a day (TID) | INTRAMUSCULAR | Status: DC
  Administered 2022-10-30: 2 [IU] via SUBCUTANEOUS
  Administered 2022-10-31 (×2): 1 [IU] via SUBCUTANEOUS
  Administered 2022-11-01: 4 [IU] via SUBCUTANEOUS

## 2022-10-29 MED ORDER — STROKE: EARLY STAGES OF RECOVERY BOOK
Freq: Once | Status: AC
  Filled 2022-10-29: qty 1

## 2022-10-29 MED ORDER — ACETAMINOPHEN 650 MG RE SUPP: 650.0000 mg | RECTAL | Status: DC | PRN

## 2022-10-29 MED ORDER — SODIUM CHLORIDE 0.9 % IV BOLUS
1000.0000 mL | Freq: Once | INTRAVENOUS | Status: AC
  Administered 2022-10-29: 1000 mL via INTRAVENOUS

## 2022-10-29 MED ORDER — SODIUM CHLORIDE 0.9 % IV BOLUS: 1000.0000 mL | Freq: Once | INTRAVENOUS | Status: DC

## 2022-10-29 NOTE — ED Triage Notes (Signed)
Pt BIB EMS from home for frequent falls, 4 falls in past 24 hrs. Pt denies hitting head, pt on plavix. Pt has left sided deficits from past stroke but sx worsen for the past 3 days. UTI sx. Pt aphasic at baseline.

## 2022-10-29 NOTE — ED Provider Notes (Signed)
[place] EMERGENCY DEPARTMENT AT [facility] Provider Note   CSN: [id number] Arrival date & time: 10/29/22  4098     History  Chief Complaint  Patient presents with   Fall    Dorn Seidensticker is a 72 y.o. male.  72 yo M with a chief complaints of frequent falls.  He has fallen 4 times in the past 24 hours.  He thinks he is losing his balance.  Difficult to obtain a history due to aphasia from his stroke.  Denies any injuries.  Denies passing out.  Denies chest pain difficulty breathing.   Fall       Home Medications Prior to Admission medications   Medication Sig Start Date End Date Taking? Authorizing Provider  acetaminophen (TYLENOL) 500 MG tablet Take 1,000 mg by mouth every 6 (six) hours as needed for headache (pain).    [provider]  amLODipine (NORVASC) 10 MG tablet Take 1 tablet (10 mg total) by mouth daily. 03/12/21   Tyrone Nine, MD  Brinzolamide-Brimonidine 1-0.2 % SUSP Place 1 drop into both eyes 2 (two) times daily.     [provider]  Cholecalciferol (VITAMIN D-3) 125 MCG (5000 UT) TABS Take 5,000 Units by mouth daily.    [provider]  clopidogrel (PLAVIX) 75 MG tablet Take 1 tablet (75 mg total) by mouth daily. 10/25/17   Purohit, Salli Quarry, MD  donepezil (ARICEPT) 10 MG tablet TAKE ONE TABLET BY MOUTH EVERYDAY AT BEDTIME 07/25/21   Gwynneth Munson, Sung Amabile, PA-C  ezetimibe (ZETIA) 10 MG tablet Take 10 mg by mouth daily. 01/26/20   [provider]  glipiZIDE (GLUCOTROL) 5 MG tablet Take 5 mg by mouth daily.    [provider]  Inositol Niacinate 500 MG CAPS Take 500 mg by mouth every evening.    [provider]  losartan (COZAAR) 100 MG tablet Take 100 mg by mouth daily. 08/16/17   [provider]  memantine (NAMENDA) 10 MG tablet TAKE 1/2 TABLET BY MOUTH AT BEDTIME FOR 1 WEEK, THEN TAKE 1/2 TABLET BY MOUTH TWICE DAILY FOR 1 WEEK, THEN TAKE ONE TABLET BY MOUTH TWICE DAILY 07/25/21   Gwynneth Munson, Sung Amabile, PA-C   metFORMIN (GLUCOPHAGE) 1000 MG tablet Take 500-1,000 mg by mouth See admin instructions. Take1000 mg in the morning and 500 mg in the evening 08/16/17   [provider]  Omega-3 Fatty Acids (FISH OIL) 500 MG CAPS Take 500 mg by mouth every evening.    [provider]  rosuvastatin (CRESTOR) 40 MG tablet Take 1 tablet (40 mg total) by mouth daily at 6 PM. 10/24/17   Purohit, Salli Quarry, MD  tolterodine (DETROL LA) 4 MG 24 hr capsule Take 4 mg by mouth daily. 03/30/20   [provider]  Vibegron (GEMTESA) 75 MG TABS Take 75 mg by mouth daily.    [provider]      Allergies    Patient has no known allergies.    Review of Systems   Review of Systems  Physical Exam Updated Vital Signs BP 129/80   Pulse 60   Temp 98.6 F (37 C)   Resp 15   Ht 5\' 6"  (1.676 m)   Wt 77.1 kg   SpO2 100%   BMI 27.44 kg/m  Physical Exam Vitals and nursing note reviewed.  Constitutional:      Appearance: He is well-developed.  HENT:     Head: Normocephalic and atraumatic.  Eyes:     Pupils:  Pupils are equal, round, and reactive to light.  Neck:     Vascular: No JVD.  Cardiovascular:     Rate and Rhythm: Normal rate and regular rhythm.     Heart sounds: No murmur heard.    No friction rub. No gallop.  Pulmonary:     Effort: No respiratory distress.     Breath sounds: No wheezing.  Abdominal:     General: There is no distension.     Tenderness: There is no abdominal tenderness. There is no guarding or rebound.  Musculoskeletal:        General: Normal range of motion.     Cervical back: Normal range of motion and neck supple.  Skin:    Coloration: Skin is not pale.     Findings: No rash.  Neurological:     Mental Status: He is alert.  Psychiatric:        Behavior: Behavior normal.     ED Results / Procedures / Treatments   Labs (all labs ordered are listed, but only abnormal results are displayed) Labs Reviewed  CBC WITH DIFFERENTIAL/PLATELET - Abnormal;  Notable for the following components:      Result Value   Hemoglobin 12.7 (*)    All other components within normal limits  COMPREHENSIVE METABOLIC PANEL - Abnormal; Notable for the following components:   Glucose, Bld 108 (*)    Creatinine, Ser 1.70 (*)    GFR, Estimated 42 (*)    All other components within normal limits  URINALYSIS, ROUTINE W REFLEX MICROSCOPIC - Abnormal; Notable for the following components:   Glucose, UA >=500 (*)    Bacteria, UA RARE (*)    All other components within normal limits  CBG MONITORING, ED - Abnormal; Notable for the following components:   Glucose-Capillary 102 (*)    All other components within normal limits  MAGNESIUM  CBG MONITORING, ED  TROPONIN I (HIGH SENSITIVITY)    EKG EKG Interpretation Date/Time:  Monday October 29 2022 16:54:39 EDT Ventricular Rate:  76 PR Interval:  135 QRS Duration:  66 QT Interval:  348 QTC Calculation: 392 R Axis:   56  Text Interpretation: Sinus rhythm Minimal ST elevation, anterior leads No significant change since last tracing Confirmed by Melene Plan [phone]) on 10/29/2022 5:00:19 PM  Radiology MR BRAIN WO CONTRAST  Result Date: 10/29/2022 CLINICAL DATA:  Frequent falls.  Acute neurologic deficit. EXAM: MRI HEAD WITHOUT CONTRAST TECHNIQUE: Multiplanar, multiecho pulse sequences of the brain and surrounding structures were obtained without intravenous contrast. COMPARISON:  02/27/2022 FINDINGS: Brain: There is a small acute infarct within the right mid brain, within the periaqueductal gray matter. There are innumerable chronic microhemorrhages throughout the brain in a mixed central and peripheral distribution. No acute hemorrhage. There is confluent hyperintense T2-weighted signal within the white matter. Generalized volume loss. The midline structures are normal. Unchanged small meningioma, measuring approximately 9 mm, at the lateral inferior surface of the left tentorial leaflet. Vascular: Normal flow voids  Skull and upper cervical spine: No acute finding Sinuses/Orbits: Paranasal sinuses are clear. Bilateral ocular lens replacements. Other: None IMPRESSION: 1. Small acute infarct within the right mid brain, within the periaqueductal gray matter. No hemorrhage or mass effect. 2. Innumerable chronic microhemorrhages throughout the brain in a mixed central and peripheral distribution, concerning for cerebral amyloid angiopathy. Electronically Signed   By: Deatra Robinson M.D.   On: 10/29/2022 23:03   DG Chest Port 1 View  Result Date: 10/29/2022 CLINICAL DATA:  Altered mental  status EXAM: PORTABLE CHEST 1 VIEW COMPARISON:  None Available. FINDINGS: Lungs are well expanded, symmetric, and clear. No pneumothorax or pleural effusion. Cardiac size within normal limits. Pulmonary vascularity is normal. Osseous structures are age-appropriate. No acute bone abnormality. IMPRESSION: No active disease. Electronically Signed   By: Helyn Numbers M.D.   On: 10/29/2022 19:52   CT Head Wo Contrast  Result Date: 10/29/2022 CLINICAL DATA:  Fall EXAM: CT HEAD WITHOUT CONTRAST TECHNIQUE: Contiguous axial images were obtained from the base of the skull through the vertex without intravenous contrast. RADIATION DOSE REDUCTION: This exam was performed according to the departmental dose-optimization program which includes automated exposure control, adjustment of the mA and/or kV according to patient size and/or use of iterative reconstruction technique. COMPARISON:  03/11/2021 FINDINGS: Brain: There is no mass, hemorrhage or extra-axial collection. There is generalized atrophy without lobar predilection. Hypodensity of the white matter is most commonly associated with chronic microvascular disease. Vascular: Atherosclerotic calcification of the vertebral and internal carotid arteries at the skull base. No abnormal hyperdensity of the major intracranial arteries or dural venous sinuses. Skull: The visualized skull base, calvarium and  extracranial soft tissues are normal. Sinuses/Orbits: No fluid levels or advanced mucosal thickening of the visualized paranasal sinuses. No mastoid or middle ear effusion. The orbits are normal. IMPRESSION: 1. No acute intracranial abnormality. 2. Generalized atrophy and findings of chronic microvascular disease. Electronically Signed   By: Deatra Robinson M.D.   On: 10/29/2022 19:39    Procedures Procedures    Medications Ordered in ED Medications  sodium chloride 0.9 % bolus 1,000 mL (1,000 mLs Intravenous Not Given 10/29/22 2201)  sodium chloride 0.9 % bolus 1,000 mL (0 mLs Intravenous Stopped 10/29/22 2159)    ED Course/ Medical Decision Making/ A&P                                 Medical Decision Making Amount and/or Complexity of Data Reviewed Labs: ordered. Radiology: ordered. ECG/medicine tests: ordered.   72 yo M with a chief complaints of frequent falls.  History is difficult due to aphasia.  He does seem able to communicate effectively with me.  Denies any specific symptoms otherwise.  Will give a bolus of IV fluids.  Check electrolytes.  CT of the head.  On my record review the patient does have a history where he had difficulty ambulating and significantly was found to have a stroke.  Patient's family has arrived to provide further history.  They tell me that he typically walks with a walker and is bit unsteady at baseline but is gotten to the point where he is at significant difficulty getting up on his own and moving around.  They deny any specific injury in these falls.  Said the last time that this occurred he was diagnosed with a stroke.  Workup thus far with perhaps a mild acute kidney injury.  CT of the head without obvious acute change.  No other significant electrolyte abnormalities.  No acute anemia.  Chest x-ray independently interpreted by me without focal infiltrate.  MRI of brain with acute stroke.  Discussed with neuro recommends medical admission for stroke work  up.  The patients results and plan were reviewed and discussed.   Any x-rays performed were independently reviewed by myself.   Differential diagnosis were considered with the presenting HPI.  Medications  sodium chloride 0.9 % bolus 1,000 mL (1,000 mLs Intravenous Not Given  10/29/22 2201)  sodium chloride 0.9 % bolus 1,000 mL (0 mLs Intravenous Stopped 10/29/22 2159)    Vitals:   10/29/22 2100 10/29/22 2200 10/29/22 2215 10/29/22 2222  BP: 100/86 128/67 129/80   Pulse:      Resp: 13 17 15    Temp:      TempSrc:      SpO2:    100%  Weight:      Height:        Final diagnoses:  Cerebrovascular accident (CVA) due to thrombosis of cerebral artery (HCC)    Admission/ observation were discussed with the admitting physician, patient and/or family and they are comfortable with the plan.          Final Clinical Impression(s) / ED Diagnoses Final diagnoses:  Cerebrovascular accident (CVA) due to thrombosis of cerebral artery [facility])    Rx / DC Orders ED Discharge Orders     None         Melene Plan, DO 10/29/22 2315

## 2022-10-29 NOTE — ED Notes (Signed)
ED TO INPATIENT HANDOFF REPORT  ED Nurse Name and Phone #: Amil Amen [phone]  S Name/Age/Gender James Henderson 72 y.o. male Room/Bed: 017C/017C  Code Status   Code Status: Prior  Home/SNF/Other Home Patient oriented to: self, place, time, and situation Is this baseline? Yes   Triage Complete: Triage complete  Chief Complaint Acute ischemic stroke [facility]) [I63.9]  Triage Note Pt BIB EMS from home for frequent falls, 4 falls in past 24 hrs. Pt denies hitting head, pt on plavix. Pt has left sided deficits from past stroke but sx worsen for the past 3 days. UTI sx. Pt aphasic at baseline.    Allergies No Known Allergies  Level of Care/Admitting Diagnosis ED Disposition     ED Disposition  Admit   Condition  --   Comment  Hospital Area: [facility] [[id number]]  Level of Care: Telemetry Medical [104]  May place patient in observation at [facility] or [facility] if equivalent level of care is available:: No  Covid Evaluation: Asymptomatic - no recent exposure (last 10 days) testing not required  Diagnosis: Acute ischemic stroke [facility]) [[id number]]  Admitting Physician: Briscoe Deutscher [[id number]]  Attending Physician: Briscoe Deutscher [[id number]]          B Medical/Surgery History Past Medical History:  Diagnosis Date   Brainstem stroke (HCC) 2015   Cerebral meningioma (HCC)    Diabetes mellitus without complication (HCC)    Glaucoma    Hyperlipidemia    Hypertension    Past Surgical History:  Procedure Laterality Date   BIOPSY  02/15/2020   Procedure: BIOPSY;  Surgeon: Kerin Salen, MD;  Location: [facility] ENDOSCOPY;  Service: Gastroenterology;;   COLONOSCOPY WITH PROPOFOL N/A 02/15/2020   Procedure: COLONOSCOPY WITH PROPOFOL;  Surgeon: Kerin Salen, MD;  Location: [facility] ENDOSCOPY;  Service: Gastroenterology;  Laterality: N/A;   EYE SURGERY     POLYPECTOMY  02/15/2020   Procedure: POLYPECTOMY;  Surgeon: Kerin Salen, MD;  Location: [facility]OS[facility];  Service:  Gastroenterology;;     A IV Location/Drains/Wounds Patient Lines/Drains/Airways Status     Active Line/Drains/Airways     Name Placement date Placement time Site Days   Peripheral IV 10/29/22 20 G Anterior;Distal;Left;Upper Arm 10/29/22  1640  Arm  less than 1            Intake/Output Last 24 hours No intake or output data in the 24 hours ending 10/29/22 2323  Labs/Imaging Results for orders placed or performed during the hospital encounter of 10/29/22 (from the past 48 hour(s))  CBG monitoring, ED     Status: Abnormal   Collection Time: 10/29/22  4:45 PM  Result Value Ref Range   Glucose-Capillary 102 (H) 70 - 99 mg/dL    Comment: Glucose reference range applies only to samples taken after fasting for at least 8 hours.  CBC with Differential     Status: Abnormal   Collection Time: 10/29/22  4:47 PM  Result Value Ref Range   WBC 5.3 4.0 - 10.5 K/uL   RBC 4.31 4.22 - 5.81 MIL/uL   Hemoglobin 12.7 (L) 13.0 - 17.0 g/dL   HCT 08.6 57.8 - 46.9 %   MCV 94.7 80.0 - 100.0 fL   MCH 29.5 26.0 - 34.0 pg   MCHC 31.1 30.0 - 36.0 g/dL   RDW 62.9 52.8 - 41.3 %   Platelets 226 150 - 400 K/uL   nRBC 0.0 0.0 - 0.2 %   Neutrophils Relative % 61 %   Neutro Abs  3.2 1.7 - 7.7 K/uL   Lymphocytes Relative 30 %   Lymphs Abs 1.6 0.7 - 4.0 K/uL   Monocytes Relative 8 %   Monocytes Absolute 0.4 0.1 - 1.0 K/uL   Eosinophils Relative 1 %   Eosinophils Absolute 0.0 0.0 - 0.5 K/uL   Basophils Relative 0 %   Basophils Absolute 0.0 0.0 - 0.1 K/uL   Immature Granulocytes 0 %   Abs Immature Granulocytes 0.01 0.00 - 0.07 K/uL    Comment: Performed at [facility], [address], [place], [place] [address]  Comprehensive metabolic panel     Status: Abnormal   Collection Time: 10/29/22  4:47 PM  Result Value Ref Range   Sodium 143 135 - 145 mmol/L   Potassium 3.9 3.5 - 5.1 mmol/L   Chloride 111 98 - 111 mmol/L   CO2 22 22 - 32 mmol/L   Glucose, Bld 108 (H) 70 - 99 mg/dL    Comment:  Glucose reference range applies only to samples taken after fasting for at least 8 hours.   BUN 18 8 - 23 mg/dL   Creatinine, Ser 6.04 (H) 0.61 - 1.24 mg/dL   Calcium 9.0 8.9 - 54.0 mg/dL   Total Protein 6.7 6.5 - 8.1 g/dL   Albumin 3.8 3.5 - 5.0 g/dL   AST 35 15 - 41 U/L   ALT 22 0 - 44 U/L   Alkaline Phosphatase 97 38 - 126 U/L   Total Bilirubin 0.7 0.3 - 1.2 mg/dL   GFR, Estimated 42 (L) >60 mL/min    Comment: (NOTE) Calculated using the CKD-EPI Creatinine Equation (2021)    Anion gap 10 5 - 15    Comment: Performed at [facility], [address], [place], [place] [address]  Troponin I (High Sensitivity)     Status: None   Collection Time: 10/29/22  4:47 PM  Result Value Ref Range   Troponin I (High Sensitivity) 16 <18 ng/L    Comment: (NOTE) Elevated high sensitivity troponin I (hsTnI) values and significant  changes across serial measurements may suggest ACS but many other  chronic and acute conditions are known to elevate hsTnI results.  Refer to the "Links" section for chest pain algorithms and additional  guidance. Performed at [facility], [address], [place], [place] [address]   Magnesium     Status: None   Collection Time: 10/29/22  4:47 PM  Result Value Ref Range   Magnesium 2.3 1.7 - 2.4 mg/dL    Comment: Performed at [facility], [address], [place], [place] [address]  Urinalysis, Routine w reflex microscopic -Urine, Clean Catch     Status: Abnormal   Collection Time: 10/29/22  9:25 PM  Result Value Ref Range   Color, Urine YELLOW YELLOW   APPearance CLEAR CLEAR   Specific Gravity, Urine 1.018 1.005 - 1.030   pH 5.0 5.0 - 8.0   Glucose, UA >=500 (A) NEGATIVE mg/dL   Hgb urine dipstick NEGATIVE NEGATIVE   Bilirubin Urine NEGATIVE NEGATIVE   Ketones, ur NEGATIVE NEGATIVE mg/dL   Protein, ur NEGATIVE NEGATIVE mg/dL   Nitrite NEGATIVE NEGATIVE   Leukocytes,Ua NEGATIVE NEGATIVE   RBC / HPF 0-5 0 - 5 RBC/hpf   WBC, UA 0-5 0 - 5  WBC/hpf   Bacteria, UA RARE (A) NONE SEEN   Squamous Epithelial / HPF 0-5 0 - 5 /HPF    Comment: Performed at [facility], [address], [place], [place] [address]   MR BRAIN WO CONTRAST  Result Date: 10/29/2022 CLINICAL DATA:  Frequent falls.  Acute neurologic deficit. EXAM: MRI HEAD WITHOUT CONTRAST TECHNIQUE: Multiplanar, multiecho pulse sequences of the brain and surrounding structures were obtained without intravenous contrast. COMPARISON:  02/27/2022 FINDINGS: Brain: There is a small acute infarct within the right mid brain, within the periaqueductal gray matter. There are innumerable chronic microhemorrhages throughout the brain in a mixed central and peripheral distribution. No acute hemorrhage. There is confluent hyperintense T2-weighted signal within the white matter. Generalized volume loss. The midline structures are normal. Unchanged small meningioma, measuring approximately 9 mm, at the lateral inferior surface of the left tentorial leaflet. Vascular: Normal flow voids Skull and upper cervical spine: No acute finding Sinuses/Orbits: Paranasal sinuses are clear. Bilateral ocular lens replacements. Other: None IMPRESSION: 1. Small acute infarct within the right mid brain, within the periaqueductal gray matter. No hemorrhage or mass effect. 2. Innumerable chronic microhemorrhages throughout the brain in a mixed central and peripheral distribution, concerning for cerebral amyloid angiopathy. Electronically Signed   By: Deatra Robinson M.D.   On: 10/29/2022 23:03   DG Chest Port 1 View  Result Date: 10/29/2022 CLINICAL DATA:  Altered mental status EXAM: PORTABLE CHEST 1 VIEW COMPARISON:  None Available. FINDINGS: Lungs are well expanded, symmetric, and clear. No pneumothorax or pleural effusion. Cardiac size within normal limits. Pulmonary vascularity is normal. Osseous structures are age-appropriate. No acute bone abnormality. IMPRESSION: No active disease. Electronically Signed   By:  Helyn Numbers M.D.   On: 10/29/2022 19:52   CT Head Wo Contrast  Result Date: 10/29/2022 CLINICAL DATA:  Fall EXAM: CT HEAD WITHOUT CONTRAST TECHNIQUE: Contiguous axial images were obtained from the base of the skull through the vertex without intravenous contrast. RADIATION DOSE REDUCTION: This exam was performed according to the departmental dose-optimization program which includes automated exposure control, adjustment of the mA and/or kV according to patient size and/or use of iterative reconstruction technique. COMPARISON:  03/11/2021 FINDINGS: Brain: There is no mass, hemorrhage or extra-axial collection. There is generalized atrophy without lobar predilection. Hypodensity of the white matter is most commonly associated with chronic microvascular disease. Vascular: Atherosclerotic calcification of the vertebral and internal carotid arteries at the skull base. No abnormal hyperdensity of the major intracranial arteries or dural venous sinuses. Skull: The visualized skull base, calvarium and extracranial soft tissues are normal. Sinuses/Orbits: No fluid levels or advanced mucosal thickening of the visualized paranasal sinuses. No mastoid or middle ear effusion. The orbits are normal. IMPRESSION: 1. No acute intracranial abnormality. 2. Generalized atrophy and findings of chronic microvascular disease. Electronically Signed   By: Deatra Robinson M.D.   On: 10/29/2022 19:39    Pending Labs Unresulted Labs (From admission, onward)    None       Vitals/Pain Today's Vitals   10/29/22 2100 10/29/22 2200 10/29/22 2215 10/29/22 2222  BP: 100/86 128/67 129/80   Pulse:      Resp: 13 17 15    Temp:      TempSrc:      SpO2:    100%  Weight:      Height:      PainSc:        Isolation Precautions No active isolations  Medications Medications  sodium chloride 0.9 % bolus 1,000 mL (1,000 mLs Intravenous Not Given 10/29/22 2201)  sodium chloride 0.9 % bolus 1,000 mL (0 mLs Intravenous Stopped 10/29/22  2159)    Mobility walks with device  Focused Assessments Neuro Assessment Handoff:  Swallow screen pass? Yes          Neuro Assessment:   Neuro Checks:      Has TPA been given? No If patient is a Neuro Trauma and patient is going to OR before floor call report to 4N Charge nurse: [phone] or [phone]   R Recommendations: See Admitting Provider Note  Report given to:   Additional Notes: see above

## 2022-10-30 ENCOUNTER — Observation Stay (HOSPITAL_COMMUNITY): Payer: Medicare (Managed Care)

## 2022-10-30 DIAGNOSIS — F01A0 Vascular dementia, mild, without behavioral disturbance, psychotic disturbance, mood disturbance, and anxiety: Secondary | ICD-10-CM | POA: Diagnosis present

## 2022-10-30 DIAGNOSIS — N3281 Overactive bladder: Secondary | ICD-10-CM | POA: Diagnosis present

## 2022-10-30 DIAGNOSIS — I10 Essential (primary) hypertension: Secondary | ICD-10-CM | POA: Diagnosis not present

## 2022-10-30 DIAGNOSIS — R32 Unspecified urinary incontinence: Secondary | ICD-10-CM | POA: Diagnosis present

## 2022-10-30 DIAGNOSIS — Z794 Long term (current) use of insulin: Secondary | ICD-10-CM | POA: Diagnosis not present

## 2022-10-30 DIAGNOSIS — N179 Acute kidney failure, unspecified: Secondary | ICD-10-CM | POA: Diagnosis present

## 2022-10-30 DIAGNOSIS — I618 Other nontraumatic intracerebral hemorrhage: Secondary | ICD-10-CM | POA: Diagnosis present

## 2022-10-30 DIAGNOSIS — Z87891 Personal history of nicotine dependence: Secondary | ICD-10-CM | POA: Diagnosis not present

## 2022-10-30 DIAGNOSIS — I6381 Other cerebral infarction due to occlusion or stenosis of small artery: Secondary | ICD-10-CM

## 2022-10-30 DIAGNOSIS — E785 Hyperlipidemia, unspecified: Secondary | ICD-10-CM | POA: Diagnosis present

## 2022-10-30 DIAGNOSIS — E1140 Type 2 diabetes mellitus with diabetic neuropathy, unspecified: Secondary | ICD-10-CM | POA: Diagnosis not present

## 2022-10-30 DIAGNOSIS — R471 Dysarthria and anarthria: Secondary | ICD-10-CM | POA: Diagnosis present

## 2022-10-30 DIAGNOSIS — I6330 Cerebral infarction due to thrombosis of unspecified cerebral artery: Secondary | ICD-10-CM | POA: Diagnosis present

## 2022-10-30 DIAGNOSIS — E1122 Type 2 diabetes mellitus with diabetic chronic kidney disease: Secondary | ICD-10-CM | POA: Diagnosis present

## 2022-10-30 DIAGNOSIS — F02A0 Dementia in other diseases classified elsewhere, mild, without behavioral disturbance, psychotic disturbance, mood disturbance, and anxiety: Secondary | ICD-10-CM | POA: Diagnosis present

## 2022-10-30 DIAGNOSIS — Z7902 Long term (current) use of antithrombotics/antiplatelets: Secondary | ICD-10-CM | POA: Diagnosis not present

## 2022-10-30 DIAGNOSIS — E1165 Type 2 diabetes mellitus with hyperglycemia: Secondary | ICD-10-CM | POA: Diagnosis present

## 2022-10-30 DIAGNOSIS — Z86011 Personal history of benign neoplasm of the brain: Secondary | ICD-10-CM | POA: Diagnosis not present

## 2022-10-30 DIAGNOSIS — E1121 Type 2 diabetes mellitus with diabetic nephropathy: Secondary | ICD-10-CM | POA: Diagnosis not present

## 2022-10-30 DIAGNOSIS — I6350 Cerebral infarction due to unspecified occlusion or stenosis of unspecified cerebral artery: Secondary | ICD-10-CM | POA: Diagnosis not present

## 2022-10-30 DIAGNOSIS — Z79899 Other long term (current) drug therapy: Secondary | ICD-10-CM | POA: Diagnosis not present

## 2022-10-30 DIAGNOSIS — I69354 Hemiplegia and hemiparesis following cerebral infarction affecting left non-dominant side: Secondary | ICD-10-CM | POA: Diagnosis not present

## 2022-10-30 DIAGNOSIS — I639 Cerebral infarction, unspecified: Secondary | ICD-10-CM | POA: Diagnosis present

## 2022-10-30 DIAGNOSIS — R4701 Aphasia: Secondary | ICD-10-CM | POA: Diagnosis present

## 2022-10-30 DIAGNOSIS — N1831 Chronic kidney disease, stage 3a: Secondary | ICD-10-CM | POA: Diagnosis present

## 2022-10-30 DIAGNOSIS — D329 Benign neoplasm of meninges, unspecified: Secondary | ICD-10-CM | POA: Diagnosis present

## 2022-10-30 DIAGNOSIS — Z7984 Long term (current) use of oral hypoglycemic drugs: Secondary | ICD-10-CM | POA: Diagnosis not present

## 2022-10-30 DIAGNOSIS — I69320 Aphasia following cerebral infarction: Secondary | ICD-10-CM | POA: Diagnosis not present

## 2022-10-30 DIAGNOSIS — Z833 Family history of diabetes mellitus: Secondary | ICD-10-CM | POA: Diagnosis not present

## 2022-10-30 DIAGNOSIS — I6389 Other cerebral infarction: Secondary | ICD-10-CM | POA: Diagnosis not present

## 2022-10-30 DIAGNOSIS — I1310 Hypertensive heart and chronic kidney disease without heart failure, with stage 1 through stage 4 chronic kidney disease, or unspecified chronic kidney disease: Secondary | ICD-10-CM | POA: Diagnosis present

## 2022-10-30 DIAGNOSIS — Z7982 Long term (current) use of aspirin: Secondary | ICD-10-CM | POA: Diagnosis not present

## 2022-10-30 DIAGNOSIS — Z823 Family history of stroke: Secondary | ICD-10-CM | POA: Diagnosis not present

## 2022-10-30 DIAGNOSIS — R296 Repeated falls: Secondary | ICD-10-CM | POA: Diagnosis present

## 2022-10-30 DIAGNOSIS — G309 Alzheimer's disease, unspecified: Secondary | ICD-10-CM | POA: Diagnosis present

## 2022-10-30 DIAGNOSIS — I69322 Dysarthria following cerebral infarction: Secondary | ICD-10-CM | POA: Diagnosis not present

## 2022-10-30 LAB — CBC
HCT: 35.2 % — ABNORMAL LOW (ref 39.0–52.0)
Hemoglobin: 11 g/dL — ABNORMAL LOW (ref 13.0–17.0)
MCH: 29 pg (ref 26.0–34.0)
MCHC: 31.3 g/dL (ref 30.0–36.0)
MCV: 92.9 fL (ref 80.0–100.0)
Platelets: 210 10*3/uL (ref 150–400)
RBC: 3.79 MIL/uL — ABNORMAL LOW (ref 4.22–5.81)
RDW: 13.2 % (ref 11.5–15.5)
WBC: 4.8 10*3/uL (ref 4.0–10.5)
nRBC: 0 % (ref 0.0–0.2)

## 2022-10-30 LAB — GLUCOSE, CAPILLARY
Glucose-Capillary: 120 mg/dL — ABNORMAL HIGH (ref 70–99)
Glucose-Capillary: 126 mg/dL — ABNORMAL HIGH (ref 70–99)
Glucose-Capillary: 166 mg/dL — ABNORMAL HIGH (ref 70–99)
Glucose-Capillary: 232 mg/dL — ABNORMAL HIGH (ref 70–99)
Glucose-Capillary: 99 mg/dL (ref 70–99)

## 2022-10-30 LAB — HEMOGLOBIN A1C
Hgb A1c MFr Bld: 7.2 % — ABNORMAL HIGH (ref 4.8–5.6)
Mean Plasma Glucose: 159.94 mg/dL

## 2022-10-30 LAB — BASIC METABOLIC PANEL
Anion gap: 11 (ref 5–15)
BUN: 18 mg/dL (ref 8–23)
CO2: 24 mmol/L (ref 22–32)
Calcium: 8.9 mg/dL (ref 8.9–10.3)
Chloride: 107 mmol/L (ref 98–111)
Creatinine, Ser: 1.51 mg/dL — ABNORMAL HIGH (ref 0.61–1.24)
GFR, Estimated: 48 mL/min — ABNORMAL LOW (ref >60)
Glucose, Bld: 123 mg/dL — ABNORMAL HIGH (ref 70–99)
Potassium: 3.5 mmol/L (ref 3.5–5.1)
Sodium: 142 mmol/L (ref 135–145)

## 2022-10-30 LAB — LIPID PANEL
Cholesterol: 86 mg/dL (ref 0–200)
HDL: 39 mg/dL — ABNORMAL LOW (ref >40)
LDL Cholesterol: 35 mg/dL (ref 0–99)
Total CHOL/HDL Ratio: 2.2 ratio
Triglycerides: 58 mg/dL (ref <150)
VLDL: 12 mg/dL (ref 0–40)

## 2022-10-30 MED ORDER — ROSUVASTATIN CALCIUM 20 MG PO TABS
40.0000 mg | ORAL_TABLET | Freq: Every day | ORAL | Status: DC
  Administered 2022-10-30 – 2022-10-31 (×2): 40 mg via ORAL
  Filled 2022-10-30 (×2): qty 2

## 2022-10-30 MED ORDER — DONEPEZIL HCL 10 MG PO TABS
10.0000 mg | ORAL_TABLET | Freq: Every day | ORAL | Status: DC
  Administered 2022-10-30 – 2022-10-31 (×2): 10 mg via ORAL
  Filled 2022-10-30 (×2): qty 1

## 2022-10-30 MED ORDER — CLOPIDOGREL BISULFATE 75 MG PO TABS
75.0000 mg | ORAL_TABLET | Freq: Every day | ORAL | Status: DC
  Administered 2022-10-30: 75 mg via ORAL
  Filled 2022-10-30: qty 1

## 2022-10-30 MED ORDER — IOHEXOL 350 MG/ML SOLN
75.0000 mL | Freq: Once | INTRAVENOUS | Status: AC | PRN
  Administered 2022-10-30: 75 mL via INTRAVENOUS

## 2022-10-30 MED ORDER — EZETIMIBE 10 MG PO TABS
10.0000 mg | ORAL_TABLET | Freq: Every day | ORAL | Status: DC
  Administered 2022-10-31 – 2022-11-01 (×2): 10 mg via ORAL
  Filled 2022-10-30 (×2): qty 1

## 2022-10-30 MED ORDER — ASPIRIN 81 MG PO TBEC
81.0000 mg | DELAYED_RELEASE_TABLET | Freq: Every day | ORAL | Status: DC
  Administered 2022-10-30 – 2022-11-01 (×3): 81 mg via ORAL
  Filled 2022-10-30 (×3): qty 1

## 2022-10-30 MED ORDER — MEMANTINE HCL 10 MG PO TABS
10.0000 mg | ORAL_TABLET | Freq: Two times a day (BID) | ORAL | Status: DC
  Administered 2022-10-30 – 2022-11-01 (×5): 10 mg via ORAL
  Filled 2022-10-30 (×5): qty 1

## 2022-10-30 MED ORDER — TICAGRELOR 90 MG PO TABS
90.0000 mg | ORAL_TABLET | Freq: Two times a day (BID) | ORAL | Status: DC
  Administered 2022-10-31 – 2022-11-01 (×3): 90 mg via ORAL
  Filled 2022-10-30 (×3): qty 1

## 2022-10-30 MED ORDER — AMLODIPINE BESYLATE 10 MG PO TABS
10.0000 mg | ORAL_TABLET | Freq: Every day | ORAL | Status: DC
  Administered 2022-10-30 – 2022-11-01 (×3): 10 mg via ORAL
  Filled 2022-10-30 (×3): qty 1

## 2022-10-30 NOTE — H&P (Signed)
History and Physical    Ramondo Heal ZOX:[id number] DOB: September 18, 1950 DOA: 10/29/2022  PCP: [facility]   Patient coming from: Home   Chief Complaint: Falls, increased left-sided weakness   HPI: Jerone Washabaugh is a pleasant 72 y.o. male with medical history significant for type 2 diabetes mellitus, hypertension, hyperlipidemia, and history of CVA with residual aphasia and left-sided weakness who presents to the ED with recurrent falls and increased left-sided weakness.  Patient had 4 falls in the last 24 hours and his wife noted that he has been dragging his left leg today.  He has baseline left-sided weakness but is normally able to ambulate with a walker and had not been dragging the left leg until today.  Patient denies hitting his head or suffering any appreciable injury from the falls.  He has aphasia from prior stroke but has not had any difficulty swallowing and eats a normal diet at home.  ED Course: Upon arrival to the ED, patient is found to be afebrile and saturating well on room air with normal heart rate and stable blood pressure.  EKG demonstrates sinus rhythm and head CT is negative for acute intracranial abnormality.  MRI brain reveals small acute infarct in the right midbrain as well as innumerable chronic microhemorrhages throughout the brain.  Labs are most notable for creatinine 1.70.  Neurology was consulted by the ED physician and the patient was given 2 L normal saline.  Review of Systems:  All other systems reviewed and apart from HPI, are negative.  Past Medical History:  Diagnosis Date   Brainstem stroke (HCC) 2015   Cerebral meningioma (HCC)    Diabetes mellitus without complication (HCC)    Glaucoma    Hyperlipidemia    Hypertension     Past Surgical History:  Procedure Laterality Date   BIOPSY  02/15/2020   Procedure: BIOPSY;  Surgeon: Kerin Salen, MD;  Location: [facility];  Service: Gastroenterology;;   COLONOSCOPY  WITH PROPOFOL N/A 02/15/2020   Procedure: COLONOSCOPY WITH PROPOFOL;  Surgeon: Kerin Salen, MD;  Location: [facility];  Service: Gastroenterology;  Laterality: N/A;   EYE SURGERY     POLYPECTOMY  02/15/2020   Procedure: POLYPECTOMY;  Surgeon: Kerin Salen, MD;  Location: [facility];  Service: Gastroenterology;;    Social History:   reports that he quit smoking about 24 years ago. He has never used smokeless tobacco. He reports that he does not drink alcohol and does not use drugs.  No Known Allergies  Family History  Problem Relation Age of Onset   Stroke Mother    Stroke Brother    Diabetes type II Brother      Prior to Admission medications   Medication Sig Start Date End Date Taking? Authorizing Provider  acetaminophen (TYLENOL) 500 MG tablet Take 1,000 mg by mouth every 6 (six) hours as needed for headache (pain).    [provider]  amLODipine (NORVASC) 10 MG tablet Take 1 tablet (10 mg total) by mouth daily. 03/12/21   Tyrone Nine, MD  Brinzolamide-Brimonidine 1-0.2 % SUSP Place 1 drop into both eyes 2 (two) times daily.     [provider]  Cholecalciferol (VITAMIN D-3) 125 MCG (5000 UT) TABS Take 5,000 Units by mouth daily.    [provider]  clopidogrel (PLAVIX) 75 MG tablet Take 1 tablet (75 mg total) by mouth daily. 10/25/17   Purohit, Salli Quarry, MD  donepezil (ARICEPT) 10 MG tablet TAKE ONE TABLET BY MOUTH EVERYDAY  AT BEDTIME 07/25/21   Marcos Eke, PA-C  ezetimibe (ZETIA) 10 MG tablet Take 10 mg by mouth daily. 01/26/20   [provider]  glipiZIDE (GLUCOTROL) 5 MG tablet Take 5 mg by mouth daily.    [provider]  Inositol Niacinate 500 MG CAPS Take 500 mg by mouth every evening.    [provider]  losartan (COZAAR) 100 MG tablet Take 100 mg by mouth daily. 08/16/17   [provider]  memantine (NAMENDA) 10 MG tablet TAKE 1/2 TABLET BY MOUTH AT BEDTIME FOR 1 WEEK, THEN TAKE 1/2 TABLET BY MOUTH TWICE DAILY  FOR 1 WEEK, THEN TAKE ONE TABLET BY MOUTH TWICE DAILY 07/25/21   Gwynneth Munson, Sung Amabile, PA-C  metFORMIN (GLUCOPHAGE) 1000 MG tablet Take 500-1,000 mg by mouth See admin instructions. Take1000 mg in the morning and 500 mg in the evening 08/16/17   [provider]  Omega-3 Fatty Acids (FISH OIL) 500 MG CAPS Take 500 mg by mouth every evening.    [provider]  rosuvastatin (CRESTOR) 40 MG tablet Take 1 tablet (40 mg total) by mouth daily at 6 PM. 10/24/17   Purohit, Salli Quarry, MD  tolterodine (DETROL LA) 4 MG 24 hr capsule Take 4 mg by mouth daily. 03/30/20   [provider]  Vibegron (GEMTESA) 75 MG TABS Take 75 mg by mouth daily.    [provider]    Physical Exam: Vitals:   10/29/22 2222 10/29/22 2326 10/30/22 0013 10/30/22 0100  BP:  125/63 (!) 151/93   Pulse:  63 67   Resp:  16 16   Temp:  98.3 F (36.8 C) 98.2 F (36.8 C)   TempSrc:  Oral Oral   SpO2: 100% 100% 100%   Weight:    74.8 kg  Height:    5\' 6"  (1.676 m)    Constitutional: NAD, calm  Eyes: PERTLA, lids and conjunctivae normal ENMT: Mucous membranes are moist. Posterior pharynx clear of any exudate or lesions.   Neck: supple, no masses  Respiratory: no wheezing, no crackles. No accessory muscle use.  Cardiovascular: S1 & S2 heard, regular rate and rhythm. No extremity edema.   Abdomen: No distension, no tenderness, soft. Bowel sounds active.  Musculoskeletal: no clubbing / cyanosis. No joint deformity upper and lower extremities.   Skin: no significant rashes, lesions, ulcers. Warm, dry, well-perfused. Neurologic: Expressive aphasia. No gross facial asymmetry. PERRL. LUE and LLE weakness relative to right. Alert and oriented to person, place, and situation.  Psychiatric: Pleasant. Cooperative.    Labs and Imaging on Admission: I have personally reviewed following labs and imaging studies  CBC: Recent Labs  Lab 10/29/22 1647  WBC 5.3  NEUTROABS 3.2  HGB 12.7*  HCT 40.8  MCV 94.7  PLT  226   Basic Metabolic Panel: Recent Labs  Lab 10/29/22 1647  NA 143  K 3.9  CL 111  CO2 22  GLUCOSE 108*  BUN 18  CREATININE 1.70*  CALCIUM 9.0  MG 2.3   GFR: Estimated Creatinine Clearance: 34.4 mL/min (A) (by C-G formula based on SCr of 1.7 mg/dL (H)). Liver Function Tests: Recent Labs  Lab 10/29/22 1647  AST 35  ALT 22  ALKPHOS 97  BILITOT 0.7  PROT 6.7  ALBUMIN 3.8   No results for input(s): "LIPASE", "AMYLASE" in the last 168 hours. No results for input(s): "AMMONIA" in the last 168 hours. Coagulation Profile: No results for input(s): "INR", "PROTIME" in the last 168 hours. Cardiac Enzymes:  No results for input(s): "CKTOTAL", "CKMB", "CKMBINDEX", "TROPONINI" in the last 168 hours. BNP (last 3 results) No results for input(s): "PROBNP" in the last 8760 hours. HbA1C: No results for input(s): "HGBA1C" in the last 72 hours. CBG: Recent Labs  Lab 10/29/22 1645 10/30/22 0026  GLUCAP 102* 126*   Lipid Profile: No results for input(s): "CHOL", "HDL", "LDLCALC", "TRIG", "CHOLHDL", "LDLDIRECT" in the last 72 hours. Thyroid Function Tests: No results for input(s): "TSH", "T4TOTAL", "FREET4", "T3FREE", "THYROIDAB" in the last 72 hours. Anemia Panel: No results for input(s): "VITAMINB12", "FOLATE", "FERRITIN", "TIBC", "IRON", "RETICCTPCT" in the last 72 hours. Urine analysis:    Component Value Date/Time   COLORURINE YELLOW 10/29/2022 2125   APPEARANCEUR CLEAR 10/29/2022 2125   LABSPEC 1.018 10/29/2022 2125   PHURINE 5.0 10/29/2022 2125   GLUCOSEU >=500 (A) 10/29/2022 2125   HGBUR NEGATIVE 10/29/2022 2125   BILIRUBINUR NEGATIVE 10/29/2022 2125   KETONESUR NEGATIVE 10/29/2022 2125   PROTEINUR NEGATIVE 10/29/2022 2125   UROBILINOGEN 0.2 12/07/2013 0032   NITRITE NEGATIVE 10/29/2022 2125   LEUKOCYTESUR NEGATIVE 10/29/2022 2125   Sepsis Labs: @LABRCNTIP (procalcitonin:4,lacticidven:4) )No results found for this or any previous visit (from the past 240  hour(s)).   Radiological Exams on Admission: MR BRAIN WO CONTRAST  Result Date: 10/29/2022 CLINICAL DATA:  Frequent falls.  Acute neurologic deficit. EXAM: MRI HEAD WITHOUT CONTRAST TECHNIQUE: Multiplanar, multiecho pulse sequences of the brain and surrounding structures were obtained without intravenous contrast. COMPARISON:  02/27/2022 FINDINGS: Brain: There is a small acute infarct within the right mid brain, within the periaqueductal gray matter. There are innumerable chronic microhemorrhages throughout the brain in a mixed central and peripheral distribution. No acute hemorrhage. There is confluent hyperintense T2-weighted signal within the white matter. Generalized volume loss. The midline structures are normal. Unchanged small meningioma, measuring approximately 9 mm, at the lateral inferior surface of the left tentorial leaflet. Vascular: Normal flow voids Skull and upper cervical spine: No acute finding Sinuses/Orbits: Paranasal sinuses are clear. Bilateral ocular lens replacements. Other: None IMPRESSION: 1. Small acute infarct within the right mid brain, within the periaqueductal gray matter. No hemorrhage or mass effect. 2. Innumerable chronic microhemorrhages throughout the brain in a mixed central and peripheral distribution, concerning for cerebral amyloid angiopathy. Electronically Signed   By: Deatra Robinson M.D.   On: 10/29/2022 23:03   DG Chest Port 1 View  Result Date: 10/29/2022 CLINICAL DATA:  Altered mental status EXAM: PORTABLE CHEST 1 VIEW COMPARISON:  None Available. FINDINGS: Lungs are well expanded, symmetric, and clear. No pneumothorax or pleural effusion. Cardiac size within normal limits. Pulmonary vascularity is normal. Osseous structures are age-appropriate. No acute bone abnormality. IMPRESSION: No active disease. Electronically Signed   By: Helyn Numbers M.D.   On: 10/29/2022 19:52   CT Head Wo Contrast  Result Date: 10/29/2022 CLINICAL DATA:  Fall EXAM: CT HEAD WITHOUT  CONTRAST TECHNIQUE: Contiguous axial images were obtained from the base of the skull through the vertex without intravenous contrast. RADIATION DOSE REDUCTION: This exam was performed according to the departmental dose-optimization program which includes automated exposure control, adjustment of the mA and/or kV according to patient size and/or use of iterative reconstruction technique. COMPARISON:  03/11/2021 FINDINGS: Brain: There is no mass, hemorrhage or extra-axial collection. There is generalized atrophy without lobar predilection. Hypodensity of the white matter is most commonly associated with chronic microvascular disease. Vascular: Atherosclerotic calcification of the vertebral and internal carotid arteries at the skull base. No abnormal hyperdensity of the major intracranial arteries  or dural venous sinuses. Skull: The visualized skull base, calvarium and extracranial soft tissues are normal. Sinuses/Orbits: No fluid levels or advanced mucosal thickening of the visualized paranasal sinuses. No mastoid or middle ear effusion. The orbits are normal. IMPRESSION: 1. No acute intracranial abnormality. 2. Generalized atrophy and findings of chronic microvascular disease. Electronically Signed   By: Deatra Robinson M.D.   On: 10/29/2022 19:39    EKG: Independently reviewed. Sinus rhythm.   Assessment/Plan   1. Acute ischemic CVA  - Continue cardiac monitoring and frequent neuro checks, check A1c, lipids, CTA head & neck, and echocardiogram, consult PT/OT/SLP, follow-up on neurology recommendations regarding antiplatelets   2. Type II DM  - A1c was 6.4% in January 2023  - Update A1c, check CBGs, use low-intensity SSI for now  3. Hypertension  - Permit hypertension in acute-phase of ischemic CVA    4. CKD 3A - SCr is 1.70 on admission; baseline may be closer to 1.4  - He was given 2 liters NS in ED  - Renally-dose medications, repeat chem panel in am   5. Dementia  - Delirium precautions      DVT prophylaxis: SCDs  Code Status: Full  Level of Care: Level of care: Telemetry Medical Family Communication: wife at bedside  Disposition Plan:  Patient is from: home  Anticipated d/c is to: TBD Anticipated d/c date is: 9/10 or 10/31/22  Patient currently: Pending CVA workup  Consults called: Neurology  Admission status: Observation     Briscoe Deutscher, MD [facility]  10/30/2022, 2:04 AM

## 2022-10-30 NOTE — Progress Notes (Signed)
? ?  Inpatient Rehab Admissions Coordinator : ? ?Per therapy recommendations, patient was screened for CIR candidacy by Barbara Boyette RN MSN.  At this time patient appears to be a potential candidate for CIR. I will place a rehab consult per protocol for full assessment. Please call me with any questions. ? ?Barbara Boyette RN MSN ?Admissions Coordinator ?[phone] ?  ?

## 2022-10-30 NOTE — Plan of Care (Signed)

## 2022-10-30 NOTE — Plan of Care (Signed)
  Problem: Health Behavior/Discharge Planning: Goal: Ability to manage health-related needs will improve Outcome: Progressing   Problem: Metabolic: Goal: Ability to maintain appropriate glucose levels will improve Outcome: Progressing   Problem: Ischemic Stroke/TIA Tissue Perfusion: Goal: Complications of ischemic stroke/TIA will be minimized Outcome: Progressing   Problem: Self-Care: Goal: Ability to communicate needs accurately will improve Outcome: Progressing

## 2022-10-30 NOTE — Evaluation (Signed)
Physical Therapy Evaluation Patient Details Name: James Henderson MRN: [id number] DOB: [birth year] Today's Date: 10/30/2022  History of Present Illness  Pt is a 72 y/o M admitted on 10/29/22 after presenting with c/o recurrent falls & increased L sided weakness. Pt was found to have R midbrain stroke. PMH: DM2, HTN, HLD, CVA with residual aphasia & L sided weakness, cerebral meningioma, glaucoma  Clinical Impression  Pt seen for PT evaluation with pt agreeable to tx. Pt reports prior to admission he ambulated short distances in the home with rollator, without physical assistance from family, although pt has 24 hr supervision. On this date pt presents with LUE/LLE weakness, noting this is more than baseline. Pt requires min<>mod assist to upright trunk during supine>sit, min assist STS, min assist for short distance gait with RW. Pt is limited by urinary incontinence, which pt reports is not baseline.  Recommend ongoing PT services to maximize independence & reduce fall risk.       If plan is discharge home, recommend the following: A little help with walking and/or transfers;A little help with bathing/dressing/bathroom;Assistance with cooking/housework;Assist for transportation;Help with stairs or ramp for entrance   Can travel by private vehicle   Yes    Equipment Recommendations Rolling walker (2 wheels);BSC/3in1  Recommendations for Other Services       Functional Status Assessment Patient has had a recent decline in their functional status and demonstrates the ability to make significant improvements in function in a reasonable and predictable amount of time.     Precautions / Restrictions Precautions Precautions: Fall Precaution Comments: L hemi Restrictions Weight Bearing Restrictions: No      Mobility  Bed Mobility Overal bed mobility: Needs Assistance Bed Mobility: Supine to Sit     Supine to sit: Min assist, Mod assist, HOB elevated, Used rails     General bed mobility  comments: Pt is able to transfer BLE to EOB but requires min<>mod assist to upright trunk.    Transfers Overall transfer level: Needs assistance Equipment used: Rolling walker (2 wheels) Transfers: Sit to/from Stand Sit to Stand: Min assist           General transfer comment: STS from EOB & recliner, extra time to power up to standing    Ambulation/Gait Ambulation/Gait assistance: Min assist Gait Distance (Feet): 6 Feet Assistive device: Rolling walker (2 wheels) Gait Pattern/deviations: Decreased step length - right, Decreased step length - left, Decreased dorsiflexion - left, Decreased stride length Gait velocity: decreased     General Gait Details: Pt ambulates ~4 ft forwards with RW, 4 ft backwards with min assist 2/2 incontinent void & need to return to recliner. During short distance gait, pt requires cuing to ambulate within base of AD. Pt with decreased LLE hip/knee flexion during swing phase, decreased L foot clearance (pt dragging foot), with absent dorsiflexion & heel strike.  Stairs            Wheelchair Mobility     Tilt Bed    Modified Rankin (Stroke Patients Only)       Balance Overall balance assessment: Needs assistance Sitting-balance support: Bilateral upper extremity supported, Feet supported Sitting balance-Leahy Scale: Fair     Standing balance support: Bilateral upper extremity supported, During functional activity, Reliant on assistive device for balance Standing balance-Leahy Scale: Poor                               Pertinent Vitals/Pain Pain Assessment Pain  Assessment: No/denies pain    Home Living Family/patient expects to be discharged to:: Private residence Living Arrangements: Spouse/significant other Available Help at Discharge: Family;Available 24 hours/day (reports his wife & son help him but they both work, notes he has 2 more children & reports he's never home alone) Type of Home: House Home Access: Ramped  entrance       Home Layout: One level Home Equipment: Rollator (4 wheels);Cane - single point      Prior Function Prior Level of Function : Needs assist             Mobility Comments: Pt reports he ambulates with rollator without physical assistance, notes 4 falls in the day prior to admission. ADLs Comments: reports indep     Extremity/Trunk Assessment   Upper Extremity Assessment Upper Extremity Assessment: Defer to OT evaluation;LUE deficits/detail LUE Deficits / Details: LUE weakness 2/2 hx of CVA, exacerbated LUE Coordination: decreased fine motor    Lower Extremity Assessment Lower Extremity Assessment: LLE deficits/detail LLE Deficits / Details: drags LLE during gait, decreased fine motor    Cervical / Trunk Assessment Cervical / Trunk Assessment: Kyphotic  Communication   Communication Communication: Difficulty communicating thoughts/reduced clarity of speech (extra time required to communicate clearly)  Cognition Arousal: Alert Behavior During Therapy: WFL for tasks assessed/performed Overall Cognitive Status: No family/caregiver present to determine baseline cognitive functioning                                 General Comments: Pt pleasant, follows simple commands throughout session, is incontinent of urine & reports this is new.        General Comments General comments (skin integrity, edema, etc.): PT received in bed, incontinent of urine & with incontinent urinary episode during gait but reports this is not baseline. PT assisted pt with getting cleaned up & changing into a clean gown. Pt also with c/o intermittent diplopia when looking up/to the R at TV - OT made aware.    Exercises     Assessment/Plan    PT Assessment Patient needs continued PT services  PT Problem List Decreased strength;Decreased coordination;Pain;Decreased cognition;Decreased range of motion;Decreased knowledge of use of DME;Decreased activity tolerance;Decreased  balance;Decreased safety awareness;Decreased mobility;Decreased knowledge of precautions       PT Treatment Interventions DME instruction;Balance training;Modalities;Gait training;Neuromuscular re-education;Stair training;Cognitive remediation;Functional mobility training;Patient/family education;Therapeutic exercise;Manual techniques;Therapeutic activities    PT Goals (Current goals can be found in the Care Plan section)  Acute Rehab PT Goals Patient Stated Goal: get better PT Goal Formulation: With patient Time For Goal Achievement: 11/13/22 Potential to Achieve Goals: Good    Frequency Min 1X/week     Co-evaluation               AM-PAC PT "6 Clicks" Mobility  Outcome Measure Help needed turning from your back to your side while in a flat bed without using bedrails?: A Little Help needed moving from lying on your back to sitting on the side of a flat bed without using bedrails?: A Lot Help needed moving to and from a bed to a chair (including a wheelchair)?: A Little Help needed standing up from a chair using your arms (e.g., wheelchair or bedside chair)?: A Little Help needed to walk in hospital room?: A Little Help needed climbing 3-5 steps with a railing? : A Lot 6 Click Score: 16    End of Session   Activity Tolerance:  (  limited 2/2 incontinent void & handoff to OT) Patient left: in chair (in handoff to OT)   PT Visit Diagnosis: Unsteadiness on feet (R26.81);Other abnormalities of gait and mobility (R26.89);Difficulty in walking, not elsewhere classified (R26.2);Muscle weakness (generalized) (M62.81);Hemiplegia and hemiparesis Hemiplegia - Right/Left: Left Hemiplegia - caused by: Cerebral infarction    Time: 1610-9604 PT Time Calculation (min) (ACUTE ONLY): 22 min   Charges:   PT Evaluation $PT Eval Low Complexity: 1 Low   PT General Charges $$ ACUTE PT VISIT: 1 Visit         Aleda Grana, PT, DPT 10/30/22, 11:27 AM   Sandi Mariscal 10/30/2022, 11:26 AM

## 2022-10-30 NOTE — Progress Notes (Signed)
PROGRESS NOTE    James Henderson  WUJ:[id number] DOB: 01-18-1951 DOA: 10/29/2022 PCP: [facility]    Brief Narrative:   James Henderson is a pleasant 72 y.o. male with medical history significant for type 2 diabetes mellitus, hypertension, hyperlipidemia, and history of CVA with residual aphasia and left-sided weakness presented to the ED with recurrent falls total of 4 in 24 hours with  increased left-sided weakness.  Normally patient is able to ambulate with a walker.  In the ED patient had stable vitals.  EKG showed normal sinus rhythm.  CT scan of the head negative for acute abnormality.  MRI of the brain showed acute infarct in the right midbrain as well as innumerable chronic microhemorrhages throughout the brain.  Lab was notable for creatinine of 1.7.  Neurology was consulted from the ED and patient was admitted hospital for further evaluation and treatment.    Assessment/Plan    Acute ischemic CVA  History of previous stroke. Continue stroke protocol.  Lipid panel with LDL of 35.  Hemoglobin A1c of 7.2., CTA head & neck was negative for emergent large vessel occlusion.  Had left PCA P1 P2 string sign stenosis.   2D echocardiogram pending.  Neurology on board and patient has been started on aspirin and Plavix.  Will follow neurology recommendation.  Patient does have baseline weakness from previous stroke and uses walker at home. PT/OT seen the patient and recommend CIR.     Type II DM  Hemoglobin A1c of 7.2 at this time was 6.4 in January 2023.  Continue sliding scale insulin.  Diabetic diet when p.o. able.  Closely monitor.   Essential hypertension  Currently on permissive hypertension.   Mild AKI on CKD 3a - SCr is 1.70 on admission; baseline may be closer to 1.4.  Creatinine today at 1.5.  Received IV fluids.  Will continue to monitor.    Dementia  - Delirium precautions        DVT prophylaxis: SCD's Start: 10/29/22 2325   Code Status:      Code Status: Full Code  Disposition: CIR as per PT recommendation.  Status is: Observation  The patient will require care spanning > 2 midnights and should be moved to inpatient because: Need for CIR, stroke workup,   Family Communication: Spoke with the patient's son at bedside  Consultants:  Neurology  Procedures:  None  Antimicrobials:  None  Anti-infectives (From admission, onward)    None      Subjective: Today, patient was seen and examined at bedside.  Patient's family at bedside.  Patient denies any pain, nausea, vomiting has left upper extremity weakness.  Objective: Vitals:   10/30/22 0100 10/30/22 0424 10/30/22 0715 10/30/22 1203  BP:  122/71 127/75 133/76  Pulse:  60 (!) 57 64  Resp:  16 18 18   Temp:  100 F (37.8 C) 98 F (36.7 C) 99 F (37.2 C)  TempSrc:  Axillary Oral Oral  SpO2:  99% 100% 99%  Weight: 74.8 kg     Height: 5\' 6"  (1.676 m)       Intake/Output Summary (Last 24 hours) at 10/30/2022 1312 Last data filed at 10/30/2022 0500 Gross per 24 hour  Intake 240 ml  Output --  Net 240 ml   Filed Weights   10/29/22 1641 10/30/22 0100  Weight: 77.1 kg 74.8 kg    Physical Examination: Body mass index is 26.62 kg/m.   General:  Average built, not in obvious distress HENT:  No scleral pallor or icterus noted. Oral mucosa is moist.  Chest:   Diminished breath sounds bilaterally. No crackles or wheezes.  CVS: S1 &S2 heard. No murmur.  Regular rate and rhythm. Abdomen: Soft, nontender, nondistended.  Bowel sounds are heard.   Extremities: No cyanosis, clubbing or edema.   Psych: Alert, awake and oriented, normal mood CNS: Slurred speech.  Expressive aphasia.  Left upper and lower extremity weakness Skin: Warm and dry.  No rashes noted.  Data Reviewed:   CBC: Recent Labs  Lab 10/29/22 1647 10/30/22 0539  WBC 5.3 4.8  NEUTROABS 3.2  --   HGB 12.7* 11.0*  HCT 40.8 35.2*  MCV 94.7 92.9  PLT 226 210    Basic Metabolic  Panel: Recent Labs  Lab 10/29/22 1647 10/30/22 0539  NA 143 142  K 3.9 3.5  CL 111 107  CO2 22 24  GLUCOSE 108* 123*  BUN 18 18  CREATININE 1.70* 1.51*  CALCIUM 9.0 8.9  MG 2.3  --     Liver Function Tests: Recent Labs  Lab 10/29/22 1647  AST 35  ALT 22  ALKPHOS 97  BILITOT 0.7  PROT 6.7  ALBUMIN 3.8     Radiology Studies: CT ANGIO HEAD NECK W WO CM  Result Date: 10/30/2022 CLINICAL DATA:  72 year old male with neurologic deficit, frequent falls. Right midbrain infarct on MRI yesterday. History of severe left PCA stenosis. EXAM: CT ANGIOGRAPHY HEAD AND NECK WITH AND WITHOUT CONTRAST TECHNIQUE: Multidetector CT imaging of the head and neck was performed using the standard protocol during bolus administration of intravenous contrast. Multiplanar CT image reconstructions and MIPs were obtained to evaluate the vascular anatomy. Carotid stenosis measurements (when applicable) are obtained utilizing NASCET criteria, using the distal internal carotid diameter as the denominator. RADIATION DOSE REDUCTION: This exam was performed according to the departmental dose-optimization program which includes automated exposure control, adjustment of the mA and/or kV according to patient size and/or use of iterative reconstruction technique. CONTRAST:  75mL OMNIPAQUE IOHEXOL 350 MG/ML SOLN COMPARISON:  Brain MRI yesterday. Prior CTA head and neck 03/11/2021. FINDINGS: CTA NECK Skeleton: Mostly absent dentition. Widespread advanced cervical spine degeneration. No acute osseous abnormality identified. Upper chest: Stable, negative. Other neck: No acute finding. Aortic arch: Calcified aortic atherosclerosis.  3 vessel arch. Right carotid system: Negative brachiocephalic artery and right CCA origin. Tortuous proximal right CCA. Minimal plaque at the right carotid bifurcation. No stenosis to the skull base. Left carotid system: Similar tortuosity and minimal plaque. No left carotid stenosis to the skull  base. Vertebral arteries: Calcified right subclavian artery origin without stenosis. Normal right vertebral artery origin. Patent right vertebral artery with tortuosity to the skull base. No significant plaque or stenosis. Normal proximal left subclavian artery and left vertebral artery origin. Codominant, patent left vertebral artery to the skull base without significant plaque or stenosis. CTA HEAD Posterior circulation: Patent distal vertebral arteries and vertebrobasilar junction without plaque or stenosis. Patent PICA, AICA origins. Patent basilar artery without stenosis. Patent SCA and PCA origins. Chronic PCA irregularity bilaterally, moderate to severe in the left distal P1 and P2 segments which demonstrate string sign stenosis (series 11, image 22) which is stable from the CTA last year. Distal left P2, and other left PCA branches remain patent and appear more normal. No high-grade right PCA stenosis. Anterior circulation: Both ICA siphons are patent. Distal left siphon calcified plaque is mild without stenosis. Right siphon cavernous and supraclinoid calcified plaque with mild right supraclinoid ICA stenosis.  Patent carotid termini. Posterior communicating arteries are diminutive or absent. Patent MCA and ACA origins with dominant right and diminutive left A1 segments. Normal anterior communicating artery. Bilateral ACA branches are stable and within normal limits. Left MCA M1 segment and bifurcation are patent without stenosis. Right MCA M1 segment and bifurcation are patent without stenosis. Bilateral MCA branches are stable. And there is moderate to severe right M3 or M4 stenosis on series 13, image 13. Venous sinuses: Early contrast timing, not evaluated. Anatomic variants: Dominant right ACA A1. Review of the MIP images confirms the above findings IMPRESSION: 1. Negative for emergent large vessel occlusion. Positive for stable chronically severe stenoses of the: - Left PCA P1/P2 (string sign  stenosis). - Right MCA posterior M3/M4 branch. 2. Mild ICA siphon calcified plaque with mild right supraclinoid ICA stenosis. Minimal extracranial atherosclerosis, no significant extracranial stenosis. 3.  Aortic Atherosclerosis (ICD10-I70.0). Electronically Signed   By: Odessa Fleming M.D.   On: 10/30/2022 08:16   MR BRAIN WO CONTRAST  Result Date: 10/29/2022 CLINICAL DATA:  Frequent falls.  Acute neurologic deficit. EXAM: MRI HEAD WITHOUT CONTRAST TECHNIQUE: Multiplanar, multiecho pulse sequences of the brain and surrounding structures were obtained without intravenous contrast. COMPARISON:  02/27/2022 FINDINGS: Brain: There is a small acute infarct within the right mid brain, within the periaqueductal gray matter. There are innumerable chronic microhemorrhages throughout the brain in a mixed central and peripheral distribution. No acute hemorrhage. There is confluent hyperintense T2-weighted signal within the white matter. Generalized volume loss. The midline structures are normal. Unchanged small meningioma, measuring approximately 9 mm, at the lateral inferior surface of the left tentorial leaflet. Vascular: Normal flow voids Skull and upper cervical spine: No acute finding Sinuses/Orbits: Paranasal sinuses are clear. Bilateral ocular lens replacements. Other: None IMPRESSION: 1. Small acute infarct within the right mid brain, within the periaqueductal gray matter. No hemorrhage or mass effect. 2. Innumerable chronic microhemorrhages throughout the brain in a mixed central and peripheral distribution, concerning for cerebral amyloid angiopathy. Electronically Signed   By: Deatra Robinson M.D.   On: 10/29/2022 23:03   DG Chest Port 1 View  Result Date: 10/29/2022 CLINICAL DATA:  Altered mental status EXAM: PORTABLE CHEST 1 VIEW COMPARISON:  None Available. FINDINGS: Lungs are well expanded, symmetric, and clear. No pneumothorax or pleural effusion. Cardiac size within normal limits. Pulmonary vascularity is normal.  Osseous structures are age-appropriate. No acute bone abnormality. IMPRESSION: No active disease. Electronically Signed   By: Helyn Numbers M.D.   On: 10/29/2022 19:52   CT Head Wo Contrast  Result Date: 10/29/2022 CLINICAL DATA:  Fall EXAM: CT HEAD WITHOUT CONTRAST TECHNIQUE: Contiguous axial images were obtained from the base of the skull through the vertex without intravenous contrast. RADIATION DOSE REDUCTION: This exam was performed according to the departmental dose-optimization program which includes automated exposure control, adjustment of the mA and/or kV according to patient size and/or use of iterative reconstruction technique. COMPARISON:  03/11/2021 FINDINGS: Brain: There is no mass, hemorrhage or extra-axial collection. There is generalized atrophy without lobar predilection. Hypodensity of the white matter is most commonly associated with chronic microvascular disease. Vascular: Atherosclerotic calcification of the vertebral and internal carotid arteries at the skull base. No abnormal hyperdensity of the major intracranial arteries or dural venous sinuses. Skull: The visualized skull base, calvarium and extracranial soft tissues are normal. Sinuses/Orbits: No fluid levels or advanced mucosal thickening of the visualized paranasal sinuses. No mastoid or middle ear effusion. The orbits are normal. IMPRESSION: 1. No  acute intracranial abnormality. 2. Generalized atrophy and findings of chronic microvascular disease. Electronically Signed   By: Deatra Robinson M.D.   On: 10/29/2022 19:39      LOS: 0 days    Joycelyn Das, MD [facility] Available via Epic secure chat 7am-7pm After these hours, please refer to coverage provider listed on [email or web address] 10/30/2022, 1:12 PM

## 2022-10-30 NOTE — Progress Notes (Addendum)
STROKE TEAM PROGRESS NOTE   BRIEF HPI Mr. James Henderson is a 72 y.o. male with PMH significant for prior strokes, DM2, HTN, HLD, prior meningioma who presents with increased falls along with dragging his left leg. His neurologic examination was notable for mild to moderate aphasia, dysarthria, dysconjugate gaze and L sided weakness and incoordination. He was found to have a small R midbrain stroke. CTA negative.   SIGNIFICANT HOSPITAL EVENTS 9/9: MRI shows R midbrain stroke, chronic microhemorrhages 9/10:  PT/OT recommend CIR  INTERIM HISTORY/SUBJECTIVE Patient sitting up in chair.  Family at bedside. Discussed plan of care and assessment.   Some chronic weakness from previous stroke, uses walker at home.  On exam, slurred speech, LUE ataxia/stiffness, L hemiplegia.   Patient lives with wife, son and daughter-in-law and is never home alone. Discharge planning for CIR.    OBJECTIVE  CBC    Component Value Date/Time   WBC 4.8 10/30/2022 0539   RBC 3.79 (L) 10/30/2022 0539   HGB 11.0 (L) 10/30/2022 0539   HCT 35.2 (L) 10/30/2022 0539   PLT 210 10/30/2022 0539   MCV 92.9 10/30/2022 0539   MCH 29.0 10/30/2022 0539   MCHC 31.3 10/30/2022 0539   RDW 13.2 10/30/2022 0539   LYMPHSABS 1.6 10/29/2022 1647   MONOABS 0.4 10/29/2022 1647   EOSABS 0.0 10/29/2022 1647   BASOSABS 0.0 10/29/2022 1647    BMET    Component Value Date/Time   NA 142 10/30/2022 0539   K 3.5 10/30/2022 0539   CL 107 10/30/2022 0539   CO2 24 10/30/2022 0539   GLUCOSE 123 (H) 10/30/2022 0539   BUN 18 10/30/2022 0539   CREATININE 1.51 (H) 10/30/2022 0539   CALCIUM 8.9 10/30/2022 0539   GFRNONAA 48 (L) 10/30/2022 0539    IMAGING past 24 hours CT ANGIO HEAD NECK W WO CM  Result Date: 10/30/2022 CLINICAL DATA:  72 year old male with neurologic deficit, frequent falls. Right midbrain infarct on MRI yesterday. History of severe left PCA stenosis. EXAM: CT ANGIOGRAPHY HEAD AND NECK WITH AND WITHOUT CONTRAST  TECHNIQUE: Multidetector CT imaging of the head and neck was performed using the standard protocol during bolus administration of intravenous contrast. Multiplanar CT image reconstructions and MIPs were obtained to evaluate the vascular anatomy. Carotid stenosis measurements (when applicable) are obtained utilizing NASCET criteria, using the distal internal carotid diameter as the denominator. RADIATION DOSE REDUCTION: This exam was performed according to the departmental dose-optimization program which includes automated exposure control, adjustment of the mA and/or kV according to patient size and/or use of iterative reconstruction technique. CONTRAST:  75mL OMNIPAQUE IOHEXOL 350 MG/ML SOLN COMPARISON:  Brain MRI yesterday. Prior CTA head and neck 03/11/2021. FINDINGS: CTA NECK Skeleton: Mostly absent dentition. Widespread advanced cervical spine degeneration. No acute osseous abnormality identified. Upper chest: Stable, negative. Other neck: No acute finding. Aortic arch: Calcified aortic atherosclerosis.  3 vessel arch. Right carotid system: Negative brachiocephalic artery and right CCA origin. Tortuous proximal right CCA. Minimal plaque at the right carotid bifurcation. No stenosis to the skull base. Left carotid system: Similar tortuosity and minimal plaque. No left carotid stenosis to the skull base. Vertebral arteries: Calcified right subclavian artery origin without stenosis. Normal right vertebral artery origin. Patent right vertebral artery with tortuosity to the skull base. No significant plaque or stenosis. Normal proximal left subclavian artery and left vertebral artery origin. Codominant, patent left vertebral artery to the skull base without significant plaque or stenosis. CTA HEAD Posterior circulation: Patent distal vertebral arteries  and vertebrobasilar junction without plaque or stenosis. Patent PICA, AICA origins. Patent basilar artery without stenosis. Patent SCA and PCA origins. Chronic PCA  irregularity bilaterally, moderate to severe in the left distal P1 and P2 segments which demonstrate string sign stenosis (series 11, image 22) which is stable from the CTA last year. Distal left P2, and other left PCA branches remain patent and appear more normal. No high-grade right PCA stenosis. Anterior circulation: Both ICA siphons are patent. Distal left siphon calcified plaque is mild without stenosis. Right siphon cavernous and supraclinoid calcified plaque with mild right supraclinoid ICA stenosis. Patent carotid termini. Posterior communicating arteries are diminutive or absent. Patent MCA and ACA origins with dominant right and diminutive left A1 segments. Normal anterior communicating artery. Bilateral ACA branches are stable and within normal limits. Left MCA M1 segment and bifurcation are patent without stenosis. Right MCA M1 segment and bifurcation are patent without stenosis. Bilateral MCA branches are stable. And there is moderate to severe right M3 or M4 stenosis on series 13, image 13. Venous sinuses: Early contrast timing, not evaluated. Anatomic variants: Dominant right ACA A1. Review of the MIP images confirms the above findings IMPRESSION: 1. Negative for emergent large vessel occlusion. Positive for stable chronically severe stenoses of the: - Left PCA P1/P2 (string sign stenosis). - Right MCA posterior M3/M4 branch. 2. Mild ICA siphon calcified plaque with mild right supraclinoid ICA stenosis. Minimal extracranial atherosclerosis, no significant extracranial stenosis. 3.  Aortic Atherosclerosis (ICD10-I70.0). Electronically Signed   By: Odessa Fleming M.D.   On: 10/30/2022 08:16   MR BRAIN WO CONTRAST  Result Date: 10/29/2022 CLINICAL DATA:  Frequent falls.  Acute neurologic deficit. EXAM: MRI HEAD WITHOUT CONTRAST TECHNIQUE: Multiplanar, multiecho pulse sequences of the brain and surrounding structures were obtained without intravenous contrast. COMPARISON:  02/27/2022 FINDINGS: Brain: There is  a small acute infarct within the right mid brain, within the periaqueductal gray matter. There are innumerable chronic microhemorrhages throughout the brain in a mixed central and peripheral distribution. No acute hemorrhage. There is confluent hyperintense T2-weighted signal within the white matter. Generalized volume loss. The midline structures are normal. Unchanged small meningioma, measuring approximately 9 mm, at the lateral inferior surface of the left tentorial leaflet. Vascular: Normal flow voids Skull and upper cervical spine: No acute finding Sinuses/Orbits: Paranasal sinuses are clear. Bilateral ocular lens replacements. Other: None IMPRESSION: 1. Small acute infarct within the right mid brain, within the periaqueductal gray matter. No hemorrhage or mass effect. 2. Innumerable chronic microhemorrhages throughout the brain in a mixed central and peripheral distribution, concerning for cerebral amyloid angiopathy. Electronically Signed   By: Deatra Robinson M.D.   On: 10/29/2022 23:03   DG Chest Port 1 View  Result Date: 10/29/2022 CLINICAL DATA:  Altered mental status EXAM: PORTABLE CHEST 1 VIEW COMPARISON:  None Available. FINDINGS: Lungs are well expanded, symmetric, and clear. No pneumothorax or pleural effusion. Cardiac size within normal limits. Pulmonary vascularity is normal. Osseous structures are age-appropriate. No acute bone abnormality. IMPRESSION: No active disease. Electronically Signed   By: Helyn Numbers M.D.   On: 10/29/2022 19:52   CT Head Wo Contrast  Result Date: 10/29/2022 CLINICAL DATA:  Fall EXAM: CT HEAD WITHOUT CONTRAST TECHNIQUE: Contiguous axial images were obtained from the base of the skull through the vertex without intravenous contrast. RADIATION DOSE REDUCTION: This exam was performed according to the departmental dose-optimization program which includes automated exposure control, adjustment of the mA and/or kV according to patient size and/or use  of iterative  reconstruction technique. COMPARISON:  03/11/2021 FINDINGS: Brain: There is no mass, hemorrhage or extra-axial collection. There is generalized atrophy without lobar predilection. Hypodensity of the white matter is most commonly associated with chronic microvascular disease. Vascular: Atherosclerotic calcification of the vertebral and internal carotid arteries at the skull base. No abnormal hyperdensity of the major intracranial arteries or dural venous sinuses. Skull: The visualized skull base, calvarium and extracranial soft tissues are normal. Sinuses/Orbits: No fluid levels or advanced mucosal thickening of the visualized paranasal sinuses. No mastoid or middle ear effusion. The orbits are normal. IMPRESSION: 1. No acute intracranial abnormality. 2. Generalized atrophy and findings of chronic microvascular disease. Electronically Signed   By: Deatra Robinson M.D.   On: 10/29/2022 19:39    Vitals:   10/30/22 0013 10/30/22 0100 10/30/22 0424 10/30/22 0715  BP: (!) 151/93  122/71 127/75  Pulse: 67  60 (!) 57  Resp: 16  16 18   Temp: 98.2 F (36.8 C)  100 F (37.8 C) 98 F (36.7 C)  TempSrc: Oral  Axillary Oral  SpO2: 100%  99% 100%  Weight:  74.8 kg    Height:  5\' 6"  (1.676 m)       PHYSICAL EXAM General: Frail elderly African American male in no acute distress Psych:  Mood and affect appropriate for situation CV: Regular rate and rhythm on monitor Respiratory:  Regular, unlabored respirations on room air GI: Abdomen soft and nontender   NEURO:  Mental Status: AA&Ox3, patient is able to give clear and coherent history Speech/Language: speech is slurred with pseudobulbar dysarthria.  Naming, repetition, fluency, and comprehension intact.  Cranial Nerves:  II: PERRL. Visual fields full.  III, IV, VI: EOMI. Eyelids elevate symmetrically.  V: Sensation is intact to light touch and symmetrical to face.  VII: Face is symmetrical resting and smiling VIII: hearing intact to voice. IX, X:  Palate elevates symmetrically. Phonation is normal.  LO:VFIEPPIR shrug 5/5. XII: tongue is midline without fasciculations. Motor: 4/5 LUE, LLE. 4-/5 grip. 4+/5 Left dorsal and plantar flexion.  Weakness of left grip and nonfixed flexion contractures of the left hand fingers.  Mild weakness of left hip flexors and ankle dorsiflexors.  Tone: is increased on the left with mild spasticity.  Sensation- Intact to light touch bilaterally. Extinction absent to light touch to DSS.   Coordination: FTN with ataxia on left. RAM slow on left, decreased FMM on left.  Gait- deferred for patient safety.   NIHSS:  1a Level of Conscious.: 0 1b LOC Questions: 0 1c LOC Commands: 0 2 Best Gaze: 0 3 Visual: 0 4 Facial Palsy: 0 5a Motor Arm - left: 0 5b Motor Arm - Right:0  6a Motor Leg - Left: 0 6b Motor Leg - Right: 0 7 Limb Ataxia: 1 8 Sensory: 0 9 Best Language: 1 10 Dysarthria: 1 11 Extinct. and Inatten.: 0 TOTAL: 3   ASSESSMENT/PLAN  Acute Ischemic Infarct:  right midbrain Etiology:  likely small vessel disease  Code Stroke CT head: No acute intracranial abnormality Generalized atrophy and chronic microhemorrhages  CTA head & neck  Negative for LVO.  Chronically severe stenosis of Left PCA P1/P2  MRI   Small acute infarct within right midbrain within gray matter.  No hemorrhage or mass effect.  Innumerable chronic microhemorrhages, concerning for cerebral amyloid angiopathy  2D Echo: pending LDL 35 HgbA1c 7.2 VTE prophylaxis - lovenox Plavix prior to admission, now on aspirin and brilinta for 4 weeks, then aspirin alone.  Therapy recommendations:  pending Disposition:  pending  Hx of Stroke/TIA 2015/2019 Posterior Circulation Strokes 2023 subacute left frontal white matter infarct Discharged on Plavix  Hypertension Home meds:  amlodipine 10mg , losartan 100mg  Stable Blood Pressure Goal: BP less than 220/110. Gradually normalize over 24-48 hours, long term goal normotensive.    Hyperlipidemia Home meds:  crestor 40mg , zetia 10mg  LDL 35, goal < 70 Continue statin at discharge  Diabetes type II Uncontrolled Home meds:  metformin 1000mg  in am/500mg  in evening HgbA1c 7.2, goal < 7.0 CBGs SSI Recommend close follow-up with PCP for better DM control  Tobacco Abuse Former cigarette smoker  Other Stroke Risk Factors Family hx stroke (mother, brother)   Other Active Problems Dementia Aricept 10mg , Namenda 10mg ; restarted  Hospital day # 0  I have personally obtained history,examined this patient, reviewed notes, independently viewed imaging studies, participated in medical decision making and plan of care.ROS completed by me personally and pertinent positives fully documented  I have made any additions or clarifications directly to the above note. Agree with note above.  Patient presented with sudden onset of dizziness and gait ataxia secondary to small right midbrain paramedian infarct likely from small vessel disease.  He has baseline mild dementia as well as some left-sided deficits from a previous stroke.  Recommend aspirin and Brilinta for 4 weeks followed by aspirin alone and discontinue Plavix.  Continue ongoing stroke workup.  Mobilize out of bed.  Physical occupational and speech therapy consults.  He will likely need inpatient rehab after insurance approval.  Long discussion with patient and his son and daughter at the bedside and answered questions.  Discussed with Dr.Pokhrel.  Greater than 50% time during this 50-minute visit was spent in counseling and coordination of care about his lacunar stroke and discussion about his baseline deficits and gait ataxia and need for aggressive risk factor modification and rehabilitation and answering questions.  Delia Heady, MD Medical Director [facility] Pager: [phone] 10/30/2022 2:54 PM   To contact Stroke Continuity provider, please refer to [email or web address]. After hours, contact General  Neurology

## 2022-10-30 NOTE — Evaluation (Signed)
Occupational Therapy Evaluation Patient Details Name: James Henderson MRN: [id number] DOB: Sep 27, 1950 Today's Date: 10/30/2022   History of Present Illness 72 yo male moderate aphasia dysarthria dysconjugate gaze and L side weakness. Imaging reveal R midbrain stroke PMH CVA L parietal infarct, CVA brainstem,DM2, 11 mm meningioma, dementia,glaucoma HTN HLD CKDIII   Clinical Impression   PT admitted with CVA. Pt currently with functional limitiations due to the deficits listed below (see OT problem list). Pt at baseline lives with family with 24/7 A walking with RW. Pt has baseline glaucoma. Pt at this time needs Min (A) with RW and decreased positioning inside the RW. Pt with incontinence of bladder that pt reports at new this admission. No family present to confirm.  Pt will benefit from skilled OT to increase their independence and safety with adls and balance to allow discharge Patient will benefit from intensive inpatient follow up therapy, >3 hours/day .        If plan is discharge home, recommend the following: A little help with walking and/or transfers;A little help with bathing/dressing/bathroom    Functional Status Assessment  Patient has had a recent decline in their functional status and demonstrates the ability to make significant improvements in function in a reasonable and predictable amount of time.  Equipment Recommendations  None recommended by OT    Recommendations for Other Services Rehab consult     Precautions / Restrictions Precautions Precautions: Fall      Mobility Bed Mobility               General bed mobility comments: oob on arrival    Transfers Overall transfer level: Needs assistance Equipment used: Rolling walker (2 wheels) Transfers: Sit to/from Stand Sit to Stand: Min assist           General transfer comment: good use of hands to push up from chair surface. pt with posterior bias with sit<>Stand      Balance Overall balance  assessment: Needs assistance Sitting-balance support: Bilateral upper extremity supported, Feet supported Sitting balance-Leahy Scale: Fair     Standing balance support: Bilateral upper extremity supported, During functional activity, Reliant on assistive device for balance Standing balance-Leahy Scale: Poor               High level balance activites: Turns High Level Balance Comments: high fall risk and outside the RW           ADL either performed or assessed with clinical judgement   ADL Overall ADL's : Needs assistance/impaired Eating/Feeding: Modified independent;Sitting Eating/Feeding Details (indicate cue type and reason): drinking cup and straw Grooming: Wash/dry hands;Set up   Upper Body Bathing: Set up;Sitting Upper Body Bathing Details (indicate cue type and reason): cues to initiate but otherwise demonstrates Lower Body Bathing: Minimal assistance;Set up;Sit to/from stand Lower Body Bathing Details (indicate cue type and reason): pt able to complete majority of bathing in sitting position. pt with balance deficits iwth standing requires external support Upper Body Dressing : Set up   Lower Body Dressing: Minimal assistance Lower Body Dressing Details (indicate cue type and reason): don socks. pt needed help due to long toe nails and strings in socks. pt able to thread it and decreased fine motor of L hand       Toileting - Clothing Manipulation Details (indicate cue type and reason): on arrival pt incontinence of bladder and reports new. pt incontinence in the bed noted as well. OT taking over session due to need for adl with incontinence  Functional mobility during ADLs: Rolling walker (2 wheels);Minimal assistance General ADL Comments: pt needs increased time and outside the RW with turns     Vision Baseline Vision/History: 3 Glaucoma Ability to See in Adequate Light: 1 Impaired Patient Visual Report: Diplopia Vision Assessment?: Vision impaired- to  be further tested in functional context Additional Comments: pt has visual deficits at baseline and unable to clearly communicate delays of diplopia. OT to continue to monitor for proper treatment. Due to the inaccuracy during session will post pone patching/ glasses until able to further determine.     Perception         Praxis         Pertinent Vitals/Pain Pain Assessment Pain Assessment: No/denies pain     Extremity/Trunk Assessment Upper Extremity Assessment Upper Extremity Assessment: Generalized weakness;LUE deficits/detail LUE Coordination: decreased fine motor   Lower Extremity Assessment Lower Extremity Assessment: LLE deficits/detail LLE Deficits / Details: noted to drag LLE with transfers   Cervical / Trunk Assessment Cervical / Trunk Assessment: Kyphotic   Communication Communication Communication: Other (comment) (expressive - needs increased time to clearly communicate need)   Cognition Arousal: Alert Behavior During Therapy: WFL for tasks assessed/performed Overall Cognitive Status: No family/caregiver present to determine baseline cognitive functioning                                       General Comments  denies dizziness with movement    Exercises     Shoulder Instructions      Home Living Family/patient expects to be discharged to:: Private residence Living Arrangements: Spouse/significant other Available Help at Discharge: Family;Available 24 hours/day Type of Home: House Home Access: Level entry     Home Layout: One level     Bathroom Shower/Tub: Chief Strategy Officer: Standard     Home Equipment: Rollator (4 wheels);Cane - single point          Prior Functioning/Environment Prior Level of Function : Needs assist             Mobility Comments: pt reports that he mobilizes indep with rollator but chart from 2023 reports having family present due to balance impairment ADLs Comments: reports  indep        OT Problem List: Impaired balance (sitting and/or standing);Decreased activity tolerance;Decreased safety awareness;Decreased knowledge of use of DME or AE;Decreased knowledge of precautions;Decreased cognition;Impaired vision/perception      OT Treatment/Interventions: Self-care/ADL training;Therapeutic exercise;DME and/or AE instruction;Therapeutic activities;Balance training;Patient/family education;Neuromuscular education;Cognitive remediation/compensation    OT Goals(Current goals can be found in the care plan section) Acute Rehab OT Goals Patient Stated Goal: to drink some water OT Goal Formulation: Patient unable to participate in goal setting Time For Goal Achievement: 11/13/22 Potential to Achieve Goals: Good  OT Frequency: Min 1X/week    Co-evaluation              AM-PAC OT "6 Clicks" Daily Activity     Outcome Measure Help from another person eating meals?: A Little Help from another person taking care of personal grooming?: A Little Help from another person toileting, which includes using toliet, bedpan, or urinal?: A Little Help from another person bathing (including washing, rinsing, drying)?: A Little Help from another person to put on and taking off regular upper body clothing?: A Little Help from another person to put on and taking off regular lower body clothing?: A Little 6 Click  Score: 18   End of Session Equipment Utilized During Treatment: Gait belt;Rolling walker (2 wheels) Nurse Communication: Mobility status;Precautions  Activity Tolerance: Patient tolerated treatment well Patient left: in chair;with call bell/phone within reach;with chair alarm set  OT Visit Diagnosis: Unsteadiness on feet (R26.81);Muscle weakness (generalized) (M62.81)                Time: 7829-5621 OT Time Calculation (min): 22 min Charges:  OT General Charges $OT Visit: 1 Visit OT Evaluation $OT Eval Moderate Complexity: 1 Mod   Brynn, OTR/L  Acute  Rehabilitation Services Office: [phone] .   Mateo Flow 10/30/2022, 10:46 AM

## 2022-10-30 NOTE — [place] FL2 (Signed)
[place] MEDICAID FL2 LEVEL OF CARE FORM     IDENTIFICATION  Patient Name: James Henderson Birthdate: 18-May-1950 Sex: male Admission Date (Current Location): 10/29/2022  [facility] and [place] Number:  Producer, television/film/video and Address:  [facility] [place], [address], [place], [place] [address]      Provider Number: [id number]  Attending Physician Name and Address:  Joycelyn Das, MD  Relative Name and Phone Number:       Current Level of Care: Hospital Recommended Level of Care: Skilled Nursing Facility Prior Approval Number:    Date Approved/Denied:   PASRR Number: [id number]  Discharge Plan: SNF    Current Diagnoses: Patient Active Problem List   Diagnosis Date Noted   Acute ischemic stroke (HCC) 10/29/2022   Mixed Alzheimer's and vascular dementia (HCC) 10/29/2022   Unsteady gait 03/10/2021   Left-sided weakness 09/25/2020   AKI (acute kidney injury) (HCC) 09/24/2020   Diabetes mellitus type 2 in nonobese (HCC)    Benign essential HTN    History of CVA (cerebrovascular accident)    Acute blood loss anemia    CKD stage 3a, GFR 45-59 ml/min (HCC)    Acute CVA (cerebrovascular accident) (HCC) 10/21/2017   HLD (hyperlipidemia) 01/10/2014   Acute brainstem infarction (HCC) 12/07/2013   Dizziness 12/06/2013   DM (diabetes mellitus) type II controlled, neurological manifestation (HCC) 12/06/2013   HYPERLIPIDEMIA 02/26/2006   GLAUCOMA NOS 02/26/2006   Essential hypertension 02/26/2006    Orientation RESPIRATION BLADDER Height & Weight     Self, Time, Situation, Place  Normal Incontinent Weight: 74.8 kg Height:  5\' 6"  (167.6 cm)  BEHAVIORAL SYMPTOMS/MOOD NEUROLOGICAL BOWEL NUTRITION STATUS      Continent Diet (heart healthy/ carb modified with thin liquids)  AMBULATORY STATUS COMMUNICATION OF NEEDS Skin   Limited Assist Verbally Normal                       Personal Care Assistance Level of Assistance  Bathing, Feeding, Dressing  Bathing Assistance: Limited assistance Feeding assistance: Limited assistance Dressing Assistance: Limited assistance     Functional Limitations Info  Sight, Hearing, Speech Sight Info: Adequate Hearing Info: Adequate Speech Info: Impaired    SPECIAL CARE FACTORS FREQUENCY  PT (By licensed PT), OT (By licensed OT), Speech therapy     PT Frequency: 5x/wk OT Frequency: 5x/wk     Speech Therapy Frequency: 5x/wk      Contractures Contractures Info: Not present    Additional Factors Info  Code Status, Allergies, Psychotropic, Insulin Sliding Scale Code Status Info: Full Allergies Info: NKA Psychotropic Info: Aricept 10 mg at bedtime/ Namenda 10 mg BID Insulin Sliding Scale Info: Novolog 0-6 units SQ three times a day/ Novolog 0-5 units SQ at bedtime       Current Medications (10/30/2022):  This is the current hospital active medication list Current Facility-Administered Medications  Medication Dose Route Frequency Provider Last Rate Last Admin   acetaminophen (TYLENOL) tablet 650 mg  650 mg Oral Q4H PRN Opyd, Lavone Neri, MD       Or   acetaminophen (TYLENOL) 160 MG/5ML solution 650 mg  650 mg Per Tube Q4H PRN Opyd, Lavone Neri, MD       Or   acetaminophen (TYLENOL) suppository 650 mg  650 mg Rectal Q4H PRN Opyd, Lavone Neri, MD       amLODipine (NORVASC) tablet 10 mg  10 mg Oral Daily Lynnae January, NP  aspirin EC tablet 81 mg  81 mg Oral Daily Erick Blinks, MD   81 mg at 10/30/22 1053   donepezil (ARICEPT) tablet 10 mg  10 mg Oral QHS Lynnae January, NP       [START ON 10/31/2022] ezetimibe (ZETIA) tablet 10 mg  10 mg Oral Daily Hetty Blend C, NP       insulin aspart (novoLOG) injection 0-5 Units  0-5 Units Subcutaneous QHS Opyd, Lavone Neri, MD       insulin aspart (novoLOG) injection 0-6 Units  0-6 Units Subcutaneous TID WC Opyd, Lavone Neri, MD   2 Units at 10/30/22 1239   memantine (NAMENDA) tablet 10 mg  10 mg Oral BID Hetty Blend C, NP       rosuvastatin (CRESTOR)  tablet 40 mg  40 mg Oral q1800 Hetty Blend C, NP       senna-docusate (Senokot-S) tablet 1 tablet  1 tablet Oral QHS PRN Opyd, Lavone Neri, MD       sodium chloride 0.9 % bolus 1,000 mL  1,000 mL Intravenous Once Opyd, Lavone Neri, MD       [START ON 10/31/2022] ticagrelor (BRILINTA) tablet 90 mg  90 mg Oral BID Lynnae January, NP         Discharge Medications: Please see discharge summary for a list of discharge medications.  Relevant Imaging Results:  Relevant Lab Results:   Additional Information SSN: [id number]  Kermit Balo, RN

## 2022-10-30 NOTE — Consult Note (Signed)
NEUROLOGY CONSULTATION NOTE   Date of service: October 30, 2022 Patient Name: James Henderson MRN:  [id number] DOB:  Mar 25, 1950 Reason for consult: "falls" Requesting Provider: Briscoe Deutscher, MD _ _ _   _ __   _ __ _ _  __ __   _ __   __ _  History of Present Illness  Taelon Braz is a 72 y.o. male with PMH significant for prior strokes, DM2, HTN, HLD, prior meningioma who presents with increased falls over the last day along with dragging his left leg.  He was found to have small R midbrain stroke.  LKW: uclear mRS: 3 tNKASE: not offered, outside window Thrombectomy: not offered, low suspicion for LVO NIHSS components Score: Comment  1a Level of Conscious 0[x]  1[]  2[]  3[]      1b LOC Questions 0[x]  1[]  2[]       1c LOC Commands 0[x]  1[]  2[]       2 Best Gaze 0[x]  1[]  2[]       3 Visual 0[x]  1[]  2[]  3[]      4 Facial Palsy 0[x]  1[]  2[]  3[]      5a Motor Arm - left 0[x]  1[]  2[]  3[]  4[]  UN[]    5b Motor Arm - Right 0[x]  1[]  2[]  3[]  4[]  UN[]    6a Motor Leg - Left 0[x]  1[]  2[]  3[]  4[]  UN[]    6b Motor Leg - Right 0[x]  1[]  2[]  3[]  4[]  UN[]    7 Limb Ataxia 0[]  1[]  2[x]  3[]  UN[]     8 Sensory 0[x]  1[]  2[]  UN[]      9 Best Language 0[]  1[x]  2[]  3[]      10 Dysarthria 0[]  1[x]  2[]  UN[]      11 Extinct. and Inattention 0[x]  1[]  2[]       TOTAL: 4         ROS   Unable to obtain detailed ROS 2/2 aphasia. He denies any pain.  Past History   Past Medical History:  Diagnosis Date   Brainstem stroke (HCC) 2015   Cerebral meningioma (HCC)    Diabetes mellitus without complication (HCC)    Glaucoma    Hyperlipidemia    Hypertension    Past Surgical History:  Procedure Laterality Date   BIOPSY  02/15/2020   Procedure: BIOPSY;  Surgeon: Kerin Salen, MD;  Location: [facility];  Service: Gastroenterology;;   COLONOSCOPY WITH PROPOFOL N/A 02/15/2020   Procedure: COLONOSCOPY WITH PROPOFOL;  Surgeon: Kerin Salen, MD;  Location: [facility];  Service: Gastroenterology;  Laterality: N/A;    EYE SURGERY     POLYPECTOMY  02/15/2020   Procedure: POLYPECTOMY;  Surgeon: Kerin Salen, MD;  Location: [facility];  Service: Gastroenterology;;   Family History  Problem Relation Age of Onset   Stroke Mother    Stroke Brother    Diabetes type II Brother    Social History   Socioeconomic History   Marital status: Married    Spouse name: Mary   Number of children: 1   Years of education: 12   Highest education level: 12th grade  Occupational History   Occupation: maintenance  Tobacco Use   Smoking status: Former    Current packs/day: 0.00    Types: Cigarettes    Quit date: 2000    Years since quitting: 24.7   Smokeless tobacco: Never  Vaping Use   Vaping status: Never Used  Substance and Sexual Activity   Alcohol use: No    Alcohol/week: 0.0 standard drinks of alcohol   Drug use: No   Sexual activity: Not on file  Other Topics Concern   Not on file  Social History Narrative   Patient is married with one biological child and one adopted.   Patient is right handed.   Patient has hs education.   Patient drinks caffeine rarely.   Social Determinants of Health   Financial Resource Strain: Not on file  Food Insecurity: No Food Insecurity (10/30/2022)   Hunger Vital Sign    Worried About Running Out of Food in the Last Year: Never true    Ran Out of Food in the Last Year: Never true  Transportation Needs: No Transportation Needs (10/30/2022)   PRAPARE - Administrator, Civil Service (Medical): No    Lack of Transportation (Non-Medical): No  Physical Activity: Not on file  Stress: Not on file  Social Connections: Not on file   No Known Allergies  Medications   Medications Prior to Admission  Medication Sig Dispense Refill Last Dose   acetaminophen (TYLENOL) 500 MG tablet Take 1,000 mg by mouth every 6 (six) hours as needed for headache (pain).      amLODipine (NORVASC) 10 MG tablet Take 1 tablet (10 mg total) by mouth daily. 30 tablet 0     Brinzolamide-Brimonidine 1-0.2 % SUSP Place 1 drop into both eyes 2 (two) times daily.       Cholecalciferol (VITAMIN D-3) 125 MCG (5000 UT) TABS Take 5,000 Units by mouth daily.      clopidogrel (PLAVIX) 75 MG tablet Take 1 tablet (75 mg total) by mouth daily. 30 tablet 0    donepezil (ARICEPT) 10 MG tablet TAKE ONE TABLET BY MOUTH EVERYDAY AT BEDTIME 30 tablet 0    ezetimibe (ZETIA) 10 MG tablet Take 10 mg by mouth daily.      glipiZIDE (GLUCOTROL) 5 MG tablet Take 5 mg by mouth daily.      Inositol Niacinate 500 MG CAPS Take 500 mg by mouth every evening.      losartan (COZAAR) 100 MG tablet Take 100 mg by mouth daily.  1    memantine (NAMENDA) 10 MG tablet TAKE 1/2 TABLET BY MOUTH AT BEDTIME FOR 1 WEEK, THEN TAKE 1/2 TABLET BY MOUTH TWICE DAILY FOR 1 WEEK, THEN TAKE ONE TABLET BY MOUTH TWICE DAILY 60 tablet 0    metFORMIN (GLUCOPHAGE) 1000 MG tablet Take 500-1,000 mg by mouth See admin instructions. Take1000 mg in the morning and 500 mg in the evening  1    Omega-3 Fatty Acids (FISH OIL) 500 MG CAPS Take 500 mg by mouth every evening.      rosuvastatin (CRESTOR) 40 MG tablet Take 1 tablet (40 mg total) by mouth daily at 6 PM. 30 tablet 0    tolterodine (DETROL LA) 4 MG 24 hr capsule Take 4 mg by mouth daily.      Vibegron (GEMTESA) 75 MG TABS Take 75 mg by mouth daily.        Vitals   Vitals:   10/29/22 2326 10/30/22 0013 10/30/22 0100 10/30/22 0424  BP: 125/63 (!) 151/93  122/71  Pulse: 63 67  60  Resp: 16 16  16   Temp: 98.3 F (36.8 C) 98.2 F (36.8 C)  100 F (37.8 C)  TempSrc: Oral Oral  Axillary  SpO2: 100% 100%  99%  Weight:   74.8 kg   Height:   5\' 6"  (1.676 m)      Body mass index is 26.62 kg/m.  Physical Exam   General: old and somewhat dishelved. Laying comfortably in bed;  in no acute distress.  HENT: Normal oropharynx and mucosa. Normal external appearance of ears and nose.  Neck: Supple, no pain or tenderness  CV: No JVD. No peripheral edema. Pulmonary:  Symmetric Chest rise. Normal respiratory effort.  Abdomen: Soft to touch, non-tender.  Ext: No cyanosis, edema, or deformity  Skin: No rash. Normal palpation of skin.   Musculoskeletal: Normal digits and nails by inspection. No clubbing.   Neurologic Examination  Mental status/Cognition: Alert, oriented to self, place, month and year, poor attention.  Speech/language: non fluent an answer in 1 word or short phrases, dysarthric speech and bradyphrenia, comprehension intact to simple commands only, able to name simple objects. Cranial nerves:   CN II Pupils equal and reactive to light, no VF deficits    CN III,IV,VI Dysconjugate gaze, EOM intact, no gaze preference or deviation, no nystagmus   CN V normal sensation in V1, V2, and V3 segments bilaterally    CN VII no asymmetry, no nasolabial fold flattening    CN VIII normal hearing to speech    CN IX & X normal palatal elevation, no uvular deviation    CN XI 5/5 head turn and 5/5 shoulder shrug bilaterally    CN XII midline tongue protrusion    Motor:  Muscle bulk: poor, tone normal Mvmt Root Nerve  Muscle Right Left Comments  SA C5/6 Ax Deltoid 5 5   EF C5/6 Mc Biceps 5 5   EE C6/7/8 Rad Triceps 5 5   WF C6/7 Med FCR     WE C7/8 PIN ECU     F Ab C8/T1 U ADM/FDI 5 5   HF L1/2/3 Fem Illopsoas 5 4   KE L2/3/4 Fem Quad 5 5   DF L4/5 D Peron Tib Ant 5 5   PF S1/2 Tibial Grc/Sol 5 5    Sensation:  Light touch Intact throughout   Pin prick    Temperature    Vibration   Proprioception    Coordination/Complex Motor:  - Finger to Nose with mild ataxia on right, left significantly worse than right. - Heel to shin with ataxia in LLE - Rapid alternating movement are slowed and ataxic BL - Gait: deferred for patient safety.  Labs   CBC:  Recent Labs  Lab 10/29/22 1647 10/30/22 0539  WBC 5.3 4.8  NEUTROABS 3.2  --   HGB 12.7* 11.0*  HCT 40.8 35.2*  MCV 94.7 92.9  PLT 226 210    Basic Metabolic Panel:  Lab Results   Component Value Date   NA 143 10/29/2022   K 3.9 10/29/2022   CO2 22 10/29/2022   GLUCOSE 108 (H) 10/29/2022   BUN 18 10/29/2022   CREATININE 1.70 (H) 10/29/2022   CALCIUM 9.0 10/29/2022   GFRNONAA 42 (L) 10/29/2022   GFRAA >60 10/24/2017   Lipid Panel:  Lab Results  Component Value Date   LDLCALC 45 03/11/2021   HgbA1c:  Lab Results  Component Value Date   HGBA1C 7.2 (H) 10/30/2022   Urine Drug Screen:     Component Value Date/Time   LABOPIA NONE DETECTED 10/21/2017 1036   COCAINSCRNUR NONE DETECTED 10/21/2017 1036   LABBENZ NONE DETECTED 10/21/2017 1036   AMPHETMU NONE DETECTED 10/21/2017 1036   THCU NONE DETECTED 10/21/2017 1036   LABBARB NONE DETECTED 10/21/2017 1036    Alcohol Level     Component Value Date/Time   ETH <10 10/21/2017 0717    CT Head without contrast(Personally reviewed): CTH was negative for a large  hypodensity concerning for a large territory infarct or hyperdensity concerning for an ICH  CT angio Head and Neck with contrast(Personally reviewed): pending  MRI Brain(Personally reviewed): Small acute R midbrain stroke  Impression   Zedekiah Bahner is a 72 y.o. male with PMH significant for prior strokes, DM2, HTN, HLD, prior meningioma who presents with increased falls over the last day about 4 over the last day along with dragging his left leg. His neurologic examination is notable for mild to moderate aphasia, dysarthria, dysconjugate gaze and L sided weakness and incoordination. He was found to have a small R midbrain stroke.  Etiology is likely small vessel given the location, size and appearance of the stroke.  Recommendations  - Frequent Neuro checks per stroke unit protocol - Recommend Vascular imaging with CTA head and neck - Recommend obtaining TTE - Recommend obtaining Lipid panel with LDL - Please start statin if LDL > 70 - Recommend HbA1c to evaluate for diabetes and how well it is controlled. - Antithrombotic - Aspirin 81mg   daily along with plavix 75mg  daily x 21 days, followed by Aspirin 81mg  daily alone. - Recommend DVT ppx - SBP goal - permissive hypertension first 24 h < 220/110. Held home meds.  - Recommend Telemetry monitoring for arrythmia - Recommend bedside swallow screen prior to PO intake. - Stroke education booklet - Recommend PT/OT/SLP consult   ______________________________________________________________________   Thank you for the opportunity to take part in the care of this patient. If you have any further questions, please contact the neurology consultation attending.  Signed,  Erick Blinks [facility] _ _ _   _ __   _ __ _ _  __ __   _ __   __ _

## 2022-10-30 NOTE — TOC Initial Note (Signed)
Transition of Care [facility]) - Initial/Assessment Note    Patient Details  Name: James Henderson MRN: [id number] Date of Birth: 10-Sep-1950  [facility] CM/SW Contact:    Kermit Balo, RN Phone Number: 10/30/2022, 12:46 PM  Clinical Narrative:                 CM met with the patient and his son at the bedside. [facility] is active with [facility]. Son says her goes about a couple days a week to the center. [facility] provides transportation for this. His family provides other needed transportation.  Son says pt never is alone. Family rotates to make sure that if he is not at [facility] that family is with him.   Family over see his medications at home and son denies any issues.   Current recommendations are for CIR. CM has left a voicemail for [facility]: [name] --awaiting return call.  TOC following.   Expected Discharge Plan: IP Rehab Facility Barriers to Discharge: Continued Medical Work up   Patient Goals and CMS Choice   CMS Medicare.gov Compare Post Acute Care list provided to:: Patient Represenative (must comment) Choice offered to / list presented to : Spouse, Adult Children, Patient ([facility])      Expected Discharge Plan and Services   Discharge Planning Services: CM Consult   Living arrangements for the past 2 months: Single Family Home                                      Prior Living Arrangements/Services Living arrangements for the past 2 months: Single Family Home Lives with:: Spouse Patient language and need for interpreter reviewed:: Yes Do you feel safe going back to the place where you live?: Yes          Current home services: DME (cane/ walker) Criminal Activity/Legal Involvement Pertinent to Current Situation/Hospitalization: No - Comment as needed  Activities of Daily Living Home Assistive Devices/Equipment: Walker (specify type), CBG Meter, Blood pressure cuff ADL Screening (condition at time of admission) Patient's cognitive ability adequate to safely  complete daily activities?: Yes Is the patient deaf or have difficulty hearing?: No Does the patient have difficulty seeing, even when wearing glasses/contacts?: No Does the patient have difficulty concentrating, remembering, or making decisions?: No Patient able to express need for assistance with ADLs?: Yes Does the patient have difficulty dressing or bathing?: Yes Independently performs ADLs?: No Communication: Independent Dressing (OT): Needs assistance Is this a change from baseline?: Pre-admission baseline Grooming: Needs assistance Is this a change from baseline?: Pre-admission baseline Feeding: Independent Bathing: Needs assistance Is this a change from baseline?: Pre-admission baseline Toileting: Needs assistance Is this a change from baseline?: Pre-admission baseline In/Out Bed: Needs assistance Is this a change from baseline?: Pre-admission baseline Walks in Home: Needs assistance Is this a change from baseline?: Pre-admission baseline Does the patient have difficulty walking or climbing stairs?: Yes Weakness of Legs: Both Weakness of Arms/Hands: Both  Permission Sought/Granted                  Emotional Assessment Appearance:: Appears stated age   Affect (typically observed): Quiet Orientation: : Oriented to Self, Oriented to Place, Oriented to Situation   Psych Involvement: No (comment)  Admission diagnosis:  Acute ischemic stroke [facility]) [I63.9] Cerebrovascular accident (CVA) due to thrombosis of cerebral artery (HCC) [I63.30] Patient Active Problem List   Diagnosis Date Noted  Acute ischemic stroke (HCC) 10/29/2022   Mixed Alzheimer's and vascular dementia (HCC) 10/29/2022   Unsteady gait 03/10/2021   Left-sided weakness 09/25/2020   AKI (acute kidney injury) (HCC) 09/24/2020   Diabetes mellitus type 2 in nonobese [facility])    Benign essential HTN    History of CVA (cerebrovascular accident)    Acute blood loss anemia    CKD stage 3a, GFR 45-59 ml/min  (HCC)    Acute CVA (cerebrovascular accident) (HCC) 10/21/2017   HLD (hyperlipidemia) 01/10/2014   Acute brainstem infarction (HCC) 12/07/2013   Dizziness 12/06/2013   DM (diabetes mellitus) type II controlled, neurological manifestation (HCC) 12/06/2013   HYPERLIPIDEMIA 02/26/2006   GLAUCOMA NOS 02/26/2006   Essential hypertension 02/26/2006   PCP:  [facility]:   [facility] [place], [place] - [address] [place] [address] Phone: [phone] Fax: [phone]  [facility] [id number] - [place], [place] - [address] [place] [address] Phone: [phone] Fax: [phone]  [facility] - [place], [place] - [place] [address] [place] [address] Phone: [phone] Fax: [phone]     Social Determinants of Health (SDOH) Social History: SDOH Screenings   Food Insecurity: No Food Insecurity (10/30/2022)  Housing: Low Risk  (10/30/2022)  Transportation Needs: No Transportation Needs (10/30/2022)  Utilities: Not At Risk (10/30/2022)  Tobacco Use: Medium Risk (10/29/2022)   SDOH Interventions:     Readmission Risk Interventions     No data to display

## 2022-10-30 NOTE — Hospital Course (Addendum)
James Henderson is a pleasant 72 y.o. male with medical history significant for type 2 diabetes mellitus, hypertension, hyperlipidemia, and history of CVA with residual aphasia and left-sided weakness presented to the ED with recurrent falls total of 4 in 24 hours with  increased left-sided weakness.  Normally patient is able to ambulate with a walker.  In the ED patient had stable vitals.  EKG showed normal sinus rhythm.  CT scan of the head negative for acute abnormality.  MRI of the brain showed acute infarct in the right midbrain as well as innumerable chronic microhemorrhages throughout the brain.  Lab was notable for creatinine of 1.7.  Neurology was consulted from the ED and patient was admitted hospital for further evaluation and treatment.    Assessment/Plan    Acute ischemic CVA  Continue stroke protocol.  Lipid panel with LDL of 35.  Hemoglobin A1c of 7.2., CTA head & neck and 2D echocardiogram pending.  Check PT/OT/SLP.  Neurology on board and patient has been started on aspirin and Plavix.     Type II DM  Hemoglobin A1c of 7.2 at this time was 6.4 in January 2023.  Continue sliding scale insulin.   Essential hypertension  Currently on permissive hypertension.   Mild AKI on CKD 3a - SCr is 1.70 on admission; baseline may be closer to 1.4.  Creatinine today at 1.5.  Received 2 L of IV fluids.    Dementia  - Delirium precautions

## 2022-10-31 ENCOUNTER — Inpatient Hospital Stay (HOSPITAL_COMMUNITY): Payer: Medicare (Managed Care)

## 2022-10-31 DIAGNOSIS — I6389 Other cerebral infarction: Secondary | ICD-10-CM

## 2022-10-31 DIAGNOSIS — E1140 Type 2 diabetes mellitus with diabetic neuropathy, unspecified: Secondary | ICD-10-CM | POA: Diagnosis not present

## 2022-10-31 DIAGNOSIS — I639 Cerebral infarction, unspecified: Secondary | ICD-10-CM | POA: Diagnosis not present

## 2022-10-31 DIAGNOSIS — G309 Alzheimer's disease, unspecified: Secondary | ICD-10-CM | POA: Diagnosis not present

## 2022-10-31 DIAGNOSIS — Z794 Long term (current) use of insulin: Secondary | ICD-10-CM

## 2022-10-31 DIAGNOSIS — I10 Essential (primary) hypertension: Secondary | ICD-10-CM | POA: Diagnosis not present

## 2022-10-31 LAB — ECHOCARDIOGRAM COMPLETE
AR max vel: 2.15 cm2
AV Area VTI: 2.05 cm2
AV Area mean vel: 2.09 cm2
AV Mean grad: 5 mmHg
AV Peak grad: 8.9 mmHg
Ao pk vel: 1.49 m/s
Area-P 1/2: 2.56 cm2
Height: 66 in
S' Lateral: 2.4 cm
Weight: 2638.47 [oz_av]

## 2022-10-31 LAB — GLUCOSE, CAPILLARY
Glucose-Capillary: 128 mg/dL — ABNORMAL HIGH (ref 70–99)
Glucose-Capillary: 164 mg/dL — ABNORMAL HIGH (ref 70–99)
Glucose-Capillary: 181 mg/dL — ABNORMAL HIGH (ref 70–99)
Glucose-Capillary: 122 mg/dL — ABNORMAL HIGH (ref 70–99)

## 2022-10-31 MED ORDER — FESOTERODINE FUMARATE ER 4 MG PO TB24
4.0000 mg | ORAL_TABLET | Freq: Every day | ORAL | Status: DC
  Administered 2022-11-01: 4 mg via ORAL
  Filled 2022-10-31: qty 1

## 2022-10-31 MED ORDER — BRIMONIDINE TARTRATE 0.2 % OP SOLN
1.0000 [drp] | Freq: Three times a day (TID) | OPHTHALMIC | Status: DC
  Administered 2022-10-31 – 2022-11-01 (×3): 1 [drp] via OPHTHALMIC
  Filled 2022-10-31: qty 5

## 2022-10-31 MED ORDER — BRINZOLAMIDE 1 % OP SUSP
1.0000 [drp] | Freq: Three times a day (TID) | OPHTHALMIC | Status: DC
  Administered 2022-10-31 – 2022-11-01 (×3): 1 [drp] via OPHTHALMIC
  Filled 2022-10-31: qty 10

## 2022-10-31 MED ORDER — MIRABEGRON ER 25 MG PO TB24
25.0000 mg | ORAL_TABLET | Freq: Every day | ORAL | Status: DC
  Administered 2022-11-01: 25 mg via ORAL
  Filled 2022-10-31: qty 1

## 2022-10-31 NOTE — Consult Note (Signed)
Physical Medicine and Rehabilitation Consult Reason for Consult: CVA Referring Physician: Joycelyn Das, MD   HPI: James Henderson is a 72 y.o. male with a past medical history significant for prior strokes, DM2, HTN, HLD, prior meningioma, who presents with increased falls along with dragging his left leg. His neurologic examination was notable for mid to moderate aphasia, dysarthria, dysconjugate gaze, and left sided weakness and incoordination. He was found to have a small right midbrain stroke. CTA negative. Physical Medicine & Rehabilitation was consulted to assess candidacy for CIR.     ROS + left sided weakness Past Medical History:  Diagnosis Date   Brainstem stroke (HCC) 2015   Cerebral meningioma (HCC)    Diabetes mellitus without complication (HCC)    Glaucoma    Hyperlipidemia    Hypertension    Past Surgical History:  Procedure Laterality Date   BIOPSY  02/15/2020   Procedure: BIOPSY;  Surgeon: Kerin Salen, MD;  Location: [facility];  Service: Gastroenterology;;   COLONOSCOPY WITH PROPOFOL N/A 02/15/2020   Procedure: COLONOSCOPY WITH PROPOFOL;  Surgeon: Kerin Salen, MD;  Location: [facility];  Service: Gastroenterology;  Laterality: N/A;   EYE SURGERY     POLYPECTOMY  02/15/2020   Procedure: POLYPECTOMY;  Surgeon: Kerin Salen, MD;  Location: [facility];  Service: Gastroenterology;;   Family History  Problem Relation Age of Onset   Stroke Mother    Stroke Brother    Diabetes type II Brother    Social History:  reports that he quit smoking about 24 years ago. He has never used smokeless tobacco. He reports that he does not drink alcohol and does not use drugs. Allergies: No Known Allergies Medications Prior to Admission  Medication Sig Dispense Refill   acetaminophen (TYLENOL) 500 MG tablet Take 1,000 mg by mouth every 6 (six) hours as needed for headache (pain).     amLODipine (NORVASC) 10 MG tablet Take 1 tablet (10 mg total) by mouth daily. 30  tablet 0   Brinzolamide-Brimonidine 1-0.2 % SUSP Place 1 drop into both eyes 2 (two) times daily.      Cholecalciferol (VITAMIN D-3) 125 MCG (5000 UT) TABS Take 5,000 Units by mouth daily.     clopidogrel (PLAVIX) 75 MG tablet Take 1 tablet (75 mg total) by mouth daily. 30 tablet 0   donepezil (ARICEPT) 10 MG tablet TAKE ONE TABLET BY MOUTH EVERYDAY AT BEDTIME 30 tablet 0   ezetimibe (ZETIA) 10 MG tablet Take 10 mg by mouth daily.     glipiZIDE (GLUCOTROL) 5 MG tablet Take 5 mg by mouth daily.     Inositol Niacinate 500 MG CAPS Take 500 mg by mouth every evening.     losartan (COZAAR) 100 MG tablet Take 100 mg by mouth daily.  1   memantine (NAMENDA) 10 MG tablet TAKE 1/2 TABLET BY MOUTH AT BEDTIME FOR 1 WEEK, THEN TAKE 1/2 TABLET BY MOUTH TWICE DAILY FOR 1 WEEK, THEN TAKE ONE TABLET BY MOUTH TWICE DAILY 60 tablet 0   metFORMIN (GLUCOPHAGE) 1000 MG tablet Take 500-1,000 mg by mouth See admin instructions. Take1000 mg in the morning and 500 mg in the evening  1   Omega-3 Fatty Acids (FISH OIL) 500 MG CAPS Take 500 mg by mouth every evening.     rosuvastatin (CRESTOR) 40 MG tablet Take 1 tablet (40 mg total) by mouth daily at 6 PM. 30 tablet 0   tolterodine (DETROL LA) 4 MG 24 hr capsule Take 4 mg by  mouth daily.     Vibegron (GEMTESA) 75 MG TABS Take 75 mg by mouth daily.      Home: Home Living Family/patient expects to be discharged to:: Private residence Living Arrangements: Spouse/significant other Available Help at Discharge: Family, Available 24 hours/day Type of Home: House Home Access: Ramped entrance Home Layout: One level Bathroom Shower/Tub: Engineer, manufacturing systems: Standard Home Equipment: Rollator (4 wheels), Cane - single point  Functional History: Prior Function Prior Level of Function : Needs assist Mobility Comments: Pt reports he ambulates with rollator without physical assistance, notes 4 falls in the day prior to admission. ADLs Comments: reports  indep Functional Status:  Mobility: Bed Mobility Overal bed mobility: Needs Assistance Bed Mobility: Supine to Sit Supine to sit: Min assist, Mod assist, HOB elevated, Used rails General bed mobility comments: Pt is able to transfer BLE to EOB but requires min<>mod assist to upright trunk. Transfers Overall transfer level: Needs assistance Equipment used: Rolling walker (2 wheels) Transfers: Sit to/from Stand Sit to Stand: Min assist General transfer comment: STS from EOB & recliner, extra time to power up to standing Ambulation/Gait Ambulation/Gait assistance: Min assist Gait Distance (Feet): 6 Feet Assistive device: Rolling walker (2 wheels) Gait Pattern/deviations: Decreased step length - right, Decreased step length - left, Decreased dorsiflexion - left, Decreased stride length General Gait Details: Pt ambulates ~4 ft forwards with RW, 4 ft backwards with min assist 2/2 incontinent void & need to return to recliner. During short distance gait, pt requires cuing to ambulate within base of AD. Pt with decreased LLE hip/knee flexion during swing phase, decreased L foot clearance (pt dragging foot), with absent dorsiflexion & heel strike. Gait velocity: decreased    ADL: ADL Overall ADL's : Needs assistance/impaired Eating/Feeding: Modified independent, Sitting Eating/Feeding Details (indicate cue type and reason): drinking cup and straw Grooming: Wash/dry hands, Set up Upper Body Bathing: Set up, Sitting Upper Body Bathing Details (indicate cue type and reason): cues to initiate but otherwise demonstrates Lower Body Bathing: Minimal assistance, Set up, Sit to/from stand Lower Body Bathing Details (indicate cue type and reason): pt able to complete majority of bathing in sitting position. pt with balance deficits iwth standing requires external support Upper Body Dressing : Set up Lower Body Dressing: Minimal assistance Lower Body Dressing Details (indicate cue type and reason):  don socks. pt needed help due to long toe nails and strings in socks. pt able to thread it and decreased fine motor of L hand Toileting - Clothing Manipulation Details (indicate cue type and reason): on arrival pt incontinence of bladder and reports new. pt incontinence in the bed noted as well. OT taking over session due to need for adl with incontinence Functional mobility during ADLs: Rolling walker (2 wheels), Minimal assistance General ADL Comments: pt needs increased time and outside the RW with turns  Cognition: Cognition Overall Cognitive Status: No family/caregiver present to determine baseline cognitive functioning Arousal/Alertness: Awake/alert Orientation Level: Oriented to person, Oriented to place, Oriented to time, Disoriented to situation Memory: Impaired Memory Impairment: Storage deficit, Retrieval deficit Executive Function: Sequencing Sequencing: Impaired Sequencing Impairment: Verbal basic Cognition Arousal: Alert Behavior During Therapy: WFL for tasks assessed/performed Overall Cognitive Status: No family/caregiver present to determine baseline cognitive functioning General Comments: Pt pleasant, follows simple commands throughout session, is incontinent of urine & reports this is new.  Blood pressure 119/69, pulse 66, temperature 98.3 F (36.8 C), temperature source Oral, resp. rate 18, height 5\' 6"  (1.676 m), weight 74.8 kg, SpO2 100%.  Physical Exam Gen: no distress, normal appearing HEENT: oral mucosa pink and moist, NCAT Cardio: Reg rate Chest: normal effort, normal rate of breathing Abd: soft, non-distended Ext: no edema Psych: pleasant, normal affect Skin: intact Neuro: alert and oriented x3 Musculoskeletal: 4/5 strength throughout left side, ataxia on left side. Sensation is intact  Results for orders placed or performed during the hospital encounter of 10/29/22 (from the past 24 hour(s))  Glucose, capillary     Status: Abnormal   Collection Time:  10/30/22 12:07 PM  Result Value Ref Range   Glucose-Capillary 232 (H) 70 - 99 mg/dL   Comment 1 Notify RN    Comment 2 Document in Chart   Glucose, capillary     Status: None   Collection Time: 10/30/22  4:47 PM  Result Value Ref Range   Glucose-Capillary 99 70 - 99 mg/dL   Comment 1 Notify RN    Comment 2 Document in Chart   Glucose, capillary     Status: Abnormal   Collection Time: 10/30/22  9:04 PM  Result Value Ref Range   Glucose-Capillary 166 (H) 70 - 99 mg/dL  Glucose, capillary     Status: Abnormal   Collection Time: 10/31/22  6:47 AM  Result Value Ref Range   Glucose-Capillary 128 (H) 70 - 99 mg/dL   ECHOCARDIOGRAM COMPLETE  Result Date: 10/31/2022    ECHOCARDIOGRAM REPORT   Patient Name:   CULLEN STEFF Date of Exam: 10/31/2022 Medical Rec #:  [id number]      Height:       66.0 in Accession #:    [id number]     Weight:       164.9 lb Date of Birth:  December 28, 1950       BSA:          1.842 m Patient Age:    72 years       BP:           121/63 mmHg Patient Gender: M              HR:           66 bpm. Exam Location:  Inpatient Procedure: 2D Echo, Cardiac Doppler and Color Doppler Indications:    Stroke I63.9  History:        Patient has prior history of Echocardiogram examinations, most                 recent 03/11/2021. CKD 3 and Stroke; Risk Factors:Hypertension,                 Dyslipidemia, Diabetes and Former Smoker.  Sonographer:    Dondra Prader RVT RCS Referring Phys: [id number] TIMOTHY S OPYD  Sonographer Comments: Suboptimal parasternal window. IMPRESSIONS  1. Left ventricular ejection fraction, by estimation, is 60 to 65%. The left ventricle has normal function. The left ventricle has no regional wall motion abnormalities. There is mild concentric left ventricular hypertrophy. Left ventricular diastolic parameters are consistent with Grade I diastolic dysfunction (impaired relaxation).  2. Right ventricular systolic function is normal. The right ventricular size is normal. Tricuspid  regurgitation signal is inadequate for assessing PA pressure.  3. The mitral valve is normal in structure. No evidence of mitral valve regurgitation.  4. Aortic valve regurgitation is not visualized. Aortic valve sclerosis/calcification is present, without any evidence of aortic stenosis.  5. The inferior vena cava is normal in size with greater than 50% respiratory variability, suggesting right atrial pressure of 3 mmHg. FINDINGS  Left Ventricle:  Left ventricular ejection fraction, by estimation, is 60 to 65%. The left ventricle has normal function. The left ventricle has no regional wall motion abnormalities. The left ventricular internal cavity size was normal in size. There is  mild concentric left ventricular hypertrophy. Left ventricular diastolic parameters are consistent with Grade I diastolic dysfunction (impaired relaxation). Right Ventricle: The right ventricular size is normal. Right ventricular systolic function is normal. Tricuspid regurgitation signal is inadequate for assessing PA pressure. Left Atrium: Left atrial size was normal in size. Right Atrium: Right atrial size was normal in size. Pericardium: There is no evidence of pericardial effusion. Mitral Valve: The mitral valve is normal in structure. No evidence of mitral valve regurgitation. Tricuspid Valve: Tricuspid valve regurgitation is not demonstrated. Aortic Valve: Aortic valve regurgitation is not visualized. Aortic valve sclerosis/calcification is present, without any evidence of aortic stenosis. Aortic valve mean gradient measures 5.0 mmHg. Aortic valve peak gradient measures 8.9 mmHg. Aortic valve  area, by VTI measures 2.05 cm. Pulmonic Valve: The pulmonic valve was not well visualized. Pulmonic valve regurgitation is not visualized. Aorta: The aortic root and ascending aorta are structurally normal, with no evidence of dilitation. Venous: The inferior vena cava is normal in size with greater than 50% respiratory variability,  suggesting right atrial pressure of 3 mmHg. IAS/Shunts: No atrial level shunt detected by color flow Doppler.  LEFT VENTRICLE PLAX 2D LVIDd:         3.40 cm   Diastology LVIDs:         2.40 cm   LV e' medial:    7.18 cm/s LV PW:         1.10 cm   LV E/e' medial:  12.4 LV IVS:        1.10 cm   LV e' lateral:   5.77 cm/s LVOT diam:     1.90 cm   LV E/e' lateral: 15.4 LV SV:         61 LV SV Index:   33 LVOT Area:     2.84 cm  RIGHT VENTRICLE             IVC RV Basal diam:  4.10 cm     IVC diam: 1.40 cm RV S prime:     11.90 cm/s TAPSE (M-mode): 2.1 cm LEFT ATRIUM             Index        RIGHT ATRIUM           Index LA diam:        2.90 cm 1.57 cm/m   RA Area:     11.20 cm LA Vol (A2C):   31.7 ml 17.21 ml/m  RA Volume:   27.00 ml  14.66 ml/m LA Vol (A4C):   39.3 ml 21.33 ml/m LA Biplane Vol: 36.2 ml 19.65 ml/m  AORTIC VALVE                     PULMONIC VALVE AV Area (Vmax):    2.15 cm      PV Vmax:       0.90 m/s AV Area (Vmean):   2.09 cm      PV Peak grad:  3.3 mmHg AV Area (VTI):     2.05 cm AV Vmax:           149.00 cm/s AV Vmean:          102.000 cm/s AV VTI:  0.296 m AV Peak Grad:      8.9 mmHg AV Mean Grad:      5.0 mmHg LVOT Vmax:         113.00 cm/s LVOT Vmean:        75.300 cm/s LVOT VTI:          0.214 m LVOT/AV VTI ratio: 0.72  AORTA Ao Root diam: 3.00 cm Ao Asc diam:  3.40 cm MITRAL VALVE MV Area (PHT): 2.56 cm     SHUNTS MV Decel Time: 296 msec     Systemic VTI:  0.21 m MV E velocity: 88.70 cm/s   Systemic Diam: 1.90 cm MV A velocity: 139.00 cm/s MV E/A ratio:  0.64 Carolan Clines Electronically signed by Carolan Clines Signature Date/Time: 10/31/2022/9:06:34 AM    Final    CT ANGIO HEAD NECK W WO CM  Result Date: 10/30/2022 CLINICAL DATA:  72 year old male with neurologic deficit, frequent falls. Right midbrain infarct on MRI yesterday. History of severe left PCA stenosis. EXAM: CT ANGIOGRAPHY HEAD AND NECK WITH AND WITHOUT CONTRAST TECHNIQUE: Multidetector CT imaging of the head and  neck was performed using the standard protocol during bolus administration of intravenous contrast. Multiplanar CT image reconstructions and MIPs were obtained to evaluate the vascular anatomy. Carotid stenosis measurements (when applicable) are obtained utilizing NASCET criteria, using the distal internal carotid diameter as the denominator. RADIATION DOSE REDUCTION: This exam was performed according to the departmental dose-optimization program which includes automated exposure control, adjustment of the mA and/or kV according to patient size and/or use of iterative reconstruction technique. CONTRAST:  75mL OMNIPAQUE IOHEXOL 350 MG/ML SOLN COMPARISON:  Brain MRI yesterday. Prior CTA head and neck 03/11/2021. FINDINGS: CTA NECK Skeleton: Mostly absent dentition. Widespread advanced cervical spine degeneration. No acute osseous abnormality identified. Upper chest: Stable, negative. Other neck: No acute finding. Aortic arch: Calcified aortic atherosclerosis.  3 vessel arch. Right carotid system: Negative brachiocephalic artery and right CCA origin. Tortuous proximal right CCA. Minimal plaque at the right carotid bifurcation. No stenosis to the skull base. Left carotid system: Similar tortuosity and minimal plaque. No left carotid stenosis to the skull base. Vertebral arteries: Calcified right subclavian artery origin without stenosis. Normal right vertebral artery origin. Patent right vertebral artery with tortuosity to the skull base. No significant plaque or stenosis. Normal proximal left subclavian artery and left vertebral artery origin. Codominant, patent left vertebral artery to the skull base without significant plaque or stenosis. CTA HEAD Posterior circulation: Patent distal vertebral arteries and vertebrobasilar junction without plaque or stenosis. Patent PICA, AICA origins. Patent basilar artery without stenosis. Patent SCA and PCA origins. Chronic PCA irregularity bilaterally, moderate to severe in the  left distal P1 and P2 segments which demonstrate string sign stenosis (series 11, image 22) which is stable from the CTA last year. Distal left P2, and other left PCA branches remain patent and appear more normal. No high-grade right PCA stenosis. Anterior circulation: Both ICA siphons are patent. Distal left siphon calcified plaque is mild without stenosis. Right siphon cavernous and supraclinoid calcified plaque with mild right supraclinoid ICA stenosis. Patent carotid termini. Posterior communicating arteries are diminutive or absent. Patent MCA and ACA origins with dominant right and diminutive left A1 segments. Normal anterior communicating artery. Bilateral ACA branches are stable and within normal limits. Left MCA M1 segment and bifurcation are patent without stenosis. Right MCA M1 segment and bifurcation are patent without stenosis. Bilateral MCA branches are stable. And there is moderate to severe right M3  or M4 stenosis on series 13, image 13. Venous sinuses: Early contrast timing, not evaluated. Anatomic variants: Dominant right ACA A1. Review of the MIP images confirms the above findings IMPRESSION: 1. Negative for emergent large vessel occlusion. Positive for stable chronically severe stenoses of the: - Left PCA P1/P2 (string sign stenosis). - Right MCA posterior M3/M4 branch. 2. Mild ICA siphon calcified plaque with mild right supraclinoid ICA stenosis. Minimal extracranial atherosclerosis, no significant extracranial stenosis. 3.  Aortic Atherosclerosis (ICD10-I70.0). Electronically Signed   By: Odessa Fleming M.D.   On: 10/30/2022 08:16   MR BRAIN WO CONTRAST  Result Date: 10/29/2022 CLINICAL DATA:  Frequent falls.  Acute neurologic deficit. EXAM: MRI HEAD WITHOUT CONTRAST TECHNIQUE: Multiplanar, multiecho pulse sequences of the brain and surrounding structures were obtained without intravenous contrast. COMPARISON:  02/27/2022 FINDINGS: Brain: There is a small acute infarct within the right mid brain,  within the periaqueductal gray matter. There are innumerable chronic microhemorrhages throughout the brain in a mixed central and peripheral distribution. No acute hemorrhage. There is confluent hyperintense T2-weighted signal within the white matter. Generalized volume loss. The midline structures are normal. Unchanged small meningioma, measuring approximately 9 mm, at the lateral inferior surface of the left tentorial leaflet. Vascular: Normal flow voids Skull and upper cervical spine: No acute finding Sinuses/Orbits: Paranasal sinuses are clear. Bilateral ocular lens replacements. Other: None IMPRESSION: 1. Small acute infarct within the right mid brain, within the periaqueductal gray matter. No hemorrhage or mass effect. 2. Innumerable chronic microhemorrhages throughout the brain in a mixed central and peripheral distribution, concerning for cerebral amyloid angiopathy. Electronically Signed   By: Deatra Robinson M.D.   On: 10/29/2022 23:03   DG Chest Port 1 View  Result Date: 10/29/2022 CLINICAL DATA:  Altered mental status EXAM: PORTABLE CHEST 1 VIEW COMPARISON:  None Available. FINDINGS: Lungs are well expanded, symmetric, and clear. No pneumothorax or pleural effusion. Cardiac size within normal limits. Pulmonary vascularity is normal. Osseous structures are age-appropriate. No acute bone abnormality. IMPRESSION: No active disease. Electronically Signed   By: Helyn Numbers M.D.   On: 10/29/2022 19:52   CT Head Wo Contrast  Result Date: 10/29/2022 CLINICAL DATA:  Fall EXAM: CT HEAD WITHOUT CONTRAST TECHNIQUE: Contiguous axial images were obtained from the base of the skull through the vertex without intravenous contrast. RADIATION DOSE REDUCTION: This exam was performed according to the departmental dose-optimization program which includes automated exposure control, adjustment of the mA and/or kV according to patient size and/or use of iterative reconstruction technique. COMPARISON:  03/11/2021  FINDINGS: Brain: There is no mass, hemorrhage or extra-axial collection. There is generalized atrophy without lobar predilection. Hypodensity of the white matter is most commonly associated with chronic microvascular disease. Vascular: Atherosclerotic calcification of the vertebral and internal carotid arteries at the skull base. No abnormal hyperdensity of the major intracranial arteries or dural venous sinuses. Skull: The visualized skull base, calvarium and extracranial soft tissues are normal. Sinuses/Orbits: No fluid levels or advanced mucosal thickening of the visualized paranasal sinuses. No mastoid or middle ear effusion. The orbits are normal. IMPRESSION: 1. No acute intracranial abnormality. 2. Generalized atrophy and findings of chronic microvascular disease. Electronically Signed   By: Deatra Robinson M.D.   On: 10/29/2022 19:39    Assessment/Plan: Diagnosis: Acute ischemic CVA Does the need for close, 24 hr/day medical supervision in concert with the patient's rehab needs make it unreasonable for this patient to be served in a less intensive setting? Yes Co-Morbidities requiring supervision/potential complications:  1) overweight: provide dietary education 2) HTN: monitor BP TID 3) HLD 4) type 2 DM: check CBGs AC/HS 5) tobacco abuse: provide counseling Due to bladder management, bowel management, safety, skin/wound care, disease management, medication administration, pain management, and patient education, does the patient require 24 hr/day rehab nursing? Yes Does the patient require coordinated care of a physician, rehab nurse, therapy disciplines of PT, OT,  to address physical and functional deficits in the context of the above medical diagnosis(es)? Yes Addressing deficits in the following areas: balance, endurance, locomotion, strength, transferring, bowel/bladder control, bathing, dressing, feeding, grooming, toileting, and psychosocial support Can the patient actively participate in  an intensive therapy program of at least 3 hrs of therapy per day at least 5 days per week? Yes The potential for patient to make measurable gains while on inpatient rehab is excellent Anticipated functional outcomes upon discharge from inpatient rehab are supervision  with PT, supervision with OT, supervision with SLP. Estimated rehab length of stay to reach the above functional goals is: 10-14 days Anticipated discharge destination: Home Overall Rehab/Functional Prognosis: excellent  POST ACUTE RECOMMENDATIONS: This patient's condition is appropriate for continued rehabilitative care in the following setting: CIR Patient has agreed to participate in recommended program. Yes Note that insurance prior authorization may be required for reimbursement for recommended care.    I have personally performed a face to face diagnostic evaluation of this patient. Additionally, I have examined the patient's medical record including any pertinent labs and radiographic images. If the physician assistant has documented in this note, I have reviewed and edited or otherwise concur with the physician assistant's documentation.  Thanks,  Horton Chin, MD 10/31/2022

## 2022-10-31 NOTE — TOC Progression Note (Signed)
Transition of Care [facility]) - Progression Note    Patient Details  Name: James Henderson MRN: [id number] Date of Birth: Oct 14, 1950  [facility] CM/SW Contact  Kermit Balo, RN Phone Number: 10/31/2022, 10:11 AM  Clinical Narrative:     CM received a call from [place] SW at [facility]. They are approving a CIR admission. CM has updated Vernona Rieger with CIR and provided her [place] #.  TOC following.  Expected Discharge Plan: Skilled Nursing Facility Barriers to Discharge: Continued Medical Work up  Expected Discharge Plan and Services   Discharge Planning Services: CM Consult   Living arrangements for the past 2 months: Single Family Home                                       Social Determinants of Health (SDOH) Interventions SDOH Screenings   Food Insecurity: No Food Insecurity (10/30/2022)  Housing: Low Risk  (10/30/2022)  Transportation Needs: No Transportation Needs (10/30/2022)  Utilities: Not At Risk (10/30/2022)  Tobacco Use: Medium Risk (10/29/2022)    Readmission Risk Interventions     No data to display

## 2022-10-31 NOTE — Progress Notes (Addendum)
PROGRESS NOTE    James Henderson  UYQ:[id number] DOB: [birth year] DOA: 10/29/2022 PCP: [facility]    Brief Narrative:   James Henderson is a pleasant 72 y.o. male with medical history significant for type 2 diabetes mellitus, hypertension, hyperlipidemia, and history of CVA with residual aphasia and left-sided weakness presented to the ED with recurrent falls total of 4 in 24 hours with  increased left-sided weakness.  Normally patient is able to ambulate with a walker.  In the ED patient had stable vitals.  EKG showed normal sinus rhythm.  CT scan of the head negative for acute abnormality.  MRI of the brain showed acute infarct in the right midbrain as well as innumerable chronic microhemorrhages throughout the brain.  Lab was notable for creatinine of 1.7.  Neurology was consulted from the ED and patient was admitted hospital for further evaluation and treatment.    Assessment/Plan    Acute ischemic CVA  History of previous stroke. Continue stroke protocol.  Lipid panel with LDL of 35.  Hemoglobin A1c of 7.2., CTA head & neck was negative for emergent large vessel occlusion.  Had left PCA P1 P2 string sign stenosis.   2D echocardiogram from today with LVEF of 60-65 percent with LVH and grade 1 diastolic dysfunction.    Patient does have baseline weakness from previous stroke and uses walker at home. PT/OT seen the patient and recommend CIR placement at this time.  Patient is currently on aspirin and Brilinta as per neurology for 4 weeks followed by aspirin alone.   Type II DM  Hemoglobin A1c of 7.2 at this time was 6.4 in January 2023.  Continue sliding scale insulin.  Diabetic diet.  Latest POC glucose of 128.   Essential hypertension  Currently amlodipine.  Patient is on losartan and amlodipine at home.   Mild AKI on CKD 3a - SCr is 1.70 on admission; baseline may be closer to 1.4.  Latest creatinine at 1.5.  Check BMP in AM.   Dementia  - Delirium  precautions .  Patient is alert awake and Communicative.     DVT prophylaxis: SCD's Start: 10/29/22 2325   Code Status:     Code Status: Full Code  Disposition: CIR as per PT recommendation.  Medically stable for disposition  Status is: Observation  The patient is inpatient because: Need for rehabilitation   Family Communication: Spoke with the patient's son at bedside on 10/30/2022.  Consultants:  Neurology  Procedures:  None  Antimicrobials:  None  Anti-infectives (From admission, onward)    None      Subjective: Today, patient was seen and examined at bedside.  Patient denies any nausea vomiting fevers, chills headache dizziness, lightheadedness but still has left upper and lower extremity weakness.    Objective: Vitals:   10/30/22 2021 10/30/22 2337 10/31/22 0500 10/31/22 0732  BP: 126/71 120/75 121/63 119/69  Pulse: 72 78 70 66  Resp: 16 18 19 18   Temp: 99 F (37.2 C) 98.4 F (36.9 C) 98.2 F (36.8 C) 98.3 F (36.8 C)  TempSrc: Oral Oral Oral Oral  SpO2: 100% 100% 100% 100%  Weight:      Height:        Intake/Output Summary (Last 24 hours) at 10/31/2022 1026 Last data filed at 10/31/2022 0800 Gross per 24 hour  Intake 480 ml  Output --  Net 480 ml   Filed Weights   10/29/22 1641 10/30/22 0100  Weight: 77.1 kg 74.8 kg  Physical Examination: Body mass index is 26.62 kg/m.   General:  Average built, not in obvious distress, dysarthric speech HENT:   No scleral pallor or icterus noted. Oral mucosa is moist.  Chest:   Diminished breath sounds bilaterally. No crackles or wheezes.  CVS: S1 &S2 heard. No murmur.  Regular rate and rhythm. Abdomen: Soft, nontender, nondistended.  Bowel sounds are heard.   Extremities: No cyanosis, clubbing or edema.   Psych: Alert, awake and oriented, normal mood CNS:  Expressive dysarthria..  Left upper and lower extremity weakness Skin: Warm and dry.  No rashes noted.  Data Reviewed:   CBC: Recent Labs   Lab 10/29/22 1647 10/30/22 0539  WBC 5.3 4.8  NEUTROABS 3.2  --   HGB 12.7* 11.0*  HCT 40.8 35.2*  MCV 94.7 92.9  PLT 226 210    Basic Metabolic Panel: Recent Labs  Lab 10/29/22 1647 10/30/22 0539  NA 143 142  K 3.9 3.5  CL 111 107  CO2 22 24  GLUCOSE 108* 123*  BUN 18 18  CREATININE 1.70* 1.51*  CALCIUM 9.0 8.9  MG 2.3  --     Liver Function Tests: Recent Labs  Lab 10/29/22 1647  AST 35  ALT 22  ALKPHOS 97  BILITOT 0.7  PROT 6.7  ALBUMIN 3.8     Radiology Studies: ECHOCARDIOGRAM COMPLETE  Result Date: 10/31/2022    ECHOCARDIOGRAM REPORT   Patient Name:   James Henderson Date of Exam: 10/31/2022 Medical Rec #:  [id number]      Height:       66.0 in Accession #:    [id number]     Weight:       164.9 lb Date of Birth:  [birth year]       BSA:          1.842 m Patient Age:    72 years       BP:           121/63 mmHg Patient Gender: M              HR:           66 bpm. Exam Location:  Inpatient Procedure: 2D Echo, Cardiac Doppler and Color Doppler Indications:    Stroke I63.9  History:        Patient has prior history of Echocardiogram examinations, most                 recent 03/11/2021. CKD 3 and Stroke; Risk Factors:Hypertension,                 Dyslipidemia, Diabetes and Former Smoker.  Sonographer:    Dondra Prader RVT RCS Referring Phys: [id number] TIMOTHY S OPYD  Sonographer Comments: Suboptimal parasternal window. IMPRESSIONS  1. Left ventricular ejection fraction, by estimation, is 60 to 65%. The left ventricle has normal function. The left ventricle has no regional wall motion abnormalities. There is mild concentric left ventricular hypertrophy. Left ventricular diastolic parameters are consistent with Grade I diastolic dysfunction (impaired relaxation).  2. Right ventricular systolic function is normal. The right ventricular size is normal. Tricuspid regurgitation signal is inadequate for assessing PA pressure.  3. The mitral valve is normal in structure. No evidence of mitral  valve regurgitation.  4. Aortic valve regurgitation is not visualized. Aortic valve sclerosis/calcification is present, without any evidence of aortic stenosis.  5. The inferior vena cava is normal in size with greater than 50% respiratory variability, suggesting right atrial pressure of 3 mmHg.  FINDINGS  Left Ventricle: Left ventricular ejection fraction, by estimation, is 60 to 65%. The left ventricle has normal function. The left ventricle has no regional wall motion abnormalities. The left ventricular internal cavity size was normal in size. There is  mild concentric left ventricular hypertrophy. Left ventricular diastolic parameters are consistent with Grade I diastolic dysfunction (impaired relaxation). Right Ventricle: The right ventricular size is normal. Right ventricular systolic function is normal. Tricuspid regurgitation signal is inadequate for assessing PA pressure. Left Atrium: Left atrial size was normal in size. Right Atrium: Right atrial size was normal in size. Pericardium: There is no evidence of pericardial effusion. Mitral Valve: The mitral valve is normal in structure. No evidence of mitral valve regurgitation. Tricuspid Valve: Tricuspid valve regurgitation is not demonstrated. Aortic Valve: Aortic valve regurgitation is not visualized. Aortic valve sclerosis/calcification is present, without any evidence of aortic stenosis. Aortic valve mean gradient measures 5.0 mmHg. Aortic valve peak gradient measures 8.9 mmHg. Aortic valve  area, by VTI measures 2.05 cm. Pulmonic Valve: The pulmonic valve was not well visualized. Pulmonic valve regurgitation is not visualized. Aorta: The aortic root and ascending aorta are structurally normal, with no evidence of dilitation. Venous: The inferior vena cava is normal in size with greater than 50% respiratory variability, suggesting right atrial pressure of 3 mmHg. IAS/Shunts: No atrial level shunt detected by color flow Doppler.  LEFT VENTRICLE PLAX 2D  LVIDd:         3.40 cm   Diastology LVIDs:         2.40 cm   LV e' medial:    7.18 cm/s LV PW:         1.10 cm   LV E/e' medial:  12.4 LV IVS:        1.10 cm   LV e' lateral:   5.77 cm/s LVOT diam:     1.90 cm   LV E/e' lateral: 15.4 LV SV:         61 LV SV Index:   33 LVOT Area:     2.84 cm  RIGHT VENTRICLE             IVC RV Basal diam:  4.10 cm     IVC diam: 1.40 cm RV S prime:     11.90 cm/s TAPSE (M-mode): 2.1 cm LEFT ATRIUM             Index        RIGHT ATRIUM           Index LA diam:        2.90 cm 1.57 cm/m   RA Area:     11.20 cm LA Vol (A2C):   31.7 ml 17.21 ml/m  RA Volume:   27.00 ml  14.66 ml/m LA Vol (A4C):   39.3 ml 21.33 ml/m LA Biplane Vol: 36.2 ml 19.65 ml/m  AORTIC VALVE                     PULMONIC VALVE AV Area (Vmax):    2.15 cm      PV Vmax:       0.90 m/s AV Area (Vmean):   2.09 cm      PV Peak grad:  3.3 mmHg AV Area (VTI):     2.05 cm AV Vmax:           149.00 cm/s AV Vmean:          102.000 cm/s AV VTI:  0.296 m AV Peak Grad:      8.9 mmHg AV Mean Grad:      5.0 mmHg LVOT Vmax:         113.00 cm/s LVOT Vmean:        75.300 cm/s LVOT VTI:          0.214 m LVOT/AV VTI ratio: 0.72  AORTA Ao Root diam: 3.00 cm Ao Asc diam:  3.40 cm MITRAL VALVE MV Area (PHT): 2.56 cm     SHUNTS MV Decel Time: 296 msec     Systemic VTI:  0.21 m MV E velocity: 88.70 cm/s   Systemic Diam: 1.90 cm MV A velocity: 139.00 cm/s MV E/A ratio:  0.64 Carolan Clines Electronically signed by Carolan Clines Signature Date/Time: 10/31/2022/9:06:34 AM    Final    CT ANGIO HEAD NECK W WO CM  Result Date: 10/30/2022 CLINICAL DATA:  72 year old male with neurologic deficit, frequent falls. Right midbrain infarct on MRI yesterday. History of severe left PCA stenosis. EXAM: CT ANGIOGRAPHY HEAD AND NECK WITH AND WITHOUT CONTRAST TECHNIQUE: Multidetector CT imaging of the head and neck was performed using the standard protocol during bolus administration of intravenous contrast. Multiplanar CT image  reconstructions and MIPs were obtained to evaluate the vascular anatomy. Carotid stenosis measurements (when applicable) are obtained utilizing NASCET criteria, using the distal internal carotid diameter as the denominator. RADIATION DOSE REDUCTION: This exam was performed according to the departmental dose-optimization program which includes automated exposure control, adjustment of the mA and/or kV according to patient size and/or use of iterative reconstruction technique. CONTRAST:  75mL OMNIPAQUE IOHEXOL 350 MG/ML SOLN COMPARISON:  Brain MRI yesterday. Prior CTA head and neck 03/11/2021. FINDINGS: CTA NECK Skeleton: Mostly absent dentition. Widespread advanced cervical spine degeneration. No acute osseous abnormality identified. Upper chest: Stable, negative. Other neck: No acute finding. Aortic arch: Calcified aortic atherosclerosis.  3 vessel arch. Right carotid system: Negative brachiocephalic artery and right CCA origin. Tortuous proximal right CCA. Minimal plaque at the right carotid bifurcation. No stenosis to the skull base. Left carotid system: Similar tortuosity and minimal plaque. No left carotid stenosis to the skull base. Vertebral arteries: Calcified right subclavian artery origin without stenosis. Normal right vertebral artery origin. Patent right vertebral artery with tortuosity to the skull base. No significant plaque or stenosis. Normal proximal left subclavian artery and left vertebral artery origin. Codominant, patent left vertebral artery to the skull base without significant plaque or stenosis. CTA HEAD Posterior circulation: Patent distal vertebral arteries and vertebrobasilar junction without plaque or stenosis. Patent PICA, AICA origins. Patent basilar artery without stenosis. Patent SCA and PCA origins. Chronic PCA irregularity bilaterally, moderate to severe in the left distal P1 and P2 segments which demonstrate string sign stenosis (series 11, image 22) which is stable from the CTA  last year. Distal left P2, and other left PCA branches remain patent and appear more normal. No high-grade right PCA stenosis. Anterior circulation: Both ICA siphons are patent. Distal left siphon calcified plaque is mild without stenosis. Right siphon cavernous and supraclinoid calcified plaque with mild right supraclinoid ICA stenosis. Patent carotid termini. Posterior communicating arteries are diminutive or absent. Patent MCA and ACA origins with dominant right and diminutive left A1 segments. Normal anterior communicating artery. Bilateral ACA branches are stable and within normal limits. Left MCA M1 segment and bifurcation are patent without stenosis. Right MCA M1 segment and bifurcation are patent without stenosis. Bilateral MCA branches are stable. And there is moderate to severe right M3  or M4 stenosis on series 13, image 13. Venous sinuses: Early contrast timing, not evaluated. Anatomic variants: Dominant right ACA A1. Review of the MIP images confirms the above findings IMPRESSION: 1. Negative for emergent large vessel occlusion. Positive for stable chronically severe stenoses of the: - Left PCA P1/P2 (string sign stenosis). - Right MCA posterior M3/M4 branch. 2. Mild ICA siphon calcified plaque with mild right supraclinoid ICA stenosis. Minimal extracranial atherosclerosis, no significant extracranial stenosis. 3.  Aortic Atherosclerosis (ICD10-I70.0). Electronically Signed   By: Odessa Fleming M.D.   On: 10/30/2022 08:16   MR BRAIN WO CONTRAST  Result Date: 10/29/2022 CLINICAL DATA:  Frequent falls.  Acute neurologic deficit. EXAM: MRI HEAD WITHOUT CONTRAST TECHNIQUE: Multiplanar, multiecho pulse sequences of the brain and surrounding structures were obtained without intravenous contrast. COMPARISON:  02/27/2022 FINDINGS: Brain: There is a small acute infarct within the right mid brain, within the periaqueductal gray matter. There are innumerable chronic microhemorrhages throughout the brain in a mixed  central and peripheral distribution. No acute hemorrhage. There is confluent hyperintense T2-weighted signal within the white matter. Generalized volume loss. The midline structures are normal. Unchanged small meningioma, measuring approximately 9 mm, at the lateral inferior surface of the left tentorial leaflet. Vascular: Normal flow voids Skull and upper cervical spine: No acute finding Sinuses/Orbits: Paranasal sinuses are clear. Bilateral ocular lens replacements. Other: None IMPRESSION: 1. Small acute infarct within the right mid brain, within the periaqueductal gray matter. No hemorrhage or mass effect. 2. Innumerable chronic microhemorrhages throughout the brain in a mixed central and peripheral distribution, concerning for cerebral amyloid angiopathy. Electronically Signed   By: Deatra Robinson M.D.   On: 10/29/2022 23:03   DG Chest Port 1 View  Result Date: 10/29/2022 CLINICAL DATA:  Altered mental status EXAM: PORTABLE CHEST 1 VIEW COMPARISON:  None Available. FINDINGS: Lungs are well expanded, symmetric, and clear. No pneumothorax or pleural effusion. Cardiac size within normal limits. Pulmonary vascularity is normal. Osseous structures are age-appropriate. No acute bone abnormality. IMPRESSION: No active disease. Electronically Signed   By: Helyn Numbers M.D.   On: 10/29/2022 19:52   CT Head Wo Contrast  Result Date: 10/29/2022 CLINICAL DATA:  Fall EXAM: CT HEAD WITHOUT CONTRAST TECHNIQUE: Contiguous axial images were obtained from the base of the skull through the vertex without intravenous contrast. RADIATION DOSE REDUCTION: This exam was performed according to the departmental dose-optimization program which includes automated exposure control, adjustment of the mA and/or kV according to patient size and/or use of iterative reconstruction technique. COMPARISON:  03/11/2021 FINDINGS: Brain: There is no mass, hemorrhage or extra-axial collection. There is generalized atrophy without lobar  predilection. Hypodensity of the white matter is most commonly associated with chronic microvascular disease. Vascular: Atherosclerotic calcification of the vertebral and internal carotid arteries at the skull base. No abnormal hyperdensity of the major intracranial arteries or dural venous sinuses. Skull: The visualized skull base, calvarium and extracranial soft tissues are normal. Sinuses/Orbits: No fluid levels or advanced mucosal thickening of the visualized paranasal sinuses. No mastoid or middle ear effusion. The orbits are normal. IMPRESSION: 1. No acute intracranial abnormality. 2. Generalized atrophy and findings of chronic microvascular disease. Electronically Signed   By: Deatra Robinson M.D.   On: 10/29/2022 19:39      LOS: 1 day    Joycelyn Das, MD [facility] Available via Epic secure chat 7am-7pm After these hours, please refer to coverage provider listed on [email or web address] 10/31/2022, 10:26 AM

## 2022-10-31 NOTE — Progress Notes (Signed)
STROKE TEAM PROGRESS NOTE   BRIEF HPI Mr. James Henderson is a 72 y.o. male with PMH significant for prior strokes, DM2, HTN, HLD, prior meningioma who presents with increased falls along with dragging his left leg. His neurologic examination was notable for mild to moderate aphasia, dysarthria, dysconjugate gaze and L sided weakness and incoordination. He was found to have a small R midbrain stroke. CTA negative.   SIGNIFICANT HOSPITAL EVENTS 9/9: MRI shows R midbrain stroke, chronic microhemorrhages 9/10:  PT/OT recommend CIR  INTERIM HISTORY/SUBJECTIVE Patient sitting up in chair.  His son is at bedside. Discussed plan of care and assessment.   He has been able to ambulate with a walker.  Therapist recommend inpatient rehab.  Patient seems agreeable.  Vital signs stable.  Neurological exam unchanged.   OBJECTIVE  CBC    Component Value Date/Time   WBC 4.8 10/30/2022 0539   RBC 3.79 (L) 10/30/2022 0539   HGB 11.0 (L) 10/30/2022 0539   HCT 35.2 (L) 10/30/2022 0539   PLT 210 10/30/2022 0539   MCV 92.9 10/30/2022 0539   MCH 29.0 10/30/2022 0539   MCHC 31.3 10/30/2022 0539   RDW 13.2 10/30/2022 0539   LYMPHSABS 1.6 10/29/2022 1647   MONOABS 0.4 10/29/2022 1647   EOSABS 0.0 10/29/2022 1647   BASOSABS 0.0 10/29/2022 1647    BMET    Component Value Date/Time   NA 142 10/30/2022 0539   K 3.5 10/30/2022 0539   CL 107 10/30/2022 0539   CO2 24 10/30/2022 0539   GLUCOSE 123 (H) 10/30/2022 0539   BUN 18 10/30/2022 0539   CREATININE 1.51 (H) 10/30/2022 0539   CALCIUM 8.9 10/30/2022 0539   GFRNONAA 48 (L) 10/30/2022 0539    IMAGING past 24 hours ECHOCARDIOGRAM COMPLETE  Result Date: 10/31/2022    ECHOCARDIOGRAM REPORT   Patient Name:   BRONISLAUS JEFF Date of Exam: 10/31/2022 Medical Rec #:  [id number]      Height:       66.0 in Accession #:    [id number]     Weight:       164.9 lb Date of Birth:  1950/04/08       BSA:          1.842 m Patient Age:    72 years       BP:            121/63 mmHg Patient Gender: M              HR:           66 bpm. Exam Location:  Inpatient Procedure: 2D Echo, Cardiac Doppler and Color Doppler Indications:    Stroke I63.9  History:        Patient has prior history of Echocardiogram examinations, most                 recent 03/11/2021. CKD 3 and Stroke; Risk Factors:Hypertension,                 Dyslipidemia, Diabetes and Former Smoker.  Sonographer:    Dondra Prader RVT RCS Referring Phys: [id number] TIMOTHY S OPYD  Sonographer Comments: Suboptimal parasternal window. IMPRESSIONS  1. Left ventricular ejection fraction, by estimation, is 60 to 65%. The left ventricle has normal function. The left ventricle has no regional wall motion abnormalities. There is mild concentric left ventricular hypertrophy. Left ventricular diastolic parameters are consistent with Grade I diastolic dysfunction (impaired relaxation).  2. Right ventricular systolic function is normal. The right  ventricular size is normal. Tricuspid regurgitation signal is inadequate for assessing PA pressure.  3. The mitral valve is normal in structure. No evidence of mitral valve regurgitation.  4. Aortic valve regurgitation is not visualized. Aortic valve sclerosis/calcification is present, without any evidence of aortic stenosis.  5. The inferior vena cava is normal in size with greater than 50% respiratory variability, suggesting right atrial pressure of 3 mmHg. FINDINGS  Left Ventricle: Left ventricular ejection fraction, by estimation, is 60 to 65%. The left ventricle has normal function. The left ventricle has no regional wall motion abnormalities. The left ventricular internal cavity size was normal in size. There is  mild concentric left ventricular hypertrophy. Left ventricular diastolic parameters are consistent with Grade I diastolic dysfunction (impaired relaxation). Right Ventricle: The right ventricular size is normal. Right ventricular systolic function is normal. Tricuspid regurgitation signal  is inadequate for assessing PA pressure. Left Atrium: Left atrial size was normal in size. Right Atrium: Right atrial size was normal in size. Pericardium: There is no evidence of pericardial effusion. Mitral Valve: The mitral valve is normal in structure. No evidence of mitral valve regurgitation. Tricuspid Valve: Tricuspid valve regurgitation is not demonstrated. Aortic Valve: Aortic valve regurgitation is not visualized. Aortic valve sclerosis/calcification is present, without any evidence of aortic stenosis. Aortic valve mean gradient measures 5.0 mmHg. Aortic valve peak gradient measures 8.9 mmHg. Aortic valve  area, by VTI measures 2.05 cm. Pulmonic Valve: The pulmonic valve was not well visualized. Pulmonic valve regurgitation is not visualized. Aorta: The aortic root and ascending aorta are structurally normal, with no evidence of dilitation. Venous: The inferior vena cava is normal in size with greater than 50% respiratory variability, suggesting right atrial pressure of 3 mmHg. IAS/Shunts: No atrial level shunt detected by color flow Doppler.  LEFT VENTRICLE PLAX 2D LVIDd:         3.40 cm   Diastology LVIDs:         2.40 cm   LV e' medial:    7.18 cm/s LV PW:         1.10 cm   LV E/e' medial:  12.4 LV IVS:        1.10 cm   LV e' lateral:   5.77 cm/s LVOT diam:     1.90 cm   LV E/e' lateral: 15.4 LV SV:         61 LV SV Index:   33 LVOT Area:     2.84 cm  RIGHT VENTRICLE             IVC RV Basal diam:  4.10 cm     IVC diam: 1.40 cm RV S prime:     11.90 cm/s TAPSE (M-mode): 2.1 cm LEFT ATRIUM             Index        RIGHT ATRIUM           Index LA diam:        2.90 cm 1.57 cm/m   RA Area:     11.20 cm LA Vol (A2C):   31.7 ml 17.21 ml/m  RA Volume:   27.00 ml  14.66 ml/m LA Vol (A4C):   39.3 ml 21.33 ml/m LA Biplane Vol: 36.2 ml 19.65 ml/m  AORTIC VALVE                     PULMONIC VALVE AV Area (Vmax):    2.15 cm      PV  Vmax:       0.90 m/s AV Area (Vmean):   2.09 cm      PV Peak grad:  3.3 mmHg  AV Area (VTI):     2.05 cm AV Vmax:           149.00 cm/s AV Vmean:          102.000 cm/s AV VTI:            0.296 m AV Peak Grad:      8.9 mmHg AV Mean Grad:      5.0 mmHg LVOT Vmax:         113.00 cm/s LVOT Vmean:        75.300 cm/s LVOT VTI:          0.214 m LVOT/AV VTI ratio: 0.72  AORTA Ao Root diam: 3.00 cm Ao Asc diam:  3.40 cm MITRAL VALVE MV Area (PHT): 2.56 cm     SHUNTS MV Decel Time: 296 msec     Systemic VTI:  0.21 m MV E velocity: 88.70 cm/s   Systemic Diam: 1.90 cm MV A velocity: 139.00 cm/s MV E/A ratio:  0.64 Photographer signed by Carolan Clines Signature Date/Time: 10/31/2022/9:06:34 AM    Final     Vitals:   10/30/22 2337 10/31/22 0500 10/31/22 0732 10/31/22 1116  BP: 120/75 121/63 119/69 116/63  Pulse: 78 70 66 66  Resp: 18 19 18 18   Temp: 98.4 F (36.9 C) 98.2 F (36.8 C) 98.3 F (36.8 C) 98.8 F (37.1 C)  TempSrc: Oral Oral Oral Oral  SpO2: 100% 100% 100% 100%  Weight:      Height:         PHYSICAL EXAM General: Frail elderly African American male in no acute distress Psych:  Mood and affect appropriate for situation CV: Regular rate and rhythm on monitor Respiratory:  Regular, unlabored respirations on room air GI: Abdomen soft and nontender   NEURO:  Mental Status: AA&Ox3, patient is able to give clear and coherent history Speech/Language: speech is slurred with pseudobulbar dysarthria.  Naming, repetition, fluency, and comprehension intact.  Cranial Nerves:  II: PERRL. Visual fields full.  III, IV, VI: EOMI. Eyelids elevate symmetrically.  V: Sensation is intact to light touch and symmetrical to face.  VII: Face is symmetrical resting and smiling VIII: hearing intact to voice. IX, X: Palate elevates symmetrically. Phonation is normal.  WN:UUVOZDGU shrug 5/5. XII: tongue is midline without fasciculations. Motor: 4/5 LUE, LLE. 4-/5 grip. 4+/5 Left dorsal and plantar flexion.  Weakness of left grip and nonfixed flexion contractures of the  left hand fingers.  Mild weakness of left hip flexors and ankle dorsiflexors.  Tone: is increased on the left with mild spasticity.  Sensation- Intact to light touch bilaterally. Extinction absent to light touch to DSS.   Coordination: FTN with ataxia on left. RAM slow on left, decreased FMM on left.  Gait- deferred for patient safety.       ASSESSMENT/PLAN  Acute Ischemic Infarct:  right midbrain Etiology:  likely small vessel disease  Code Stroke CT head: No acute intracranial abnormality Generalized atrophy and chronic microhemorrhages  CTA head & neck  Negative for LVO.  Chronically severe stenosis of Left PCA P1/P2  MRI   Small acute infarct within right midbrain within gray matter.  No hemorrhage or mass effect.  Innumerable chronic microhemorrhages, concerning for cerebral amyloid angiopathy  2D Echo: Ejection fraction 60 to 65%.  Mild concentric left ventricular hypertrophy.Normal left  atrial size LDL 35 HgbA1c 7.2 VTE prophylaxis - lovenox Plavix prior to admission, now on aspirin and brilinta for 4 weeks, then aspirin alone.  Therapy recommendations:  pending Disposition:  pending  Hx of Stroke/TIA 2015/2019 Posterior Circulation Strokes 2023 subacute left frontal white matter infarct Discharged on Plavix  Hypertension Home meds:  amlodipine 10mg , losartan 100mg  Stable Blood Pressure Goal: BP less than 220/110. Gradually normalize over 24-48 hours, long term goal normotensive.   Hyperlipidemia Home meds:  crestor 40mg , zetia 10mg  LDL 35, goal < 70 Continue statin at discharge  Diabetes type II Uncontrolled Home meds:  metformin 1000mg  in am/500mg  in evening HgbA1c 7.2, goal < 7.0 CBGs SSI Recommend close follow-up with PCP for better DM control  Tobacco Abuse Former cigarette smoker  Other Stroke Risk Factors Family hx stroke (mother, brother)   Other Active Problems Dementia Aricept 10mg , Namenda 10mg ; restarted  Hospital day # 1   Patient  presented with sudden onset of dizziness and gait ataxia secondary to small right midbrain paramedian infarct likely from small vessel disease.  He has baseline mild dementia as well as some left-sided deficits from a previous stroke.  Recommend aspirin and Brilinta for 4 weeks followed by aspirin alone and discontinue Plavix.     Mobilize out of bed.  Physical occupational and speech therapy consults.  He will likely need inpatient rehab after insurance approval.  Long discussion with patient and his son   at the bedside and answered questions.  Discussed with Dr.Pokhrel.  Stroke team will sign off.  Kindly call for questions.  Greater than 50% time during this 35-minute visit was spent in counseling and coordination of care about his lacunar stroke and discussion about his baseline deficits and gait ataxia and need for aggressive risk factor modification and rehabilitation and answering questions.  Delia Heady, MD Medical Director [facility] Pager: [phone] 10/31/2022 1:31 PM   To contact Stroke Continuity provider, please refer to [email or web address]. After hours, contact General Neurology

## 2022-10-31 NOTE — PMR Pre-admission (Signed)
PMR Admission Coordinator Pre-Admission Assessment   Patient: James Henderson is an 72 y.o., male MRN: [id number] DOB: [birth year] Height: 5\' 6"  (167.6 cm) Weight: 74.8 kg   Insurance Information HMO: ***    PPO: ***     PCP: ***     IPA: ***     80/20: ***     OTHER: *** PRIMARY: [facility]      Policy#: [id number]       Subscriber: *** CM Name: ***      Phone#: ***     Fax#: *** Pre-Cert#: ***      Employer: *** Benefits:  Phone #: ***     Name: *** Employer:  Benefits:  Phone #:      Name:  Eff. Date:       Deduct: n/a      Out of Pocket Max: n/a      Life Max:  CIR: 100%      SNF:  Outpatient:      Co-Pay:  Home Health:       Co-Pay:  DME:      Co-Pay:  Providers:  SECONDARY:       Policy#:      Phone#:    Artist:       Phone#:    The Data processing manager" for patients in Inpatient Rehabilitation Facilities with attached "Privacy Act Statement-Health Care Records" was provided and verbally reviewed with: Patient and Family   Emergency Contact Information Contact Information       Name Relation Home Work Mobile    [place] Spouse [phone]   [phone]         Other Contacts   None on File        Current Medical History  Patient Admitting Diagnosis: CVA History of Present Illness: James Henderson is a pleasant 72 y.o. male with medical history significant for type 2 diabetes mellitus, hypertension, hyperlipidemia, and history of CVA with residual aphasia and left-sided weakness presented to the [facility]  ED on 10/29/22 with recurrent falls total of 4 in 24 hours with  increased left-sided weakness.  Normally patient is able to ambulate with a walker.  In the ED patient had stable vitals.  EKG showed normal sinus rhythm.  CT scan of the head negative for acute abnormality.  MRI of the brain showed acute infarct in the right midbrain as well as innumerable chronic microhemorrhages throughout the brain.  Lab was notable  for creatinine of 1.7.  Neurology was consulted from the ED and patient was admitted hospital for further evaluation and treatment.  Pt. Was seen by PT/OT/SLP and they recommended CIR to assist return to PLOF.  Complete NIHSS TOTAL: 3   Patient's medical record from [facility]  has been reviewed by the rehabilitation admission coordinator and physician.   Past Medical History      Past Medical History:  Diagnosis Date   Brainstem stroke (HCC) 2015   Cerebral meningioma (HCC)     Diabetes mellitus without complication (HCC)     Glaucoma     Hyperlipidemia     Hypertension            Has the patient had major surgery during 100 days prior to admission? No   Family History   family history includes Diabetes type II in his brother; Stroke in his brother and mother.   Current Medications  Current Medications    Current Facility-Administered Medications:  acetaminophen (TYLENOL) tablet 650 mg, 650 mg, Oral, Q4H PRN **OR** acetaminophen (TYLENOL) 160 MG/5ML solution 650 mg, 650 mg, Per Tube, Q4H PRN **OR** acetaminophen (TYLENOL) suppository 650 mg, 650 mg, Rectal, Q4H PRN, Opyd, Lavone Neri, MD   amLODipine (NORVASC) tablet 10 mg, 10 mg, Oral, Daily, Richardo Priest, Erin C, NP, 10 mg at 10/31/22 0915   aspirin EC tablet 81 mg, 81 mg, Oral, Daily, Erick Blinks, MD, 81 mg at 10/31/22 0915   brinzolamide (AZOPT) 1 % ophthalmic suspension 1 drop, 1 drop, Both Eyes, TID **AND** brimonidine (ALPHAGAN) 0.2 % ophthalmic solution 1 drop, 1 drop, Both Eyes, TID, Pokhrel, Laxman, MD   donepezil (ARICEPT) tablet 10 mg, 10 mg, Oral, QHS, Lehner, Erin C, NP, 10 mg at 10/30/22 2159   ezetimibe (ZETIA) tablet 10 mg, 10 mg, Oral, Daily, Richardo Priest, Erin C, NP, 10 mg at 10/31/22 0915   fesoterodine (TOVIAZ) tablet 4 mg, 4 mg, Oral, Daily, Pokhrel, Laxman, MD   insulin aspart (novoLOG) injection 0-5 Units, 0-5 Units, Subcutaneous, QHS, Opyd, Timothy S, MD   insulin aspart (novoLOG) injection  0-6 Units, 0-6 Units, Subcutaneous, TID WC, Opyd, Lavone Neri, MD, 2 Units at 10/30/22 1239   memantine (NAMENDA) tablet 10 mg, 10 mg, Oral, BID, Richardo Priest, Erin C, NP, 10 mg at 10/31/22 0915   mirabegron ER (MYRBETRIQ) tablet 25 mg, 25 mg, Oral, Daily, Pokhrel, Laxman, MD   rosuvastatin (CRESTOR) tablet 40 mg, 40 mg, Oral, q1800, Hetty Blend C, NP, 40 mg at 10/30/22 1745   senna-docusate (Senokot-S) tablet 1 tablet, 1 tablet, Oral, QHS PRN, Opyd, Timothy S, MD   sodium chloride 0.9 % bolus 1,000 mL, 1,000 mL, Intravenous, Once, Opyd, Lavone Neri, MD   ticagrelor (BRILINTA) tablet 90 mg, 90 mg, Oral, BID, Hetty Blend C, NP, 90 mg at 10/31/22 0915     Patients Current Diet:  Diet Order                  Diet heart healthy/carb modified Room service appropriate? Yes; Fluid consistency: Thin  Diet effective now                         Precautions / Restrictions Precautions Precautions: Fall Precaution Comments: L hemi Restrictions Weight Bearing Restrictions: No    Has the patient had 2 or more falls or a fall with injury in the past year? Yes   Prior Activity Level Community (5-7x/wk): pt. active in the community PTA   Prior Functional Level Self Care: Did the patient need help bathing, dressing, using the toilet or eating? Independent   Indoor Mobility: Did the patient need assistance with walking from room to room (with or without device)? Independent   Stairs: Did the patient need assistance with internal or external stairs (with or without device)? Independent   Functional Cognition: Did the patient need help planning regular tasks such as shopping or remembering to take medications? Dependent   Patient Information Are you of Hispanic, Latino/a,or Spanish origin?: A. No, not of Hispanic, Latino/a, or Spanish origin What is your race?: B. Black or African American Do you need or want an interpreter to communicate with a doctor or health care staff?: 0. No   Patient's  Response To:  Health Literacy and Transportation Is the patient able to respond to health literacy and transportation needs?: Yes Health Literacy - How often do you need to have someone help you when you read instructions, pamphlets, or other written material from your  doctor or pharmacy?: Sometimes In the past 12 months, has lack of transportation kept you from medical appointments or from getting medications?: No In the past 12 months, has lack of transportation kept you from meetings, work, or from getting things needed for daily living?: No   Home Assistive Devices / Equipment Home Assistive Devices/Equipment: Environmental consultant (specify type), CBG Meter, Blood pressure cuff Home Equipment: Rollator (4 wheels), Cane - single point   Prior Device Use: Indicate devices/aids used by the patient prior to current illness, exacerbation or injury? None of the above   Current Functional Level Cognition   Arousal/Alertness: Awake/alert Overall Cognitive Status: No family/caregiver present to determine baseline cognitive functioning Orientation Level: Oriented to person, Oriented to place, Oriented to time, Disoriented to situation General Comments: Pt pleasant, follows simple commands throughout session, is incontinent of urine & reports this is new. Memory: Impaired Memory Impairment: Storage deficit, Retrieval deficit Executive Function: Sequencing Sequencing: Impaired Sequencing Impairment: Verbal basic    Extremity Assessment (includes Sensation/Coordination)   Upper Extremity Assessment: Defer to OT evaluation, LUE deficits/detail LUE Deficits / Details: LUE weakness 2/2 hx of CVA, exacerbated LUE Coordination: decreased fine motor  Lower Extremity Assessment: LLE deficits/detail LLE Deficits / Details: drags LLE during gait, decreased fine motor     ADLs   Overall ADL's : Needs assistance/impaired Eating/Feeding: Modified independent, Sitting Eating/Feeding Details (indicate cue type and  reason): drinking cup and straw Grooming: Wash/dry hands, Set up Upper Body Bathing: Set up, Sitting Upper Body Bathing Details (indicate cue type and reason): cues to initiate but otherwise demonstrates Lower Body Bathing: Minimal assistance, Set up, Sit to/from stand Lower Body Bathing Details (indicate cue type and reason): pt able to complete majority of bathing in sitting position. pt with balance deficits iwth standing requires external support Upper Body Dressing : Set up Lower Body Dressing: Minimal assistance Lower Body Dressing Details (indicate cue type and reason): don socks. pt needed help due to long toe nails and strings in socks. pt able to thread it and decreased fine motor of L hand Toileting - Clothing Manipulation Details (indicate cue type and reason): on arrival pt incontinence of bladder and reports new. pt incontinence in the bed noted as well. OT taking over session due to need for adl with incontinence Functional mobility during ADLs: Rolling walker (2 wheels), Minimal assistance General ADL Comments: pt needs increased time and outside the RW with turns     Mobility   Overal bed mobility: Needs Assistance Bed Mobility: Supine to Sit Supine to sit: Min assist, Mod assist, HOB elevated, Used rails General bed mobility comments: Pt is able to transfer BLE to EOB but requires min<>mod assist to upright trunk.     Transfers   Overall transfer level: Needs assistance Equipment used: Rolling walker (2 wheels) Transfers: Sit to/from Stand Sit to Stand: Min assist General transfer comment: STS from EOB & recliner, extra time to power up to standing     Ambulation / Gait / Stairs / Wheelchair Mobility   Ambulation/Gait Ambulation/Gait assistance: Editor, commissioning (Feet): 6 Feet Assistive device: Rolling walker (2 wheels) Gait Pattern/deviations: Decreased step length - right, Decreased step length - left, Decreased dorsiflexion - left, Decreased stride  length General Gait Details: Pt ambulates ~4 ft forwards with RW, 4 ft backwards with min assist 2/2 incontinent void & need to return to recliner. During short distance gait, pt requires cuing to ambulate within base of AD. Pt with decreased LLE hip/knee flexion during swing  phase, decreased L foot clearance (pt dragging foot), with absent dorsiflexion & heel strike. Gait velocity: decreased     Posture / Balance Balance Overall balance assessment: Needs assistance Sitting-balance support: Bilateral upper extremity supported, Feet supported Sitting balance-Leahy Scale: Fair Standing balance support: Bilateral upper extremity supported, During functional activity, Reliant on assistive device for balance Standing balance-Leahy Scale: Poor High level balance activites: Turns High Level Balance Comments: high fall risk and outside the RW     Special needs/care consideration Skin *** and Special service needs ***    Previous Home Environment (from acute therapy documentation) Living Arrangements: Spouse/significant other Available Help at Discharge: Family, Available 24 hours/day Type of Home: House Home Layout: One level Home Access: Ramped entrance Bathroom Shower/Tub: Engineer, manufacturing systems: Standard Home Care Services: No   Discharge Living Setting Plans for Discharge Living Setting: Patient's home Type of Home at Discharge: House Discharge Home Layout: One level Discharge Home Access: Ramped entrance Discharge Bathroom Shower/Tub: Tub/shower unit Discharge Bathroom Toilet: Standard Discharge Bathroom Accessibility: Yes How Accessible: Accessible via walker, Accessible via wheelchair Does the patient have any problems obtaining your medications?: No   Social/Family/Support Systems Patient Roles: Spouse Contact Information: [phone] Anticipated Caregiver: Vernie Ammons Anticipated Caregiver's Contact Information: Wife works, son is home while she  works. Ability/Limitations of Caregiver: 24/7 Caregiver Availability: 24/7 Discharge Plan Discussed with Primary Caregiver: Yes Is Caregiver In Agreement with Plan?: Yes Does Caregiver/Family have Issues with Lodging/Transportation while Pt is in Rehab?: No   Goals Patient/Family Goal for Rehab: PT/OT/SLP Supervision to Mod I Expected length of stay: 7-10 days Pt/Family Agrees to Admission and willing to participate: Yes Program Orientation Provided & Reviewed with Pt/Caregiver Including Roles  & Responsibilities: Yes   Decrease burden of Care through IP rehab admission: not anticipated   Possible need for SNF placement upon discharge: not anticiapted    Patient Condition: {PATIENT'S CONDITION:22832}   Preadmission Screen Completed By:  Jeronimo Greaves, 10/31/2022 12:10 PM ______________________________________________________________________   Discussed status with Dr. Marland Kitchen on *** at *** and received approval for admission today.   Admission Coordinator:  Jeronimo Greaves, CCC-SLP, time Marland KitchenDorna Bloom ***

## 2022-10-31 NOTE — Progress Notes (Signed)
SLP Cancellation Note  Patient Details Name: James Henderson MRN: [id number] DOB: [birth year]   Cancelled treatment:       Reason Eval/Treat Not Completed: Patient at procedure or test/unavailable  James Henderson, M.S., CCC-SLP Speech-Language Pathologist Secure Chat Preferred  O: [phone]  James Henderson 10/31/2022, 8:14 AM

## 2022-10-31 NOTE — Progress Notes (Signed)
*  PRELIMINARY RESULTS* Echocardiogram 2D Echocardiogram has been performed.  James Henderson 10/31/2022, 8:42 AM

## 2022-10-31 NOTE — Evaluation (Signed)
Speech Language Pathology Evaluation Patient Details Name: James Henderson MRN: [id number] DOB: 1950-11-21 Today's Date: 10/31/2022 Time: 3762-8315 SLP Time Calculation (min) (ACUTE ONLY): 12 min  Problem List:  Patient Active Problem List   Diagnosis Date Noted   CVA (cerebral vascular accident) (HCC) 10/30/2022   Acute ischemic stroke (HCC) 10/29/2022   Mixed Alzheimer's and vascular dementia (HCC) 10/29/2022   Unsteady gait 03/10/2021   Left-sided weakness 09/25/2020   AKI (acute kidney injury) (HCC) 09/24/2020   Diabetes mellitus type 2 in nonobese (HCC)    Benign essential HTN    History of CVA (cerebrovascular accident)    Acute blood loss anemia    CKD stage 3a, GFR 45-59 ml/min (HCC)    Acute CVA (cerebrovascular accident) (HCC) 10/21/2017   HLD (hyperlipidemia) 01/10/2014   Acute brainstem infarction (HCC) 12/07/2013   Dizziness 12/06/2013   DM (diabetes mellitus) type II controlled, neurological manifestation (HCC) 12/06/2013   HYPERLIPIDEMIA 02/26/2006   GLAUCOMA NOS 02/26/2006   Essential hypertension 02/26/2006   Past Medical History:  Past Medical History:  Diagnosis Date   Brainstem stroke (HCC) 2015   Cerebral meningioma (HCC)    Diabetes mellitus without complication (HCC)    Glaucoma    Hyperlipidemia    Hypertension    Past Surgical History:  Past Surgical History:  Procedure Laterality Date   BIOPSY  02/15/2020   Procedure: BIOPSY;  Surgeon: Kerin Salen, MD;  Location: [facility];  Service: Gastroenterology;;   COLONOSCOPY WITH PROPOFOL N/A 02/15/2020   Procedure: COLONOSCOPY WITH PROPOFOL;  Surgeon: Kerin Salen, MD;  Location: [facility];  Service: Gastroenterology;  Laterality: N/A;   EYE SURGERY     POLYPECTOMY  02/15/2020   Procedure: POLYPECTOMY;  Surgeon: Kerin Salen, MD;  Location: [facility];  Service: Gastroenterology;;   HPI:  72 yo male moderate aphasia dysarthria dysconjugate gaze and L side weakness. Imaging reveal R midbrain  stroke PMH CVA L parietal infarct, CVA brainstem,DM2, 11 mm meningioma, dementia,glaucoma HTN HLD CKDIII   Assessment / Plan / Recommendation Clinical Impression  Pt seen for speech/language/cognitive communication evaluation. Assessment completed via informal means. Pt presents with s/sx dysarthria c/b slow, imprecise speech which affects speech intelligibility across all levels. Pt approx 80% intelligible to an unfamiliar listener during informal exchanges.  Intermittent anomia noted during conversation and confrontation naming. Pt benefited from extra time for wordfinding and to express basic information, Pt with mild difficulty answering basic and complex yes/no questions. Pt follows 1-step commands with extra time. Pt oriented to self, place, and time. Disoriented to situation. Impaired immediate, short term, and working memory noted. ?baseline level of functional given hx of mixed Alzheimer's and vascular dementia. Recommend continued SLP services acute and post-acute for above mentioned deficits and for further functional/dynamic assessment.    SLP Assessment  SLP Recommendation/Assessment: Patient needs continued Speech Lanaguage Pathology Services SLP Visit Diagnosis: Dysarthria and anarthria (R47.1);Cognitive communication deficit (R41.841);Aphasia (R47.01)    Recommendations for follow up therapy are one component of a multi-disciplinary discharge planning process, led by the attending physician.  Recommendations may be updated based on patient status, additional functional criteria and insurance authorization.    Follow Up Recommendations  Acute inpatient rehab (3hours/day)    Assistance Recommended at Discharge  Frequent or constant Supervision/Assistance  Functional Status Assessment Patient has had a recent decline in their functional status and demonstrates the ability to make significant improvements in function in a reasonable and predictable amount of time.  Frequency and  Duration min 2x/week  2  weeks      SLP Evaluation Cognition  Overall Cognitive Status: No family/caregiver present to determine baseline cognitive functioning Arousal/Alertness: Awake/alert Orientation Level: Oriented to person;Oriented to place;Oriented to time;Disoriented to situation Memory: Impaired Memory Impairment: Storage deficit;Retrieval deficit Executive Function: Sequencing Sequencing: Impaired Sequencing Impairment: Advertising account planner Comprehension Overall Auditory Comprehension: Impaired Yes/No Questions: Impaired Commands: Impaired Interfering Components: Working Radio broadcast assistant: Producer, television/film/video Discrimination: Not tested Reading Comprehension Reading Status: Not tested    Expression Expression Primary Mode of Expression: Verbal Verbal Expression Overall Verbal Expression: Impaired Initiation: No impairment Automatic Speech: Name;Social Response (WFL) Level of Generative/Spontaneous Verbalization: Phrase;Sentence Naming: Impairment Confrontation: Impaired Divergent:  (impaired) Verbal Errors:  (inconsistent awareness) Pragmatics: Impairment Impairments: Topic maintenance Interfering Components: Speech intelligibility Written Expression Dominant Hand: Right Written Expression: Not tested   Oral / Motor  Oral Motor/Sensory Function Overall Oral Motor/Sensory Function: Mild impairment Facial ROM: Reduced right Facial Symmetry: Abnormal symmetry right Motor Speech Overall Motor Speech: Impaired Respiration: Within functional limits Phonation: Low vocal intensity Resonance: Within functional limits Articulation: Impaired Level of Impairment:  (all levels) Intelligibility: Intelligibility reduced Phrase: 75-100% accurate Sentence: 75-100% accurate Conversation: 75-100% accurate Interfering Components: Inadequate dentition (Haitian French-influenced  English) Effective Techniques: Slow rate;Increased vocal intensity           Clyde Canterbury, M.S., CCC-SLP Speech-Language Pathologist Secure Chat Preferred  O: [phone]  Woodroe Chen 10/31/2022, 10:21 AM

## 2022-10-31 NOTE — Plan of Care (Signed)
  Problem: Education: Goal: Ability to describe self-care measures that may prevent or decrease complications (Diabetes Survival Skills Education) will improve Outcome: Progressing Goal: Individualized Educational Video(s) Outcome: Progressing   

## 2022-10-31 NOTE — Progress Notes (Signed)
Physical Therapy Treatment  Patient Details Name: James Henderson MRN: [id number] DOB: 01-19-1951 Today's Date: 10/31/2022   History of Present Illness Pt is a 72 y/o M admitted on 10/29/22 after presenting with c/o recurrent falls & increased L sided weakness. Pt was found to have R midbrain stroke. PMH: DM2, HTN, HLD, CVA with residual aphasia & L sided weakness, cerebral meningioma, glaucoma    PT Comments  Pt progressing towards physical therapy goals. Was able to perform transfers and ambulation with gross min assist and RW for support. Pt with improved control of LLE this session during gait and demonstrating good advancement of LLE. Mobility limited by incontinence, as pt pulled condom catheter off prior to session beginning. NT present to replace during session. Will continue to follow and progress as able per POC.     If plan is discharge home, recommend the following: A little help with walking and/or transfers;A little help with bathing/dressing/bathroom;Assistance with cooking/housework;Assist for transportation;Help with stairs or ramp for entrance   Can travel by private vehicle     Yes  Equipment Recommendations  Rolling walker (2 wheels);BSC/3in1    Recommendations for Other Services       Precautions / Restrictions Precautions Precautions: Fall Precaution Comments: L hemi Restrictions Weight Bearing Restrictions: No     Mobility  Bed Mobility Overal bed mobility: Needs Assistance Bed Mobility: Supine to Sit     Supine to sit: Min assist, HOB elevated, Used rails     General bed mobility comments: Pt initiating transfer to EOB. Min assist for trunk support as pt scoots out to get feet on the floor.    Transfers Overall transfer level: Needs assistance Equipment used: Rolling walker (2 wheels) Transfers: Sit to/from Stand Sit to Stand: Min assist           General transfer comment: VC's for hand placement on seated surface for safety. Min assist for  power up to full stand and to gain/maintain standing balance.    Ambulation/Gait Ambulation/Gait assistance: Min assist Gait Distance (Feet): 10 Feet Assistive device: Rolling walker (2 wheels) Gait Pattern/deviations: Decreased step length - right, Decreased step length - left, Decreased dorsiflexion - left, Decreased stride length Gait velocity: decreased Gait velocity interpretation: <1.31 ft/sec, indicative of household ambulator   General Gait Details: Pt ambulated around the bed to the chair. Pt with incontinence episode during gait training and session focus shifted to clean up and linen change. Pt demonstrated the ability to advance LLE well when cued and pt concentrating on movement.   Stairs             Wheelchair Mobility     Tilt Bed    Modified Rankin (Stroke Patients Only) Modified Rankin (Stroke Patients Only) Pre-Morbid Rankin Score: Moderate disability Modified Rankin: Moderately severe disability     Balance Overall balance assessment: Needs assistance Sitting-balance support: Bilateral upper extremity supported, Feet supported Sitting balance-Leahy Scale: Fair     Standing balance support: Bilateral upper extremity supported, During functional activity, Reliant on assistive device for balance Standing balance-Leahy Scale: Poor                 High Level Balance Comments: high fall risk and outside the RW            Cognition Arousal: Alert Behavior During Therapy: [facility] for tasks assessed/performed Overall Cognitive Status: No family/caregiver present to determine baseline cognitive functioning  Exercises Other Exercises Other Exercises: 5x sit<>stand with focus on upright posture in standing and reaching back for arm rests before initiating sitting.    General Comments        Pertinent Vitals/Pain Pain Assessment Pain Assessment: No/denies pain    Home Living                           Prior Function            PT Goals (current goals can now be found in the care plan section) Acute Rehab PT Goals Patient Stated Goal: get better PT Goal Formulation: With patient Time For Goal Achievement: 11/13/22 Potential to Achieve Goals: Good Progress towards PT goals: Progressing toward goals    Frequency    Min 1X/week      PT Plan      Co-evaluation              AM-PAC PT "6 Clicks" Mobility   Outcome Measure  Help needed turning from your back to your side while in a flat bed without using bedrails?: A Little Help needed moving from lying on your back to sitting on the side of a flat bed without using bedrails?: A Lot Help needed moving to and from a bed to a chair (including a wheelchair)?: A Little Help needed standing up from a chair using your arms (e.g., wheelchair or bedside chair)?: A Little Help needed to walk in hospital room?: A Little Help needed climbing 3-5 steps with a railing? : A Lot 6 Click Score: 16    End of Session     Patient left: in chair;with call bell/phone within reach;with chair alarm set;with family/visitor present Nurse Communication: Mobility status PT Visit Diagnosis: Unsteadiness on feet (R26.81);Other abnormalities of gait and mobility (R26.89);Difficulty in walking, not elsewhere classified (R26.2);Muscle weakness (generalized) (M62.81);Hemiplegia and hemiparesis Hemiplegia - Right/Left: Left Hemiplegia - dominant/non-dominant: Non-dominant Hemiplegia - caused by: Cerebral infarction     Time: 1610-9604 PT Time Calculation (min) (ACUTE ONLY): 29 min  Charges:    $Gait Training: 8-22 mins $Therapeutic Activity: 8-22 mins PT General Charges $$ ACUTE PT VISIT: 1 Visit                     Conni Slipper, PT, DPT Acute Rehabilitation Services Secure Chat Preferred Office: [phone]    James Henderson 10/31/2022, 2:37 PM

## 2022-11-01 ENCOUNTER — Inpatient Hospital Stay (HOSPITAL_COMMUNITY)
Admission: RE | Admit: 2022-11-01 | Discharge: 2022-11-13 | DRG: 057 | Disposition: A | Discharge status: Home / Self Care | Payer: Medicare (Managed Care) | Source: Intra-hospital | Attending: Physical Medicine and Rehabilitation | Admitting: Physical Medicine and Rehabilitation

## 2022-11-01 ENCOUNTER — Encounter (HOSPITAL_COMMUNITY): Payer: Self-pay | Admitting: Physical Medicine and Rehabilitation

## 2022-11-01 ENCOUNTER — Other Ambulatory Visit: Payer: Self-pay

## 2022-11-01 DIAGNOSIS — N3281 Overactive bladder: Secondary | ICD-10-CM | POA: Diagnosis present

## 2022-11-01 DIAGNOSIS — F0280 Dementia in other diseases classified elsewhere without behavioral disturbance: Secondary | ICD-10-CM | POA: Diagnosis present

## 2022-11-01 DIAGNOSIS — H409 Unspecified glaucoma: Secondary | ICD-10-CM | POA: Diagnosis present

## 2022-11-01 DIAGNOSIS — I10 Essential (primary) hypertension: Secondary | ICD-10-CM | POA: Diagnosis present

## 2022-11-01 DIAGNOSIS — Z79899 Other long term (current) drug therapy: Secondary | ICD-10-CM

## 2022-11-01 DIAGNOSIS — N183 Chronic kidney disease, stage 3 unspecified: Secondary | ICD-10-CM | POA: Diagnosis present

## 2022-11-01 DIAGNOSIS — N179 Acute kidney failure, unspecified: Secondary | ICD-10-CM | POA: Diagnosis present

## 2022-11-01 DIAGNOSIS — E1122 Type 2 diabetes mellitus with diabetic chronic kidney disease: Secondary | ICD-10-CM | POA: Diagnosis present

## 2022-11-01 DIAGNOSIS — E1121 Type 2 diabetes mellitus with diabetic nephropathy: Secondary | ICD-10-CM | POA: Diagnosis not present

## 2022-11-01 DIAGNOSIS — N1831 Chronic kidney disease, stage 3a: Secondary | ICD-10-CM | POA: Diagnosis present

## 2022-11-01 DIAGNOSIS — G309 Alzheimer's disease, unspecified: Secondary | ICD-10-CM | POA: Diagnosis present

## 2022-11-01 DIAGNOSIS — I69354 Hemiplegia and hemiparesis following cerebral infarction affecting left non-dominant side: Secondary | ICD-10-CM | POA: Diagnosis present

## 2022-11-01 DIAGNOSIS — I6350 Cerebral infarction due to unspecified occlusion or stenosis of unspecified cerebral artery: Secondary | ICD-10-CM

## 2022-11-01 DIAGNOSIS — I69320 Aphasia following cerebral infarction: Secondary | ICD-10-CM | POA: Diagnosis not present

## 2022-11-01 DIAGNOSIS — I69322 Dysarthria following cerebral infarction: Secondary | ICD-10-CM

## 2022-11-01 DIAGNOSIS — I639 Cerebral infarction, unspecified: Secondary | ICD-10-CM | POA: Diagnosis present

## 2022-11-01 DIAGNOSIS — E663 Overweight: Secondary | ICD-10-CM | POA: Diagnosis present

## 2022-11-01 DIAGNOSIS — Z6826 Body mass index (BMI) 26.0-26.9, adult: Secondary | ICD-10-CM | POA: Diagnosis not present

## 2022-11-01 DIAGNOSIS — Z823 Family history of stroke: Secondary | ICD-10-CM | POA: Diagnosis not present

## 2022-11-01 DIAGNOSIS — E1140 Type 2 diabetes mellitus with diabetic neuropathy, unspecified: Secondary | ICD-10-CM | POA: Diagnosis not present

## 2022-11-01 DIAGNOSIS — R2681 Unsteadiness on feet: Secondary | ICD-10-CM | POA: Diagnosis present

## 2022-11-01 DIAGNOSIS — K5900 Constipation, unspecified: Secondary | ICD-10-CM | POA: Diagnosis not present

## 2022-11-01 DIAGNOSIS — Z87891 Personal history of nicotine dependence: Secondary | ICD-10-CM | POA: Diagnosis not present

## 2022-11-01 DIAGNOSIS — F0150 Vascular dementia without behavioral disturbance: Secondary | ICD-10-CM | POA: Diagnosis present

## 2022-11-01 DIAGNOSIS — E785 Hyperlipidemia, unspecified: Secondary | ICD-10-CM | POA: Diagnosis present

## 2022-11-01 DIAGNOSIS — I129 Hypertensive chronic kidney disease with stage 1 through stage 4 chronic kidney disease, or unspecified chronic kidney disease: Secondary | ICD-10-CM | POA: Diagnosis present

## 2022-11-01 DIAGNOSIS — E1149 Type 2 diabetes mellitus with other diabetic neurological complication: Secondary | ICD-10-CM | POA: Diagnosis present

## 2022-11-01 DIAGNOSIS — Z7984 Long term (current) use of oral hypoglycemic drugs: Secondary | ICD-10-CM | POA: Diagnosis not present

## 2022-11-01 DIAGNOSIS — Z833 Family history of diabetes mellitus: Secondary | ICD-10-CM

## 2022-11-01 DIAGNOSIS — F0390 Unspecified dementia without behavioral disturbance: Secondary | ICD-10-CM | POA: Diagnosis present

## 2022-11-01 DIAGNOSIS — Z7902 Long term (current) use of antithrombotics/antiplatelets: Secondary | ICD-10-CM | POA: Diagnosis not present

## 2022-11-01 DIAGNOSIS — R531 Weakness: Secondary | ICD-10-CM

## 2022-11-01 LAB — BASIC METABOLIC PANEL
Anion gap: 9 (ref 5–15)
BUN: 17 mg/dL (ref 8–23)
CO2: 23 mmol/L (ref 22–32)
Calcium: 8.7 mg/dL — ABNORMAL LOW (ref 8.9–10.3)
Chloride: 107 mmol/L (ref 98–111)
Creatinine, Ser: 1.4 mg/dL — ABNORMAL HIGH (ref 0.61–1.24)
GFR, Estimated: 53 mL/min — ABNORMAL LOW (ref >60)
Glucose, Bld: 126 mg/dL — ABNORMAL HIGH (ref 70–99)
Potassium: 3.8 mmol/L (ref 3.5–5.1)
Sodium: 139 mmol/L (ref 135–145)

## 2022-11-01 LAB — CBC
HCT: 36.8 % — ABNORMAL LOW (ref 39.0–52.0)
Hemoglobin: 11.8 g/dL — ABNORMAL LOW (ref 13.0–17.0)
MCH: 29.4 pg (ref 26.0–34.0)
MCHC: 32.1 g/dL (ref 30.0–36.0)
MCV: 91.8 fL (ref 80.0–100.0)
Platelets: 226 10*3/uL (ref 150–400)
RBC: 4.01 MIL/uL — ABNORMAL LOW (ref 4.22–5.81)
RDW: 13.1 % (ref 11.5–15.5)
WBC: 5 10*3/uL (ref 4.0–10.5)
nRBC: 0 % (ref 0.0–0.2)

## 2022-11-01 LAB — GLUCOSE, CAPILLARY
Glucose-Capillary: 130 mg/dL — ABNORMAL HIGH (ref 70–99)
Glucose-Capillary: 321 mg/dL — ABNORMAL HIGH (ref 70–99)
Glucose-Capillary: 111 mg/dL — ABNORMAL HIGH (ref 70–99)
Glucose-Capillary: 135 mg/dL — ABNORMAL HIGH (ref 70–99)

## 2022-11-01 LAB — MAGNESIUM: Magnesium: 2.3 mg/dL (ref 1.7–2.4)

## 2022-11-01 MED ORDER — ENOXAPARIN SODIUM 40 MG/0.4ML IJ SOSY
40.0000 mg | PREFILLED_SYRINGE | INTRAMUSCULAR | Status: DC
  Administered 2022-11-01 – 2022-11-12 (×12): 40 mg via SUBCUTANEOUS
  Filled 2022-11-01 (×12): qty 0.4

## 2022-11-01 MED ORDER — BRINZOLAMIDE 1 % OP SUSP
1.0000 [drp] | Freq: Three times a day (TID) | OPHTHALMIC | Status: DC
  Administered 2022-11-01 – 2022-11-13 (×34): 1 [drp] via OPHTHALMIC
  Filled 2022-11-01: qty 10

## 2022-11-01 MED ORDER — PROCHLORPERAZINE 25 MG RE SUPP: 12.5000 mg | Freq: Four times a day (QID) | RECTAL | Status: DC | PRN

## 2022-11-01 MED ORDER — SENNOSIDES-DOCUSATE SODIUM 8.6-50 MG PO TABS: 1.0000 | ORAL_TABLET | Freq: Every evening | ORAL | Status: DC | PRN

## 2022-11-01 MED ORDER — AMLODIPINE BESYLATE 10 MG PO TABS
10.0000 mg | ORAL_TABLET | Freq: Every day | ORAL | Status: DC
  Administered 2022-11-02 – 2022-11-13 (×12): 10 mg via ORAL
  Filled 2022-11-01 (×12): qty 1

## 2022-11-01 MED ORDER — GUAIFENESIN-DM 100-10 MG/5ML PO SYRP: 5.0000 mL | ORAL_SOLUTION | Freq: Four times a day (QID) | ORAL | Status: DC | PRN

## 2022-11-01 MED ORDER — ASPIRIN 81 MG PO TBEC
81.0000 mg | DELAYED_RELEASE_TABLET | Freq: Every day | ORAL | Status: DC
  Administered 2022-11-02 – 2022-11-13 (×12): 81 mg via ORAL
  Filled 2022-11-01 (×12): qty 1

## 2022-11-01 MED ORDER — ASPIRIN 81 MG PO TBEC: 81.0000 mg | DELAYED_RELEASE_TABLET | Freq: Every day | ORAL | Status: DC

## 2022-11-01 MED ORDER — PROCHLORPERAZINE EDISYLATE 10 MG/2ML IJ SOLN: 5.0000 mg | Freq: Four times a day (QID) | INTRAMUSCULAR | Status: DC | PRN

## 2022-11-01 MED ORDER — MEMANTINE HCL 10 MG PO TABS
10.0000 mg | ORAL_TABLET | Freq: Two times a day (BID) | ORAL | Status: DC
  Administered 2022-11-01 – 2022-11-13 (×24): 10 mg via ORAL
  Filled 2022-11-01 (×24): qty 1

## 2022-11-01 MED ORDER — INSULIN ASPART 100 UNIT/ML IJ SOLN
0.0000 [IU] | Freq: Three times a day (TID) | INTRAMUSCULAR | Status: DC
  Administered 2022-11-02 – 2022-11-03 (×2): 1 [IU] via SUBCUTANEOUS

## 2022-11-01 MED ORDER — METFORMIN HCL 500 MG PO TABS
500.0000 mg | ORAL_TABLET | Freq: Two times a day (BID) | ORAL | Status: DC
  Administered 2022-11-01 – 2022-11-13 (×24): 500 mg via ORAL
  Filled 2022-11-01 (×24): qty 1

## 2022-11-01 MED ORDER — TICAGRELOR 90 MG PO TABS
90.0000 mg | ORAL_TABLET | Freq: Two times a day (BID) | ORAL | Status: DC
  Administered 2022-11-01 – 2022-11-13 (×24): 90 mg via ORAL
  Filled 2022-11-01 (×25): qty 1

## 2022-11-01 MED ORDER — PROCHLORPERAZINE MALEATE 5 MG PO TABS: 5.0000 mg | ORAL_TABLET | Freq: Four times a day (QID) | ORAL | Status: DC | PRN

## 2022-11-01 MED ORDER — ACETAMINOPHEN 325 MG PO TABS: 650.0000 mg | ORAL_TABLET | ORAL | Status: DC | PRN

## 2022-11-01 MED ORDER — MELATONIN 5 MG PO TABS: 5.0000 mg | ORAL_TABLET | Freq: Every evening | ORAL | Status: DC | PRN

## 2022-11-01 MED ORDER — BISACODYL 10 MG RE SUPP: 10.0000 mg | Freq: Every day | RECTAL | Status: DC | PRN

## 2022-11-01 MED ORDER — DONEPEZIL HCL 10 MG PO TABS
10.0000 mg | ORAL_TABLET | Freq: Every day | ORAL | Status: DC
  Administered 2022-11-01 – 2022-11-12 (×12): 10 mg via ORAL
  Filled 2022-11-01 (×12): qty 1

## 2022-11-01 MED ORDER — FLEET ENEMA RE ENEM: 1.0000 | ENEMA | Freq: Once | RECTAL | Status: DC | PRN

## 2022-11-01 MED ORDER — EZETIMIBE 10 MG PO TABS
10.0000 mg | ORAL_TABLET | Freq: Every day | ORAL | Status: DC
  Administered 2022-11-02 – 2022-11-13 (×12): 10 mg via ORAL
  Filled 2022-11-01 (×12): qty 1

## 2022-11-01 MED ORDER — INSULIN ASPART 100 UNIT/ML IJ SOLN: 0.0000 [IU] | Freq: Every day | INTRAMUSCULAR | Status: DC

## 2022-11-01 MED ORDER — DIPHENHYDRAMINE HCL 25 MG PO CAPS
25.0000 mg | ORAL_CAPSULE | Freq: Four times a day (QID) | ORAL | Status: DC | PRN
  Filled 2022-11-01: qty 1

## 2022-11-01 MED ORDER — SENNOSIDES-DOCUSATE SODIUM 8.6-50 MG PO TABS: 2.0000 | ORAL_TABLET | Freq: Every evening | ORAL | Status: DC | PRN

## 2022-11-01 MED ORDER — ALUM & MAG HYDROXIDE-SIMETH 200-200-20 MG/5ML PO SUSP: 30.0000 mL | ORAL | Status: DC | PRN

## 2022-11-01 MED ORDER — ROSUVASTATIN CALCIUM 20 MG PO TABS
40.0000 mg | ORAL_TABLET | Freq: Every day | ORAL | Status: DC
  Administered 2022-11-01 – 2022-11-12 (×12): 40 mg via ORAL
  Filled 2022-11-01 (×12): qty 2

## 2022-11-01 MED ORDER — BRIMONIDINE TARTRATE 0.2 % OP SOLN
1.0000 [drp] | Freq: Three times a day (TID) | OPHTHALMIC | Status: DC
  Administered 2022-11-01 – 2022-11-13 (×34): 1 [drp] via OPHTHALMIC
  Filled 2022-11-01: qty 5

## 2022-11-01 MED ORDER — MIRABEGRON ER 25 MG PO TB24
25.0000 mg | ORAL_TABLET | Freq: Every day | ORAL | Status: DC
  Administered 2022-11-02 – 2022-11-13 (×12): 25 mg via ORAL
  Filled 2022-11-01 (×12): qty 1

## 2022-11-01 MED ORDER — ACETAMINOPHEN 325 MG PO TABS: 325.0000 mg | ORAL_TABLET | ORAL | Status: DC | PRN

## 2022-11-01 MED ORDER — TICAGRELOR 90 MG PO TABS: 90.0000 mg | ORAL_TABLET | Freq: Two times a day (BID) | ORAL | Status: DC

## 2022-11-01 MED ORDER — FESOTERODINE FUMARATE ER 4 MG PO TB24
4.0000 mg | ORAL_TABLET | Freq: Every day | ORAL | Status: DC
  Administered 2022-11-02 – 2022-11-13 (×12): 4 mg via ORAL
  Filled 2022-11-01 (×12): qty 1

## 2022-11-01 NOTE — Progress Notes (Signed)
IP rehab admissions - We have approval for acute inpatient rehab admission.  Bed available on CIR and will admit to CIR today.  Call me for questions.  [phone]

## 2022-11-01 NOTE — Progress Notes (Signed)
Related encounter: ED to Hosp-Admission (Discharged) from 10/29/2022 in [place] [facility]   Signed     Expand All Collapse All           Physical Medicine and Rehabilitation Consult Reason for Consult: CVA Referring Physician: Joycelyn Das, MD     HPI: James Henderson is a 72 y.o. male with a past medical history significant for prior strokes, DM2, HTN, HLD, prior meningioma, who presents with increased falls along with dragging his left leg. His neurologic examination was notable for mid to moderate aphasia, dysarthria, dysconjugate gaze, and left sided weakness and incoordination. He was found to have a small right midbrain stroke. CTA negative. Physical Medicine & Rehabilitation was consulted to assess candidacy for CIR.       ROS + left sided weakness     Past Medical History:  Diagnosis Date   Brainstem stroke (HCC) 2015   Cerebral meningioma (HCC)     Diabetes mellitus without complication (HCC)     Glaucoma     Hyperlipidemia     Hypertension               Past Surgical History:  Procedure Laterality Date   BIOPSY   02/15/2020    Procedure: BIOPSY;  Surgeon: Kerin Salen, MD;  Location: [facility];  Service: Gastroenterology;;   COLONOSCOPY WITH PROPOFOL N/A 02/15/2020    Procedure: COLONOSCOPY WITH PROPOFOL;  Surgeon: Kerin Salen, MD;  Location: [facility];  Service: Gastroenterology;  Laterality: N/A;   EYE SURGERY       POLYPECTOMY   02/15/2020    Procedure: POLYPECTOMY;  Surgeon: Kerin Salen, MD;  Location: [facility];  Service: Gastroenterology;;             Family History  Problem Relation Age of Onset   Stroke Mother     Stroke Brother     Diabetes type II Brother          Social History:  reports that he quit smoking about 24 years ago. He has never used smokeless tobacco. He reports that he does not drink alcohol and does not use drugs. Allergies:  Allergies  No Known Allergies         Medications Prior to Admission   Medication Sig Dispense Refill   acetaminophen (TYLENOL) 500 MG tablet Take 1,000 mg by mouth every 6 (six) hours as needed for headache (pain).       amLODipine (NORVASC) 10 MG tablet Take 1 tablet (10 mg total) by mouth daily. 30 tablet 0   Brinzolamide-Brimonidine 1-0.2 % SUSP Place 1 drop into both eyes 2 (two) times daily.        Cholecalciferol (VITAMIN D-3) 125 MCG (5000 UT) TABS Take 5,000 Units by mouth daily.       clopidogrel (PLAVIX) 75 MG tablet Take 1 tablet (75 mg total) by mouth daily. 30 tablet 0   donepezil (ARICEPT) 10 MG tablet TAKE ONE TABLET BY MOUTH EVERYDAY AT BEDTIME 30 tablet 0   ezetimibe (ZETIA) 10 MG tablet Take 10 mg by mouth daily.       glipiZIDE (GLUCOTROL) 5 MG tablet Take 5 mg by mouth daily.       Inositol Niacinate 500 MG CAPS Take 500 mg by mouth every evening.       losartan (COZAAR) 100 MG tablet Take 100 mg by mouth daily.   1   memantine (NAMENDA) 10 MG tablet TAKE 1/2 TABLET BY MOUTH AT BEDTIME FOR 1 WEEK, THEN TAKE  1/2 TABLET BY MOUTH TWICE DAILY FOR 1 WEEK, THEN TAKE ONE TABLET BY MOUTH TWICE DAILY 60 tablet 0   metFORMIN (GLUCOPHAGE) 1000 MG tablet Take 500-1,000 mg by mouth See admin instructions. Take1000 mg in the morning and 500 mg in the evening   1   Omega-3 Fatty Acids (FISH OIL) 500 MG CAPS Take 500 mg by mouth every evening.       rosuvastatin (CRESTOR) 40 MG tablet Take 1 tablet (40 mg total) by mouth daily at 6 PM. 30 tablet 0   tolterodine (DETROL LA) 4 MG 24 hr capsule Take 4 mg by mouth daily.       Vibegron (GEMTESA) 75 MG TABS Take 75 mg by mouth daily.              Home: Home Living Family/patient expects to be discharged to:: Private residence Living Arrangements: Spouse/significant other Available Help at Discharge: Family, Available 24 hours/day Type of Home: House Home Access: Ramped entrance Home Layout: One level Bathroom Shower/Tub: Engineer, manufacturing systems: Standard Home Equipment: Rollator (4 wheels), Cane  - single point  Functional History: Prior Function Prior Level of Function : Needs assist Mobility Comments: Pt reports he ambulates with rollator without physical assistance, notes 4 falls in the day prior to admission. ADLs Comments: reports indep Functional Status:  Mobility: Bed Mobility Overal bed mobility: Needs Assistance Bed Mobility: Supine to Sit Supine to sit: Min assist, Mod assist, HOB elevated, Used rails General bed mobility comments: Pt is able to transfer BLE to EOB but requires min<>mod assist to upright trunk. Transfers Overall transfer level: Needs assistance Equipment used: Rolling walker (2 wheels) Transfers: Sit to/from Stand Sit to Stand: Min assist General transfer comment: STS from EOB & recliner, extra time to power up to standing Ambulation/Gait Ambulation/Gait assistance: Min assist Gait Distance (Feet): 6 Feet Assistive device: Rolling walker (2 wheels) Gait Pattern/deviations: Decreased step length - right, Decreased step length - left, Decreased dorsiflexion - left, Decreased stride length General Gait Details: Pt ambulates ~4 ft forwards with RW, 4 ft backwards with min assist 2/2 incontinent void & need to return to recliner. During short distance gait, pt requires cuing to ambulate within base of AD. Pt with decreased LLE hip/knee flexion during swing phase, decreased L foot clearance (pt dragging foot), with absent dorsiflexion & heel strike. Gait velocity: decreased   ADL: ADL Overall ADL's : Needs assistance/impaired Eating/Feeding: Modified independent, Sitting Eating/Feeding Details (indicate cue type and reason): drinking cup and straw Grooming: Wash/dry hands, Set up Upper Body Bathing: Set up, Sitting Upper Body Bathing Details (indicate cue type and reason): cues to initiate but otherwise demonstrates Lower Body Bathing: Minimal assistance, Set up, Sit to/from stand Lower Body Bathing Details (indicate cue type and reason): pt able to  complete majority of bathing in sitting position. pt with balance deficits iwth standing requires external support Upper Body Dressing : Set up Lower Body Dressing: Minimal assistance Lower Body Dressing Details (indicate cue type and reason): don socks. pt needed help due to long toe nails and strings in socks. pt able to thread it and decreased fine motor of L hand Toileting - Clothing Manipulation Details (indicate cue type and reason): on arrival pt incontinence of bladder and reports new. pt incontinence in the bed noted as well. OT taking over session due to need for adl with incontinence Functional mobility during ADLs: Rolling walker (2 wheels), Minimal assistance General ADL Comments: pt needs increased time and outside the RW  with turns   Cognition: Cognition Overall Cognitive Status: No family/caregiver present to determine baseline cognitive functioning Arousal/Alertness: Awake/alert Orientation Level: Oriented to person, Oriented to place, Oriented to time, Disoriented to situation Memory: Impaired Memory Impairment: Storage deficit, Retrieval deficit Executive Function: Sequencing Sequencing: Impaired Sequencing Impairment: Verbal basic Cognition Arousal: Alert Behavior During Therapy: WFL for tasks assessed/performed Overall Cognitive Status: No family/caregiver present to determine baseline cognitive functioning General Comments: Pt pleasant, follows simple commands throughout session, is incontinent of urine & reports this is new.   Blood pressure 119/69, pulse 66, temperature 98.3 F (36.8 C), temperature source Oral, resp. rate 18, height 5\' 6"  (1.676 m), weight 74.8 kg, SpO2 100%. Physical Exam Gen: no distress, normal appearing HEENT: oral mucosa pink and moist, NCAT Cardio: Reg rate Chest: normal effort, normal rate of breathing Abd: soft, non-distended Ext: no edema Psych: pleasant, normal affect Skin: intact Neuro: alert and oriented x3 Musculoskeletal: 4/5  strength throughout left side, ataxia on left side. Sensation is intact   Lab Results Last 24 Hours       Results for orders placed or performed during the hospital encounter of 10/29/22 (from the past 24 hour(s))  Glucose, capillary     Status: Abnormal    Collection Time: 10/30/22 12:07 PM  Result Value Ref Range    Glucose-Capillary 232 (H) 70 - 99 mg/dL    Comment 1 Notify RN      Comment 2 Document in Chart    Glucose, capillary     Status: None    Collection Time: 10/30/22  4:47 PM  Result Value Ref Range    Glucose-Capillary 99 70 - 99 mg/dL    Comment 1 Notify RN      Comment 2 Document in Chart    Glucose, capillary     Status: Abnormal    Collection Time: 10/30/22  9:04 PM  Result Value Ref Range    Glucose-Capillary 166 (H) 70 - 99 mg/dL  Glucose, capillary     Status: Abnormal    Collection Time: 10/31/22  6:47 AM  Result Value Ref Range    Glucose-Capillary 128 (H) 70 - 99 mg/dL       Imaging Results (Last 48 hours)  ECHOCARDIOGRAM COMPLETE   Result Date: 10/31/2022    ECHOCARDIOGRAM REPORT   Patient Name:   James Henderson Date of Exam: 10/31/2022 Medical Rec #:  [id number]      Height:       66.0 in Accession #:    [id number]     Weight:       164.9 lb Date of Birth:  06-Dec-1950       BSA:          1.842 m Patient Age:    72 years       BP:           121/63 mmHg Patient Gender: M              HR:           66 bpm. Exam Location:  Inpatient Procedure: 2D Echo, Cardiac Doppler and Color Doppler Indications:    Stroke I63.9  History:        Patient has prior history of Echocardiogram examinations, most                 recent 03/11/2021. CKD 3 and Stroke; Risk Factors:Hypertension,                 Dyslipidemia, Diabetes and  Former Smoker.  Sonographer:    Dondra Prader RVT RCS Referring Phys: [id number] TIMOTHY S OPYD  Sonographer Comments: Suboptimal parasternal window. IMPRESSIONS  1. Left ventricular ejection fraction, by estimation, is 60 to 65%. The left ventricle has normal  function. The left ventricle has no regional wall motion abnormalities. There is mild concentric left ventricular hypertrophy. Left ventricular diastolic parameters are consistent with Grade I diastolic dysfunction (impaired relaxation).  2. Right ventricular systolic function is normal. The right ventricular size is normal. Tricuspid regurgitation signal is inadequate for assessing PA pressure.  3. The mitral valve is normal in structure. No evidence of mitral valve regurgitation.  4. Aortic valve regurgitation is not visualized. Aortic valve sclerosis/calcification is present, without any evidence of aortic stenosis.  5. The inferior vena cava is normal in size with greater than 50% respiratory variability, suggesting right atrial pressure of 3 mmHg. FINDINGS  Left Ventricle: Left ventricular ejection fraction, by estimation, is 60 to 65%. The left ventricle has normal function. The left ventricle has no regional wall motion abnormalities. The left ventricular internal cavity size was normal in size. There is  mild concentric left ventricular hypertrophy. Left ventricular diastolic parameters are consistent with Grade I diastolic dysfunction (impaired relaxation). Right Ventricle: The right ventricular size is normal. Right ventricular systolic function is normal. Tricuspid regurgitation signal is inadequate for assessing PA pressure. Left Atrium: Left atrial size was normal in size. Right Atrium: Right atrial size was normal in size. Pericardium: There is no evidence of pericardial effusion. Mitral Valve: The mitral valve is normal in structure. No evidence of mitral valve regurgitation. Tricuspid Valve: Tricuspid valve regurgitation is not demonstrated. Aortic Valve: Aortic valve regurgitation is not visualized. Aortic valve sclerosis/calcification is present, without any evidence of aortic stenosis. Aortic valve mean gradient measures 5.0 mmHg. Aortic valve peak gradient measures 8.9 mmHg. Aortic valve  area,  by VTI measures 2.05 cm. Pulmonic Valve: The pulmonic valve was not well visualized. Pulmonic valve regurgitation is not visualized. Aorta: The aortic root and ascending aorta are structurally normal, with no evidence of dilitation. Venous: The inferior vena cava is normal in size with greater than 50% respiratory variability, suggesting right atrial pressure of 3 mmHg. IAS/Shunts: No atrial level shunt detected by color flow Doppler.  LEFT VENTRICLE PLAX 2D LVIDd:         3.40 cm   Diastology LVIDs:         2.40 cm   LV e' medial:    7.18 cm/s LV PW:         1.10 cm   LV E/e' medial:  12.4 LV IVS:        1.10 cm   LV e' lateral:   5.77 cm/s LVOT diam:     1.90 cm   LV E/e' lateral: 15.4 LV SV:         61 LV SV Index:   33 LVOT Area:     2.84 cm  RIGHT VENTRICLE             IVC RV Basal diam:  4.10 cm     IVC diam: 1.40 cm RV S prime:     11.90 cm/s TAPSE (M-mode): 2.1 cm LEFT ATRIUM             Index        RIGHT ATRIUM           Index LA diam:        2.90 cm 1.57 cm/m  RA Area:     11.20 cm LA Vol (A2C):   31.7 ml 17.21 ml/m  RA Volume:   27.00 ml  14.66 ml/m LA Vol (A4C):   39.3 ml 21.33 ml/m LA Biplane Vol: 36.2 ml 19.65 ml/m  AORTIC VALVE                     PULMONIC VALVE AV Area (Vmax):    2.15 cm      PV Vmax:       0.90 m/s AV Area (Vmean):   2.09 cm      PV Peak grad:  3.3 mmHg AV Area (VTI):     2.05 cm AV Vmax:           149.00 cm/s AV Vmean:          102.000 cm/s AV VTI:            0.296 m AV Peak Grad:      8.9 mmHg AV Mean Grad:      5.0 mmHg LVOT Vmax:         113.00 cm/s LVOT Vmean:        75.300 cm/s LVOT VTI:          0.214 m LVOT/AV VTI ratio: 0.72  AORTA Ao Root diam: 3.00 cm Ao Asc diam:  3.40 cm MITRAL VALVE MV Area (PHT): 2.56 cm     SHUNTS MV Decel Time: 296 msec     Systemic VTI:  0.21 m MV E velocity: 88.70 cm/s   Systemic Diam: 1.90 cm MV A velocity: 139.00 cm/s MV E/A ratio:  0.64 [place] signed by Carolan Clines Signature Date/Time: 10/31/2022/9:06:34 AM     Final     CT ANGIO HEAD NECK W WO CM   Result Date: 10/30/2022 CLINICAL DATA:  72 year old male with neurologic deficit, frequent falls. Right midbrain infarct on MRI yesterday. History of severe left PCA stenosis. EXAM: CT ANGIOGRAPHY HEAD AND NECK WITH AND WITHOUT CONTRAST TECHNIQUE: Multidetector CT imaging of the head and neck was performed using the standard protocol during bolus administration of intravenous contrast. Multiplanar CT image reconstructions and MIPs were obtained to evaluate the vascular anatomy. Carotid stenosis measurements (when applicable) are obtained utilizing NASCET criteria, using the distal internal carotid diameter as the denominator. RADIATION DOSE REDUCTION: This exam was performed according to the departmental dose-optimization program which includes automated exposure control, adjustment of the mA and/or kV according to patient size and/or use of iterative reconstruction technique. CONTRAST:  75mL OMNIPAQUE IOHEXOL 350 MG/ML SOLN COMPARISON:  Brain MRI yesterday. Prior CTA head and neck 03/11/2021. FINDINGS: CTA NECK Skeleton: Mostly absent dentition. Widespread advanced cervical spine degeneration. No acute osseous abnormality identified. Upper chest: Stable, negative. Other neck: No acute finding. Aortic arch: Calcified aortic atherosclerosis.  3 vessel arch. Right carotid system: Negative brachiocephalic artery and right CCA origin. Tortuous proximal right CCA. Minimal plaque at the right carotid bifurcation. No stenosis to the skull base. Left carotid system: Similar tortuosity and minimal plaque. No left carotid stenosis to the skull base. Vertebral arteries: Calcified right subclavian artery origin without stenosis. Normal right vertebral artery origin. Patent right vertebral artery with tortuosity to the skull base. No significant plaque or stenosis. Normal proximal left subclavian artery and left vertebral artery origin. Codominant, patent left vertebral artery to the  skull base without significant plaque or stenosis. CTA HEAD Posterior circulation: Patent distal vertebral arteries and vertebrobasilar junction without plaque or stenosis. Patent PICA, AICA  origins. Patent basilar artery without stenosis. Patent SCA and PCA origins. Chronic PCA irregularity bilaterally, moderate to severe in the left distal P1 and P2 segments which demonstrate string sign stenosis (series 11, image 22) which is stable from the CTA last year. Distal left P2, and other left PCA branches remain patent and appear more normal. No high-grade right PCA stenosis. Anterior circulation: Both ICA siphons are patent. Distal left siphon calcified plaque is mild without stenosis. Right siphon cavernous and supraclinoid calcified plaque with mild right supraclinoid ICA stenosis. Patent carotid termini. Posterior communicating arteries are diminutive or absent. Patent MCA and ACA origins with dominant right and diminutive left A1 segments. Normal anterior communicating artery. Bilateral ACA branches are stable and within normal limits. Left MCA M1 segment and bifurcation are patent without stenosis. Right MCA M1 segment and bifurcation are patent without stenosis. Bilateral MCA branches are stable. And there is moderate to severe right M3 or M4 stenosis on series 13, image 13. Venous sinuses: Early contrast timing, not evaluated. Anatomic variants: Dominant right ACA A1. Review of the MIP images confirms the above findings IMPRESSION: 1. Negative for emergent large vessel occlusion. Positive for stable chronically severe stenoses of the: - Left PCA P1/P2 (string sign stenosis). - Right MCA posterior M3/M4 branch. 2. Mild ICA siphon calcified plaque with mild right supraclinoid ICA stenosis. Minimal extracranial atherosclerosis, no significant extracranial stenosis. 3.  Aortic Atherosclerosis (ICD10-I70.0). Electronically Signed   By: Odessa Fleming M.D.   On: 10/30/2022 08:16    MR BRAIN WO CONTRAST   Result Date:  10/29/2022 CLINICAL DATA:  Frequent falls.  Acute neurologic deficit. EXAM: MRI HEAD WITHOUT CONTRAST TECHNIQUE: Multiplanar, multiecho pulse sequences of the brain and surrounding structures were obtained without intravenous contrast. COMPARISON:  02/27/2022 FINDINGS: Brain: There is a small acute infarct within the right mid brain, within the periaqueductal gray matter. There are innumerable chronic microhemorrhages throughout the brain in a mixed central and peripheral distribution. No acute hemorrhage. There is confluent hyperintense T2-weighted signal within the white matter. Generalized volume loss. The midline structures are normal. Unchanged small meningioma, measuring approximately 9 mm, at the lateral inferior surface of the left tentorial leaflet. Vascular: Normal flow voids Skull and upper cervical spine: No acute finding Sinuses/Orbits: Paranasal sinuses are clear. Bilateral ocular lens replacements. Other: None IMPRESSION: 1. Small acute infarct within the right mid brain, within the periaqueductal gray matter. No hemorrhage or mass effect. 2. Innumerable chronic microhemorrhages throughout the brain in a mixed central and peripheral distribution, concerning for cerebral amyloid angiopathy. Electronically Signed   By: Deatra Robinson M.D.   On: 10/29/2022 23:03    DG Chest Port 1 View   Result Date: 10/29/2022 CLINICAL DATA:  Altered mental status EXAM: PORTABLE CHEST 1 VIEW COMPARISON:  None Available. FINDINGS: Lungs are well expanded, symmetric, and clear. No pneumothorax or pleural effusion. Cardiac size within normal limits. Pulmonary vascularity is normal. Osseous structures are age-appropriate. No acute bone abnormality. IMPRESSION: No active disease. Electronically Signed   By: Helyn Numbers M.D.   On: 10/29/2022 19:52    CT Head Wo Contrast   Result Date: 10/29/2022 CLINICAL DATA:  Fall EXAM: CT HEAD WITHOUT CONTRAST TECHNIQUE: Contiguous axial images were obtained from the base of the  skull through the vertex without intravenous contrast. RADIATION DOSE REDUCTION: This exam was performed according to the departmental dose-optimization program which includes automated exposure control, adjustment of the mA and/or kV according to patient size and/or use of iterative reconstruction technique.  COMPARISON:  03/11/2021 FINDINGS: Brain: There is no mass, hemorrhage or extra-axial collection. There is generalized atrophy without lobar predilection. Hypodensity of the white matter is most commonly associated with chronic microvascular disease. Vascular: Atherosclerotic calcification of the vertebral and internal carotid arteries at the skull base. No abnormal hyperdensity of the major intracranial arteries or dural venous sinuses. Skull: The visualized skull base, calvarium and extracranial soft tissues are normal. Sinuses/Orbits: No fluid levels or advanced mucosal thickening of the visualized paranasal sinuses. No mastoid or middle ear effusion. The orbits are normal. IMPRESSION: 1. No acute intracranial abnormality. 2. Generalized atrophy and findings of chronic microvascular disease. Electronically Signed   By: Deatra Robinson M.D.   On: 10/29/2022 19:39       Assessment/Plan: Diagnosis: Acute ischemic CVA Does the need for close, 24 hr/day medical supervision in concert with the patient's rehab needs make it unreasonable for this patient to be served in a less intensive setting? Yes Co-Morbidities requiring supervision/potential complications:  1) overweight: provide dietary education 2) HTN: monitor BP TID 3) HLD 4) type 2 DM: check CBGs AC/HS 5) tobacco abuse: provide counseling Due to bladder management, bowel management, safety, skin/wound care, disease management, medication administration, pain management, and patient education, does the patient require 24 hr/day rehab nursing? Yes Does the patient require coordinated care of a physician, rehab nurse, therapy disciplines of PT, OT,   to address physical and functional deficits in the context of the above medical diagnosis(es)? Yes Addressing deficits in the following areas: balance, endurance, locomotion, strength, transferring, bowel/bladder control, bathing, dressing, feeding, grooming, toileting, and psychosocial support Can the patient actively participate in an intensive therapy program of at least 3 hrs of therapy per day at least 5 days per week? Yes The potential for patient to make measurable gains while on inpatient rehab is excellent Anticipated functional outcomes upon discharge from inpatient rehab are supervision  with PT, supervision with OT, supervision with SLP. Estimated rehab length of stay to reach the above functional goals is: 10-14 days Anticipated discharge destination: Home Overall Rehab/Functional Prognosis: excellent   POST ACUTE RECOMMENDATIONS: This patient's condition is appropriate for continued rehabilitative care in the following setting: CIR Patient has agreed to participate in recommended program. Yes Note that insurance prior authorization may be required for reimbursement for recommended care.       I have personally performed a face to face diagnostic evaluation of this patient. Additionally, I have examined the patient's medical record including any pertinent labs and radiographic images. If the physician assistant has documented in this note, I have reviewed and edited or otherwise concur with the physician assistant's documentation.   Thanks,   Horton Chin, MD 10/31/2022          Routing History

## 2022-11-01 NOTE — Discharge Summary (Signed)
Physician Discharge Summary  James Henderson WUJ:[id number] DOB: 1950/12/04 DOA: 10/29/2022  PCP: [facility]  Admit date: 10/29/2022 Discharge date: 11/01/2022  Admitted From: Home  Discharge disposition: CIR  Recommendations for Outpatient Follow-Up:   Follow up with your primary care provider and neurology after discharge from rehabilitation.  Discharge Diagnosis:   Principal Problem:   Acute ischemic stroke (HCC) Active Problems:   DM (diabetes mellitus) type II controlled, neurological manifestation (HCC)   Benign essential HTN   Mixed Alzheimer's and vascular dementia (HCC)   CVA (cerebral vascular accident) [facility])   Discharge Condition: Improved.  Diet recommendation: Low sodium, heart healthy.  Carbohydrate-modified.    Wound care: None.  Code status: Full.   History of Present Illness:   James Henderson is a pleasant 72 y.o. male with medical history significant for type 2 diabetes mellitus, hypertension, hyperlipidemia, and history of CVA with residual aphasia and left-sided weakness presented to the ED with recurrent falls total of 4 in 24 hours with  increased left-sided weakness.  Normally patient is able to ambulate with a walker.  In the ED, patient had stable vitals.  EKG showed normal sinus rhythm.  CT scan of the head negative for acute abnormality.  MRI of the brain showed acute infarct in the right midbrain as well as innumerable chronic microhemorrhages throughout the brain.  Lab was notable for creatinine of 1.7.  Neurology was consulted from the ED and patient was admitted hospital for further evaluation and treatment.    Hospital Course:   Following conditions were addressed during hospitalization as listed below,  Acute ischemic CVA  History of previous stroke. Continue stroke protocol.  Lipid panel with LDL of 35.  Hemoglobin A1c of 7.2., CTA head & neck was negative for emergent large vessel occlusion.  Had left PCA  P1 P2 string sign stenosis.   2D echocardiogram with LVEF of 60-65 percent with LVH and grade 1 diastolic dysfunction.    Patient does have baseline weakness from previous stroke and uses walker at home. PT/OT seen the patient and recommend CIR placement at this time.  Patient is currently on aspirin and Brilinta as per neurology for 4 weeks followed by aspirin alone.  To follow-up with neurology as outpatient after discharge.   Type II DM  Hemoglobin A1c of 7.2 at this time was 6.4 in January 2023.  Received sliding scale insulin during hospitalization.  Continue diabetic diet.  Latest POC glucose of 128.  On glipizide and metformin at home which will be resumed on discharge.   Essential hypertension  Continue losartan and amlodipine from home  Mild AKI on CKD 3a - SCr is 1.70 on admission; baseline may be closer to 1.4.  Latest creatinine at 1.4 and at baseline..    Dementia  - Delirium precautions .     Disposition.  At this time, patient is stable for disposition to CIR.  Medical Consultants:   Neurology  Procedures:    None Subjective:   Today, patient was seen and examined at bedside.  Denies interval complaints.  Still with persistent weakness  Discharge Exam:   Vitals:   11/01/22 0726 11/01/22 1100  BP: 116/77 114/71  Pulse: 72 84  Resp: 16 17  Temp: 98.3 F (36.8 C) 98.4 F (36.9 C)  SpO2: 100% 100%   Vitals:   10/31/22 2018 11/01/22 0336 11/01/22 0726 11/01/22 1100  BP: 123/71 118/72 116/77 114/71  Pulse: 72 74 72 84  Resp:  17 16 17   Temp: 98.9 F (37.2 C) 98 F (36.7 C) 98.3 F (36.8 C) 98.4 F (36.9 C)  TempSrc: Oral  Oral Oral  SpO2: 98% 100% 100% 100%  Weight:      Height:       Body mass index is 26.62 kg/m.   General: Alert awake, not in obvious distress, dysarthric speech HENT: pupils equally reacting to light,  No scleral pallor or icterus noted. Oral mucosa is moist.  Chest:  Diminished breath sounds bilaterally. No crackles or wheezes.   CVS: S1 &S2 heard. No murmur.  Regular rate and rhythm. Abdomen: Soft, nontender, nondistended.  Bowel sounds are heard.   Extremities: No cyanosis, clubbing or edema.  Peripheral pulses are palpable. Psych: Alert, awake and Communicative.  Oriented. CNS: Dysarthric speech.  Left upper and lower extremity weakness. Skin: Warm and dry.  No rashes noted.  The results of significant diagnostics from this hospitalization (including imaging, microbiology, ancillary and laboratory) are listed below for reference.     Diagnostic Studies:   CT ANGIO HEAD NECK W WO CM  Result Date: 10/30/2022 CLINICAL DATA:  72 year old male with neurologic deficit, frequent falls. Right midbrain infarct on MRI yesterday. History of severe left PCA stenosis. EXAM: CT ANGIOGRAPHY HEAD AND NECK WITH AND WITHOUT CONTRAST TECHNIQUE: Multidetector CT imaging of the head and neck was performed using the standard protocol during bolus administration of intravenous contrast. Multiplanar CT image reconstructions and MIPs were obtained to evaluate the vascular anatomy. Carotid stenosis measurements (when applicable) are obtained utilizing NASCET criteria, using the distal internal carotid diameter as the denominator. RADIATION DOSE REDUCTION: This exam was performed according to the departmental dose-optimization program which includes automated exposure control, adjustment of the mA and/or kV according to patient size and/or use of iterative reconstruction technique. CONTRAST:  75mL OMNIPAQUE IOHEXOL 350 MG/ML SOLN COMPARISON:  Brain MRI yesterday. Prior CTA head and neck 03/11/2021. FINDINGS: CTA NECK Skeleton: Mostly absent dentition. Widespread advanced cervical spine degeneration. No acute osseous abnormality identified. Upper chest: Stable, negative. Other neck: No acute finding. Aortic arch: Calcified aortic atherosclerosis.  3 vessel arch. Right carotid system: Negative brachiocephalic artery and right CCA origin. Tortuous  proximal right CCA. Minimal plaque at the right carotid bifurcation. No stenosis to the skull base. Left carotid system: Similar tortuosity and minimal plaque. No left carotid stenosis to the skull base. Vertebral arteries: Calcified right subclavian artery origin without stenosis. Normal right vertebral artery origin. Patent right vertebral artery with tortuosity to the skull base. No significant plaque or stenosis. Normal proximal left subclavian artery and left vertebral artery origin. Codominant, patent left vertebral artery to the skull base without significant plaque or stenosis. CTA HEAD Posterior circulation: Patent distal vertebral arteries and vertebrobasilar junction without plaque or stenosis. Patent PICA, AICA origins. Patent basilar artery without stenosis. Patent SCA and PCA origins. Chronic PCA irregularity bilaterally, moderate to severe in the left distal P1 and P2 segments which demonstrate string sign stenosis (series 11, image 22) which is stable from the CTA last year. Distal left P2, and other left PCA branches remain patent and appear more normal. No high-grade right PCA stenosis. Anterior circulation: Both ICA siphons are patent. Distal left siphon calcified plaque is mild without stenosis. Right siphon cavernous and supraclinoid calcified plaque with mild right supraclinoid ICA stenosis. Patent carotid termini. Posterior communicating arteries are diminutive or absent. Patent MCA and ACA origins with dominant right and diminutive left A1 segments. Normal anterior communicating artery. Bilateral ACA branches  are stable and within normal limits. Left MCA M1 segment and bifurcation are patent without stenosis. Right MCA M1 segment and bifurcation are patent without stenosis. Bilateral MCA branches are stable. And there is moderate to severe right M3 or M4 stenosis on series 13, image 13. Venous sinuses: Early contrast timing, not evaluated. Anatomic variants: Dominant right ACA A1. Review of  the MIP images confirms the above findings IMPRESSION: 1. Negative for emergent large vessel occlusion. Positive for stable chronically severe stenoses of the: - Left PCA P1/P2 (string sign stenosis). - Right MCA posterior M3/M4 branch. 2. Mild ICA siphon calcified plaque with mild right supraclinoid ICA stenosis. Minimal extracranial atherosclerosis, no significant extracranial stenosis. 3.  Aortic Atherosclerosis (ICD10-I70.0). Electronically Signed   By: Odessa Fleming M.D.   On: 10/30/2022 08:16   MR BRAIN WO CONTRAST  Result Date: 10/29/2022 CLINICAL DATA:  Frequent falls.  Acute neurologic deficit. EXAM: MRI HEAD WITHOUT CONTRAST TECHNIQUE: Multiplanar, multiecho pulse sequences of the brain and surrounding structures were obtained without intravenous contrast. COMPARISON:  02/27/2022 FINDINGS: Brain: There is a small acute infarct within the right mid brain, within the periaqueductal gray matter. There are innumerable chronic microhemorrhages throughout the brain in a mixed central and peripheral distribution. No acute hemorrhage. There is confluent hyperintense T2-weighted signal within the white matter. Generalized volume loss. The midline structures are normal. Unchanged small meningioma, measuring approximately 9 mm, at the lateral inferior surface of the left tentorial leaflet. Vascular: Normal flow voids Skull and upper cervical spine: No acute finding Sinuses/Orbits: Paranasal sinuses are clear. Bilateral ocular lens replacements. Other: None IMPRESSION: 1. Small acute infarct within the right mid brain, within the periaqueductal gray matter. No hemorrhage or mass effect. 2. Innumerable chronic microhemorrhages throughout the brain in a mixed central and peripheral distribution, concerning for cerebral amyloid angiopathy. Electronically Signed   By: Deatra Robinson M.D.   On: 10/29/2022 23:03   DG Chest Port 1 View  Result Date: 10/29/2022 CLINICAL DATA:  Altered mental status EXAM: PORTABLE CHEST 1 VIEW  COMPARISON:  None Available. FINDINGS: Lungs are well expanded, symmetric, and clear. No pneumothorax or pleural effusion. Cardiac size within normal limits. Pulmonary vascularity is normal. Osseous structures are age-appropriate. No acute bone abnormality. IMPRESSION: No active disease. Electronically Signed   By: Helyn Numbers M.D.   On: 10/29/2022 19:52   CT Head Wo Contrast  Result Date: 10/29/2022 CLINICAL DATA:  Fall EXAM: CT HEAD WITHOUT CONTRAST TECHNIQUE: Contiguous axial images were obtained from the base of the skull through the vertex without intravenous contrast. RADIATION DOSE REDUCTION: This exam was performed according to the departmental dose-optimization program which includes automated exposure control, adjustment of the mA and/or kV according to patient size and/or use of iterative reconstruction technique. COMPARISON:  03/11/2021 FINDINGS: Brain: There is no mass, hemorrhage or extra-axial collection. There is generalized atrophy without lobar predilection. Hypodensity of the white matter is most commonly associated with chronic microvascular disease. Vascular: Atherosclerotic calcification of the vertebral and internal carotid arteries at the skull base. No abnormal hyperdensity of the major intracranial arteries or dural venous sinuses. Skull: The visualized skull base, calvarium and extracranial soft tissues are normal. Sinuses/Orbits: No fluid levels or advanced mucosal thickening of the visualized paranasal sinuses. No mastoid or middle ear effusion. The orbits are normal. IMPRESSION: 1. No acute intracranial abnormality. 2. Generalized atrophy and findings of chronic microvascular disease. Electronically Signed   By: Deatra Robinson M.D.   On: 10/29/2022 19:39  Labs:   Basic Metabolic Panel: Recent Labs  Lab 10/29/22 1647 10/30/22 0539 11/01/22 0645  NA 143 142 139  K 3.9 3.5 3.8  CL 111 107 107  CO2 22 24 23   GLUCOSE 108* 123* 126*  BUN 18 18 17   CREATININE 1.70*  1.51* 1.40*  CALCIUM 9.0 8.9 8.7*  MG 2.3  --  2.3   GFR Estimated Creatinine Clearance: 41.8 mL/min (A) (by C-G formula based on SCr of 1.4 mg/dL (H)). Liver Function Tests: Recent Labs  Lab 10/29/22 1647  AST 35  ALT 22  ALKPHOS 97  BILITOT 0.7  PROT 6.7  ALBUMIN 3.8   No results for input(s): "LIPASE", "AMYLASE" in the last 168 hours. No results for input(s): "AMMONIA" in the last 168 hours. Coagulation profile No results for input(s): "INR", "PROTIME" in the last 168 hours.  CBC: Recent Labs  Lab 10/29/22 1647 10/30/22 0539 11/01/22 0645  WBC 5.3 4.8 5.0  NEUTROABS 3.2  --   --   HGB 12.7* 11.0* 11.8*  HCT 40.8 35.2* 36.8*  MCV 94.7 92.9 91.8  PLT 226 210 226   Cardiac Enzymes: No results for input(s): "CKTOTAL", "CKMB", "CKMBINDEX", "TROPONINI" in the last 168 hours. BNP: Invalid input(s): "POCBNP" CBG: Recent Labs  Lab 10/31/22 1117 10/31/22 1744 10/31/22 2142 11/01/22 0619 11/01/22 1104  GLUCAP 164* 181* 122* 130* 321*   D-Dimer No results for input(s): "DDIMER" in the last 72 hours. Hgb A1c Recent Labs    10/30/22 0539  HGBA1C 7.2*   Lipid Profile Recent Labs    10/30/22 0539  CHOL 86  HDL 39*  LDLCALC 35  TRIG 58  CHOLHDL 2.2   Thyroid function studies No results for input(s): "TSH", "T4TOTAL", "T3FREE", "THYROIDAB" in the last 72 hours.  Invalid input(s): "FREET3" Anemia work up No results for input(s): "VITAMINB12", "FOLATE", "FERRITIN", "TIBC", "IRON", "RETICCTPCT" in the last 72 hours. Microbiology No results found for this or any previous visit (from the past 240 hour(s)).   Discharge Instructions:   Discharge Instructions     Diet - low sodium heart healthy   Complete by: As directed    Discharge instructions   Complete by: As directed    Follow-up with your primary care provider as outpatient.  Follow-up with neurology after discharge from rehab   Increase activity slowly   Complete by: As directed        Allergies as of 11/01/2022   No Known Allergies      Medication List     STOP taking these medications    clopidogrel 75 MG tablet Commonly known as: PLAVIX       TAKE these medications    acetaminophen 325 MG tablet Commonly known as: TYLENOL Take 2 tablets (650 mg total) by mouth every 4 (four) hours as needed for mild pain (or temp > 37.5 C (99.5 F)). What changed:  medication strength how much to take when to take this reasons to take this   amLODipine 10 MG tablet Commonly known as: NORVASC Take 1 tablet (10 mg total) by mouth daily. What changed: how much to take   aspirin EC 81 MG tablet Take 1 tablet (81 mg total) by mouth daily. Swallow whole. Start taking on: November 02, 2022   Brinzolamide-Brimonidine 1-0.2 % Susp Place 1 drop into both eyes at bedtime.   donepezil 10 MG tablet Commonly known as: ARICEPT TAKE ONE TABLET BY MOUTH EVERYDAY AT BEDTIME What changed: See the new instructions.   ezetimibe 10  MG tablet Commonly known as: ZETIA Take 10 mg by mouth daily.   Fish Oil 500 MG Caps Take 500 mg by mouth daily.   Gemtesa 75 MG Tabs Generic drug: Vibegron Take 75 mg by mouth daily.   glipiZIDE 5 MG tablet Commonly known as: GLUCOTROL Take 5 mg by mouth daily.   Inositol Niacinate 500 MG Caps Take 500 mg by mouth daily.   losartan 100 MG tablet Commonly known as: COZAAR Take 100 mg by mouth daily.   memantine 10 MG tablet Commonly known as: NAMENDA TAKE 1/2 TABLET BY MOUTH AT BEDTIME FOR 1 WEEK, THEN TAKE 1/2 TABLET BY MOUTH TWICE DAILY FOR 1 WEEK, THEN TAKE ONE TABLET BY MOUTH TWICE DAILY What changed:  how much to take how to take this when to take this additional instructions   metFORMIN 1000 MG tablet Commonly known as: GLUCOPHAGE Take 1,000 mg by mouth daily.   rosuvastatin 40 MG tablet Commonly known as: CRESTOR Take 1 tablet (40 mg total) by mouth daily at 6 PM. What changed: when to take this   senna-docusate  8.6-50 MG tablet Commonly known as: Senokot-S Take 1 tablet by mouth at bedtime as needed for mild constipation.   ticagrelor 90 MG Tabs tablet Commonly known as: BRILINTA Take 1 tablet (90 mg total) by mouth 2 (two) times daily for 26 days.   tolterodine 4 MG 24 hr capsule Commonly known as: DETROL LA Take 4 mg by mouth daily.   Vitamin D-3 125 MCG (5000 UT) Tabs Take 5,000 Units by mouth daily.        Follow-up [employer], [facility] Follow up.   Contact information: [address] [place] [address] [phone]                  Time coordinating discharge: 39 minutes  Signed:  Vitalia Stough  [facility] 11/01/2022, 1:55 PM

## 2022-11-01 NOTE — H&P (Signed)
Physical Medicine and Rehabilitation Admission H&P        Chief Complaint  Patient presents with   Functional deficits due to new stroke      HPI:  James Henderson is a 72 year old RH-male with history of T2DM, HTN, meningioma, dementia, prior brainstem stroke with residual left sided weakness and aphasia who was admitted on 10/29/22 with 4 day history of increased fall and increase in left sided weakness with tendency to drag his leg. MRI brain done revealing small acute infarct in right midbrain with innumerable chronic microhemorrhages throughout the brain, generalized volume loss and unchanged small meningioma.  2D echo showed EF 60-65% with nor wall abnormality, aortic sclerosis and mild concentric LVH. He was found to have aphasia, ataxia, dysconjugate gaze and dysarthria. Dr. Pearlean Brownie felt that stroke likely due to small vessel disease and recommended ASA/Brillinta X 4 weeks followed by ASA alone and discontinue home plavix. .     At baseline--[facility] and family provides supervision when not at [facility]. From [place] --has been in [place] for 40 years. Married- has three children (2 adopted). He was able to ambulate with walker PTA. Therapy has been working with patient who is limited by weakness and requires min assist overall. CIR recommended due to functional decline.    Pt reports has chronic dysarthria- "normal for him"- LBM yesterday and using urinal to void.  Denies pain.  Review of Systems  Constitutional:  Negative for chills and fever.  HENT:  Negative for hearing loss and tinnitus.   Eyes:  Negative for double vision.  Respiratory:  Negative for cough and hemoptysis.   Cardiovascular:  Negative for chest pain and palpitations.  Gastrointestinal:  Negative for constipation and heartburn.  Genitourinary:  Positive for urgency.  Musculoskeletal:  Negative for myalgias.  Neurological:  Positive for speech change and weakness. Negative for dizziness.   Psychiatric/Behavioral:  The patient is not nervous/anxious and does not have insomnia.   All other systems reviewed and are negative.           Past Medical History:  Diagnosis Date   Brainstem stroke (HCC) 2015   Cerebral meningioma (HCC)     Diabetes mellitus without complication (HCC)     Glaucoma     Hyperlipidemia     Hypertension                 Past Surgical History:  Procedure Laterality Date   BIOPSY   02/15/2020    Procedure: BIOPSY;  Surgeon: Kerin Salen, MD;  Location: [facility];  Service: Gastroenterology;;   COLONOSCOPY WITH PROPOFOL N/A 02/15/2020    Procedure: COLONOSCOPY WITH PROPOFOL;  Surgeon: Kerin Salen, MD;  Location: [facility];  Service: Gastroenterology;  Laterality: N/A;   EYE SURGERY       POLYPECTOMY   02/15/2020    Procedure: POLYPECTOMY;  Surgeon: Kerin Salen, MD;  Location: [facility];  Service: Gastroenterology;;               Family History  Problem Relation Age of Onset   Stroke Mother     Stroke Brother     Diabetes type II Brother            Social History: Married. Worked in maintenance. Per  reports that he quit smoking about 24 years ago. He has never used smokeless tobacco. He reports that he does not drink alcohol and does not use drugs.  Allergies:  Allergies  No Known Allergies             Medications Prior to Admission  Medication Sig Dispense Refill   acetaminophen (TYLENOL) 500 MG tablet Take 1,000 mg by mouth every 6 (six) hours as needed for headache (pain).       amLODipine (NORVASC) 10 MG tablet Take 1 tablet (10 mg total) by mouth daily. (Patient taking differently: Take 20 mg by mouth daily.) 30 tablet 0   Brinzolamide-Brimonidine 1-0.2 % SUSP Place 1 drop into both eyes at bedtime.       Cholecalciferol (VITAMIN D-3) 125 MCG (5000 UT) TABS Take 5,000 Units by mouth daily.       clopidogrel (PLAVIX) 75 MG tablet Take 1 tablet (75 mg total) by mouth daily. 30 tablet 0   donepezil (ARICEPT) 10 MG  tablet TAKE ONE TABLET BY MOUTH EVERYDAY AT BEDTIME (Patient taking differently: Take 10 mg by mouth at bedtime.) 30 tablet 0   ezetimibe (ZETIA) 10 MG tablet Take 10 mg by mouth daily.       glipiZIDE (GLUCOTROL) 5 MG tablet Take 5 mg by mouth daily.       Inositol Niacinate 500 MG CAPS Take 500 mg by mouth daily.       losartan (COZAAR) 100 MG tablet Take 100 mg by mouth daily.   1   memantine (NAMENDA) 10 MG tablet TAKE 1/2 TABLET BY MOUTH AT BEDTIME FOR 1 WEEK, THEN TAKE 1/2 TABLET BY MOUTH TWICE DAILY FOR 1 WEEK, THEN TAKE ONE TABLET BY MOUTH TWICE DAILY (Patient taking differently: Take 10 mg by mouth 2 (two) times daily.) 60 tablet 0   metFORMIN (GLUCOPHAGE) 1000 MG tablet Take 1,000 mg by mouth daily.   1   Omega-3 Fatty Acids (FISH OIL) 500 MG CAPS Take 500 mg by mouth daily.       rosuvastatin (CRESTOR) 40 MG tablet Take 1 tablet (40 mg total) by mouth daily at 6 PM. (Patient taking differently: Take 40 mg by mouth daily.) 30 tablet 0   tolterodine (DETROL LA) 4 MG 24 hr capsule Take 4 mg by mouth daily.       Vibegron (GEMTESA) 75 MG TABS Take 75 mg by mouth daily.                  Home: Home Living Family/patient expects to be discharged to:: Private residence Living Arrangements: Spouse/significant other Available Help at Discharge: Family, Available 24 hours/day Type of Home: House Home Access: Ramped entrance Home Layout: One level Bathroom Shower/Tub: Engineer, manufacturing systems: Standard Home Equipment: Rollator (4 wheels), Cane - single point   Functional History: Prior Function Prior Level of Function : Needs assist Mobility Comments: Pt reports he ambulates with rollator without physical assistance, notes 4 falls in the day prior to admission. ADLs Comments: reports indep   Functional Status:  Mobility: Bed Mobility Overal bed mobility: Needs Assistance Bed Mobility: Supine to Sit Supine to sit: Min assist, HOB elevated, Used rails General bed mobility  comments: Pt initiating transfer to EOB. Min assist for trunk support as pt scoots out to get feet on the floor. Transfers Overall transfer level: Needs assistance Equipment used: Rolling walker (2 wheels) Transfers: Sit to/from Stand Sit to Stand: Min assist General transfer comment: VC's for hand placement on seated surface for safety. Min assist for power up to full stand and to gain/maintain standing balance. Ambulation/Gait Ambulation/Gait assistance: Min assist Gait Distance (Feet): 10 Feet Assistive device:  Rolling walker (2 wheels) Gait Pattern/deviations: Decreased step length - right, Decreased step length - left, Decreased dorsiflexion - left, Decreased stride length General Gait Details: Pt ambulated around the bed to the chair. Pt with incontinence episode during gait training and session focus shifted to clean up and linen change. Pt demonstrated the ability to advance LLE well when cued and pt concentrating on movement. Gait velocity: decreased Gait velocity interpretation: <1.31 ft/sec, indicative of household ambulator   ADL: ADL Overall ADL's : Needs assistance/impaired Eating/Feeding: Modified independent, Sitting Eating/Feeding Details (indicate cue type and reason): drinking cup and straw Grooming: Wash/dry hands, Set up Upper Body Bathing: Set up, Sitting Upper Body Bathing Details (indicate cue type and reason): cues to initiate but otherwise demonstrates Lower Body Bathing: Minimal assistance, Set up, Sit to/from stand Lower Body Bathing Details (indicate cue type and reason): pt able to complete majority of bathing in sitting position. pt with balance deficits iwth standing requires external support Upper Body Dressing : Set up Lower Body Dressing: Minimal assistance Lower Body Dressing Details (indicate cue type and reason): don socks. pt needed help due to long toe nails and strings in socks. pt able to thread it and decreased fine motor of L hand Toileting -  Clothing Manipulation Details (indicate cue type and reason): on arrival pt incontinence of bladder and reports new. pt incontinence in the bed noted as well. OT taking over session due to need for adl with incontinence Functional mobility during ADLs: Rolling walker (2 wheels), Minimal assistance General ADL Comments: pt needs increased time and outside the RW with turns   Cognition: Cognition Overall Cognitive Status: No family/caregiver present to determine baseline cognitive functioning Arousal/Alertness: Awake/alert Orientation Level: Oriented X4 Memory: Impaired Memory Impairment: Storage deficit, Retrieval deficit Executive Function: Sequencing Sequencing: Impaired Sequencing Impairment: Verbal basic Cognition Arousal: Alert Behavior During Therapy: WFL for tasks assessed/performed Overall Cognitive Status: No family/caregiver present to determine baseline cognitive functioning General Comments: Pt pleasant, follows simple commands throughout session, is incontinent of urine & reports this is new.   Physical Exam: Blood pressure 114/71, pulse 84, temperature 98.4 F (36.9 C), temperature source Oral, resp. rate 17, height 5\' 6"  (1.676 m), weight 74.8 kg, SpO2 100%. Physical Exam Vitals and nursing note reviewed.  Constitutional:      General: He is not in acute distress.    Appearance: Normal appearance. He is normal weight.     Comments: Awake, alert, appropriate, hard to understand, sitting up in bed; finished lunch< NAD  HENT:     Head: Normocephalic and atraumatic.     Comments: L facial droop Tongue midline    Right Ear: External ear normal.     Left Ear: External ear normal.     Nose: Nose normal. No congestion.     Mouth/Throat:     Mouth: Mucous membranes are moist.     Pharynx: Oropharynx is clear. No oropharyngeal exudate.  Eyes:     General:        Right eye: No discharge.        Left eye: No discharge.     Extraocular Movements: Extraocular movements  intact.     Left eye: Abnormal extraocular motion present.  Cardiovascular:     Rate and Rhythm: Normal rate. Rhythm irregular.     Heart sounds: Normal heart sounds. No murmur heard.    No gallop.  Pulmonary:     Effort: Pulmonary effort is normal. No respiratory distress.     Breath sounds:  Normal breath sounds. No wheezing, rhonchi or rales.  Abdominal:     General: Bowel sounds are normal. There is no distension.     Palpations: Abdomen is soft.     Tenderness: There is no abdominal tenderness.  Musculoskeletal:     Cervical back: Neck supple. No tenderness.     Comments: RUE 5-/5 RLE 5-/5 LUE posturing 4+/5 in biceps, triceps, WE, grip and FA LLE HF 4/5; KE/DF and PF 5-/5  Neurological:     Mental Status: He is alert and oriented to person, place, and time.     Comments: Dysarthric and ataxic speech. Sitting at EOB with good balance. He was able to answer orientation questions and able to follow simple one step commands.  Very dysarthric- hard to understand No clonus; no hoffman's no increased tone B/L Intact to light touch in all  4 extremities  Psychiatric:     Comments: flat        Lab Results Last 48 Hours        Results for orders placed or performed during the hospital encounter of 10/29/22 (from the past 48 hour(s))  Glucose, capillary     Status: None    Collection Time: 10/30/22  4:47 PM  Result Value Ref Range    Glucose-Capillary 99 70 - 99 mg/dL      Comment: Glucose reference range applies only to samples taken after fasting for at least 8 hours.    Comment 1 Notify RN      Comment 2 Document in Chart    Glucose, capillary     Status: Abnormal    Collection Time: 10/30/22  9:04 PM  Result Value Ref Range    Glucose-Capillary 166 (H) 70 - 99 mg/dL      Comment: Glucose reference range applies only to samples taken after fasting for at least 8 hours.  Glucose, capillary     Status: Abnormal    Collection Time: 10/31/22  6:47 AM  Result Value Ref Range     Glucose-Capillary 128 (H) 70 - 99 mg/dL      Comment: Glucose reference range applies only to samples taken after fasting for at least 8 hours.  Glucose, capillary     Status: Abnormal    Collection Time: 10/31/22 11:17 AM  Result Value Ref Range    Glucose-Capillary 164 (H) 70 - 99 mg/dL      Comment: Glucose reference range applies only to samples taken after fasting for at least 8 hours.    Comment 1 Notify RN    Glucose, capillary     Status: Abnormal    Collection Time: 10/31/22  5:44 PM  Result Value Ref Range    Glucose-Capillary 181 (H) 70 - 99 mg/dL      Comment: Glucose reference range applies only to samples taken after fasting for at least 8 hours.    Comment 1 Notify RN    Glucose, capillary     Status: Abnormal    Collection Time: 10/31/22  9:42 PM  Result Value Ref Range    Glucose-Capillary 122 (H) 70 - 99 mg/dL      Comment: Glucose reference range applies only to samples taken after fasting for at least 8 hours.    Comment 1 Notify RN      Comment 2 Document in Chart    Glucose, capillary     Status: Abnormal    Collection Time: 11/01/22  6:19 AM  Result Value Ref Range    Glucose-Capillary  130 (H) 70 - 99 mg/dL      Comment: Glucose reference range applies only to samples taken after fasting for at least 8 hours.    Comment 1 Notify RN      Comment 2 Document in Chart    Basic metabolic panel     Status: Abnormal    Collection Time: 11/01/22  6:45 AM  Result Value Ref Range    Sodium 139 135 - 145 mmol/L    Potassium 3.8 3.5 - 5.1 mmol/L    Chloride 107 98 - 111 mmol/L    CO2 23 22 - 32 mmol/L    Glucose, Bld 126 (H) 70 - 99 mg/dL      Comment: Glucose reference range applies only to samples taken after fasting for at least 8 hours.    BUN 17 8 - 23 mg/dL    Creatinine, Ser 1.61 (H) 0.61 - 1.24 mg/dL    Calcium 8.7 (L) 8.9 - 10.3 mg/dL    GFR, Estimated 53 (L) >60 mL/min      Comment: (NOTE) Calculated using the CKD-EPI Creatinine Equation (2021)       Anion gap 9 5 - 15      Comment: Performed at [facility], [address], [place], [place] [address]  CBC     Status: Abnormal    Collection Time: 11/01/22  6:45 AM  Result Value Ref Range    WBC 5.0 4.0 - 10.5 K/uL    RBC 4.01 (L) 4.22 - 5.81 MIL/uL    Hemoglobin 11.8 (L) 13.0 - 17.0 g/dL    HCT 54.0 (L) 98.1 - 52.0 %    MCV 91.8 80.0 - 100.0 fL    MCH 29.4 26.0 - 34.0 pg    MCHC 32.1 30.0 - 36.0 g/dL    RDW 19.1 47.8 - 29.5 %    Platelets 226 150 - 400 K/uL    nRBC 0.0 0.0 - 0.2 %      Comment: Performed at [facility], [address], [place], [place] [address]  Magnesium     Status: None    Collection Time: 11/01/22  6:45 AM  Result Value Ref Range    Magnesium 2.3 1.7 - 2.4 mg/dL      Comment: Performed at [facility], [address] [place], [place] [address]  Glucose, capillary     Status: Abnormal    Collection Time: 11/01/22 11:04 AM  Result Value Ref Range    Glucose-Capillary 321 (H) 70 - 99 mg/dL      Comment: Glucose reference range applies only to samples taken after fasting for at least 8 hours.    Comment 1 Notify RN      Comment 2 Document in Chart         Imaging Results (Last 48 hours)  ECHOCARDIOGRAM COMPLETE   Result Date: 10/31/2022    ECHOCARDIOGRAM REPORT   Patient Name:   AVISH CONSIDINE Date of Exam: 10/31/2022 Medical Rec #:  [id number]      Height:       66.0 in Accession #:    [id number]     Weight:       164.9 lb Date of Birth:  1950/05/18       BSA:          1.842 m Patient Age:    72 years       BP:           121/63  mmHg Patient Gender: M              HR:           66 bpm. Exam Location:  Inpatient Procedure: 2D Echo, Cardiac Doppler and Color Doppler Indications:    Stroke I63.9  History:        Patient has prior history of Echocardiogram examinations, most                 recent 03/11/2021. CKD 3 and Stroke; Risk Factors:Hypertension,                 Dyslipidemia, Diabetes and Former Smoker.  Sonographer:    Dondra Prader RVT RCS  Referring Phys: [id number] TIMOTHY S OPYD  Sonographer Comments: Suboptimal parasternal window. IMPRESSIONS  1. Left ventricular ejection fraction, by estimation, is 60 to 65%. The left ventricle has normal function. The left ventricle has no regional wall motion abnormalities. There is mild concentric left ventricular hypertrophy. Left ventricular diastolic parameters are consistent with Grade I diastolic dysfunction (impaired relaxation).  2. Right ventricular systolic function is normal. The right ventricular size is normal. Tricuspid regurgitation signal is inadequate for assessing PA pressure.  3. The mitral valve is normal in structure. No evidence of mitral valve regurgitation.  4. Aortic valve regurgitation is not visualized. Aortic valve sclerosis/calcification is present, without any evidence of aortic stenosis.  5. The inferior vena cava is normal in size with greater than 50% respiratory variability, suggesting right atrial pressure of 3 mmHg. FINDINGS  Left Ventricle: Left ventricular ejection fraction, by estimation, is 60 to 65%. The left ventricle has normal function. The left ventricle has no regional wall motion abnormalities. The left ventricular internal cavity size was normal in size. There is  mild concentric left ventricular hypertrophy. Left ventricular diastolic parameters are consistent with Grade I diastolic dysfunction (impaired relaxation). Right Ventricle: The right ventricular size is normal. Right ventricular systolic function is normal. Tricuspid regurgitation signal is inadequate for assessing PA pressure. Left Atrium: Left atrial size was normal in size. Right Atrium: Right atrial size was normal in size. Pericardium: There is no evidence of pericardial effusion. Mitral Valve: The mitral valve is normal in structure. No evidence of mitral valve regurgitation. Tricuspid Valve: Tricuspid valve regurgitation is not demonstrated. Aortic Valve: Aortic valve regurgitation is not visualized.  Aortic valve sclerosis/calcification is present, without any evidence of aortic stenosis. Aortic valve mean gradient measures 5.0 mmHg. Aortic valve peak gradient measures 8.9 mmHg. Aortic valve  area, by VTI measures 2.05 cm. Pulmonic Valve: The pulmonic valve was not well visualized. Pulmonic valve regurgitation is not visualized. Aorta: The aortic root and ascending aorta are structurally normal, with no evidence of dilitation. Venous: The inferior vena cava is normal in size with greater than 50% respiratory variability, suggesting right atrial pressure of 3 mmHg. IAS/Shunts: No atrial level shunt detected by color flow Doppler.  LEFT VENTRICLE PLAX 2D LVIDd:         3.40 cm   Diastology LVIDs:         2.40 cm   LV e' medial:    7.18 cm/s LV PW:         1.10 cm   LV E/e' medial:  12.4 LV IVS:        1.10 cm   LV e' lateral:   5.77 cm/s LVOT diam:     1.90 cm   LV E/e' lateral: 15.4 LV SV:  61 LV SV Index:   33 LVOT Area:     2.84 cm  RIGHT VENTRICLE             IVC RV Basal diam:  4.10 cm     IVC diam: 1.40 cm RV S prime:     11.90 cm/s TAPSE (M-mode): 2.1 cm LEFT ATRIUM             Index        RIGHT ATRIUM           Index LA diam:        2.90 cm 1.57 cm/m   RA Area:     11.20 cm LA Vol (A2C):   31.7 ml 17.21 ml/m  RA Volume:   27.00 ml  14.66 ml/m LA Vol (A4C):   39.3 ml 21.33 ml/m LA Biplane Vol: 36.2 ml 19.65 ml/m  AORTIC VALVE                     PULMONIC VALVE AV Area (Vmax):    2.15 cm      PV Vmax:       0.90 m/s AV Area (Vmean):   2.09 cm      PV Peak grad:  3.3 mmHg AV Area (VTI):     2.05 cm AV Vmax:           149.00 cm/s AV Vmean:          102.000 cm/s AV VTI:            0.296 m AV Peak Grad:      8.9 mmHg AV Mean Grad:      5.0 mmHg LVOT Vmax:         113.00 cm/s LVOT Vmean:        75.300 cm/s LVOT VTI:          0.214 m LVOT/AV VTI ratio: 0.72  AORTA Ao Root diam: 3.00 cm Ao Asc diam:  3.40 cm MITRAL VALVE MV Area (PHT): 2.56 cm     SHUNTS MV Decel Time: 296 msec     Systemic VTI:   0.21 m MV E velocity: 88.70 cm/s   Systemic Diam: 1.90 cm MV A velocity: 139.00 cm/s MV E/A ratio:  0.64 Photographer signed by Carolan Clines Signature Date/Time: 10/31/2022/9:06:34 AM    Final            Blood pressure 114/71, pulse 84, temperature 98.4 F (36.9 C), temperature source Oral, resp. rate 17, height 5\' 6"  (1.676 m), weight 74.8 kg, SpO2 100%.   Medical Problem List and Plan: 1. 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  Antithrombotics: -DVT/anticoagulation:  Pharmaceutical: Lovenox             -antiplatelet therapy: ASA/Brilinta X 4 weeks followed by ASA alone.  3. Pain Management:  Tylenol prn.  4. Mood/Behavior/Sleep: LCSW to follow for evaluation and support.              -antipsychotic agents: N/A 5. Neuropsych/cognition: This patient is capable of making decisions on his own behalf. 6. Skin/Wound Care: Routine pressure relief measures 7. Fluids/Electrolytes/Nutrition: Monitor I/O. Check CMET in am.  8. HTN: Monitor BP TID. Continue  Amlodipine. Continue to hold Losartan.  9. T2DM: Hgb A1c- 7.2--on glipizide and metformin PTA             --will resume metformin and monitor for need to resume glipizide             --monitor BS as/hs and use SSI for elevated BS.  10. Acute on chronic renal failure: SCr 1.7 at admission-->baseline likely 1.3-1.4 range             --improved with IVF for hydration and now back to baseline.              --avoid nephrotoxic drugs. Recheck BUN/SCr in am 11. Dementia: Continue Namenda and Aricept. Delirium precautions.  12. Low HDL:  LDL-58 and at goal. On Crestor and Zetia. 13. Hyperactive bladder?: Continue Tiovaz and Mirabegron.              Jacquelynn Cree, PA-C 11/01/2022     I have personally performed a face to face diagnostic evaluation of this patient and formulated the key components of the plan.   Additionally, I have personally reviewed laboratory data, imaging studies, as well as relevant notes and concur with the physician assistant's documentation above.   The patient's status has not changed from the original H&P.  Any changes in documentation from the acute care chart have been noted above.

## 2022-11-01 NOTE — Progress Notes (Addendum)
Speech Language Pathology Treatment: Cognitive-Linquistic  Patient Details Name: James Henderson MRN: [id number] DOB: [birth year] Today's Date: 11/01/2022 Time: [id number] SLP Time Calculation (min) (ACUTE ONLY): 23 min  Assessment / Plan / Recommendation Clinical Impression  Pt seen for f/u speech/language, cognitive tx with focus on dysarthria rehab with slow, precise, increased vocal intensity and pausing between phrases education provided to pt with teach back and mod verbal cues for reiteration of goals/strategies needed during implementation.  Pt Ox4 this date as yesterday he was not oriented to situation.  Pt required min cues to attend to tasks and overall intelligibility of speech reduced within phrases-conversation judged to be 50-75% accurate without use of compensatory strategies listed above.  Pt denies any change in attention/memory, but demonstrated word recall deficits within conversation.  Confrontational naming tasks appeared [facility], but complex naming decreased without use of mod verbal/visual cues provided by SLP.  Pt able to complete simple problem solving tasks, but complex tasks may be impaired/self-awareness of deficits decreased.  Recommend f/u in inpatient rehab for dysarthria tx/ongoing cognitive assessment/aphasia tx.    HPI HPI: 72 yo male moderate aphasia dysarthria dysconjugate gaze and L side weakness. Imaging reveal R midbrain stroke PMH CVA L parietal infarct, CVA brainstem,DM2, 11 mm meningioma, dementia,glaucoma HTN HLD CKDIII;ST completed a speech/language evaluation on 9/11 with recs for speech/lang tx.  ST f/u for dysarthria/language tx.      SLP Plan  Continue with current plan of care      Recommendations for follow up therapy are one component of a multi-disciplinary discharge planning process, led by the attending physician.  Recommendations may be updated based on patient status, additional functional criteria and insurance authorization.    Recommendations    CIR (inpatient rehab)                      Frequent or constant Supervision/Assistance Dysarthria and anarthria (R47.1);Cognitive communication deficit (R41.841);Aphasia (R47.01)     Continue with current plan of care     Pat Kori Colin,M.S., CCC-SLP  11/01/2022, 1:26 PM

## 2022-11-01 NOTE — H&P (Signed)
Physical Medicine and Rehabilitation Admission H&P    Chief Complaint  Patient presents with   Functional deficits due to new stroke    HPI:  James Henderson is a 72 year old RH-male with history of T2DM, HTN, meningioma, dementia, prior brainstem stroke with residual left sided weakness and aphasia who was admitted on 10/29/22 with 4 day history of increased fall and increase in left sided weakness with tendency to drag his leg. MRI brain done revealing small acute infarct in right midbrain with innumerable chronic microhemorrhages throughout the brain, generalized volume loss and unchanged small meningioma.  2D echo showed EF 60-65% with nor wall abnormality, aortic sclerosis and mild concentric LVH. He was found to have aphasia, ataxia, dysconjugate gaze and dysarthria. Dr. Pearlean Brownie felt that stroke likely due to small vessel disease and recommended ASA/Brillinta X 4 weeks followed by ASA alone and discontinue home plavix. .    At baseline--[facility] and family provides supervision when not at [facility]. From [place] --has been in [place] for 40 years. Married- has three children (2 adopted). He was able to ambulate with walker PTA. Therapy has been working with patient who is limited by weakness and requires min assist overall. CIR recommended due to functional decline.   Pt reports has chronic dysarthria- "normal for him"- LBM yesterday and using urinal to void.  Denies pain.  Review of Systems  Constitutional:  Negative for chills and fever.  HENT:  Negative for hearing loss and tinnitus.   Eyes:  Negative for double vision.  Respiratory:  Negative for cough and hemoptysis.   Cardiovascular:  Negative for chest pain and palpitations.  Gastrointestinal:  Negative for constipation and heartburn.  Genitourinary:  Positive for urgency.  Musculoskeletal:  Negative for myalgias.  Neurological:  Positive for speech change and weakness. Negative for dizziness.   Psychiatric/Behavioral:  The patient is not nervous/anxious and does not have insomnia.   All other systems reviewed and are negative.    Past Medical History:  Diagnosis Date   Brainstem stroke (HCC) 2015   Cerebral meningioma (HCC)    Diabetes mellitus without complication (HCC)    Glaucoma    Hyperlipidemia    Hypertension     Past Surgical History:  Procedure Laterality Date   BIOPSY  02/15/2020   Procedure: BIOPSY;  Surgeon: Kerin Salen, MD;  Location: [facility];  Service: Gastroenterology;;   COLONOSCOPY WITH PROPOFOL N/A 02/15/2020   Procedure: COLONOSCOPY WITH PROPOFOL;  Surgeon: Kerin Salen, MD;  Location: [facility];  Service: Gastroenterology;  Laterality: N/A;   EYE SURGERY     POLYPECTOMY  02/15/2020   Procedure: POLYPECTOMY;  Surgeon: Kerin Salen, MD;  Location: [facility];  Service: Gastroenterology;;    Family History  Problem Relation Age of Onset   Stroke Mother    Stroke Brother    Diabetes type II Brother     Social History: Married. Worked in maintenance. Per  reports that he quit smoking about 24 years ago. He has never used smokeless tobacco. He reports that he does not drink alcohol and does not use drugs.   Allergies: No Known Allergies   Medications Prior to Admission  Medication Sig Dispense Refill   acetaminophen (TYLENOL) 500 MG tablet Take 1,000 mg by mouth every 6 (six) hours as needed for headache (pain).     amLODipine (NORVASC) 10 MG tablet Take 1 tablet (10 mg total) by mouth daily. (Patient taking differently: Take 20 mg by  mouth daily.) 30 tablet 0   Brinzolamide-Brimonidine 1-0.2 % SUSP Place 1 drop into both eyes at bedtime.     Cholecalciferol (VITAMIN D-3) 125 MCG (5000 UT) TABS Take 5,000 Units by mouth daily.     clopidogrel (PLAVIX) 75 MG tablet Take 1 tablet (75 mg total) by mouth daily. 30 tablet 0   donepezil (ARICEPT) 10 MG tablet TAKE ONE TABLET BY MOUTH EVERYDAY AT BEDTIME (Patient taking differently: Take 10 mg  by mouth at bedtime.) 30 tablet 0   ezetimibe (ZETIA) 10 MG tablet Take 10 mg by mouth daily.     glipiZIDE (GLUCOTROL) 5 MG tablet Take 5 mg by mouth daily.     Inositol Niacinate 500 MG CAPS Take 500 mg by mouth daily.     losartan (COZAAR) 100 MG tablet Take 100 mg by mouth daily.  1   memantine (NAMENDA) 10 MG tablet TAKE 1/2 TABLET BY MOUTH AT BEDTIME FOR 1 WEEK, THEN TAKE 1/2 TABLET BY MOUTH TWICE DAILY FOR 1 WEEK, THEN TAKE ONE TABLET BY MOUTH TWICE DAILY (Patient taking differently: Take 10 mg by mouth 2 (two) times daily.) 60 tablet 0   metFORMIN (GLUCOPHAGE) 1000 MG tablet Take 1,000 mg by mouth daily.  1   Omega-3 Fatty Acids (FISH OIL) 500 MG CAPS Take 500 mg by mouth daily.     rosuvastatin (CRESTOR) 40 MG tablet Take 1 tablet (40 mg total) by mouth daily at 6 PM. (Patient taking differently: Take 40 mg by mouth daily.) 30 tablet 0   tolterodine (DETROL LA) 4 MG 24 hr capsule Take 4 mg by mouth daily.     Vibegron (GEMTESA) 75 MG TABS Take 75 mg by mouth daily.        Home: Home Living Family/patient expects to be discharged to:: Private residence Living Arrangements: Spouse/significant other Available Help at Discharge: Family, Available 24 hours/day Type of Home: House Home Access: Ramped entrance Home Layout: One level Bathroom Shower/Tub: Engineer, manufacturing systems: Standard Home Equipment: Rollator (4 wheels), Cane - single point   Functional History: Prior Function Prior Level of Function : Needs assist Mobility Comments: Pt reports he ambulates with rollator without physical assistance, notes 4 falls in the day prior to admission. ADLs Comments: reports indep  Functional Status:  Mobility: Bed Mobility Overal bed mobility: Needs Assistance Bed Mobility: Supine to Sit Supine to sit: Min assist, HOB elevated, Used rails General bed mobility comments: Pt initiating transfer to EOB. Min assist for trunk support as pt scoots out to get feet on the  floor. Transfers Overall transfer level: Needs assistance Equipment used: Rolling walker (2 wheels) Transfers: Sit to/from Stand Sit to Stand: Min assist General transfer comment: VC's for hand placement on seated surface for safety. Min assist for power up to full stand and to gain/maintain standing balance. Ambulation/Gait Ambulation/Gait assistance: Min assist Gait Distance (Feet): 10 Feet Assistive device: Rolling walker (2 wheels) Gait Pattern/deviations: Decreased step length - right, Decreased step length - left, Decreased dorsiflexion - left, Decreased stride length General Gait Details: Pt ambulated around the bed to the chair. Pt with incontinence episode during gait training and session focus shifted to clean up and linen change. Pt demonstrated the ability to advance LLE well when cued and pt concentrating on movement. Gait velocity: decreased Gait velocity interpretation: <1.31 ft/sec, indicative of household ambulator    ADL: ADL Overall ADL's : Needs assistance/impaired Eating/Feeding: Modified independent, Sitting Eating/Feeding Details (indicate cue type and reason): drinking cup and straw  Grooming: Wash/dry hands, Set up Upper Body Bathing: Set up, Sitting Upper Body Bathing Details (indicate cue type and reason): cues to initiate but otherwise demonstrates Lower Body Bathing: Minimal assistance, Set up, Sit to/from stand Lower Body Bathing Details (indicate cue type and reason): pt able to complete majority of bathing in sitting position. pt with balance deficits iwth standing requires external support Upper Body Dressing : Set up Lower Body Dressing: Minimal assistance Lower Body Dressing Details (indicate cue type and reason): don socks. pt needed help due to long toe nails and strings in socks. pt able to thread it and decreased fine motor of L hand Toileting - Clothing Manipulation Details (indicate cue type and reason): on arrival pt incontinence of bladder and  reports new. pt incontinence in the bed noted as well. OT taking over session due to need for adl with incontinence Functional mobility during ADLs: Rolling walker (2 wheels), Minimal assistance General ADL Comments: pt needs increased time and outside the RW with turns  Cognition: Cognition Overall Cognitive Status: No family/caregiver present to determine baseline cognitive functioning Arousal/Alertness: Awake/alert Orientation Level: Oriented X4 Memory: Impaired Memory Impairment: Storage deficit, Retrieval deficit Executive Function: Sequencing Sequencing: Impaired Sequencing Impairment: Verbal basic Cognition Arousal: Alert Behavior During Therapy: WFL for tasks assessed/performed Overall Cognitive Status: No family/caregiver present to determine baseline cognitive functioning General Comments: Pt pleasant, follows simple commands throughout session, is incontinent of urine & reports this is new.  Physical Exam: Blood pressure 114/71, pulse 84, temperature 98.4 F (36.9 C), temperature source Oral, resp. rate 17, height 5\' 6"  (1.676 m), weight 74.8 kg, SpO2 100%. Physical Exam Vitals and nursing note reviewed.  Constitutional:      General: He is not in acute distress.    Appearance: Normal appearance. He is normal weight.     Comments: Awake, alert, appropriate, hard to understand, sitting up in bed; finished lunch< NAD  HENT:     Head: Normocephalic and atraumatic.     Comments: L facial droop Tongue midline    Right Ear: External ear normal.     Left Ear: External ear normal.     Nose: Nose normal. No congestion.     Mouth/Throat:     Mouth: Mucous membranes are moist.     Pharynx: Oropharynx is clear. No oropharyngeal exudate.  Eyes:     General:        Right eye: No discharge.        Left eye: No discharge.     Extraocular Movements: Extraocular movements intact.     Left eye: Abnormal extraocular motion present.  Cardiovascular:     Rate and Rhythm: Normal  rate. Rhythm irregular.     Heart sounds: Normal heart sounds. No murmur heard.    No gallop.  Pulmonary:     Effort: Pulmonary effort is normal. No respiratory distress.     Breath sounds: Normal breath sounds. No wheezing, rhonchi or rales.  Abdominal:     General: Bowel sounds are normal. There is no distension.     Palpations: Abdomen is soft.     Tenderness: There is no abdominal tenderness.  Musculoskeletal:     Cervical back: Neck supple. No tenderness.     Comments: RUE 5-/5 RLE 5-/5 LUE posturing 4+/5 in biceps, triceps, WE, grip and FA LLE HF 4/5; KE/DF and PF 5-/5  Neurological:     Mental Status: He is alert and oriented to person, place, and time.     Comments: Dysarthric and  ataxic speech. Sitting at EOB with good balance. He was able to answer orientation questions and able to follow simple one step commands.  Very dysarthric- hard to understand No clonus; no hoffman's no increased tone B/L Intact to light touch in all  4 extremities  Psychiatric:     Comments: flat     Results for orders placed or performed during the hospital encounter of 10/29/22 (from the past 48 hour(s))  Glucose, capillary     Status: None   Collection Time: 10/30/22  4:47 PM  Result Value Ref Range   Glucose-Capillary 99 70 - 99 mg/dL    Comment: Glucose reference range applies only to samples taken after fasting for at least 8 hours.   Comment 1 Notify RN    Comment 2 Document in Chart   Glucose, capillary     Status: Abnormal   Collection Time: 10/30/22  9:04 PM  Result Value Ref Range   Glucose-Capillary 166 (H) 70 - 99 mg/dL    Comment: Glucose reference range applies only to samples taken after fasting for at least 8 hours.  Glucose, capillary     Status: Abnormal   Collection Time: 10/31/22  6:47 AM  Result Value Ref Range   Glucose-Capillary 128 (H) 70 - 99 mg/dL    Comment: Glucose reference range applies only to samples taken after fasting for at least 8 hours.  Glucose,  capillary     Status: Abnormal   Collection Time: 10/31/22 11:17 AM  Result Value Ref Range   Glucose-Capillary 164 (H) 70 - 99 mg/dL    Comment: Glucose reference range applies only to samples taken after fasting for at least 8 hours.   Comment 1 Notify RN   Glucose, capillary     Status: Abnormal   Collection Time: 10/31/22  5:44 PM  Result Value Ref Range   Glucose-Capillary 181 (H) 70 - 99 mg/dL    Comment: Glucose reference range applies only to samples taken after fasting for at least 8 hours.   Comment 1 Notify RN   Glucose, capillary     Status: Abnormal   Collection Time: 10/31/22  9:42 PM  Result Value Ref Range   Glucose-Capillary 122 (H) 70 - 99 mg/dL    Comment: Glucose reference range applies only to samples taken after fasting for at least 8 hours.   Comment 1 Notify RN    Comment 2 Document in Chart   Glucose, capillary     Status: Abnormal   Collection Time: 11/01/22  6:19 AM  Result Value Ref Range   Glucose-Capillary 130 (H) 70 - 99 mg/dL    Comment: Glucose reference range applies only to samples taken after fasting for at least 8 hours.   Comment 1 Notify RN    Comment 2 Document in Chart   Basic metabolic panel     Status: Abnormal   Collection Time: 11/01/22  6:45 AM  Result Value Ref Range   Sodium 139 135 - 145 mmol/L   Potassium 3.8 3.5 - 5.1 mmol/L   Chloride 107 98 - 111 mmol/L   CO2 23 22 - 32 mmol/L   Glucose, Bld 126 (H) 70 - 99 mg/dL    Comment: Glucose reference range applies only to samples taken after fasting for at least 8 hours.   BUN 17 8 - 23 mg/dL   Creatinine, Ser 1.61 (H) 0.61 - 1.24 mg/dL   Calcium 8.7 (L) 8.9 - 10.3 mg/dL   GFR, Estimated 53 (L) >  60 mL/min    Comment: (NOTE) Calculated using the CKD-EPI Creatinine Equation (2021)    Anion gap 9 5 - 15    Comment: Performed at [facility], [address], [place], [place] [address]  CBC     Status: Abnormal   Collection Time: 11/01/22  6:45 AM  Result Value Ref Range    WBC 5.0 4.0 - 10.5 K/uL   RBC 4.01 (L) 4.22 - 5.81 MIL/uL   Hemoglobin 11.8 (L) 13.0 - 17.0 g/dL   HCT 60.4 (L) 54.0 - 98.1 %   MCV 91.8 80.0 - 100.0 fL   MCH 29.4 26.0 - 34.0 pg   MCHC 32.1 30.0 - 36.0 g/dL   RDW 19.1 47.8 - 29.5 %   Platelets 226 150 - 400 K/uL   nRBC 0.0 0.0 - 0.2 %    Comment: Performed at [facility], [address] [place], [place] [address]  Magnesium     Status: None   Collection Time: 11/01/22  6:45 AM  Result Value Ref Range   Magnesium 2.3 1.7 - 2.4 mg/dL    Comment: Performed at [facility], [address], [place], [place] [address]  Glucose, capillary     Status: Abnormal   Collection Time: 11/01/22 11:04 AM  Result Value Ref Range   Glucose-Capillary 321 (H) 70 - 99 mg/dL    Comment: Glucose reference range applies only to samples taken after fasting for at least 8 hours.   Comment 1 Notify RN    Comment 2 Document in Chart    ECHOCARDIOGRAM COMPLETE  Result Date: 10/31/2022    ECHOCARDIOGRAM REPORT   Patient Name:   KAHRI ARCHIE Date of Exam: 10/31/2022 Medical Rec #:  [id number]      Height:       66.0 in Accession #:    [id number]     Weight:       164.9 lb Date of Birth:  1950-05-19       BSA:          1.842 m Patient Age:    72 years       BP:           121/63 mmHg Patient Gender: M              HR:           66 bpm. Exam Location:  Inpatient Procedure: 2D Echo, Cardiac Doppler and Color Doppler Indications:    Stroke I63.9  History:        Patient has prior history of Echocardiogram examinations, most                 recent 03/11/2021. CKD 3 and Stroke; Risk Factors:Hypertension,                 Dyslipidemia, Diabetes and Former Smoker.  Sonographer:    Dondra Prader RVT RCS Referring Phys: [id number] TIMOTHY S OPYD  Sonographer Comments: Suboptimal parasternal window. IMPRESSIONS  1. Left ventricular ejection fraction, by estimation, is 60 to 65%. The left ventricle has normal function. The left ventricle has no regional wall motion  abnormalities. There is mild concentric left ventricular hypertrophy. Left ventricular diastolic parameters are consistent with Grade I diastolic dysfunction (impaired relaxation).  2. Right ventricular systolic function is normal. The right ventricular size is normal. Tricuspid regurgitation signal is inadequate for assessing PA pressure.  3. The mitral valve is normal in structure. No evidence of mitral valve  regurgitation.  4. Aortic valve regurgitation is not visualized. Aortic valve sclerosis/calcification is present, without any evidence of aortic stenosis.  5. The inferior vena cava is normal in size with greater than 50% respiratory variability, suggesting right atrial pressure of 3 mmHg. FINDINGS  Left Ventricle: Left ventricular ejection fraction, by estimation, is 60 to 65%. The left ventricle has normal function. The left ventricle has no regional wall motion abnormalities. The left ventricular internal cavity size was normal in size. There is  mild concentric left ventricular hypertrophy. Left ventricular diastolic parameters are consistent with Grade I diastolic dysfunction (impaired relaxation). Right Ventricle: The right ventricular size is normal. Right ventricular systolic function is normal. Tricuspid regurgitation signal is inadequate for assessing PA pressure. Left Atrium: Left atrial size was normal in size. Right Atrium: Right atrial size was normal in size. Pericardium: There is no evidence of pericardial effusion. Mitral Valve: The mitral valve is normal in structure. No evidence of mitral valve regurgitation. Tricuspid Valve: Tricuspid valve regurgitation is not demonstrated. Aortic Valve: Aortic valve regurgitation is not visualized. Aortic valve sclerosis/calcification is present, without any evidence of aortic stenosis. Aortic valve mean gradient measures 5.0 mmHg. Aortic valve peak gradient measures 8.9 mmHg. Aortic valve  area, by VTI measures 2.05 cm. Pulmonic Valve: The pulmonic  valve was not well visualized. Pulmonic valve regurgitation is not visualized. Aorta: The aortic root and ascending aorta are structurally normal, with no evidence of dilitation. Venous: The inferior vena cava is normal in size with greater than 50% respiratory variability, suggesting right atrial pressure of 3 mmHg. IAS/Shunts: No atrial level shunt detected by color flow Doppler.  LEFT VENTRICLE PLAX 2D LVIDd:         3.40 cm   Diastology LVIDs:         2.40 cm   LV e' medial:    7.18 cm/s LV PW:         1.10 cm   LV E/e' medial:  12.4 LV IVS:        1.10 cm   LV e' lateral:   5.77 cm/s LVOT diam:     1.90 cm   LV E/e' lateral: 15.4 LV SV:         61 LV SV Index:   33 LVOT Area:     2.84 cm  RIGHT VENTRICLE             IVC RV Basal diam:  4.10 cm     IVC diam: 1.40 cm RV S prime:     11.90 cm/s TAPSE (M-mode): 2.1 cm LEFT ATRIUM             Index        RIGHT ATRIUM           Index LA diam:        2.90 cm 1.57 cm/m   RA Area:     11.20 cm LA Vol (A2C):   31.7 ml 17.21 ml/m  RA Volume:   27.00 ml  14.66 ml/m LA Vol (A4C):   39.3 ml 21.33 ml/m LA Biplane Vol: 36.2 ml 19.65 ml/m  AORTIC VALVE                     PULMONIC VALVE AV Area (Vmax):    2.15 cm      PV Vmax:       0.90 m/s AV Area (Vmean):   2.09 cm      PV Peak grad:  3.3 mmHg  AV Area (VTI):     2.05 cm AV Vmax:           149.00 cm/s AV Vmean:          102.000 cm/s AV VTI:            0.296 m AV Peak Grad:      8.9 mmHg AV Mean Grad:      5.0 mmHg LVOT Vmax:         113.00 cm/s LVOT Vmean:        75.300 cm/s LVOT VTI:          0.214 m LVOT/AV VTI ratio: 0.72  AORTA Ao Root diam: 3.00 cm Ao Asc diam:  3.40 cm MITRAL VALVE MV Area (PHT): 2.56 cm     SHUNTS MV Decel Time: 296 msec     Systemic VTI:  0.21 m MV E velocity: 88.70 cm/s   Systemic Diam: 1.90 cm MV A velocity: 139.00 cm/s MV E/A ratio:  0.64 [place] signed by Carolan Clines Signature Date/Time: 10/31/2022/9:06:34 AM    Final       Blood pressure 114/71, pulse 84,  temperature 98.4 F (36.9 C), temperature source Oral, resp. rate 17, height 5\' 6"  (1.676 m), weight 74.8 kg, SpO2 100%.  Medical Problem List and Plan: 1. Functional deficits secondary to R midbrain stroke  -patient may  shower  -ELOS/Goals: 7-10 days supervision  Admit to CIR 2.  Antithrombotics: -DVT/anticoagulation:  Pharmaceutical: Lovenox  -antiplatelet therapy: ASA/Brilinta X 4 weeks followed by ASA alone.  3. Pain Management:  Tylenol prn.  4. Mood/Behavior/Sleep: LCSW to follow for evaluation and support.   -antipsychotic agents: N/A 5. Neuropsych/cognition: This patient is capable of making decisions on his own behalf. 6. Skin/Wound Care: Routine pressure relief measures 7. Fluids/Electrolytes/Nutrition: Monitor I/O. Check CMET in am.  8. HTN: Monitor BP TID. Continue Amlodipine. Continue to hold Losartan.  9. T2DM: Hgb A1c- 7.2--on glipizide and metformin PTA  --will resume metformin and monitor for need to resume glipizide  --monitor BS as/hs and use SSI for elevated BS.  10. Acute on chronic renal failure: SCr 1.7 at admission-->baseline likely 1.3-1.4 range  --improved with IVF for hydration and now back to baseline.   --avoid nephrotoxic drugs. Recheck BUN/SCr in am 11. Dementia: Continue Namenda and Aricept. Delirium precautions.  12. Low HDL:  LDL-58 and at goal. On Crestor and Zetia. 13. Hyperactive bladder?: Continue Tiovaz and Mirabegron.        Jacquelynn Cree, PA-C 11/01/2022   I have personally performed a face to face diagnostic evaluation of this patient and formulated the key components of the plan.  Additionally, I have personally reviewed laboratory data, imaging studies, as well as relevant notes and concur with the physician assistant's documentation above.   The patient's status has not changed from the original H&P.  Any changes in documentation from the acute care chart have been noted above.

## 2022-11-01 NOTE — TOC Transition Note (Signed)
Transition of Care [facility]) - CM/SW Discharge Note   Patient Details  Name: James Henderson MRN: [id number] Date of Birth: [birth year]  Transition of [facility]) CM/SW Contact:  Kermit Balo, RN Phone Number: 11/01/2022, 10:43 AM   Clinical Narrative:    Pt is discharging to CIR today. CM signing off.    Final next level of care: IP Rehab Facility Barriers to Discharge: No Barriers Identified   Patient Goals and CMS Choice CMS Medicare.gov Compare Post Acute Care list provided to:: Patient Represenative (must comment) Choice offered to / list presented to : Spouse, Adult Children, Patient  Discharge Placement                         Discharge Plan and Services Additional resources added to the After Visit Summary for     Discharge Planning Services: CM Consult                                 Social Determinants of Health (SDOH) Interventions SDOH Screenings   Food Insecurity: No Food Insecurity (10/30/2022)  Housing: Low Risk  (10/30/2022)  Transportation Needs: No Transportation Needs (10/30/2022)  Utilities: Not At Risk (10/30/2022)  Tobacco Use: Medium Risk (10/29/2022)     Readmission Risk Interventions     No data to display

## 2022-11-01 NOTE — Progress Notes (Signed)
PMR Admission Coordinator Pre-Admission Assessment   Patient: James Henderson is an 72 y.o., male MRN: [id number] DOB: Dec 09, 1950 Height: 5\' 6"  (167.6 cm) Weight: 74.8 kg   Insurance Information HMO:     PPO:      PCP:      IPA:      80/20:      OTHER:  PRIMARY: [facility] [facility]      Policy#: [id number]       Subscriber: patient CM Name: Margaree Mackintosh      Phone#:  [phone]    Fax#: [phone] Pre-Cert#: [id number] from 10/31/22 - 11/12/22 received via email on 10/31/22    Employer: Retired Benefits:  Phone #: [phone]     Name:  Employer:  Benefits:  Phone #:      Name:  Dolores Hoose. Date:       Deduct: n/a      Out of Pocket Max: n/a      Life Max:  CIR: 100%      SNF:  Outpatient:      Co-Pay:  Home Health:       Co-Pay:  DME:      Co-Pay:  Providers:  SECONDARY:       Policy#:      Phone#:    Artist:       Phone#:    The Data processing manager" for patients in Inpatient Rehabilitation Facilities with attached "Privacy Act Statement-Health Care Records" was provided and verbally reviewed with: Patient and Family   Emergency Contact Information Contact Information       Name Relation Home Work Mobile    [place] Spouse [phone]   [phone]         Other Contacts   None on File        Current Medical History  Patient Admitting Diagnosis: R CVA   History of Present Illness: A 72 y.o. male with medical history significant for type 2 diabetes mellitus, hypertension, hyperlipidemia, and history of CVA with residual aphasia and left-sided weakness presented to the [facility]  ED on 10/29/22 with recurrent falls total of 4 in 24 hours with  increased left-sided weakness.  Normally patient is able to ambulate with a walker.  In the ED patient had stable vitals.  EKG showed normal sinus rhythm.  CT scan of the head negative for acute abnormality.  MRI of the brain showed acute infarct in the right midbrain as well as innumerable chronic  microhemorrhages throughout the brain.  Lab was notable for creatinine of 1.7.  Neurology was consulted from the ED and patient was admitted hospital for further evaluation and treatment.  Pt. Was seen by PT/OT/SLP and they recommended CIR to assist return to PLOF.    Complete NIHSS TOTAL: 3   Patient's medical record from [facility]  has been reviewed by the rehabilitation admission coordinator and physician.   Past Medical History         Past Medical History:  Diagnosis Date   Brainstem stroke (HCC) 2015   Cerebral meningioma (HCC)     Diabetes mellitus without complication (HCC)     Glaucoma     Hyperlipidemia     Hypertension             Has the patient had major surgery during 100 days prior to admission? No   Family History   family history includes Diabetes type II in his brother; Stroke in his brother and mother.  Current Medications   Current Medications    Current Facility-Administered Medications:    acetaminophen (TYLENOL) tablet 650 mg, 650 mg, Oral, Q4H PRN **OR** acetaminophen (TYLENOL) 160 MG/5ML solution 650 mg, 650 mg, Per Tube, Q4H PRN **OR** acetaminophen (TYLENOL) suppository 650 mg, 650 mg, Rectal, Q4H PRN, Opyd, Lavone Neri, MD   amLODipine (NORVASC) tablet 10 mg, 10 mg, Oral, Daily, Richardo Priest, Erin C, NP, 10 mg at 10/31/22 0915   aspirin EC tablet 81 mg, 81 mg, Oral, Daily, Erick Blinks, MD, 81 mg at 10/31/22 0915   brinzolamide (AZOPT) 1 % ophthalmic suspension 1 drop, 1 drop, Both Eyes, TID **AND** brimonidine (ALPHAGAN) 0.2 % ophthalmic solution 1 drop, 1 drop, Both Eyes, TID, Pokhrel, Laxman, MD   donepezil (ARICEPT) tablet 10 mg, 10 mg, Oral, QHS, Lehner, Erin C, NP, 10 mg at 10/30/22 2159   ezetimibe (ZETIA) tablet 10 mg, 10 mg, Oral, Daily, Richardo Priest, Erin C, NP, 10 mg at 10/31/22 0915   fesoterodine (TOVIAZ) tablet 4 mg, 4 mg, Oral, Daily, Pokhrel, Laxman, MD   insulin aspart (novoLOG) injection 0-5 Units, 0-5 Units, Subcutaneous,  QHS, Opyd, Timothy S, MD   insulin aspart (novoLOG) injection 0-6 Units, 0-6 Units, Subcutaneous, TID WC, Opyd, Lavone Neri, MD, 2 Units at 10/30/22 1239   memantine (NAMENDA) tablet 10 mg, 10 mg, Oral, BID, Richardo Priest, Erin C, NP, 10 mg at 10/31/22 0915   mirabegron ER (MYRBETRIQ) tablet 25 mg, 25 mg, Oral, Daily, Pokhrel, Laxman, MD   rosuvastatin (CRESTOR) tablet 40 mg, 40 mg, Oral, q1800, Hetty Blend C, NP, 40 mg at 10/30/22 1745   senna-docusate (Senokot-S) tablet 1 tablet, 1 tablet, Oral, QHS PRN, Opyd, Timothy S, MD   sodium chloride 0.9 % bolus 1,000 mL, 1,000 mL, Intravenous, Once, Opyd, Lavone Neri, MD   ticagrelor (BRILINTA) tablet 90 mg, 90 mg, Oral, BID, Hetty Blend C, NP, 90 mg at 10/31/22 0915      Patients Current Diet:  Diet Order                  Diet heart healthy/carb modified Room service appropriate? Yes; Fluid consistency: Thin  Diet effective now                         Precautions / Restrictions Precautions Precautions: Fall Precaution Comments: L hemi Restrictions Weight Bearing Restrictions: No    Has the patient had 2 or more falls or a fall with injury in the past year? Yes   Prior Activity Level Community (5-7x/wk): pt. active in the community PTA   Prior Functional Level Self Care: Did the patient need help bathing, dressing, using the toilet or eating? Independent   Indoor Mobility: Did the patient need assistance with walking from room to room (with or without device)? Independent   Stairs: Did the patient need assistance with internal or external stairs (with or without device)? Independent   Functional Cognition: Did the patient need help planning regular tasks such as shopping or remembering to take medications? Dependent   Patient Information Are you of Hispanic, Latino/a,or Spanish origin?: A. No, not of Hispanic, Latino/a, or Spanish origin What is your race?: B. Black or African American Do you need or want an interpreter to communicate  with a doctor or health care staff?: 0. No   Patient's Response To:  Health Literacy and Transportation Is the patient able to respond to health literacy and transportation needs?: Yes Health Literacy - How often do you need  to have someone help you when you read instructions, pamphlets, or other written material from your doctor or pharmacy?: Sometimes In the past 12 months, has lack of transportation kept you from medical appointments or from getting medications?: No In the past 12 months, has lack of transportation kept you from meetings, work, or from getting things needed for daily living?: No   Home Assistive Devices / Equipment Home Assistive Devices/Equipment: Environmental consultant (specify type), CBG Meter, Blood pressure cuff Home Equipment: Rollator (4 wheels), Cane - single point   Prior Device Use: Indicate devices/aids used by the patient prior to current illness, exacerbation or injury? None of the above   Current Functional Level Cognition   Arousal/Alertness: Awake/alert Overall Cognitive Status: No family/caregiver present to determine baseline cognitive functioning Orientation Level: Oriented to person, Oriented to place, Oriented to time, Disoriented to situation General Comments: Pt pleasant, follows simple commands throughout session, is incontinent of urine & reports this is new. Memory: Impaired Memory Impairment: Storage deficit, Retrieval deficit Executive Function: Sequencing Sequencing: Impaired Sequencing Impairment: Verbal basic    Extremity Assessment (includes Sensation/Coordination)   Upper Extremity Assessment: Defer to OT evaluation, LUE deficits/detail LUE Deficits / Details: LUE weakness 2/2 hx of CVA, exacerbated LUE Coordination: decreased fine motor  Lower Extremity Assessment: LLE deficits/detail LLE Deficits / Details: drags LLE during gait, decreased fine motor     ADLs   Overall ADL's : Needs assistance/impaired Eating/Feeding: Modified independent,  Sitting Eating/Feeding Details (indicate cue type and reason): drinking cup and straw Grooming: Wash/dry hands, Set up Upper Body Bathing: Set up, Sitting Upper Body Bathing Details (indicate cue type and reason): cues to initiate but otherwise demonstrates Lower Body Bathing: Minimal assistance, Set up, Sit to/from stand Lower Body Bathing Details (indicate cue type and reason): pt able to complete majority of bathing in sitting position. pt with balance deficits iwth standing requires external support Upper Body Dressing : Set up Lower Body Dressing: Minimal assistance Lower Body Dressing Details (indicate cue type and reason): don socks. pt needed help due to long toe nails and strings in socks. pt able to thread it and decreased fine motor of L hand Toileting - Clothing Manipulation Details (indicate cue type and reason): on arrival pt incontinence of bladder and reports new. pt incontinence in the bed noted as well. OT taking over session due to need for adl with incontinence Functional mobility during ADLs: Rolling walker (2 wheels), Minimal assistance General ADL Comments: pt needs increased time and outside the RW with turns     Mobility   Overal bed mobility: Needs Assistance Bed Mobility: Supine to Sit Supine to sit: Min assist, Mod assist, HOB elevated, Used rails General bed mobility comments: Pt is able to transfer BLE to EOB but requires min<>mod assist to upright trunk.     Transfers   Overall transfer level: Needs assistance Equipment used: Rolling walker (2 wheels) Transfers: Sit to/from Stand Sit to Stand: Min assist General transfer comment: STS from EOB & recliner, extra time to power up to standing     Ambulation / Gait / Stairs / Wheelchair Mobility   Ambulation/Gait Ambulation/Gait assistance: Editor, commissioning (Feet): 6 Feet Assistive device: Rolling walker (2 wheels) Gait Pattern/deviations: Decreased step length - right, Decreased step length - left,  Decreased dorsiflexion - left, Decreased stride length General Gait Details: Pt ambulates ~4 ft forwards with RW, 4 ft backwards with min assist 2/2 incontinent void & need to return to recliner. During short distance gait, pt  requires cuing to ambulate within base of AD. Pt with decreased LLE hip/knee flexion during swing phase, decreased L foot clearance (pt dragging foot), with absent dorsiflexion & heel strike. Gait velocity: decreased     Posture / Balance Balance Overall balance assessment: Needs assistance Sitting-balance support: Bilateral upper extremity supported, Feet supported Sitting balance-Leahy Scale: Fair Standing balance support: Bilateral upper extremity supported, During functional activity, Reliant on assistive device for balance Standing balance-Leahy Scale: Poor High level balance activites: Turns High Level Balance Comments: high fall risk and outside the RW     Special needs/care consideration None    Previous Home Environment (from acute therapy documentation) Living Arrangements: Spouse/significant other Available Help at Discharge: Family, Available 24 hours/day Type of Home: House Home Layout: One level Home Access: Ramped entrance Bathroom Shower/Tub: Engineer, manufacturing systems: Standard Home Care Services: No   Discharge Living Setting Plans for Discharge Living Setting: Patient's home Type of Home at Discharge: House Discharge Home Layout: One level Discharge Home Access: Ramped entrance Discharge Bathroom Shower/Tub: Tub/shower unit Discharge Bathroom Toilet: Standard Discharge Bathroom Accessibility: Yes How Accessible: Accessible via walker, Accessible via wheelchair Does the patient have any problems obtaining your medications?: No   Social/Family/Support Systems Patient Roles: Spouse Contact Information: [phone] Anticipated Caregiver: Vernie Ammons Anticipated Caregiver's Contact Information: Wife works, son is home while she  works. Ability/Limitations of Caregiver: 24/7 Caregiver Availability: 24/7 Discharge Plan Discussed with Primary Caregiver: Yes Is Caregiver In Agreement with Plan?: Yes Does Caregiver/Family have Issues with Lodging/Transportation while Pt is in Rehab?: No   Goals Patient/Family Goal for Rehab: PT/OT/SLP Supervision to Mod I Expected length of stay: 7-10 days Pt/Family Agrees to Admission and willing to participate: Yes Program Orientation Provided & Reviewed with Pt/Caregiver Including Roles  & Responsibilities: Yes   Decrease burden of Care through IP rehab admission: not anticipated   Possible need for SNF placement upon discharge: not anticiapted    Patient Condition: This patient's condition remains as documented in the consult dated 10/31/22, in which the Rehabilitation Physician determined and documented that the patient's condition is appropriate for intensive rehabilitative care in an inpatient rehabilitation facility. Will admit to inpatient rehab today.   Preadmission Screen Completed By:  Jeronimo Greaves, 10/31/2022 12:10 PM ______________________________________________________________________   Discussed status with Dr. Riley Kill on 11/01/22 at 0945 and received approval for admission today.   Admission Coordinator:  Moises Blood, time 1245/Date 11/01/22              Cosigned by: Genice Rouge, MD at 11/01/2022  1:38 PM   Revision History

## 2022-11-01 NOTE — Plan of Care (Signed)
Patient discharged to [facility] via wheelchair with a nurse tech. Report called. All belongings including phone and eye drops in patient belong bag with patient. Son at bedside and notified of new room.    Problem: Consults Goal: RH STROKE PATIENT EDUCATION Description: See Patient Education module for education specifics  Outcome: Progressing   Problem: RH BOWEL ELIMINATION Goal: RH STG MANAGE BOWEL WITH ASSISTANCE Description: STG Manage Bowel with toileting Assistance. Outcome: Progressing Goal: RH STG MANAGE BOWEL W/MEDICATION W/ASSISTANCE Description: STG Manage Bowel with Medication with mod I Assistance. Outcome: Progressing   Problem: RH BLADDER ELIMINATION Goal: RH STG MANAGE BLADDER WITH ASSISTANCE Description: STG Manage Bladder With mod I Assistance Outcome: Progressing Goal: RH STG MANAGE BLADDER WITH MEDICATION WITH ASSISTANCE Description: STG Manage Bladder With Medication With mod I Assistance. Outcome: Progressing   Problem: RH SAFETY Goal: RH STG ADHERE TO SAFETY PRECAUTIONS W/ASSISTANCE/DEVICE Description: STG Adhere to Safety Precautions With cues Assistance/Device. Outcome: Progressing   Problem: RH KNOWLEDGE DEFICIT Goal: RH STG INCREASE KNOWLEDGE OF DIABETES Description: Patient and wife will be able to manage DM with medications and dietary modifications using educational resources independently Outcome: Progressing Goal: RH STG INCREASE KNOWLEDGE OF HYPERTENSION Description: Patient and wife will be able to manage HTN with medications and dietary modifications using educational resources independently Outcome: Progressing Goal: RH STG INCREASE KNOWLEGDE OF HYPERLIPIDEMIA Description: Patient and wife will be able to manage HLD with medications and dietary modifications using educational resources independently Outcome: Progressing Goal: RH STG INCREASE KNOWLEDGE OF STROKE PROPHYLAXIS Description: Patient and wife will be able to manage secondary risks with  medications and dietary modifications using educational resources independently Outcome: Progressing

## 2022-11-02 DIAGNOSIS — I6350 Cerebral infarction due to unspecified occlusion or stenosis of unspecified cerebral artery: Secondary | ICD-10-CM | POA: Diagnosis not present

## 2022-11-02 LAB — COMPREHENSIVE METABOLIC PANEL
ALT: 20 U/L (ref 0–44)
AST: 28 U/L (ref 15–41)
Albumin: 3.4 g/dL — ABNORMAL LOW (ref 3.5–5.0)
Alkaline Phosphatase: 100 U/L (ref 38–126)
Anion gap: 9 (ref 5–15)
BUN: 21 mg/dL (ref 8–23)
CO2: 24 mmol/L (ref 22–32)
Calcium: 8.8 mg/dL — ABNORMAL LOW (ref 8.9–10.3)
Chloride: 107 mmol/L (ref 98–111)
Creatinine, Ser: 1.47 mg/dL — ABNORMAL HIGH (ref 0.61–1.24)
GFR, Estimated: 50 mL/min — ABNORMAL LOW (ref >60)
Glucose, Bld: 90 mg/dL (ref 70–99)
Potassium: 4.7 mmol/L (ref 3.5–5.1)
Sodium: 140 mmol/L (ref 135–145)
Total Bilirubin: 0.9 mg/dL (ref 0.3–1.2)
Total Protein: 6.2 g/dL — ABNORMAL LOW (ref 6.5–8.1)

## 2022-11-02 LAB — CBC WITH DIFFERENTIAL/PLATELET
Abs Immature Granulocytes: 0.01 10*3/uL (ref 0.00–0.07)
Basophils Absolute: 0 10*3/uL (ref 0.0–0.1)
Basophils Relative: 1 %
Eosinophils Absolute: 0.1 10*3/uL (ref 0.0–0.5)
Eosinophils Relative: 2 %
HCT: 38.6 % — ABNORMAL LOW (ref 39.0–52.0)
Hemoglobin: 12.2 g/dL — ABNORMAL LOW (ref 13.0–17.0)
Immature Granulocytes: 0 %
Lymphocytes Relative: 33 %
Lymphs Abs: 1.2 10*3/uL (ref 0.7–4.0)
MCH: 29.1 pg (ref 26.0–34.0)
MCHC: 31.6 g/dL (ref 30.0–36.0)
MCV: 92.1 fL (ref 80.0–100.0)
Monocytes Absolute: 0.4 10*3/uL (ref 0.1–1.0)
Monocytes Relative: 12 %
Neutro Abs: 1.9 10*3/uL (ref 1.7–7.7)
Neutrophils Relative %: 52 %
Platelets: 217 10*3/uL (ref 150–400)
RBC: 4.19 MIL/uL — ABNORMAL LOW (ref 4.22–5.81)
RDW: 13 % (ref 11.5–15.5)
WBC: 3.6 10*3/uL — ABNORMAL LOW (ref 4.0–10.5)
nRBC: 0 % (ref 0.0–0.2)

## 2022-11-02 LAB — GLUCOSE, CAPILLARY
Glucose-Capillary: 113 mg/dL — ABNORMAL HIGH (ref 70–99)
Glucose-Capillary: 197 mg/dL — ABNORMAL HIGH (ref 70–99)
Glucose-Capillary: 91 mg/dL (ref 70–99)
Glucose-Capillary: 98 mg/dL (ref 70–99)

## 2022-11-02 LAB — VITAMIN D 25 HYDROXY (VIT D DEFICIENCY, FRACTURES): Vit D, 25-Hydroxy: 49.45 ng/mL (ref 30–100)

## 2022-11-02 NOTE — Evaluation (Signed)
Physical Therapy Assessment and Plan  Patient Details  Name: James Henderson MRN: [id number] Date of Birth: [birth year]  PT Diagnosis: Abnormality of gait, Ataxia, Coordination disorder, Difficulty walking, Impaired sensation, and Muscle weakness Rehab Potential: Good ELOS: 17-21 days   Today's Date: 11/02/2022 PT Individual Time: 1300-1415 PT Individual Time Calculation (min): 75 min    Hospital Problem: Principal Problem:   Posterior circulation stroke (HCC) Active Problems:   Stroke (cerebrum) (HCC)   Past Medical History:  Past Medical History:  Diagnosis Date   Brainstem stroke (HCC) 2015   Cerebral meningioma (HCC)    Diabetes mellitus without complication (HCC)    Glaucoma    Hyperlipidemia    Hypertension    Past Surgical History:  Past Surgical History:  Procedure Laterality Date   BIOPSY  02/15/2020   Procedure: BIOPSY;  Surgeon: Kerin Salen, MD;  Location: [facility];  Service: Gastroenterology;;   COLONOSCOPY WITH PROPOFOL N/A 02/15/2020   Procedure: COLONOSCOPY WITH PROPOFOL;  Surgeon: Kerin Salen, MD;  Location: [facility];  Service: Gastroenterology;  Laterality: N/A;   EYE SURGERY     POLYPECTOMY  02/15/2020   Procedure: POLYPECTOMY;  Surgeon: Kerin Salen, MD;  Location: [facility];  Service: Gastroenterology;;    Assessment & Plan Clinical Impression: Patient is a 72 y.o. male with medical history significant for type 2 diabetes mellitus, hypertension, hyperlipidemia, and history of CVA with residual aphasia and left-sided weakness presented to the [facility]  ED on 10/29/22 with recurrent falls total of 4 in 24 hours with  increased left-sided weakness.  Normally patient is able to ambulate with a walker.  In the ED patient had stable vitals.  EKG showed normal sinus rhythm.  CT scan of the head negative for acute abnormality.  MRI of the brain showed acute infarct in the right midbrain as well as innumerable chronic microhemorrhages  throughout the brain.  Lab was notable for creatinine of 1.7.  Neurology was consulted from the ED and patient was admitted hospital for further evaluation and treatment. . Patient transferred to CIR on 11/01/2022 .   Patient currently requires mod with mobility secondary to muscle weakness, decreased cardiorespiratoy endurance, and decreased standing balance, decreased postural control, hemiplegia, and decreased balance strategies.  Prior to hospitalization, patient was supervision with mobility and lived with  (Pt is questionable historian at times, different responses to questions vs OT this am) in a House home.  Home access is  Level entry.  Patient will benefit from skilled PT intervention to maximize safe functional mobility, minimize fall risk, and decrease caregiver burden for planned discharge home with 24 hour assist.  Anticipate patient will benefit from follow up [facility] at discharge.  PT - End of Session Activity Tolerance: Tolerates 30+ min activity with multiple rests Endurance Deficit: Yes PT Assessment Rehab Potential (ACUTE/IP ONLY): Good PT Barriers to Discharge: Inaccessible home environment;Decreased caregiver support PT Patient demonstrates impairments in the following area(s): Balance;Safety;Endurance;Motor;Skin Integrity;Sensory PT Transfers Functional Problem(s): Bed Mobility;Bed to Chair;Car PT Locomotion Functional Problem(s): Ambulation;Wheelchair Mobility;Stairs PT Plan PT Intensity: Minimum of 1-2 x/day ,45 to 90 minutes PT Frequency: 5 out of 7 days PT Duration Estimated Length of Stay: 17-21 days PT Treatment/Interventions: Ambulation/gait training;Cognitive remediation/compensation;Discharge planning;DME/adaptive equipment instruction;Functional mobility training;Pain management;Psychosocial support;Splinting/orthotics;Therapeutic Activities;UE/LE Strength taining/ROM;Visual/perceptual remediation/compensation;Wheelchair propulsion/positioning;UE/LE Coordination  activities;Stair training;Skin care/wound management;Therapeutic Exercise;Patient/family education;Neuromuscular re-education;Functional electrical stimulation;Disease management/prevention;Balance/vestibular training;Community reintegration PT Transfers Anticipated Outcome(s): CGA transfers with LRAD PT Locomotion Anticipated Outcome(s): CGA gait with LRAD PT Recommendation Follow Up Recommendations: Home  health PT Patient destination: Home Equipment Recommended: To be determined   PT Evaluation Precautions/Restrictions Precautions Precautions: Fall Precaution Comments: L hemi Restrictions Weight Bearing Restrictions: No  Pain Interference Pain Interference Pain Effect on Sleep: 0. Does not apply - I have not had any pain or hurting in the past 5 days Pain Interference with Day-to-Day Activities: 1. Rarely or not at all Home Living/Prior Functioning Home Living Living Arrangements: Spouse/significant other;Children Available Help at Discharge: Family;Available 24 hours/day Type of Home: House Home Access: Level entry Home Layout: One level  Lives With:  (Pt is questionable historian at times, different responses to questions vs OT this am) Prior Function Level of Independence: Requires assistive device for independence;Independent with gait;Independent with transfers  Able to Take Stairs?: No Driving: No Vocation: Retired Optometrist - History Ability to See in Adequate Light: 1 Impaired Perception Perception: Impaired Praxis Praxis: Impaired Praxis Impairment Details: Motor planning;Limb apraxia  Cognition Overall Cognitive Status: History of cognitive impairments - at baseline Arousal/Alertness: Awake/alert Orientation Level: Oriented X4 Year: 2024 Month: September Day of Week: Correct Attention: Sustained;Focused Focused Attention: Appears intact Sustained Attention: Appears intact Memory: Impaired Memory Impairment: Decreased recall of new  information;Decreased short term memory Decreased Short Term Memory: Verbal basic;Functional basic Awareness: Appears intact Problem Solving: Impaired Problem Solving Impairment: Verbal basic;Functional basic Executive Function: Decision Making;Self Monitoring;Sequencing Sequencing: Impaired Sequencing Impairment: Verbal basic;Functional basic Decision Making: Impaired Decision Making Impairment: Verbal basic;Functional basic Self Monitoring: Impaired Self Monitoring Impairment: Verbal basic;Functional basic Safety/Judgment: Impaired Sensation Sensation Light Touch: Appears Intact Hot/Cold: Appears Intact Proprioception: Appears Intact Stereognosis: Appears Intact Coordination Gross Motor Movements are Fluid and Coordinated: No Motor  Motor Motor: Hemiplegia   Trunk/Postural Assessment  Cervical Assessment Cervical Assessment: Within Functional Limits Thoracic Assessment Thoracic Assessment: Within Functional Limits Lumbar Assessment Lumbar Assessment: Within Functional Limits Postural Control Postural Control: Deficits on evaluation  Balance Balance Balance Assessed: Yes Extremity Assessment      RLE Assessment RLE Assessment: Exceptions to [facility] General Strength Comments: Grossly 3+/5 LLE Assessment LLE Assessment: Exceptions to [facility] General Strength Comments: Grossly 3+/5  Care Tool Care Tool Bed Mobility Roll left and right activity   Roll left and right assist level: Maximal Assistance - Patient 25 - 49%    Sit to lying activity   Sit to lying assist level: Maximal Assistance - Patient 25 - 49%    Lying to sitting on side of bed activity   Lying to sitting on side of bed assist level: the ability to move from lying on the back to sitting on the side of the bed with no back support.: Maximal Assistance - Patient 25 - 49%     Care Tool Transfers Sit to stand transfer   Sit to stand assist level: Moderate Assistance - Patient 50 - 74%    Chair/bed  transfer   Chair/bed transfer assist level: Moderate Assistance - Patient 50 - 74%     Toilet transfer   Assist Level: Moderate Assistance - Patient 50 - 74%    Car transfer   Car transfer assist level: Moderate Assistance - Patient 50 - 74%      Care Tool Locomotion Ambulation   Assist level: Moderate Assistance - Patient 50 - 74% Assistive device: Walker-rolling Max distance: 50 ft  Walk 10 feet activity   Assist level: Moderate Assistance - Patient - 50 - 74% Assistive device: Walker-rolling   Walk 50 feet with 2 turns activity   Assist level: Moderate Assistance - Patient -  50 - 74% Assistive device: Walker-rolling  Walk 150 feet activity Walk 150 feet activity did not occur: Safety/medical concerns      Walk 10 feet on uneven surfaces activity Walk 10 feet on uneven surfaces activity did not occur: Safety/medical concerns      Stairs   Assist level: Moderate Assistance - Patient - 50 - 74% Stairs assistive device: 2 hand rails Max number of stairs: 16 (3")  Walk up/down 1 step activity   Walk up/down 1 step (curb) assist level: Moderate Assistance - Patient - 50 - 74% Walk up/down 1 step or curb assistive device: 2 hand rails  Walk up/down 4 steps activity   Walk up/down 4 steps assist level: Moderate Assistance - Patient - 50 - 74% Walk up/down 4 steps assistive device: 2 hand rails  Walk up/down 12 steps activity   Walk up/down 12 steps assist level: Moderate Assistance - Patient - 50 - 74% Walk up/down 12 steps assistive device: 2 hand rails  Pick up small objects from floor Pick up small object from the floor (from standing position) activity did not occur: Safety/medical concerns      Wheelchair Is the patient using a wheelchair?: Yes Type of Wheelchair: Manual   Wheelchair assist level: Moderate Assistance - Patient 50 - 74% Max wheelchair distance: 100 ft  Wheel 50 feet with 2 turns activity   Assist Level: Moderate Assistance - Patient 50 - 74%  Wheel  150 feet activity   Assist Level: Maximal Assistance - Patient 25 - 49%    Refer to Care Plan for Long Term Goals  SHORT TERM GOAL WEEK 1 PT Short Term Goal 1 (Week 1): Pt will navigate 6" stairs with assist PT Short Term Goal 2 (Week 1): Pt will perform STS with min A consistently PT Short Term Goal 3 (Week 1): Pt will ambulate 100 ft with assist  Recommendations for other services: None   Skilled Therapeutic Intervention Evaluation completed (see details above) with patient education regarding purpose of PT evaluation, PT POC and goals, therapy schedule, weekly team meetings, and other CIR information including safety plan and fall risk safety.  Pt performed the below functional mobility tasks with the specified levels of skilled cuing and assistance.  History gathered, however pt notably answered differently with this therapist and OT, so history is in question.  Pt requires mod A overall for mobility, including gait up to 50 ft. During gait, pt demoes significant LLE drag with external rotation. Pt requires max cueing for safety and sometimes sequencing of transfers. Pt returned to recliner after session and was left with all needs in reach and alarm active.   Mobility Bed Mobility Bed Mobility: Rolling Right Rolling Right: Moderate Assistance - Patient 50-74% Transfers Transfers: Sit to Stand;Stand Pivot Transfers Sit to Stand: Moderate Assistance - Patient 50-74% Stand Pivot Transfers: Moderate Assistance - Patient 50 - 74% Transfer (Assistive device): Rolling walker Locomotion  Gait Gait Distance (Feet): 50 Feet Gait Gait: Yes Gait Pattern: Impaired Gait Pattern: Decreased weight shift to right;Trunk flexed;Left foot flat;Decreased hip/knee flexion - left;Decreased dorsiflexion - left Gait velocity: decreased Stairs / Additional Locomotion Stairs: Yes Stairs Assistance: Moderate Assistance - Patient 50 - 74% Stair Management Technique: Two rails Number of Stairs:  16 Height of Stairs: 3 Wheelchair Mobility Wheelchair Mobility: Yes Wheelchair Assistance: Moderate Assistance - Patient 50 - 74% Wheelchair Propulsion: Both upper extremities Wheelchair Parts Management: Needs assistance Distance: 111ft   Discharge Criteria: Patient will be discharged from PT if  patient refuses treatment 3 consecutive times without medical reason, if treatment goals not met, if there is a change in medical status, if patient makes no progress towards goals or if patient is discharged from hospital.  The above assessment, treatment plan, treatment alternatives and goals were discussed and mutually agreed upon: by patient  Juluis Rainier 11/02/2022, 3:59 PM

## 2022-11-02 NOTE — Progress Notes (Signed)
Inpatient Rehabilitation Admission Medication Review by a Pharmacist  A complete drug regimen review was completed for this patient to identify any potential clinically significant medication issues.  High Risk Drug Classes Is patient taking? Indication by Medication  Antipsychotic Yes Compazine PRN- n/v   Anticoagulant Yes Lovenox- DVT ppx   Antibiotic No   Opioid No   Antiplatelet Yes bASA,brillinta (planned DAPT for 4 weeks, then aspirin alone- EOT 11/27/22)- CVA   Hypoglycemics/insulin Yes Metformin, SSI- DM2   Vasoactive Medication Yes Amlodipine- HTN   Chemotherapy No   Other Yes Aricept, memantine- dementia  Fesoterodine,myrbetriq- overactive bladder  Crestor, ezetimibe- HLD/CVA  Benadryl PRN- itching  Melatonin PRN- sleep      Type of Medication Issue Identified Description of Issue Recommendation(s)  Drug Interaction(s) (clinically significant)     Duplicate Therapy     Allergy     No Medication Administration End Date     Incorrect Dose     Additional Drug Therapy Needed     Significant med changes from prior encounter (inform family/care partners about these prior to discharge).    Other  PTA meds not resumed: glipizide, losartan, tolterodine, vibagreon  Clopidogrel discontinued  Resume at discharge if clinically indicated    Clinically significant medication issues were identified that warrant physician communication and completion of prescribed/recommended actions by midnight of the next day:  No  Name of provider notified for urgent issues identified:   Provider Method of Notification:     Pharmacist comments:   Time spent performing this drug regimen review (minutes):  20   Jani Gravel, PharmD Clinical Pharmacist  11/02/2022 7:54 AM

## 2022-11-02 NOTE — Progress Notes (Signed)
PROGRESS NOTE   Subjective Complaints: No new complaints this morning Discussed elevated blood sugars and advised avoiding added sugars in diet Working with SLP    Objective:   No results found. Recent Labs    11/01/22 0645 11/02/22 1615  WBC 5.0 3.6*  HGB 11.8* 12.2*  HCT 36.8* 38.6*  PLT 226 217   Recent Labs    11/01/22 0645 11/02/22 1615  NA 139 140  K 3.8 4.7  CL 107 107  CO2 23 24  GLUCOSE 126* 90  BUN 17 21  CREATININE 1.40* 1.47*  CALCIUM 8.7* 8.8*    Intake/Output Summary (Last 24 hours) at 11/02/2022 1935 Last data filed at 11/02/2022 1744 Gross per 24 hour  Intake 598 ml  Output 100 ml  Net 498 ml        Physical Exam: Vital Signs Blood pressure 121/63, pulse 76, temperature 98.6 F (37 C), resp. rate 16, height 5\' 6"  (1.676 m), weight 74.1 kg, SpO2 100%. Gen: no distress, normal appearing    General:        Right eye: No discharge.        Left eye: No discharge.     Extraocular Movements: Extraocular movements intact.     Left eye: Abnormal extraocular motion present.  Cardiovascular:     Rate and Rhythm: Normal rate. Rhythm irregular.     Heart sounds: Normal heart sounds. No murmur heard.    No gallop.  Pulmonary:     Effort: Pulmonary effort is normal. No respiratory distress.     Breath sounds: Normal breath sounds. No wheezing, rhonchi or rales.  Abdominal:     General: Bowel sounds are normal. There is no distension.     Palpations: Abdomen is soft.     Tenderness: There is no abdominal tenderness.  Musculoskeletal:     Cervical back: Neck supple. No tenderness.     Comments: RUE 5-/5 RLE 5-/5 LUE posturing 4+/5 in biceps, triceps, WE, grip and FA LLE HF 4/5; KE/DF and PF 5-/5  Neurological:     Mental Status: He is alert and oriented to person, place, and time.     Comments: Dysarthric and ataxic speech. Sitting at EOB with good balance. He was able to answer  orientation questions and able to follow simple one step commands.  Very dysarthric- hard to understand No clonus; no hoffman's no increased tone B/L Intact to light touch in all  4 extremities  Psychiatric:     Comments: flat    Assessment/Plan: 1. Functional deficits which require 3+ hours per day of interdisciplinary therapy in a comprehensive inpatient rehab setting. Physiatrist is providing close team supervision and 24 hour management of active medical problems listed below. Physiatrist and rehab team continue to assess barriers to discharge/monitor patient progress toward functional and medical goals  Care Tool:  Bathing              Bathing assist Assist Level: Moderate Assistance - Patient 50 - 74%     Upper Body Dressing/Undressing Upper body dressing   What is the patient wearing?: Hospital gown only    Upper body assist Assist Level: Minimal Assistance - Patient >  75%    Lower Body Dressing/Undressing Lower body dressing      What is the patient wearing?: Pants, Incontinence brief     Lower body assist Assist for lower body dressing: Maximal Assistance - Patient 25 - 49%     Toileting Toileting    Toileting assist Assist for toileting: Total Assistance - Patient < 25%     Transfers Chair/bed transfer  Transfers assist     Chair/bed transfer assist level: Moderate Assistance - Patient 50 - 74%     Locomotion Ambulation   Ambulation assist      Assist level: Moderate Assistance - Patient 50 - 74% Assistive device: Walker-rolling Max distance: 50 ft   Walk 10 feet activity   Assist     Assist level: Moderate Assistance - Patient - 50 - 74% Assistive device: Walker-rolling   Walk 50 feet activity   Assist    Assist level: Moderate Assistance - Patient - 50 - 74% Assistive device: Walker-rolling    Walk 150 feet activity   Assist Walk 150 feet activity did not occur: Safety/medical concerns         Walk 10 feet on  uneven surface  activity   Assist Walk 10 feet on uneven surfaces activity did not occur: Safety/medical concerns         Wheelchair     Assist Is the patient using a wheelchair?: Yes Type of Wheelchair: Manual    Wheelchair assist level: Moderate Assistance - Patient 50 - 74% Max wheelchair distance: 100 ft    Wheelchair 50 feet with 2 turns activity    Assist        Assist Level: Moderate Assistance - Patient 50 - 74%   Wheelchair 150 feet activity     Assist      Assist Level: Maximal Assistance - Patient 25 - 49%   Blood pressure 121/63, pulse 76, temperature 98.6 F (37 C), resp. rate 16, height 5\' 6"  (1.676 m), weight 74.1 kg, SpO2 100%.  Medical Problem List and Plan: 1. 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  Antithrombotics: -DVT/anticoagulation:  Pharmaceutical: Lovenox             -antiplatelet therapy: ASA/Brilinta X 4 weeks followed by ASA alone.  3. Pain Management:  Tylenol prn.  4. Mood/Behavior/Sleep: LCSW to follow for evaluation and support.              -antipsychotic agents: N/A 5. Neuropsych/cognition: This patient is capable of making decisions on his own behalf. 6. Skin/Wound Care: Routine pressure relief measures 7. Fluids/Electrolytes/Nutrition: Monitor I/O. Check CMET in am.  8. HTN: Monitor BP TID. Continue Amlodipine. Continue to hold Losartan.  9. T2DM: Hgb A1c- 7.2--on glipizide and metformin PTA             --will resume metformin and monitor for need to resume glipizide             --monitor BS as/hs and use SSI for elevated BS.  10. Acute on chronic renal failure: SCr 1.7 at admission-->baseline likely 1.3-1.4 range             --improved with IVF for hydration and now back to baseline.              --  avoid nephrotoxic drugs. Recheck BUN/SCr in am  11. Dementia: Continue Namenda and Aricept.  Delirium precautions.   12. Low HDL:  LDL-58 and at goal. Continue Crestor and Zetia.  13. Hyperactive bladder?: Continue Tiovaz and Mirabegron.    14. Screening for vitamin D deficiency: add on vitamin D level    LOS: 1 days A FACE TO FACE EVALUATION WAS PERFORMED  Drema Pry Aniko Finnigan 11/02/2022, 7:35 PM

## 2022-11-02 NOTE — Evaluation (Signed)
Speech Language Pathology Assessment and Plan  Patient Details  Name: James Henderson MRN: [id number] Date of Birth: 10/29/1950  SLP Diagnosis: Cognitive Impairments;Dysarthria;Speech and Language deficits  Rehab Potential: Fair ELOS:   17-21 Days  Today's Date: 11/02/2022 SLP Individual Time: 4403-4742 SLP Individual Time Calculation (min): 60 min   Hospital Problem: Principal Problem:   Posterior circulation stroke (HCC) Active Problems:   Stroke (cerebrum) (HCC)  Past Medical History:  Past Medical History:  Diagnosis Date   Brainstem stroke (HCC) 2015   Cerebral meningioma (HCC)    Diabetes mellitus without complication (HCC)    Glaucoma    Hyperlipidemia    Hypertension    Past Surgical History:  Past Surgical History:  Procedure Laterality Date   BIOPSY  02/15/2020   Procedure: BIOPSY;  Surgeon: Kerin Salen, MD;  Location: [facility];  Service: Gastroenterology;;   COLONOSCOPY WITH PROPOFOL N/A 02/15/2020   Procedure: COLONOSCOPY WITH PROPOFOL;  Surgeon: Kerin Salen, MD;  Location: [facility];  Service: Gastroenterology;  Laterality: N/A;   EYE SURGERY     POLYPECTOMY  02/15/2020   Procedure: POLYPECTOMY;  Surgeon: Kerin Salen, MD;  Location: [facility];  Service: Gastroenterology;;    Assessment / Plan / Recommendation Clinical Impression  Pt is a 72 year old RH-male with history of T2DM, HTN, meningioma, dementia, prior brainstem stroke with residual left sided weakness and aphasia who was admitted on 10/29/22 with 4 day history of increased fall and increase in left sided weakness with tendency to drag his leg. MRI brain done revealing small acute infarct in right midbrain with innumerable chronic microhemorrhages throughout the brain, generalized volume loss and unchanged small meningioma.  2D echo showed EF 60-65% with nor wall abnormality, aortic sclerosis and mild concentric LVH. He was found to have aphasia, ataxia, dysconjugate gaze and dysarthria. Dr.  Pearlean Brownie felt that stroke likely due to small vessel disease and recommended ASA/Brillinta X 4 weeks followed by ASA alone and discontinue home plavix. .     At baseline--[facility] a [facility] and family provides supervision when not at [facility]. From [place] --has been in [place] for 40 years. Married- has three children (2 adopted). He was able to ambulate with walker PTA. Therapy has been working with patient who is limited by weakness and requires min assist overall. CIR recommended due to functional decline.  Cognitive-communication eval: Pt presented w/ moderate mixed dysarthria characterized by harsh vocal quality, reduced articulatory precision, variable prosody, and slow/effortful expression. Judged to be ~60% intelligible at sentence level w/o context. He was Aox4 and able to describe the concept of a CVA, but required assistance w/ specific word finding to ID exact term. During structured eval tasks, he presented w/ moderate naming, memory, problem solving, selective attention, and auditory comprehension deficits overall. Pt's son present towards the end of eval and reported no apparent change in cognitive-linguistic function w/ recent CVA. Also spoke w/ pt's wife on the phone who confirmed pt is at his cognitive-linguistic baseline. Additionally, pt presents w/ little to no cognitive demand at home and 24 hour supervision given dx of dementia and hx of prev CVA. Skilled ST services not warranted, as pt demonstrates return to cognitive-linguistic baseline and adequate support provided for d/c plans.   Bedside swallow eval: Pt provided w/ morning meal (regular textures and thin liquids). Only observed w/ the first half of his meal given time constraints, however, no overt s/s of airway invasion present and adequate oral clearance noted. Dysphagia intervention not  warranted.      Skilled Therapeutic Interventions          SLP facilitated cognitive-linguistic evaluation and bedside swallow  evaluation. See above for more details.   SLP Assessment  Patient does not need any further Speech Lanaguage Pathology Services    Recommendations  SLP Diet Recommendations: Age appropriate regular solids;Thin Liquid Administration via: Straw;Cup Medication Administration: Whole meds with liquid Supervision: Patient able to self feed Oral Care Recommendations: Oral care BID Recommendations for Other Services: Therapeutic Recreation consult Therapeutic Recreation Interventions: Pet therapy;Stress management Patient destination: Home Follow up Recommendations: 24 hour supervision/assistance    SLP Frequency   N/a  SLP Duration  SLP Intensity  SLP Treatment/Interventions  N/a   N/a    N/a   Pain  No pain reported  Prior Functioning Type of Home: House  Lives With:  (Pt is questionable historian at times, different responses to questions vs OT this am) Available Help at Discharge: Family;Available 24 hours/day Vocation: Retired  Architectural technologist Overall Cognitive Status: History of cognitive impairments - at baseline Arousal/Alertness: Awake/alert Orientation Level: Oriented X4 Year: 2024 Month: September Day of Week: Correct Attention: Sustained;Focused Focused Attention: Appears intact Sustained Attention: Appears intact Memory: Impaired Memory Impairment: Decreased recall of new information;Decreased short term memory Decreased Short Term Memory: Verbal basic;Functional basic Awareness: Appears intact Problem Solving: Impaired Problem Solving Impairment: Verbal basic;Functional basic Executive Function: Decision Making;Self Monitoring;Sequencing Sequencing: Impaired Sequencing Impairment: Verbal basic;Functional basic Decision Making: Impaired Decision Making Impairment: Verbal basic;Functional basic Self Monitoring: Impaired Self Monitoring Impairment: Verbal basic;Functional basic Safety/Judgment: Impaired  Comprehension Auditory  Comprehension Overall Auditory Comprehension: Impaired Yes/No Questions: Within Functional Limits Commands: Impaired One Step Basic Commands: 75-100% accurate Two Step Basic Commands: 75-100% accurate Multistep Basic Commands: 75-100% accurate Conversation: Simple Interfering Components: Attention;Working Radio broadcast assistant: Dietitian: Not tested Reading Comprehension Reading Status: Not tested Expression Expression Primary Mode of Expression: Verbal Verbal Expression Overall Verbal Expression: Impaired Initiation: No impairment Naming: Impairment Responsive: 76-100% accurate Confrontation: Impaired Divergent: 50-74% accurate Verbal Errors: Semantic paraphasias;Phonemic paraphasias Pragmatics: Impairment Impairments: Dysprosody;Topic maintenance Interfering Components: Speech intelligibility Effective Techniques: Open ended questions Non-Verbal Means of Communication: Not applicable Written Expression Dominant Hand: Right Written Expression: Not tested Oral Motor Oral Motor/Sensory Function Overall Oral Motor/Sensory Function: Moderate impairment Facial ROM: Reduced left;Reduced right Facial Strength: Reduced right;Reduced left Lingual ROM: Reduced right;Reduced left Motor Speech Overall Motor Speech: Impaired Respiration: Within functional limits Phonation: Low vocal intensity Resonance: Within functional limits Articulation: Impaired Level of Impairment: Phrase Intelligibility: Intelligibility reduced Word: 75-100% accurate Phrase: 50-74% accurate Sentence: 50-74% accurate Conversation: 25-49% accurate Motor Speech Errors: Aware Effective Techniques: Slow rate;Increased vocal intensity;Over-articulate  Care Tool Care Tool Cognition Ability to hear (with hearing aid or hearing appliances if normally used Ability to hear (with hearing aid or hearing appliances if normally used): 1.  Minimal difficulty - difficulty in some environments (e.g. when person speaks softly or setting is noisy)   Expression of Ideas and Wants Expression of Ideas and Wants: 2. Frequent difficulty - frequently exhibits difficulty with expressing needs and ideas   Understanding Verbal and Non-Verbal Content Understanding Verbal and Non-Verbal Content: 3. Usually understands - understands most conversations, but misses some part/intent of message. Requires cues at times to understand  Memory/Recall Ability Memory/Recall Ability : Current season;That he or she is in a hospital/hospital unit   Motor Speech Assessment  Intelligibility: Intelligibility reduced Word: 75-100% accurate Phrase: 50-74% accurate Sentence: 50-74% accurate Conversation: 25-49% accurate  Bedside Swallowing Assessment General Previous Swallow Assessment: n/a Diet Prior to this Study: Regular;Thin liquids (Level 0) Temperature Spikes Noted: No Respiratory Status: Room air Behavior/Cognition: Alert;Cooperative;Pleasant mood Oral Cavity - Dentition: Missing dentition Self-Feeding Abilities: Able to feed self Patient Positioning: Upright in bed Baseline Vocal Quality: Other (comment) (harsh) Volitional Swallow: Able to elicit  Oral Care Assessment Oral Assessment  (WDL): Exceptions to WDL Lips: Asymmetrical Teeth: Missing (Comment) Tongue: Pink;Moist Mucous Membrane(s): Moist;Pink Saliva: Moist, saliva free flowing Level of Consciousness: Alert Is patient on any of following O2 devices?: None of the above Nutritional status: No high risk factors Oral Assessment Risk : Low Risk  Short Term Goals: No short term goals set  Refer to Care Plan for Long Term Goals  Recommendations for other services: Therapeutic Recreation  Pet therapy and Stress management  Discharge Criteria: Patient will be discharged from SLP if patient refuses treatment 3 consecutive times without medical reason, if treatment goals not met, if  there is a change in medical status, if patient makes no progress towards goals or if patient is discharged from hospital.  The above assessment, treatment plan, treatment alternatives and goals were discussed and mutually agreed upon: by family  Pati Gallo 11/02/2022, 3:57 PM

## 2022-11-02 NOTE — Plan of Care (Signed)
  Problem: RH Eating Goal: LTG Patient will perform eating w/assist, cues/equip (OT) Description: LTG: Patient will perform eating with assist, with/without cues using equipment (OT) Flowsheets (Taken 11/02/2022 1118) LTG: Pt will perform eating with assistance level of: Independent with assistive device    Problem: RH Grooming Goal: LTG Patient will perform grooming w/assist,cues/equip (OT) Description: LTG: Patient will perform grooming with assist, with/without cues using equipment (OT) Flowsheets (Taken 11/02/2022 1118) LTG: Pt will perform grooming with assistance level of: Independent   Problem: RH Bathing Goal: LTG Patient will bathe all body parts with assist levels (OT) Description: LTG: Patient will bathe all body parts with assist levels (OT) Flowsheets (Taken 11/02/2022 1118) LTG: Pt will perform bathing with assistance level/cueing: Supervision/Verbal cueing LTG: Position pt will perform bathing: Shower   Problem: RH Dressing Goal: LTG Patient will perform upper body dressing (OT) Description: LTG Patient will perform upper body dressing with assist, with/without cues (OT). Flowsheets (Taken 11/02/2022 1118) LTG: Pt will perform upper body dressing with assistance level of: Set up assist Goal: LTG Patient will perform lower body dressing w/assist (OT) Description: LTG: Patient will perform lower body dressing with assist, with/without cues in positioning using equipment (OT) Flowsheets (Taken 11/02/2022 1118) LTG: Pt will perform lower body dressing with assistance level of: Contact Guard/Touching assist   Problem: RH Toileting Goal: LTG Patient will perform toileting task (3/3 steps) with assistance level (OT) Description: LTG: Patient will perform toileting task (3/3 steps) with assistance level (OT)  Flowsheets (Taken 11/02/2022 1118) LTG: Pt will perform toileting task (3/3 steps) with assistance level: Contact Guard/Touching assist   Problem: RH Functional Use of Upper  Extremity Goal: LTG Patient will use RT/LT upper extremity as a (OT) Description: LTG: Patient will use right/left upper extremity as a stabilizer/gross assist/diminished/nondominant/dominant level with assist, with/without cues during functional activity (OT) Flowsheets (Taken 11/02/2022 1118) LTG: Use of upper extremity in functional activities: LUE as nondominant level LTG: Pt will use upper extremity in functional activity with assistance level of: Supervision/Verbal cueing   Problem: RH Tub/Shower Transfers Goal: LTG Patient will perform tub/shower transfers w/assist (OT) Description: LTG: Patient will perform tub/shower transfers with assist, with/without cues using equipment (OT) Flowsheets (Taken 11/02/2022 1118) LTG: Pt will perform tub/shower stall transfers with assistance level of: Minimal Assistance - Patient > 75%

## 2022-11-02 NOTE — Evaluation (Signed)
Occupational Therapy Assessment and Plan  Patient Details  Name: James Henderson MRN: [id number] Date of Birth: 1951-02-18  OT Diagnosis: hemiplegia affecting non-dominant side Rehab Potential: Rehab Potential (ACUTE ONLY): Good ELOS: 17-21 days   Today's Date: 11/02/2022 OT Individual Time: 1050-1205 OT Individual Time Calculation (min): 75 min     Hospital Problem: Principal Problem:   Posterior circulation stroke (HCC) Active Problems:   Stroke (cerebrum) (HCC)   Past Medical History:  Past Medical History:  Diagnosis Date   Brainstem stroke (HCC) 2015   Cerebral meningioma (HCC)    Diabetes mellitus without complication (HCC)    Glaucoma    Hyperlipidemia    Hypertension    Past Surgical History:  Past Surgical History:  Procedure Laterality Date   BIOPSY  02/15/2020   Procedure: BIOPSY;  Surgeon: Kerin Salen, MD;  Location: [facility];  Service: Gastroenterology;;   COLONOSCOPY WITH PROPOFOL N/A 02/15/2020   Procedure: COLONOSCOPY WITH PROPOFOL;  Surgeon: Kerin Salen, MD;  Location: [facility];  Service: Gastroenterology;  Laterality: N/A;   EYE SURGERY     POLYPECTOMY  02/15/2020   Procedure: POLYPECTOMY;  Surgeon: Kerin Salen, MD;  Location: [facility];  Service: Gastroenterology;;    Assessment & Plan Clinical Impression: Patient is a 72 y.o. male with medical history significant for type 2 diabetes mellitus, hypertension, hyperlipidemia, and history of CVA with residual aphasia and left-sided weakness presented to the [facility]  ED on 10/29/22 with recurrent falls total of 4 in 24 hours with  increased left-sided weakness.  Normally patient is able to ambulate with a walker.  In the ED patient had stable vitals.  EKG showed normal sinus rhythm.  CT scan of the head negative for acute abnormality.  MRI of the brain showed acute infarct in the right midbrain as well as innumerable chronic microhemorrhages throughout the brain.  Lab was notable for  creatinine of 1.7.  Neurology was consulted from the ED and patient was admitted hospital for further evaluation and treatment. .  Patient transferred to CIR on 11/01/2022 .    Patient currently requires max with basic self-care skills secondary to muscle weakness, ataxia and decreased coordination, and decreased visual acuity.  Prior to hospitalization, patient could complete BADL's with modified independent .  Patient will benefit from skilled intervention to increase independence with basic self-care skills prior to discharge home with care partner.  Anticipate patient will require 24 hour supervision and minimal physical assistance and follow up home health.  OT - End of Session Activity Tolerance: Improving Endurance Deficit: Yes OT Assessment Rehab Potential (ACUTE ONLY): Good OT Patient demonstrates impairments in the following area(s): Balance;Endurance;Motor;Safety;Vision OT Basic ADL's Functional Problem(s): Eating;Grooming;Bathing;Dressing;Toileting OT Transfers Functional Problem(s): Toilet;Tub/Shower OT Additional Impairment(s): Fuctional Use of Upper Extremity OT Plan OT Intensity: Minimum of 1-2 x/day, 45 to 90 minutes OT Frequency: 5 out of 7 days OT Duration/Estimated Length of Stay: 17-21 days OT Treatment/Interventions: Balance/vestibular training;DME/adaptive equipment instruction;Patient/family education;Therapeutic Activities;Therapeutic Exercise;Functional electrical stimulation;Community reintegration;Functional mobility training;Self Care/advanced ADL retraining;UE/LE Strength taining/ROM;UE/LE Coordination activities;Neuromuscular re-education;Discharge planning;Visual/perceptual remediation/compensation OT Self Feeding Anticipated Outcome(s): Independent with equipment OT Basic Self-Care Anticipated Outcome(s): SPV/CGA OT Toileting Anticipated Outcome(s): SPV/CGA OT Bathroom Transfers Anticipated Outcome(s): CGA for shower transfer OT Recommendation Recommendations  for Other Services: Speech consult;Neuropsych consult Patient destination: Home Follow Up Recommendations: Home health OT Equipment Recommended: 3 in 1 bedside comode;Tub/shower seat   OT Evaluation Precautions/Restrictions  Precautions Precautions: Fall Restrictions Weight Bearing Restrictions: No General Chart Reviewed: Yes Family/Caregiver Present:  No Vital Signs   Pain Pain Assessment Pain Score: 0-No pain Home Living/Prior Functioning Home Living Family/patient expects to be discharged to:: Private residence Living Arrangements: Spouse/significant other Available Help at Discharge: Family, Available 24 hours/day Home Access: Ramped entrance Home Layout: One level Bathroom Shower/Tub: Associate Professor: Yes  Lives With: Spouse IADL History Homemaking Responsibilities: No Current License: Yes Mode of Transportation: Family, Set designer Education: Patient reports having current license, but family does the driving Occupation: Retired Prior Function Level of Independence: Independent with basic ADLs Vocation: Retired Administrator, sports Baseline Vision/History: 3 Glaucoma;1 Wears glasses Ability to See in Adequate Light: 1 Impaired Patient Visual Report: Diplopia Vision Assessment?: Vision impaired- to be further tested in functional context Perception    Praxis Praxis: Impaired Praxis Impairment Details: Motor planning;Limb apraxia Cognition Cognition Overall Cognitive Status: No family/caregiver present to determine baseline cognitive functioning Arousal/Alertness: Awake/alert Memory: Impaired Memory Impairment: Retrieval deficit;Decreased recall of new information Safety/Judgment: Impaired Brief Interview for Mental Status (BIMS) Repetition of Three Words (First Attempt): 2 Temporal Orientation: Year: Correct Temporal Orientation: Month: Accurate within 5 days Temporal Orientation: Day: Correct Recall: "Sock": Yes, no cue  required Recall: "Blue": Yes, no cue required Recall: "Bed": Yes, after cueing ("a piece of furniture") BIMS Summary Score: 13 Sensation Sensation Light Touch: Appears Intact Hot/Cold: Appears Intact Proprioception: Appears Intact Stereognosis: Appears Intact Coordination Gross Motor Movements are Fluid and Coordinated: No Fine Motor Movements are Fluid and Coordinated: No Motor  Motor Motor: Hemiplegia  Trunk/Postural Assessment     Balance   Extremity/Trunk Assessment RUE Assessment RUE Assessment: Within Functional Limits LUE Assessment LUE Assessment: Exceptions to [facility] LUE Strength LUE Overall Strength: Deficits Left Shoulder Flexion: 4-/5 Left Shoulder Extension: 4-/5 Left Shoulder ABduction: 4-/5 Left Shoulder Internal Rotation: 4-/5 Left Shoulder External Rotation: 4-/5 Left Elbow Flexion: 4-/5 Left Elbow Extension: 4-/5 Left Forearm Pronation: 4-/5 Left Forearm Supination: 4-/5 Left Wrist Flexion: 4-/5 Left Wrist Extension: 4-/5 Left Wrist Radial Deviation: 4-/5 Left Wrist Ulnar Deviation: 4-/5 Left Hand Gross Grasp: Impaired  Care Tool Care Tool Self Care Eating   Eating Assist Level: Set up assist    Oral Care    Oral Care Assist Level: Set up assist    Bathing         Assist Level: Moderate Assistance - Patient 50 - 74%    Upper Body Dressing(including orthotics)   What is the patient wearing?: Hospital gown only   Assist Level: Minimal Assistance - Patient > 75%    Lower Body Dressing (excluding footwear)   What is the patient wearing?: Pants;Incontinence brief Assist for lower body dressing: Maximal Assistance - Patient 25 - 49%    Putting on/Taking off footwear   What is the patient wearing?: Socks Assist for footwear: Total Assistance - Patient < 25%       Care Tool Toileting Toileting activity   Assist for toileting: Total Assistance - Patient < 25%     Care Tool Bed Mobility Roll left and right activity   Roll left and  right assist level: Maximal Assistance - Patient 25 - 49%    Sit to lying activity   Sit to lying assist level: Maximal Assistance - Patient 25 - 49%    Lying to sitting on side of bed activity   Lying to sitting on side of bed assist level: the ability to move from lying on the back to sitting on the side of the bed with no back support.: Maximal Assistance -  Patient 25 - 49%     Care Tool Transfers Sit to stand transfer   Sit to stand assist level: Moderate Assistance - Patient 50 - 74%    Chair/bed transfer   Chair/bed transfer assist level: Moderate Assistance - Patient 50 - 74%     Toilet transfer   Assist Level: Moderate Assistance - Patient 50 - 74%     Care Tool Cognition  Expression of Ideas and Wants Expression of Ideas and Wants: 2. Frequent difficulty - frequently exhibits difficulty with expressing needs and ideas  Understanding Verbal and Non-Verbal Content Understanding Verbal and Non-Verbal Content: 3. Usually understands - understands most conversations, but misses some part/intent of message. Requires cues at times to understand   Memory/Recall Ability Memory/Recall Ability : Current season;That he or she is in a hospital/hospital unit   Refer to Care Plan for Long Term Goals  SHORT TERM GOAL WEEK 1 OT Short Term Goal 1 (Week 1): Patient to perform toilet transfer ambulating with RW with Mod A OT Short Term Goal 2 (Week 1): Patient to perform LB dressing with Mod assist OT Short Term Goal 3 (Week 1): Patient to perform shower transfer with Mod assist OT Short Term Goal 4 (Week 1): Patient to perform grooming Mod I sitting at sink  Recommendations for other services: Neuropsych   Skilled Therapeutic Intervention ADL ADL Eating: Set up;Supervision/safety Where Assessed-Eating: Bed level Grooming: Setup Where Assessed-Grooming: Sitting at sink Upper Body Bathing: Minimal assistance Where Assessed-Upper Body Bathing: Sitting at sink Lower Body Bathing:  Moderate assistance Where Assessed-Lower Body Bathing: Sitting at sink Upper Body Dressing: Minimal assistance Where Assessed-Upper Body Dressing: Chair Lower Body Dressing: Maximal assistance Where Assessed-Lower Body Dressing: Chair Toileting: Dependent Where Assessed-Toileting: Teacher, adult education: Moderate assistance Toilet Transfer Method: Proofreader: Raised toilet seat;Grab bars Tub/Shower Transfer: Not assessed Film/video editor: Not assessed Mobility  Bed Mobility Bed Mobility: Rolling Right Rolling Right: Moderate Assistance - Patient 50-74% Transfers Sit to Stand: Moderate Assistance - Patient 50-74%   Discharge Criteria: Patient will be discharged from OT if patient refuses treatment 3 consecutive times without medical reason, if treatment goals not met, if there is a change in medical status, if patient makes no progress towards goals or if patient is discharged from hospital.  The above assessment, treatment plan, treatment alternatives and goals were discussed and mutually agreed upon: by patient  Warnell Forester 11/02/2022, 12:32 PM

## 2022-11-02 NOTE — Progress Notes (Signed)
Inpatient Rehabilitation  Patient information reviewed and entered into eRehab system by [facility]. Karen Kays., CCC/SLP, PPS Coordinator.  Information including medical coding, functional ability and quality indicators will be reviewed and updated through discharge.

## 2022-11-02 NOTE — Progress Notes (Signed)
Inpatient Rehabilitation Care Coordinator Assessment and Plan Patient Details  Name: James Henderson MRN: [id number] Date of Birth: 10/07/1950  Today's Date: 11/02/2022  Hospital Problems: Principal Problem:   Posterior circulation stroke [facility]) Active Problems:   Stroke (cerebrum) [facility])  Past Medical History:  Past Medical History:  Diagnosis Date   Brainstem stroke (HCC) 2015   Cerebral meningioma (HCC)    Diabetes mellitus without complication (HCC)    Glaucoma    Hyperlipidemia    Hypertension    Past Surgical History:  Past Surgical History:  Procedure Laterality Date   BIOPSY  02/15/2020   Procedure: BIOPSY;  Surgeon: Kerin Salen, MD;  Location: [facility];  Service: Gastroenterology;;   COLONOSCOPY WITH PROPOFOL N/A 02/15/2020   Procedure: COLONOSCOPY WITH PROPOFOL;  Surgeon: Kerin Salen, MD;  Location: [facility];  Service: Gastroenterology;  Laterality: N/A;   EYE SURGERY     POLYPECTOMY  02/15/2020   Procedure: POLYPECTOMY;  Surgeon: Kerin Salen, MD;  Location: [facility];  Service: Gastroenterology;;   Social History:  reports that he quit smoking about 24 years ago. He has never used smokeless tobacco. He reports that he does not drink alcohol and does not use drugs.  Family / Support Systems Marital Status: Married How Long?: 20 years Patient Roles: Spouse Spouse/Significant Other: [name] (wife) Children: Seward Grater (son) Other Supports: None reported Anticipated Caregiver: Son Ability/Limitations of Caregiver: Pt wife is primary caregiver, however, she works during the day so their son stays home with pt Caregiver Availability: 24/7 Family Dynamics: pt lives with his wife and son/  Social History Preferred language: English Religion: Catholic Cultural Background: Unsure on pt previous occupation. Pt currently attends [employer]. 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 Electronics engineer: Denies   Abuse/Neglect Abuse/Neglect Assessment Can Be Completed: Unable to assess, patient is non-responsive or altered mental status Physical Abuse: Denies Verbal Abuse: Denies Sexual Abuse: Denies Exploitation of patient/patient's resources: Denies Self-Neglect: Denies  Patient response to: Social Isolation - How often do you feel lonely or isolated from those around you?: Patient unable to respond  Emotional Status Pt's affect, behavior and adjustment status: Pt presents as pleasant, however, cognitive concerns as he either needs more time to answer SW questions or does not answer questions correctly. Recent Psychosocial Issues: Denies Psychiatric History: Denies Substance Abuse History: Reports he quit using tobacco products 20+ yrs ago. Denies etoh and rec drug use.  Patient / Family Perceptions, Expectations & Goals Pt/Family understanding of illness & functional limitations: Per EMR, pt family has a general understanding of pt care needs Premorbid pt/family roles/activities: Independent physicalls; cognition-dependent per EMR Anticipated changes in roles/activities/participation: Continued support with ADLs/IADLs Pt/family expectations/goals: Pt would like to be abel to work on everything that allows him to be independent.  Community Resources [employer]: None Premorbid Home Care/DME Agencies: None Transportation available at discharge: TBD Is the patient able to respond to transportation needs?: Yes In the past 12 months, has lack of transportation kept you from medical appointments or from getting medications?: No In the past 12 months, has lack of transportation kept you from meetings, work, or from getting things needed for daily living?: No Resource  referrals recommended: Neuropsychology  Discharge Planning Living Arrangements: Spouse/significant other, Children Support Systems: Spouse/significant other,  Children Type of Residence: Private residence Insurance Resources: Media planner (specify) ([employer]) Surveyor, quantity Resources: Restaurant manager, fast food Screen Referred: No Living Expenses: Lives with family Money Management: Family Does the patient have any problems obtaining your medications?: No Home Management: Pt wife manages home care needs Patient/Family Preliminary Plans: TBD Care Coordinator Barriers to Discharge: Decreased caregiver support, Lack of/limited family support, Community education officer for SNF coverage Care Coordinator Anticipated Follow Up Needs: HH/OP  Clinical Impression This SW covering for primary SW, Becky Dupree.   SW met with pt in room to introduce self, explain role, and discuss discharge process. SW used some collateral reports to complete assessment due to cognition. Pt is not a Cytogeneticist. No HCPOA. DME: RW.  [id number] left message for pt wife [name] introducing self, explained role, discussed discharge process, and informed primary SW will follow-up with updates.   Jowan Skillin A Raden Byington 11/02/2022, 1:21 PM

## 2022-11-03 DIAGNOSIS — I6350 Cerebral infarction due to unspecified occlusion or stenosis of unspecified cerebral artery: Secondary | ICD-10-CM | POA: Diagnosis not present

## 2022-11-03 LAB — GLUCOSE, CAPILLARY
Glucose-Capillary: 116 mg/dL — ABNORMAL HIGH (ref 70–99)
Glucose-Capillary: 175 mg/dL — ABNORMAL HIGH (ref 70–99)
Glucose-Capillary: 124 mg/dL — ABNORMAL HIGH (ref 70–99)
Glucose-Capillary: 114 mg/dL — ABNORMAL HIGH (ref 70–99)

## 2022-11-03 MED ORDER — VITAMIN D 25 MCG (1000 UNIT) PO TABS
1000.0000 [IU] | ORAL_TABLET | Freq: Every day | ORAL | Status: DC
  Administered 2022-11-03 – 2022-11-13 (×11): 1000 [IU] via ORAL
  Filled 2022-11-03 (×11): qty 1

## 2022-11-03 NOTE — Progress Notes (Signed)
Physical Therapy Session Note  Patient Details  Name: James Henderson MRN: [id number] Date of Birth: 02-Jul-1950  Today's Date: 11/03/2022 PT Individual Time: 1445-1525 PT Individual Time Calculation (min): 40 min   Short Term Goals: Week 1:  PT Short Term Goal 1 (Week 1): Pt will navigate 6" stairs with assist PT Short Term Goal 2 (Week 1): Pt will perform STS with min A consistently PT Short Term Goal 3 (Week 1): Pt will ambulate 100 ft with assist  Skilled Therapeutic Interventions/Progress Updates:  Pt was seen bedside in the pm. Pt performed multiple sit to stand and stand pivot transfers with rolling walker and min A with verbal cues. Pt performed toilet transfers with min A and verbal cues. Pt ambulated about 120 feet with rolling walker and min A with verbal cues. Pt performed LE exercises seated in w/c, 3 sets x 10 reps each, 4 lbs, hip flex and LAQs. Pt returned to room following treatment and left sitting up in w/c with chair alarm in place and call bell within reach.   Therapy Documentation Precautions:  Precautions Precautions: Fall Precaution Comments: L hemi Restrictions Weight Bearing Restrictions: No General:   Pain: No c/o pain.   Therapy/Group: Individual Therapy  [name] 11/03/2022, 3:39 PM

## 2022-11-03 NOTE — Progress Notes (Signed)
Occupational Therapy Session Note  Patient Details  Name: James Henderson MRN: [id number] Date of Birth: May 27, 1950  Today's Date: 11/03/2022 OT Individual Time: 4098-1191 OT Individual Time Calculation (min): 67 min    Short Term Goals: Week 1:  OT Short Term Goal 1 (Week 1): Patient to perform toilet transfer ambulating with RW with Mod A OT Short Term Goal 2 (Week 1): Patient to perform LB dressing with Mod assist OT Short Term Goal 3 (Week 1): Patient to perform shower transfer with Mod assist OT Short Term Goal 4 (Week 1): Patient to perform grooming Mod I sitting at sink  Skilled Therapeutic Interventions/Progress Updates: Patient approached for OT services and communicated that he did not feel well but concurred to bathe and dress in bed only.  Overall he required moderate assistance due to L UE and LE hemiplegia.  Patient was able to complete bed mobility with CGA to assist with doffing saturated brief and to don the new one.   He was able to wash front periarea.   Therapist washed backperiarea.   Patient was left at end of session supine with HOB elevated with nurse tech setting him up to eat his breakfast.  Patient will benefit from continued OT services to address multiple deficits and to address goals towards independence with self care and functional activities.  Continue patient POC.     Therapy Documentation Precautions:  Precautions Precautions: Fall Precaution Comments: L hemi Restrictions Weight Bearing Restrictions: No General: General OT Amount of Missed Time: 8 Minutes patient wanted to stop to eat breakfast.  Nurse tech assisted him with breakfast  Pain:denied     Therapy/Group: Individual Therapy  Bud Face [facility] 11/03/2022, 3:38 PM

## 2022-11-03 NOTE — Progress Notes (Signed)
Physical Therapy Session Note  Patient Details  Name: Lauro Tenorio MRN: [id number] Date of Birth: 12-31-1950  Today's Date: 11/03/2022 PT Individual Time: 1300-1343 PT Individual Time Calculation (min): 43 min   Short Term Goals: Week 1:  PT Short Term Goal 1 (Week 1): Pt will navigate 6" stairs with assist PT Short Term Goal 2 (Week 1): Pt will perform STS with min A consistently PT Short Term Goal 3 (Week 1): Pt will ambulate 100 ft with assist  Skilled Therapeutic Interventions/Progress Updates:  Pt was seen bedside in the pm. Pt transported to rehab gym. Pt transferred sit to stand multiple times with rolling walker and min to mod A with verbal cues. Pt ambulated 55 feet x 2 and 95 feet with rolling walker and min to mod A with verbal cues. Pt performed step taps alternating 3 sets x 10 reps each for NMR. Pt returned to room following treatment and left sitting up in w/c with chair alarm in place and call bell within reach.   Therapy Documentation Precautions:  Precautions Precautions: Fall Precaution Comments: L hemi Restrictions Weight Bearing Restrictions: No General:    Pain: No c/o pain..    Therapy/Group: Individual Therapy  [name] 11/03/2022, 3:34 PM

## 2022-11-03 NOTE — Progress Notes (Signed)
Physical Therapy Session Note  Patient Details  Name: James Henderson MRN: [id number] Date of Birth: 29-Aug-1950  Today's Date: 11/03/2022 PT Individual Time: 0917-1000 PT Individual Time Calculation (min): 43 min   Short Term Goals: Week 1:  PT Short Term Goal 1 (Week 1): Pt will navigate 6" stairs with assist PT Short Term Goal 2 (Week 1): Pt will perform STS with min A consistently PT Short Term Goal 3 (Week 1): Pt will ambulate 100 ft with assist  Skilled Therapeutic Interventions/Progress Updates: Py presents supine in bed and agreeable to therapy.  Pt transfers sup to sit w/ min A.  Pt required mod A w/ cueing for SPT bed > w/c.  Pt wheeled to dayroom for time conservation.  Pt transfers sit to stand w/ mod A and cues for sequencing, esp. forward lean.  Pt amb w/ RW and mod A x 40', including turn to return to seat.  Pt requires verbal cues for turns maintaining RW and BOS.  Pt performed toe taps to small cone w/ RW and min to mod A, verbal cues for maintaining BOS.  Pt states need for BR.  Pt returned to room and amb w/ mod A into BR, constant cues for walker management and manual assist up ramped entrance.  Pt required total A for clothing management, incontinent of bowel in brief.  Pt continent of bowel and bladder in toilet, NT notified and charted in Flowsheets.  Pt assists w/ pericare but PT completed.  New brief donned and PT managed clothing.  Pt amb to w/c w/ RW.  Pt remained sitting in w/c w/ chair alarm on and all needs in reach.     Therapy Documentation Precautions:  Precautions Precautions: Fall Precaution Comments: L hemi Restrictions Weight Bearing Restrictions: No General:   Vital Signs:   Pain:0/10      Therapy/Group: Individual Therapy  Lucio Edward 11/03/2022, 10:06 AM

## 2022-11-03 NOTE — Progress Notes (Signed)
PROGRESS NOTE   Subjective Complaints: No new complaints this morning Working with therapy in gym Chart reviewed Tolerating therapy well  ROS: denies pain  Objective:   No results found. Recent Labs    11/01/22 0645 11/02/22 1615  WBC 5.0 3.6*  HGB 11.8* 12.2*  HCT 36.8* 38.6*  PLT 226 217   Recent Labs    11/01/22 0645 11/02/22 1615  NA 139 140  K 3.8 4.7  CL 107 107  CO2 23 24  GLUCOSE 126* 90  BUN 17 21  CREATININE 1.40* 1.47*  CALCIUM 8.7* 8.8*    Intake/Output Summary (Last 24 hours) at 11/03/2022 1325 Last data filed at 11/03/2022 1215 Gross per 24 hour  Intake 480 ml  Output 300 ml  Net 180 ml        Physical Exam: Vital Signs Blood pressure 129/89, pulse 66, temperature (!) 97.5 F (36.4 C), temperature source Oral, resp. rate 18, height 5\' 6"  (1.676 m), weight 74.1 kg, SpO2 100%. Gen: no distress, normal appearing, BMI 26.37    General:        Right eye: No discharge.        Left eye: No discharge.     Extraocular Movements: Extraocular movements intact.     Left eye: Abnormal extraocular motion present.  Cardiovascular:     Rate and Rhythm: Normal rate. Rhythm irregular.     Heart sounds: Normal heart sounds. No murmur heard.    No gallop.  Pulmonary:     Effort: Pulmonary effort is normal. No respiratory distress.     Breath sounds: Normal breath sounds. No wheezing, rhonchi or rales.  Abdominal:     General: Bowel sounds are normal. There is no distension.     Palpations: Abdomen is soft.     Tenderness: There is no abdominal tenderness.  Musculoskeletal:     Cervical back: Neck supple. No tenderness.     Comments: RUE 5-/5 RLE 5-/5 LUE posturing 4+/5 in biceps, triceps, WE, grip and FA LLE HF 4/5; KE/DF and PF 5-/5  Neurological:     Mental Status: He is alert and oriented to person, place, and time.     Comments: Dysarthric and ataxic speech. Sitting at EOB with good  balance. He was able to answer orientation questions and able to follow simple one step commands.  Very dysarthric- hard to understand No clonus; no hoffman's no increased tone B/L Intact to light touch in all  4 extremities  Psychiatric:     Comments: flat    Assessment/Plan: 1. Functional deficits which require 3+ hours per day of interdisciplinary therapy in a comprehensive inpatient rehab setting. Physiatrist is providing close team supervision and 24 hour management of active medical problems listed below. Physiatrist and rehab team continue to assess barriers to discharge/monitor patient progress toward functional and medical goals  Care Tool:  Bathing              Bathing assist Assist Level: Moderate Assistance - Patient 50 - 74%     Upper Body Dressing/Undressing Upper body dressing   What is the patient wearing?: Hospital gown only    Upper body assist Assist Level: Minimal  Assistance - Patient > 75%    Lower Body Dressing/Undressing Lower body dressing      What is the patient wearing?: Pants, Incontinence brief     Lower body assist Assist for lower body dressing: Maximal Assistance - Patient 25 - 49%     Toileting Toileting    Toileting assist Assist for toileting: Total Assistance - Patient < 25%     Transfers Chair/bed transfer  Transfers assist     Chair/bed transfer assist level: Moderate Assistance - Patient 50 - 74%     Locomotion Ambulation   Ambulation assist      Assist level: Moderate Assistance - Patient 50 - 74% Assistive device: Walker-rolling Max distance: 40   Walk 10 feet activity   Assist     Assist level: Moderate Assistance - Patient - 50 - 74% Assistive device: Walker-rolling   Walk 50 feet activity   Assist    Assist level: Moderate Assistance - Patient - 50 - 74% Assistive device: Walker-rolling    Walk 150 feet activity   Assist Walk 150 feet activity did not occur: Safety/medical concerns          Walk 10 feet on uneven surface  activity   Assist Walk 10 feet on uneven surfaces activity did not occur: Safety/medical concerns         Wheelchair     Assist Is the patient using a wheelchair?: Yes Type of Wheelchair: Manual    Wheelchair assist level: Moderate Assistance - Patient 50 - 74% Max wheelchair distance: 100 ft    Wheelchair 50 feet with 2 turns activity    Assist        Assist Level: Moderate Assistance - Patient 50 - 74%   Wheelchair 150 feet activity     Assist      Assist Level: Maximal Assistance - Patient 25 - 49%   Blood pressure 129/89, pulse 66, temperature (!) 97.5 F (36.4 C), temperature source Oral, resp. rate 18, height 5\' 6"  (1.676 m), weight 74.1 kg, SpO2 100%.  Medical Problem List and Plan: 1. 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  Antithrombotics: -DVT/anticoagulation:  Pharmaceutical: Lovenox             -antiplatelet therapy: ASA/Brilinta X 4 weeks followed by ASA alone.  3. Pain Management:  Tylenol prn.  4. Mood/Behavior/Sleep: LCSW to follow for evaluation and support.              -antipsychotic agents: N/A 5. Neuropsych/cognition: This patient is capable of making decisions on his own behalf. 6. Skin/Wound Care: Routine pressure relief measures 7. Fluids/Electrolytes/Nutrition: Monitor I/O. Check CMET in am.  8. HTN: Monitor BP TID. Continue Amlodipine. Continue to hold Losartan.  9. T2DM: Hgb A1c- 7.2--on glipizide and metformin PTA             --will resume metformin and monitor for need to resume glipizide             --monitor BS as/hs and use SSI for elevated BS.  10. Acute on chronic renal failure: SCr 1.7 at admission-->baseline likely 1.3-1.4 range             --improved with IVF for hydration and now back  to baseline.              --avoid nephrotoxic drugs. Recheck BUN/SCr  in am  11. Dementia: Continue Namenda and Aricept. Delirium precautions.   12. Low HDL:  LDL-58 and at goal. Continue Crestor and Zetia.  13. Hyperactive bladder: Continue Tiovaz and Mirabegron.    14. Suboptimal vitamin D level: start daily D3 supplement  15. Overweight: provide dietary education    LOS: 2 days A FACE TO FACE EVALUATION WAS PERFORMED  Raizy Auzenne P Kathalene Sporer 11/03/2022, 1:25 PM

## 2022-11-04 DIAGNOSIS — I6350 Cerebral infarction due to unspecified occlusion or stenosis of unspecified cerebral artery: Secondary | ICD-10-CM | POA: Diagnosis not present

## 2022-11-04 LAB — GLUCOSE, CAPILLARY
Glucose-Capillary: 106 mg/dL — ABNORMAL HIGH (ref 70–99)
Glucose-Capillary: 96 mg/dL (ref 70–99)
Glucose-Capillary: 113 mg/dL — ABNORMAL HIGH (ref 70–99)
Glucose-Capillary: 131 mg/dL — ABNORMAL HIGH (ref 70–99)

## 2022-11-04 NOTE — Progress Notes (Signed)
PROGRESS NOTE   Subjective Complaints: No new complaints this morning other than frustration that no one has been able to help him wash at sink, discussed with nursing aide and they are about to help him  ROS: denies pain  Objective:   No results found. Recent Labs    11/02/22 1615  WBC 3.6*  HGB 12.2*  HCT 38.6*  PLT 217   Recent Labs    11/02/22 1615  NA 140  K 4.7  CL 107  CO2 24  GLUCOSE 90  BUN 21  CREATININE 1.47*  CALCIUM 8.8*    Intake/Output Summary (Last 24 hours) at 11/04/2022 1144 Last data filed at 11/04/2022 0409 Gross per 24 hour  Intake 474 ml  Output 300 ml  Net 174 ml        Physical Exam: Vital Signs Blood pressure 117/62, pulse 70, temperature 97.8 F (36.6 C), temperature source Oral, resp. rate 18, height 5\' 6"  (1.676 m), weight 74.1 kg, SpO2 99%. Gen: no distress, normal appearing, BMI 26.37    General:        Right eye: No discharge.        Left eye: No discharge.     Extraocular Movements: Extraocular movements intact.     Left eye: Abnormal extraocular motion present.  Cardiovascular:     Rate and Rhythm: Normal rate. Rhythm irregular.     Heart sounds: Normal heart sounds. No murmur heard.    No gallop.  Pulmonary:     Effort: Pulmonary effort is normal. No respiratory distress.     Breath sounds: Normal breath sounds. No wheezing, rhonchi or rales.  Abdominal:     General: Bowel sounds are normal. There is no distension.     Palpations: Abdomen is soft.     Tenderness: There is no abdominal tenderness.  Musculoskeletal:     Cervical back: Neck supple. No tenderness.     Comments: RUE 5-/5 RLE 5-/5 LUE posturing 4+/5 in biceps, triceps, WE, grip and FA LLE HF 4/5; KE/DF and PF 5-/5  Neurological:     Mental Status: He is alert and oriented to person, place, and time.     Comments: Dysarthric and ataxic speech. Sitting at EOB with good balance. He was able to answer  orientation questions and able to follow simple one step commands.  Very dysarthric- hard to understand No clonus; no hoffman's no increased tone B/L Intact to light touch in all  4 extremities  Transferring MinA Psychiatric:     Comments: flat    Assessment/Plan: 1. Functional deficits which require 3+ hours per day of interdisciplinary therapy in a comprehensive inpatient rehab setting. Physiatrist is providing close team supervision and 24 hour management of active medical problems listed below. Physiatrist and rehab team continue to assess barriers to discharge/monitor patient progress toward functional and medical goals  Care Tool:  Bathing              Bathing assist Assist Level: Moderate Assistance - Patient 50 - 74%     Upper Body Dressing/Undressing Upper body dressing   What is the patient wearing?: Hospital gown only    Upper body assist Assist Level:  Minimal Assistance - Patient > 75%    Lower Body Dressing/Undressing Lower body dressing      What is the patient wearing?: Pants, Incontinence brief     Lower body assist Assist for lower body dressing: Maximal Assistance - Patient 25 - 49%     Toileting Toileting    Toileting assist Assist for toileting: Total Assistance - Patient < 25%     Transfers Chair/bed transfer  Transfers assist     Chair/bed transfer assist level: Moderate Assistance - Patient 50 - 74%     Locomotion Ambulation   Ambulation assist      Assist level: Minimal Assistance - Patient > 75% Assistive device: Walker-rolling Max distance: 120   Walk 10 feet activity   Assist     Assist level: Minimal Assistance - Patient > 75% Assistive device: Walker-rolling   Walk 50 feet activity   Assist    Assist level: Minimal Assistance - Patient > 75% Assistive device: Walker-rolling    Walk 150 feet activity   Assist Walk 150 feet activity did not occur: Safety/medical concerns         Walk 10 feet on  uneven surface  activity   Assist Walk 10 feet on uneven surfaces activity did not occur: Safety/medical concerns         Wheelchair     Assist Is the patient using a wheelchair?: Yes Type of Wheelchair: Manual    Wheelchair assist level: Moderate Assistance - Patient 50 - 74% Max wheelchair distance: 100 ft    Wheelchair 50 feet with 2 turns activity    Assist        Assist Level: Moderate Assistance - Patient 50 - 74%   Wheelchair 150 feet activity     Assist      Assist Level: Maximal Assistance - Patient 25 - 49%   Blood pressure 117/62, pulse 70, temperature 97.8 F (36.6 C), temperature source Oral, resp. rate 18, height 5\' 6"  (1.676 m), weight 74.1 kg, SpO2 99%.  Medical Problem List and Plan: 1. 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  Antithrombotics: -DVT/anticoagulation:  Pharmaceutical: Lovenox             -antiplatelet therapy: ASA/Brilinta X 4 weeks followed by ASA alone.  3. Pain Management:  Tylenol prn.  4. Mood/Behavior/Sleep: LCSW to follow for evaluation and support.              -antipsychotic agents: N/A 5. Neuropsych/cognition: This patient is capable of making decisions on his own behalf. 6. Skin/Wound Care: Routine pressure relief measures 7. Fluids/Electrolytes/Nutrition: Monitor I/O. Check CMET in am.  8. HTN: Monitor BP TID. Continue Amlodipine. Continue to hold Losartan.  9. T2DM: Hgb A1c- 7.2--on glipizide and metformin PTA             --will resume metformin and monitor for need to resume glipizide, will hold off on increasing metformin further given AKI on CKD.              --monitor BS as/hs and use SSI for elevated BS.  10. Acute on chronic renal failure: SCr 1.7 at admission-->baseline likely 1.3-1.4 range             --improved  with IVF for hydration and now back to baseline.              --avoid  nephrotoxic drugs. Recheck BUN/SCr in am  11. Dementia: Continue Namenda and Aricept. Delirium precautions.   12. Low HDL:  LDL-58 and at goal. Continue Crestor and Zetia. Continue therapy  13. Hyperactive bladder: continue Tiovaz and Mirabegron.    14. Suboptimal vitamin D level: start daily D3 supplement  15. Overweight: provide dietary education    LOS: 3 days A FACE TO FACE EVALUATION WAS PERFORMED  Drema Pry Pattricia Weiher 11/04/2022, 11:44 AM

## 2022-11-04 NOTE — IPOC Note (Signed)
Overall Plan of Care [facility]) Patient Details Name: James Henderson MRN: [id number] DOB: 09-18-1950  Admitting Diagnosis: Posterior circulation stroke [facility])  Hospital Problems: Principal Problem:   Posterior circulation stroke (HCC) Active Problems:   Stroke (cerebrum) (HCC)     Functional Problem List: Nursing Bowel, Bladder, Safety, Endurance, Medication Management  PT Balance, Safety, Endurance, Motor, Skin Integrity, Sensory  OT Balance, Endurance, Motor, Safety, Vision  SLP Cognition, Linguistic, Motor, Endurance, Sensory  TR         Basic ADL's: OT Eating, Grooming, Bathing, Dressing, Toileting     Advanced  ADL's: OT       Transfers: PT Bed Mobility, Bed to Chair, Customer service manager, Tub/Shower     Locomotion: PT Ambulation, Psychologist, prison and probation services, Stairs     Additional Impairments: OT Fuctional Use of Upper Extremity  SLP Communication comprehension, expression    TR      Anticipated Outcomes Item Anticipated Outcome  Self Feeding Independent with equipment  Swallowing      Basic self-care  SPV/CGA  Toileting  SPV/CGA   Bathroom Transfers CGA for shower transfer  Bowel/Bladder  manage bowel w toileting and bladder w mod I assist  Transfers  CGA transfers with LRAD  Locomotion  CGA gait with LRAD  Communication     Cognition     Pain  n/a  Safety/Judgment  manage w cues   Therapy Plan: PT Intensity: Minimum of 1-2 x/day ,45 to 90 minutes PT Frequency: 5 out of 7 days PT Duration Estimated Length of Stay: 17-21 days OT Intensity: Minimum of 1-2 x/day, 45 to 90 minutes OT Frequency: 5 out of 7 days OT Duration/Estimated Length of Stay: 17-21 days     Team Interventions: Nursing Interventions Patient/Family Education, Medication Management, Bladder Management, Discharge Planning, Disease Management/Prevention, Bowel Management  PT interventions Ambulation/gait training, Cognitive remediation/compensation, Discharge planning, DME/adaptive  equipment instruction, Functional mobility training, Pain management, Psychosocial support, Splinting/orthotics, Therapeutic Activities, UE/LE Strength taining/ROM, Visual/perceptual remediation/compensation, Wheelchair propulsion/positioning, UE/LE Coordination activities, Stair training, Skin care/wound management, Therapeutic Exercise, Patient/family education, Neuromuscular re-education, Functional electrical stimulation, Disease management/prevention, Warden/ranger, Community reintegration  OT Interventions Warden/ranger, Fish farm manager, Patient/family education, Therapeutic Activities, Therapeutic Exercise, Functional electrical stimulation, Community reintegration, Functional mobility training, Self Care/advanced ADL retraining, UE/LE Strength taining/ROM, UE/LE Coordination activities, Neuromuscular re-education, Discharge planning, Visual/perceptual remediation/compensation  SLP Interventions    TR Interventions    SW/CM Interventions Discharge Planning, Psychosocial Support, Patient/Family Education   Barriers to Discharge MD  Medical stability  Nursing Decreased caregiver support 1 level ramped entry w wife; has a SPC and rollator;reports he ambulates with rollator without physical assistance, notes 4 falls in the day prior to admission  PT Inaccessible home environment, Decreased caregiver support    OT      SLP      SW Decreased caregiver support, Lack of/limited family support, Community education officer for SNF coverage     Team Discharge Planning: Destination: PT-Home ,OT- Home , SLP-Home Projected Follow-up: PT-Home health PT, OT-  Home health OT, SLP-24 hour supervision/assistance Projected Equipment Needs: PT-To be determined, OT- 3 in 1 bedside comode, Tub/shower seat, SLP-  Equipment Details: PT- , OT-  Patient/family involved in discharge planning: PT- Patient,  OT-Patient, SLP-Family member/caregiver  MD ELOS: 7-10 days Medical Rehab  Prognosis:  Excellent Assessment: The patient has been admitted for CIR therapies with the diagnosis of right midbrain stroke. The team will be addressing functional mobility, strength, stamina, balance, safety, adaptive techniques and equipment, self-care, bowel and  bladder mgt, patient and caregiver education. Goals have been set at supervision. Anticipated discharge destination is home.        See Team Conference Notes for weekly updates to the plan of care

## 2022-11-04 NOTE — Plan of Care (Signed)
  Problem: RH BLADDER ELIMINATION Goal: RH STG MANAGE BLADDER WITH ASSISTANCE Description: STG Manage Bladder With mod I Assistance Outcome: Progressing   Problem: RH SAFETY Goal: RH STG ADHERE TO SAFETY PRECAUTIONS W/ASSISTANCE/DEVICE Description: STG Adhere to Safety Precautions With cues Assistance/Device. Outcome: Progressing   Problem: RH KNOWLEDGE DEFICIT Goal: RH STG INCREASE KNOWLEDGE OF DIABETES Description: Patient and wife will be able to manage DM with medications and dietary modifications using educational resources independently Outcome: Progressing Goal: RH STG INCREASE KNOWLEDGE OF STROKE PROPHYLAXIS Description: Patient and wife will be able to manage secondary risks with medications and dietary modifications using educational resources independently Outcome: Progressing

## 2022-11-05 DIAGNOSIS — I69322 Dysarthria following cerebral infarction: Secondary | ICD-10-CM

## 2022-11-05 DIAGNOSIS — N179 Acute kidney failure, unspecified: Secondary | ICD-10-CM

## 2022-11-05 DIAGNOSIS — E1121 Type 2 diabetes mellitus with diabetic nephropathy: Secondary | ICD-10-CM

## 2022-11-05 DIAGNOSIS — Z794 Long term (current) use of insulin: Secondary | ICD-10-CM

## 2022-11-05 DIAGNOSIS — N1831 Chronic kidney disease, stage 3a: Secondary | ICD-10-CM

## 2022-11-05 DIAGNOSIS — I6350 Cerebral infarction due to unspecified occlusion or stenosis of unspecified cerebral artery: Secondary | ICD-10-CM | POA: Diagnosis not present

## 2022-11-05 LAB — CBC
HCT: 39.5 % (ref 39.0–52.0)
Hemoglobin: 12.5 g/dL — ABNORMAL LOW (ref 13.0–17.0)
MCH: 29.1 pg (ref 26.0–34.0)
MCHC: 31.6 g/dL (ref 30.0–36.0)
MCV: 92.1 fL (ref 80.0–100.0)
Platelets: 243 10*3/uL (ref 150–400)
RBC: 4.29 MIL/uL (ref 4.22–5.81)
RDW: 12.9 % (ref 11.5–15.5)
WBC: 4 10*3/uL (ref 4.0–10.5)
nRBC: 0 % (ref 0.0–0.2)

## 2022-11-05 LAB — BASIC METABOLIC PANEL
Anion gap: 8 (ref 5–15)
BUN: 26 mg/dL — ABNORMAL HIGH (ref 8–23)
CO2: 25 mmol/L (ref 22–32)
Calcium: 9 mg/dL (ref 8.9–10.3)
Chloride: 102 mmol/L (ref 98–111)
Creatinine, Ser: 1.84 mg/dL — ABNORMAL HIGH (ref 0.61–1.24)
GFR, Estimated: 38 mL/min — ABNORMAL LOW (ref >60)
Glucose, Bld: 127 mg/dL — ABNORMAL HIGH (ref 70–99)
Potassium: 4.7 mmol/L (ref 3.5–5.1)
Sodium: 135 mmol/L (ref 135–145)

## 2022-11-05 LAB — GLUCOSE, CAPILLARY
Glucose-Capillary: 119 mg/dL — ABNORMAL HIGH (ref 70–99)
Glucose-Capillary: 144 mg/dL — ABNORMAL HIGH (ref 70–99)
Glucose-Capillary: 116 mg/dL — ABNORMAL HIGH (ref 70–99)
Glucose-Capillary: 104 mg/dL — ABNORMAL HIGH (ref 70–99)

## 2022-11-05 NOTE — Progress Notes (Signed)
Physical Therapy Session Note  Patient Details  Name: Kyzier Isgro MRN: [id number] Date of Birth: [birth year]  Today's Date: 11/05/2022 PT Individual Time: 1400-1430 PT Individual Time Calculation (min): 30 min   Short Term Goals: Week 1:  PT Short Term Goal 1 (Week 1): Pt will navigate 6" stairs with assist PT Short Term Goal 2 (Week 1): Pt will perform STS with min A consistently PT Short Term Goal 3 (Week 1): Pt will ambulate 100 ft with assist  Skilled Therapeutic Interventions/Progress Updates:    Session focused on functional gait training with RW (pt reports using RW PTA) with focus on increasing step length, trunk rotation, upright posture, and safety with RW x 110' and then again at end of session x 50'. Very slow gait speed noted and cues for safe positioning of RW during turns. Nustep for NMR for reciprocal movement pattern retraining with BUE and BLE on level 6 x 5 min for carryover to gait globally. Pt performed sit <> stands with RW with min to CGA with cues for technique and facilitation for anterior weightshift.   Therapy Documentation Precautions:  Precautions Precautions: Fall Precaution Comments: L hemi Restrictions Weight Bearing Restrictions: No  Pain: Denies pain.   Therapy/Group: Individual Therapy  Karolee Stamps Darrol Poke, PT, DPT, CBIS  11/05/2022, 3:08 PM

## 2022-11-05 NOTE — Progress Notes (Signed)
PROGRESS NOTE   Subjective Complaints:  Pt severely dysarthric, he is able to feed himself, discussed blood work Discussed need to hold DM medication (metformin)  Pt still feels weakner on Left side  ROS: denies CP, SOB, N/V/D  Objective:   No results found. Recent Labs    11/02/22 1615 11/05/22 0614  WBC 3.6* 4.0  HGB 12.2* 12.5*  HCT 38.6* 39.5  PLT 217 243   Recent Labs    11/02/22 1615 11/05/22 0614  NA 140 135  K 4.7 4.7  CL 107 102  CO2 24 25  GLUCOSE 90 127*  BUN 21 26*  CREATININE 1.47* 1.84*  CALCIUM 8.8* 9.0    Intake/Output Summary (Last 24 hours) at 11/05/2022 1224 Last data filed at 11/05/2022 0900 Gross per 24 hour  Intake 600 ml  Output 125 ml  Net 475 ml        Physical Exam: Vital Signs Blood pressure 119/67, pulse 65, temperature 97.6 F (36.4 C), temperature source Oral, resp. rate 18, height 5\' 6"  (1.676 m), weight 71.1 kg, SpO2 99%. Gen: no distress, normal appearing, BMI 26.37  General: No acute distress Mood and affect are appropriate Heart: Regular rate and rhythm no rubs murmurs or extra sounds Lungs: Clear to auscultation, breathing unlabored, no rales or wheezes Abdomen: Positive bowel sounds, soft nontender to palpation, nondistended Extremities: No clubbing, cyanosis, or edema Skin: No evidence of breakdown, no evidence of rash   Musculoskeletal:     Cervical back: Neck supple. No tenderness.     Comments: RUE 5-/5 RLE 5-/5 LUE posturing 4+/5 in biceps, triceps, WE, grip and FA LLE HF 4/5; KE/DF and PF 5-/5  Neurological:     Mental Status: He is alert and oriented to person, place, and time.     Comments: Dysarthric and ataxic speech. Sitting at EOB with good balance. He was able to answer orientation questions and able to follow simple one step commands.  Very dysarthric- hard to understand No clonus; no hoffman's no increased tone B/L Intact to light touch in  all  4 extremities  Transferring MinA Psychiatric:     Comments: flat    Assessment/Plan: 1. Functional deficits which require 3+ hours per day of interdisciplinary therapy in a comprehensive inpatient rehab setting. Physiatrist is providing close team supervision and 24 hour management of active medical problems listed below. Physiatrist and rehab team continue to assess barriers to discharge/monitor patient progress toward functional and medical goals  Care Tool:  Bathing              Bathing assist Assist Level: Moderate Assistance - Patient 50 - 74%     Upper Body Dressing/Undressing Upper body dressing   What is the patient wearing?: Hospital gown only    Upper body assist Assist Level: Minimal Assistance - Patient > 75%    Lower Body Dressing/Undressing Lower body dressing      What is the patient wearing?: Pants, Incontinence brief     Lower body assist Assist for lower body dressing: Maximal Assistance - Patient 25 - 49%     Toileting Toileting    Toileting assist Assist for toileting: Independent with assistive device  Transfers Chair/bed transfer  Transfers assist     Chair/bed transfer assist level: Moderate Assistance - Patient 50 - 74%     Locomotion Ambulation   Ambulation assist      Assist level: Minimal Assistance - Patient > 75% Assistive device: Walker-rolling Max distance: 120   Walk 10 feet activity   Assist     Assist level: Minimal Assistance - Patient > 75% Assistive device: Walker-rolling   Walk 50 feet activity   Assist    Assist level: Minimal Assistance - Patient > 75% Assistive device: Walker-rolling    Walk 150 feet activity   Assist Walk 150 feet activity did not occur: Safety/medical concerns         Walk 10 feet on uneven surface  activity   Assist Walk 10 feet on uneven surfaces activity did not occur: Safety/medical concerns         Wheelchair     Assist Is the patient  using a wheelchair?: Yes Type of Wheelchair: Manual    Wheelchair assist level: Moderate Assistance - Patient 50 - 74% Max wheelchair distance: 100 ft    Wheelchair 50 feet with 2 turns activity    Assist        Assist Level: Moderate Assistance - Patient 50 - 74%   Wheelchair 150 feet activity     Assist      Assist Level: Maximal Assistance - Patient 25 - 49%   Blood pressure 119/67, pulse 65, temperature 97.6 F (36.4 C), temperature source Oral, resp. rate 18, height 5\' 6"  (1.676 m), weight 71.1 kg, SpO2 99%.  Medical Problem List and Plan: 1. 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  Antithrombotics: -DVT/anticoagulation:  Pharmaceutical: Lovenox             -antiplatelet therapy: ASA/Brilinta X 4 weeks followed by ASA alone.  3. Pain Management:  Tylenol prn.  4. Mood/Behavior/Sleep: LCSW to follow for evaluation and support.              -antipsychotic agents: N/A 5. Neuropsych/cognition: This patient is capable of making decisions on his own behalf. 6. Skin/Wound Care: Routine pressure relief measures 7. Fluids/Electrolytes/Nutrition: Monitor I/O. Check CMET in am.  8. HTN: Monitor BP TID. Continue Amlodipine. Continue to hold Losartan.  9. T2DM: Hgb A1c- 7.2--on glipizide and metformin PTA             --will resume metformin and monitor for need to resume glipizide, will hold off on increasing metformin further given AKI on CKD.              --monitor BS as/hs and use SSI for elevated BS.  CBG (last 3)  Recent Labs    11/04/22 2102 11/05/22 0548 11/05/22 1139  GLUCAP 131* 119* 144*  Controlled 9/16  10. Acute on chronic renal failure: SCr 1.7 at admission-->baseline likely 1.3-1.4 range             --improved with IVF for hydration and now back to baseline.              --avoid nephrotoxic drugs. Recheck BUN/SCr in am    Latest  Ref Rng & Units 11/05/2022    6:14 AM 11/02/2022  4:15 PM 11/01/2022    6:45 AM  BMP  Glucose 70 - 99 mg/dL 841  90  324   BUN 8 - 23 mg/dL 26  21  17    Creatinine 0.61 - 1.24 mg/dL 4.01  0.27  2.53   Sodium 135 - 145 mmol/L 135  140  139   Potassium 3.5 - 5.1 mmol/L 4.7  4.7  3.8   Chloride 98 - 111 mmol/L 102  107  107   CO2 22 - 32 mmol/L 25  24  23    Calcium 8.9 - 10.3 mg/dL 9.0  8.8  8.7   Elevated again , will d/c metformin repeat BMP in 2-3 d.  BUN mildly elevated enc po fluids   11. Dementia: Continue Namenda and Aricept. Delirium precautions.   12. Low HDL:  LDL-58 and at goal. Continue Crestor and Zetia. Continue therapy  13. Hyperactive bladder: continue Tiovaz and Mirabegron.    14. Suboptimal vitamin D level: start daily D3 supplement  15. Overweight: provide dietary education    LOS: 4 days A FACE TO FACE EVALUATION WAS PERFORMED  Erick Colace 11/05/2022, 12:24 PM

## 2022-11-05 NOTE — Progress Notes (Signed)
Patient ID: James Henderson, male   DOB: [birth year], 72 y.o.   MRN: [id number]  Spoke with Angela-Pace SW to give clinical update regarding goals-supervision-min with ADL's and CGA with mobility. He is making progress and ELOS from evaluations is 17-21 days but will have team conference on Wednesday and know a target discharge date. Will update her along with wife then.

## 2022-11-05 NOTE — Progress Notes (Signed)
Occupational Therapy Session Note  Patient Details  Name: James Henderson MRN: [id number] Date of Birth: [birth year]  Today's Date: 11/05/2022 OT Individual Time: 0920-       Short Term Goals: Week 1:  OT Short Term Goal 1 (Week 1): Patient to perform toilet transfer ambulating with RW with Mod A OT Short Term Goal 2 (Week 1): Patient to perform LB dressing with Mod assist OT Short Term Goal 3 (Week 1): Patient to perform shower transfer with Mod assist OT Short Term Goal 4 (Week 1): Patient to perform grooming Mod I sitting at sink  Skilled Therapeutic Interventions/Progress Updates:    Patient received seated in wheelchair, already bathed and fully dressed, finishing breakfast.   Patient indicates he has not had a stroke - he knows his body, and is here because he had falls.  Patient reports longstanding low vision deficits due to glaucoma, but reports "soetimes he has double vision."  Eyes malalogned while seated at rest in chair.  Patient able to recall that prior therapist assisted with shower this am.  Patient indicated need to void when asked, and walked to bathroom with RW and min assist to use toilet.  Patient had continent void on toilet and able to complete hygiene with close supervision and cueing.  Patient transported to gym to address functional mobility and walker safety.  Worked on walking, reducing pressure through hands on walker, staying within the walker, and progressing feet - one in front of the other.  Patient needing min assist form walker progression with walking and for navigating turns/ direction changes.   Returned to room and left up in wheelchair with safety belt engaged and call bell in reach.  Direct handoff to nursing student.   BP:  128/91 Seated, Pulse 102, O2 sat 100 Therapy Documentation Precautions:  Precautions Precautions: Fall Precaution Comments: L hemi Restrictions Weight Bearing Restrictions: No   Pain:  No pain   Therapy/Group: Individual  Therapy  [name] 11/05/2022, 9:49 AM

## 2022-11-05 NOTE — Progress Notes (Signed)
Physical Therapy Session Note  Patient Details  Name: James Henderson MRN: [id number] Date of Birth: August 19, 1950  Today's Date: 11/05/2022 PT Individual Time: 0802-0900 PT Individual Time Calculation (min): 58 min   Short Term Goals: Week 1:  PT Short Term Goal 1 (Week 1): Pt will navigate 6" stairs with assist PT Short Term Goal 2 (Week 1): Pt will perform STS with min A consistently PT Short Term Goal 3 (Week 1): Pt will ambulate 100 ft with assist  Skilled Therapeutic Interventions/Progress Updates:      Pt in bed to start - agreeable to therapy. No reports of pain; expressive aphasia impacting communication but able to discern needs/wants with time and questioning with yes/no. Pt noted to be incontinent of urine with saturated brief and strong odor - offered to assist him in showering to work on bimanual tasks, self care, and standing balance - pt agreeable.  Supine<>sitting EOB with minA for trunk support and ++ time due to delayed processing. Sit<>stand with HHA and minA on R - ambulated in his room with R HHA and minA to shower chair with safety cues for approaching the shower bench. Pt bathed himself mostly in sitting with assist for back and legs - encouraged using LUE for GMC/FMC to challenge NMR. Pt able to stand with CGA/minA in the shower using grab bars and stay standing with CGA with posterior lean noted.   Pt completed UB/LB dressing at minA level and totalA for donning new brief.   Nursing notified of showering for documentation purposes  Pt transported to main rehab gym at w/c level for time efficiency. Sit<>Stand with minA with R HHA with patient having a moderate posterior lean with most of his weight through his heels. Gait training 165ft + 59ft +69ft with R HHA and heavy minA on level surfaces. Patient's primary gait deficits include increased lateral sway L/R, wide BOS, inconsistent step lengths, and continued posterior bias. Pt reports he typically ambulates with a RW  (for the past year).   Instructed in stair training using 6" steps with 2 hand rails - minA level with patient completing forward facing with step-to pattern - cues for general safety awareness, monitoring foot placement, and reducing his posterior lean.  Pt transported back to his room. Setup his breakfast meal at w/c level. Seat belt alarm on, call bell within reach, all needs met.   Therapy Documentation Precautions:  Precautions Precautions: Fall Precaution Comments: L hemi Restrictions Weight Bearing Restrictions: No General:     Therapy/Group: Individual Therapy  Orrin Brigham 11/05/2022, 7:44 AM

## 2022-11-05 NOTE — Progress Notes (Signed)
Occupational Therapy Session Note  Patient Details  Name: James Henderson MRN: [id number] Date of Birth: Feb 01, 1951  Today's Date: 11/05/2022 OT Individual Time: 7829-5621 OT Individual Time Calculation (min): 29 min    Short Term Goals: Week 1:  OT Short Term Goal 1 (Week 1): Patient to perform toilet transfer ambulating with RW with Mod A OT Short Term Goal 2 (Week 1): Patient to perform LB dressing with Mod assist OT Short Term Goal 3 (Week 1): Patient to perform shower transfer with Mod assist OT Short Term Goal 4 (Week 1): Patient to perform grooming Mod I sitting at sink  Skilled Therapeutic Interventions/Progress Updates:  Pt greeted seated in w/c, pt agreeable to OT intervention. Pt transported to gym in w/c with total A for time mgmt. Pt completed various NMR tasks to facilitate improved LUE motor planing for ADL participation.  Pt able to stand at hi low table to work on functional reaching with LUE to place/remove horseshoes from hanging over OH railing to simulate reaching into a cabinet etc. Pt completed task with CGA for balance, pt presented with gross grasp overall but was able to complete cylindrical grasp.   Worked on Dynamic reaching with RUE to facilitate WB'ing into LUE with pt instructed to reach across midline with RUE to transport bean bags with an emphasis on weightbearing into LUE for NMR.   Pt completed Functional mobilty attempt with transporting items however it was too much demand to attempt walking while holding cone therefore just worked on functional ambulation with no AD to facilitate improved safety and independence with functional mobility during ADLs. Pt completed ~ 10 ft of functional ambulation with MIN HHA.    Pt transported back to room with total A, pt reports need to void bladder. Pt completed stand pivot from w/c>toilet to R side with use of grab bars. Pt stood to urinate needing MIN A for clothing mgmt. Continent urine void, NT aware. Pt did urinate  slightly on pants needing a change in clothing. Donned new pants with MAX A for time mgmt.   Ended session with pt seated in w/c with alarm belt activated and all needs within reach.  Therapy Documentation Precautions:  Precautions Precautions: Fall Precaution Comments: L hemi Restrictions Weight Bearing Restrictions: No  Pain: No pain     Therapy/Group: Individual Therapy  Barron Schmid 11/05/2022, 3:38 PM

## 2022-11-06 DIAGNOSIS — I6350 Cerebral infarction due to unspecified occlusion or stenosis of unspecified cerebral artery: Secondary | ICD-10-CM | POA: Diagnosis not present

## 2022-11-06 LAB — GLUCOSE, CAPILLARY
Glucose-Capillary: 106 mg/dL — ABNORMAL HIGH (ref 70–99)
Glucose-Capillary: 119 mg/dL — ABNORMAL HIGH (ref 70–99)
Glucose-Capillary: 124 mg/dL — ABNORMAL HIGH (ref 70–99)
Glucose-Capillary: 95 mg/dL (ref 70–99)

## 2022-11-06 NOTE — Progress Notes (Signed)
Physical Therapy Session Note  Patient Details  Name: James Henderson MRN: [id number] Date of Birth: Apr 29, 1950  Today's Date: 11/06/2022 PT Individual Time: 0800-0909 + 1400-1454  PT Individual Time Calculation (min): 69 min + 54 min  Short Term Goals: Week 1:  PT Short Term Goal 1 (Week 1): Pt will navigate 6" stairs with assist PT Short Term Goal 2 (Week 1): Pt will perform STS with min A consistently PT Short Term Goal 3 (Week 1): Pt will ambulate 100 ft with assist  Skilled Therapeutic Interventions/Progress Updates:      1st session: Pt in bed to start with RN present for morning medications. Pt denies pain but requests to bath. Educated him on PT goals and POC and need to work on [employer] and pt agreeable but needing to toilet. Supine<>sitting EOB with minA for trunk support. L trunk lean in sitting that corrects with cues. Sit<>stand to RW with minA for powering to rise and has heavy posterior lean. Ambulated with minA and RW into bathroom and pt continent of bladder void while standing but already had a heavily satured brief from prior incontinent episode. Brief change completed in sitting for safety and then pt wheeled to main rehab gym for time at w/c level.   Sit<>stand to RW from w/c with light minA for correcting posterior lean. Gait training 184ft + 70ft (seated rest break) with RW and CGA/minA for stability - slow gait speed, wide BOS, ataxic on L side, and inconsistent stride length.   Pt instructed in alternating toe taps to 7" block with RW support and red TB tied around distal femurs to add challenge/resistance for hip flexion. CGA for safety and completing 2x20 reps with seated rest breaks b/w sets.  Pt completed lateral side stepping L<>R along length of // bars 4x32ft with CGA for balance and max VC for postural and positional awareness as he's somewhat inattentive to the L side.    Pt returned to his room at concluded session seated in w/c with seat belt alarm  on, call bell in reach, setup for breakfast.    2nd session: Pt sitting in w/c to start - flat affect but agreeable to therapy session. No c/o pain.   Pt transported to day room rehab gym for time. Sit<>stand to RW with minA for initiation and powering to rise. Ambulated ~5ft with CGA/minA and RW before patient reporting need to void. Unfortunately, patient began to urinate while standing in the gym. Pt assisted back to his wheelchair and then returned to his room for pant change and brief change. After cleaning, patient then reporting again that he needed to urinate. In room, ambulated to the bathroom where he was continent of bladder void in standing with CGA for balance. Returned to day room rehab gym and setup at [place] at w/c level. Completed for 10 minutes at L30cm/sec resistance.  Returned to his room and concluded session seated in w/c, safety belt alarm on, call bell in reach, and son at the bedside.    Therapy Documentation Precautions:  Precautions Precautions: Fall Precaution Comments: L hemi Restrictions Weight Bearing Restrictions: No General:       Therapy/Group: Individual Therapy  James Henderson 11/06/2022, 7:42 AM

## 2022-11-06 NOTE — Progress Notes (Signed)
Occupational Therapy Session Note  Patient Details  Name: James Henderson MRN: [id number] Date of Birth: 08/28/1950  Today's Date: 11/06/2022 OT Individual Time: 7062-3762 OT Individual Time Calculation (min): 74 min    Short Term Goals: Week 1:  OT Short Term Goal 1 (Week 1): Patient to perform toilet transfer ambulating with RW with Mod A OT Short Term Goal 2 (Week 1): Patient to perform LB dressing with Mod assist OT Short Term Goal 3 (Week 1): Patient to perform shower transfer with Mod assist OT Short Term Goal 4 (Week 1): Patient to perform grooming Mod I sitting at sink  Skilled Therapeutic Interventions/Progress Updates:   Pt seen for skilled OT session with focus on am self care retraining including shower with focus on balance, NMRE and coordination. Pt w/c level upon OT arrival. Amb w/c bedside to stall shower with RW with mod A and mod facilitation for forward weight shift especially on L side. Increased time and effort for motor planning for transfer on and off TTB with grab bar support. UB sponge bathing with close S and LB sponge bathing with figure 4 and grab bar for standing with mod fading to min A. Pt transferred to w/c for sink side grooming and dressing. Close S and min cues for sequencing and set up for shaving and oral care with facilitation for forward trunk. UB pull on shirt with set up and figure 4 and RW for standing for LB pull on undergarment, pants and slipper socks with mod A and increased time and cues. Slowed but integrated L hand during all tasks. OT transported via w/c to and from day room for large theraball trunbk activity seated for forward and lateral trunk flexion 10 reps x 2 sets in each direction. Once back in room, pt set up with w/c alarm, safety needs and 4 P's reinforced.  Pain: no pain reported throughout session   Therapy Documentation Precautions:  Precautions Precautions: Fall Precaution Comments: L hemi Restrictions Weight Bearing  Restrictions: No    Therapy/Group: Individual Therapy  Vicenta Dunning 11/06/2022, 7:40 AM

## 2022-11-06 NOTE — Progress Notes (Signed)
PROGRESS NOTE   Subjective Complaints: No new complaints this morning Working with therapy Discussed cbgs are well controlled, d/c checks  ROS: denies CP, SOB, N/V/D  Objective:   No results found. Recent Labs    11/05/22 0614  WBC 4.0  HGB 12.5*  HCT 39.5  PLT 243   Recent Labs    11/05/22 0614  NA 135  K 4.7  CL 102  CO2 25  GLUCOSE 127*  BUN 26*  CREATININE 1.84*  CALCIUM 9.0    Intake/Output Summary (Last 24 hours) at 11/06/2022 1035 Last data filed at 11/05/2022 1840 Gross per 24 hour  Intake 360 ml  Output --  Net 360 ml        Physical Exam: Vital Signs Blood pressure 136/81, pulse 85, temperature 98.3 F (36.8 C), resp. rate 18, height 5\' 6"  (1.676 m), weight 71.1 kg, SpO2 100%. Gen: no distress, normal appearing, BMI 25.3  Gen: no distress, normal appearing HEENT: oral mucosa pink and moist, NCAT Cardio: Reg rate Chest: normal effort, normal rate of breathing Abd: soft, non-distended Ext: no edema Psych: pleasant, normal affect Musculoskeletal:     Cervical back: Neck supple. No tenderness.     Comments: RUE 5-/5 RLE 5-/5 LUE posturing 4+/5 in biceps, triceps, WE, grip and FA LLE HF 4/5; KE/DF and PF 5-/5  Neurological:     Mental Status: He is alert and oriented to person, place, and time.     Comments: Dysarthric and ataxic speech. Sitting at EOB with good balance. He was able to answer orientation questions and able to follow simple one step commands.  Very dysarthric- hard to understand No clonus; no hoffman's no increased tone B/L Intact to light touch in all  4 extremities  Transferring MinA Psychiatric:     Comments: flat    Assessment/Plan: 1. Functional deficits which require 3+ hours per day of interdisciplinary therapy in a comprehensive inpatient rehab setting. Physiatrist is providing close team supervision and 24 hour management of active medical problems listed  below. Physiatrist and rehab team continue to assess barriers to discharge/monitor patient progress toward functional and medical goals  Care Tool:  Bathing              Bathing assist Assist Level: Moderate Assistance - Patient 50 - 74%     Upper Body Dressing/Undressing Upper body dressing   What is the patient wearing?: Hospital gown only    Upper body assist Assist Level: Minimal Assistance - Patient > 75%    Lower Body Dressing/Undressing Lower body dressing      What is the patient wearing?: Pants, Incontinence brief     Lower body assist Assist for lower body dressing: Maximal Assistance - Patient 25 - 49%     Toileting Toileting    Toileting assist Assist for toileting: Minimal Assistance - Patient > 75%     Transfers Chair/bed transfer  Transfers assist     Chair/bed transfer assist level: Minimal Assistance - Patient > 75%     Locomotion Ambulation   Ambulation assist      Assist level: Minimal Assistance - Patient > 75% Assistive device: Walker-rolling Max distance: 110'  Walk 10 feet activity   Assist     Assist level: Minimal Assistance - Patient > 75% Assistive device: Walker-rolling   Walk 50 feet activity   Assist    Assist level: Minimal Assistance - Patient > 75% Assistive device: Walker-rolling    Walk 150 feet activity   Assist Walk 150 feet activity did not occur: Safety/medical concerns         Walk 10 feet on uneven surface  activity   Assist Walk 10 feet on uneven surfaces activity did not occur: Safety/medical concerns         Wheelchair     Assist Is the patient using a wheelchair?: Yes Type of Wheelchair: Manual    Wheelchair assist level: Moderate Assistance - Patient 50 - 74% Max wheelchair distance: 100 ft    Wheelchair 50 feet with 2 turns activity    Assist        Assist Level: Moderate Assistance - Patient 50 - 74%   Wheelchair 150 feet activity     Assist       Assist Level: Maximal Assistance - Patient 25 - 49%   Blood pressure 136/81, pulse 85, temperature 98.3 F (36.8 C), resp. rate 18, height 5\' 6"  (1.676 m), weight 71.1 kg, SpO2 100%.  Medical Problem List and Plan: 1. 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  Antithrombotics: -DVT/anticoagulation:  Pharmaceutical: Lovenox             -antiplatelet therapy: ASA/Brilinta X 4 weeks followed by ASA alone.  3. Pain Management:  Tylenol prn.  4. Mood/Behavior/Sleep: LCSW to follow for evaluation and support.              -antipsychotic agents: N/A 5. Neuropsych/cognition: This patient is capable of making decisions on his own behalf. 6. Skin/Wound Care: Routine pressure relief measures 7. Fluids/Electrolytes/Nutrition: Monitor I/O. Check CMET in am.  8. HTN: Monitor BP TID. Continue Amlodipine. Continue to hold Losartan.  9. T2DM: Hgb A1c- 7.2--on glipizide and metformin PTA             D/c metformin given worsening creatinine             CBG (last 3)  Recent Labs    11/05/22 1604 11/05/22 2128 11/06/22 0616  GLUCAP 116* 104* 106*  Controlled, d/c CBG checks  10. Acute on chronic renal failure: SCr 1.7 at admission-->baseline likely 1.3-1.4 range             --improved with IVF for hydration and now back to baseline.              --avoid nephrotoxic drugs. Recheck BUN/SCr in am    Latest Ref Rng & Units 11/05/2022    6:14 AM 11/02/2022    4:15 PM 11/01/2022    6:45 AM  BMP  Glucose 70 - 99 mg/dL 132  90  440   BUN 8 - 23 mg/dL 26  21  17    Creatinine 0.61 - 1.24 mg/dL 1.02  7.25  3.66   Sodium 135 - 145 mmol/L 135  140  139   Potassium 3.5 - 5.1 mmol/L 4.7  4.7  3.8   Chloride 98 - 111 mmol/L 102  107  107   CO2 22 - 32 mmol/L 25  24  23    Calcium 8.9 - 10.3 mg/dL 9.0  8.8  8.7   Elevated again , will d/c metformin repeat BMP in 2-3 d.  BUN mildly  elevated enc po fluids   11. Dementia: Continue Namenda and Aricept. Delirium precautions.   12. Low HDL:  LDL-58 and at goal. Continue Crestor and Zetia. Continue therapy  13. Hyperactive bladder: continue Tiovaz and Mirabegron.    14. Suboptimal vitamin D level: continue daily D3 supplement  15. Overweight: provide dietary education, BMI reviewed and is trendind downward    LOS: 5 days A FACE TO FACE EVALUATION WAS PERFORMED  James Henderson P Chelsia Serres 11/06/2022, 10:35 AM

## 2022-11-07 DIAGNOSIS — I6350 Cerebral infarction due to unspecified occlusion or stenosis of unspecified cerebral artery: Secondary | ICD-10-CM | POA: Diagnosis not present

## 2022-11-07 LAB — BASIC METABOLIC PANEL WITH GFR
Anion gap: 10 (ref 5–15)
BUN: 36 mg/dL — ABNORMAL HIGH (ref 8–23)
CO2: 22 mmol/L (ref 22–32)
Calcium: 9.6 mg/dL (ref 8.9–10.3)
Chloride: 103 mmol/L (ref 98–111)
Creatinine, Ser: 1.72 mg/dL — ABNORMAL HIGH (ref 0.61–1.24)
GFR, Estimated: 41 mL/min — ABNORMAL LOW (ref >60)
Glucose, Bld: 141 mg/dL — ABNORMAL HIGH (ref 70–99)
Potassium: 5 mmol/L (ref 3.5–5.1)
Sodium: 135 mmol/L (ref 135–145)

## 2022-11-07 LAB — GLUCOSE, CAPILLARY
Glucose-Capillary: 119 mg/dL — ABNORMAL HIGH (ref 70–99)
Glucose-Capillary: 131 mg/dL — ABNORMAL HIGH (ref 70–99)
Glucose-Capillary: 102 mg/dL — ABNORMAL HIGH (ref 70–99)
Glucose-Capillary: 221 mg/dL — ABNORMAL HIGH (ref 70–99)

## 2022-11-07 NOTE — Progress Notes (Signed)
Physical Therapy Session Note  Patient Details  Name: James Henderson MRN: [id number] Date of Birth: Oct 12, 1950  Today's Date: 11/07/2022 PT Individual Time: 7829-5621 + 3086-5784 PT Individual Time Calculation (min): 70 min  + 69 min  Short Term Goals: Week 1:  PT Short Term Goal 1 (Week 1): Pt will navigate 6" stairs with assist PT Short Term Goal 2 (Week 1): Pt will perform STS with min A consistently PT Short Term Goal 3 (Week 1): Pt will ambulate 100 ft with assist  Skilled Therapeutic Interventions/Progress Updates:      1st session: Pt sitting in w/c to start - agreeable to therapy. No c/o pain. Denies need to use the bathroom.  Transported in w/c to main rehab gym. Sit<>stand and stand pivot transfer with CGA and RW - ++ time needed for initiation and processing. Initiated in gait training in rehab spaces, ~11ft with CGA to light minA with RW. VC for increasing L step length and TC for upright posture and postural awareness.   Pt participated in standing therapeutic exercise and activity in // bars with supervision for balance and cues for sequencing. Pt's congitive impairments impacting ability to follow simple functional movements. Pt completed lateral stepping L<>R + standing heel raises b/w steps 2x15, standing hip abd/add 2x15, standing alternating high knees 2x15.  Gait training 146ft with min/modA and R HHA around rehab gym - emphasis continues to be on upright posture and increasing stride length and L > R step length. Gait speed and stride length improved with HHA compared to using RW but much more unstable and assist required for balance. Added a 4# ankle weight to LLE only for additional gait training of 148ft with modA for stability - weight added for error recognition and emphasis on lifting LLE sufficiently through gait cycle. Patient with a heavy anterior trunk lean during gait, difficult to sustain correction.  Pt returned to his room and concluded session seated in  w/c with seat belt alarm on, call bell in reach, his son at the bedside.   2nd session: Direct handoff of care from NT with patient sitting on toilet finishing up having a BM. Pt assisted with pericare and needing assist for pulling pants/briefs up from the ground level. MinA for ambulating with RW from bathroom to wheelchair with cues required for safety approach. Pt completed hand washing at wheelchair level and then transported to day room rehab gym for time.   From mat table, worked on repeated sit<>stands from mat table level to practice forward weight shifting, sequencing transition, and powering through legs. 2x10  Pt instructed in gait training around nurses station with RW - CGA for the first ~7ft, fading to minA for the remaining ~181ft. Continues to demonstrate poor step length on L, heavy reliance of UE support through walker, and anterior trunk lean with forward COG.  Pt assisted onto the Nustep with minA ambulatory transfer, continues to require safety cues as he attempts to sit prior to being fully over surface. SetupA on Nustep and patient completed 8 minutes at L6 resistance using BUE/BLE to encourage reciprocal movement patterns and challenge endurance. Pt needing intermittent rest breaks but maintains a cadence >50 steps/minute.   Pt returned to his room and assisted back to bed with minA ambulatory transfer using the RW. Pt sitting at the foot of the bed rather than the head of the bed, so worked on lateral scooting to achieve adequate positioning. Pt with poor ability to scoot efficiently and lift hips up, also  his L hand would slip from the edge of the bed resulting in LOB. Once in supine, patient required assist to slide to [facility] with bed in trendelenburg position. All needs met at end with alarm on and son present.    Therapy Documentation Precautions:  Precautions Precautions: Fall Precaution Comments: L hemi Restrictions Weight Bearing Restrictions: No General:      Therapy/Group: Individual Therapy  Orrin Brigham 11/07/2022, 7:56 AM

## 2022-11-07 NOTE — Progress Notes (Signed)
Occupational Therapy Session Note  Patient Details  Name: James Henderson MRN: [id number] Date of Birth: 12/14/1950  Today's Date: 11/07/2022 OT Individual Time: 0800-0900 OT Individual Time Calculation (min): 60 min    Short Term Goals: Week 1:  OT Short Term Goal 1 (Week 1): Patient to perform toilet transfer ambulating with RW with Mod A OT Short Term Goal 2 (Week 1): Patient to perform LB dressing with Mod assist OT Short Term Goal 3 (Week 1): Patient to perform shower transfer with Mod assist OT Short Term Goal 4 (Week 1): Patient to perform grooming Mod I sitting at sink  Skilled Therapeutic Interventions/Progress Updates:    Patient received supine in bed - son sleeping in recliner upon arrival.  Patient agreeable to OT session and assisted to sit at edge of bed.  Patient indicated need to void and desire to shower.  Patient ambulated with walker and close supervision to contact guard when turning/ navigating around obstacles.  Patient stood and had continent void - brief clean and dry.  Patient then turned and walked to shower.  Showered while seated, standing with use of grab bar (vertical) for aspects of bathing.  Patient reliant on Ue's to aide with balance in standing.  Patient dressed himself with only min assistance.  Son indicates that patient is walking better now than prior to admission.  Son indicates that he was home with patient during the day, and patient was able to dress himself and get to bathroom without assistance.  Patient's wife works during the day.  Patient set up to eat his breakfast.  Left up in wheelchair for next therapy with safety belt on and personal items in reach, son at bedside.    Therapy Documentation Precautions:  Precautions Precautions: Fall Precaution Comments: L hemi Restrictions Weight Bearing Restrictions: No   Pain:  No pain        Therapy/Group: Individual Therapy  [name] 11/07/2022, 12:30 PM

## 2022-11-07 NOTE — Progress Notes (Signed)
PROGRESS NOTE   Subjective Complaints: No new complaints this morning Discussed patient's progress at team conference today Metformin doe not appear to have been stopped, check BMP today  ROS: denies CP, SOB, N/V/D  Objective:   No results found. Recent Labs    11/05/22 0614  WBC 4.0  HGB 12.5*  HCT 39.5  PLT 243   Recent Labs    11/05/22 0614  NA 135  K 4.7  CL 102  CO2 25  GLUCOSE 127*  BUN 26*  CREATININE 1.84*  CALCIUM 9.0    Intake/Output Summary (Last 24 hours) at 11/07/2022 1110 Last data filed at 11/06/2022 1848 Gross per 24 hour  Intake 960 ml  Output --  Net 960 ml        Physical Exam: Vital Signs Blood pressure 133/73, pulse 92, temperature 98.7 F (37.1 C), temperature source Oral, resp. rate 18, height 5\' 6"  (1.676 m), weight 71.1 kg, SpO2 99%. Gen: no distress, normal appearing, BMI 25.3 Gen: no distress, normal appearing HEENT: oral mucosa pink and moist, NCAT Cardio: Reg rate Chest: normal effort, normal rate of breathing Abd: soft, non-distended Ext: no edema Psych: pleasant, normal affect Musculoskeletal:     Cervical back: Neck supple. No tenderness.     Comments: RUE 5-/5 RLE 5-/5 LUE posturing 4+/5 in biceps, triceps, WE, grip and FA LLE HF 4/5; KE/DF and PF 5-/5  Neurological:     Mental Status: He is alert and oriented to person, place, and time.     Comments: Dysarthric and ataxic speech. Sitting at EOB with good balance. He was able to answer orientation questions and able to follow simple one step commands.  Very dysarthric- hard to understand No clonus; no hoffman's no increased tone B/L Intact to light touch in all  4 extremities  Transferring MinA Psychiatric:     Comments: flat    Assessment/Plan: 1. Functional deficits which require 3+ hours per day of interdisciplinary therapy in a comprehensive inpatient rehab setting. Physiatrist is providing close team  supervision and 24 hour management of active medical problems listed below. Physiatrist and rehab team continue to assess barriers to discharge/monitor patient progress toward functional and medical goals  Care Tool:  Bathing              Bathing assist Assist Level: Moderate Assistance - Patient 50 - 74%     Upper Body Dressing/Undressing Upper body dressing   What is the patient wearing?: Hospital gown only    Upper body assist Assist Level: Minimal Assistance - Patient > 75%    Lower Body Dressing/Undressing Lower body dressing      What is the patient wearing?: Pants, Incontinence brief     Lower body assist Assist for lower body dressing: Maximal Assistance - Patient 25 - 49%     Toileting Toileting    Toileting assist Assist for toileting: Minimal Assistance - Patient > 75%     Transfers Chair/bed transfer  Transfers assist     Chair/bed transfer assist level: Minimal Assistance - Patient > 75%     Locomotion Ambulation   Ambulation assist      Assist level: Minimal Assistance - Patient >  75% Assistive device: Walker-rolling Max distance: 110'   Walk 10 feet activity   Assist     Assist level: Minimal Assistance - Patient > 75% Assistive device: Walker-rolling   Walk 50 feet activity   Assist    Assist level: Minimal Assistance - Patient > 75% Assistive device: Walker-rolling    Walk 150 feet activity   Assist Walk 150 feet activity did not occur: Safety/medical concerns         Walk 10 feet on uneven surface  activity   Assist Walk 10 feet on uneven surfaces activity did not occur: Safety/medical concerns         Wheelchair     Assist Is the patient using a wheelchair?: Yes Type of Wheelchair: Manual    Wheelchair assist level: Moderate Assistance - Patient 50 - 74% Max wheelchair distance: 100 ft    Wheelchair 50 feet with 2 turns activity    Assist        Assist Level: Moderate Assistance -  Patient 50 - 74%   Wheelchair 150 feet activity     Assist      Assist Level: Maximal Assistance - Patient 25 - 49%   Blood pressure 133/73, pulse 92, temperature 98.7 F (37.1 C), temperature source Oral, resp. rate 18, height 5\' 6"  (1.676 m), weight 71.1 kg, SpO2 99%.  Medical Problem List and Plan: 1. 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  Antithrombotics: -DVT/anticoagulation:  Pharmaceutical: Lovenox             -antiplatelet therapy: ASA/Brilinta X 4 weeks followed by ASA alone.  3. Pain Management:  Tylenol prn.  4. Mood/Behavior/Sleep: LCSW to follow for evaluation and support.              -antipsychotic agents: N/A 5. Neuropsych/cognition: This patient is capable of making decisions on his own behalf. 6. Skin/Wound Care: Routine pressure relief measures 7. Fluids/Electrolytes/Nutrition: Monitor I/O. Check CMET in am.  8. HTN: Monitor BP TID. Continue Amlodipine. Continue to hold Losartan.  9. T2DM: Hgb A1c- 7.2--on glipizide and metformin PTA          Continue metformin             CBG (last 3)  Recent Labs    11/06/22 1622 11/06/22 2131 11/07/22 0549  GLUCAP 124* 95 119*  Restart CBG checks, d/c ISS  10. Acute on chronic renal failure: SCr 1.7 at admission-->baseline likely 1.3-1.4 range             --improved with IVF for hydration and now back to baseline.              --avoid nephrotoxic drugs. Recheck BUN/SCr in am    Latest Ref Rng & Units 11/05/2022    6:14 AM 11/02/2022    4:15 PM 11/01/2022    6:45 AM  BMP  Glucose 70 - 99 mg/dL 161  90  096   BUN 8 - 23 mg/dL 26  21  17    Creatinine 0.61 - 1.24 mg/dL 0.45  4.09  8.11   Sodium 135 - 145 mmol/L 135  140  139   Potassium 3.5 - 5.1 mmol/L 4.7  4.7  3.8   Chloride 98 -  111 mmol/L 102  107  107   CO2 22 - 32 mmol/L 25  24  23    Calcium 8.9 - 10.3 mg/dL 9.0  8.8  8.7   Repeat BMP  today  11. Dementia: Continue Namenda and Aricept. Delirium precautions.   12. Low HDL:  LDL-58 and at goal. Continue Crestor and Zetia. Continue therapy  13. Hyperactive bladder: continue Tiovaz and Mirabegron.    14. Suboptimal vitamin D level: continue daily D3 supplement  15. Overweight: provide dietary education, BMI reviewed and is trendind downward    LOS: 6 days A FACE TO FACE EVALUATION WAS PERFORMED  Chyler Creely P Ariane Ditullio 11/07/2022, 11:10 AM

## 2022-11-07 NOTE — Progress Notes (Signed)
Patient ID: James Henderson, male   DOB: Jun 09, 1950, 72 y.o.   MRN: [id number]  Met with pt and son who is present in his room to give them a team conference update regarding goals of supervision-CGA level and target discharge ate is 9/25. Made son aware he will need to hold on to the gait belt when Dad up walking. Have left message with [facility] Social worker as well regarding discharge date and goals. Along with follow up PT & OT. Await return call and update wife as well.

## 2022-11-08 DIAGNOSIS — I6350 Cerebral infarction due to unspecified occlusion or stenosis of unspecified cerebral artery: Secondary | ICD-10-CM | POA: Diagnosis not present

## 2022-11-08 LAB — CBC
HCT: 38.9 % — ABNORMAL LOW (ref 39.0–52.0)
Hemoglobin: 12.8 g/dL — ABNORMAL LOW (ref 13.0–17.0)
MCH: 29.9 pg (ref 26.0–34.0)
MCHC: 32.9 g/dL (ref 30.0–36.0)
MCV: 90.9 fL (ref 80.0–100.0)
Platelets: 219 10*3/uL (ref 150–400)
RBC: 4.28 MIL/uL (ref 4.22–5.81)
RDW: 12.9 % (ref 11.5–15.5)
WBC: 4.2 10*3/uL (ref 4.0–10.5)
nRBC: 0 % (ref 0.0–0.2)

## 2022-11-08 LAB — BASIC METABOLIC PANEL
Anion gap: 10 (ref 5–15)
BUN: 35 mg/dL — ABNORMAL HIGH (ref 8–23)
CO2: 20 mmol/L — ABNORMAL LOW (ref 22–32)
Calcium: 9.1 mg/dL (ref 8.9–10.3)
Chloride: 105 mmol/L (ref 98–111)
Creatinine, Ser: 1.69 mg/dL — ABNORMAL HIGH (ref 0.61–1.24)
GFR, Estimated: 42 mL/min — ABNORMAL LOW (ref >60)
Glucose, Bld: 138 mg/dL — ABNORMAL HIGH (ref 70–99)
Potassium: 4.7 mmol/L (ref 3.5–5.1)
Sodium: 135 mmol/L (ref 135–145)

## 2022-11-08 LAB — GLUCOSE, CAPILLARY
Glucose-Capillary: 117 mg/dL — ABNORMAL HIGH (ref 70–99)
Glucose-Capillary: 105 mg/dL — ABNORMAL HIGH (ref 70–99)
Glucose-Capillary: 105 mg/dL — ABNORMAL HIGH (ref 70–99)
Glucose-Capillary: 141 mg/dL — ABNORMAL HIGH (ref 70–99)

## 2022-11-08 MED ORDER — L-METHYLFOLATE-B6-B12 3-35-2 MG PO TABS
1.0000 | ORAL_TABLET | Freq: Every day | ORAL | Status: DC
  Administered 2022-11-08 – 2022-11-13 (×6): 1 via ORAL
  Filled 2022-11-08 (×6): qty 1

## 2022-11-08 MED ORDER — MAGNESIUM GLUCONATE 500 MG PO TABS
250.0000 mg | ORAL_TABLET | Freq: Every day | ORAL | Status: DC
  Administered 2022-11-08 – 2022-11-11 (×4): 250 mg via ORAL
  Filled 2022-11-08 (×4): qty 1

## 2022-11-08 NOTE — Progress Notes (Signed)
Occupational Therapy Session Note  Patient Details  Name: James Henderson MRN: [id number] Date of Birth: Jul 07, 1950  Today's Date: 11/08/2022 OT Individual Time: 8657-8469 OT Individual Time Calculation (min): 72 min    Short Term Goals: Week 1:  OT Short Term Goal 1 (Week 1): Patient to perform toilet transfer ambulating with RW with Mod A OT Short Term Goal 2 (Week 1): Patient to perform LB dressing with Mod assist OT Short Term Goal 3 (Week 1): Patient to perform shower transfer with Mod assist OT Short Term Goal 4 (Week 1): Patient to perform grooming Mod I sitting at sink  Skilled Therapeutic Interventions/Progress Updates:    Patient received supine in bed, agreeable to get up and take a shower.  Ambulated to bathroom with contact guard initially, then close supervision.  Patient stood to urinate, then navigated safely to shower bench.  Assistance provided to don incontinence brief.  Patient needing cueing to wash hair, consistently uses grab bar for all standing activities.   Walked to bed to complete dressing.  Needed coaxing and increased time to don right sock.  Reports - "my kids will do it."  Patient continues to show improvement in stand balance and stand tolerance - needs continued work to decrease reliance on Ue's in standing.  Transported to gym to address static to dynamic stand balance.  Complete box and blocks in standing 5 blocks left 22 right, frequent loss of balance, left and/or posterior sway.  Left in bed with alarm engaged and personal items in reach.   Therapy Documentation Precautions:  Precautions Precautions: Fall Precaution Comments: L hemi Restrictions Weight Bearing Restrictions: No  Pain:  Denies pain        Therapy/Group: Individual Therapy  [name] 11/08/2022, 8:48 AM

## 2022-11-08 NOTE — Patient Care Conference (Signed)
Inpatient RehabilitationTeam Conference and Plan of Care Update Date: 11/07/2022   Time: 11:06 AM    Patient Name: James Henderson      Medical Record Number: [id number]  Date of Birth: Jul 19, 1950 Sex: Male         Room/Bed: 4W26C/4W26C-01 Payor Info: Payor: [facility] / Plan: [facility] [employer] [facility] / Product Type: *No Product type* /    Admit Date/Time:  11/01/2022  3:52 PM  Primary Diagnosis:  Posterior circulation stroke [facility])  Hospital Problems: Principal Problem:   Posterior circulation stroke [facility]) Active Problems:   Stroke (cerebrum) [facility])    Expected Discharge Date: Expected Discharge Date: 11/14/22  Team Members Present: Physician leading conference: Dr. Sula Soda Social Worker Present: Dossie Der, LCSW Nurse Present: Chana Bode, RN PT Present: Wynelle Link, PT OT Present: Bretta Bang, OT SLP Present: Jeannie Done, SLP PPS Coordinator present : Fae Pippin, SLP     Current Status/Progress Goal Weekly Team Focus  Bowel/Bladder   Pt is continent  B/B with episodes of bladder incontonence. LBM  11/05/22   Pt will regain full B/B incontinence with normal pattern   Assist with toileting q2-4hrs while qshift/prn    Swallow/Nutrition/ Hydration               ADL's   Max assist initially, curently mod assist BADL   Contact guard/ supervision   balance, activity tolerance, increase I with BADL    Mobility   Near goal level. minA bed mobility, minA sit<>stand and stand pivot transfers, CGA to min for ambuation with RW. CGA for navigating stairs.   CGA to minA  general strengthening and endurance, NMR for L side, DC planning, safety awareness    Communication                Safety/Cognition/ Behavioral Observations               Pain   Pt denies pain at this time   Pt will be free from pain and will verbalize when in pain   Assess pt for pain qshift/prn and provide education on pain education    Skin   Skin is warm and dry with  no breakdown   Pt will maintain skin intergrity with no breakdown  Assess pt sin qshift/prn and provide education to prevent breakdown      Discharge Planning:  HOme with wife and son-wife works but son will be with while she is working. [facility] [employer] two days per [facility].   Team Discussion: Patient post CVA; limited by poor endurance, activity intolerance and balance with frequent falls PTA and cognitive deficts; fair rehabilitation potential.   Patient on target to meet rehab goals: yes, currently needs min - mod assist for ADLs min assist for sit- stand and stand pivot transfers using a RW. Needs ACG for ambulation and steps. Goals for discharge set for CGA overall.  *See Care Plan and progress notes for long and short-term goals.   Revisions to Treatment Plan:  N/a   Teaching Needs: Safety, medications, transfers, toileting, etc.   Current Barriers to Discharge: N/a  Possible Resolutions to Barriers: Family education     Medical Summary Current Status: overweight, acute kidney injury on chronic kidney injury, urinary incontinence  Barriers to Discharge: Medical stability  Barriers to Discharge Comments: overweight, acute kidney injury on chronic kidney injury, urinary incontinence Possible Resolutions to [employer] Focus: provide dietary education, repeat basic metabolic panel today, timed voiding ordered   Continued  Need for Acute Rehabilitation Level of Care: The patient requires daily medical management by a physician with specialized training in physical medicine and rehabilitation for the following reasons: Direction of a multidisciplinary physical rehabilitation program to maximize functional independence : Yes Medical management of patient stability for increased activity during participation in an intensive rehabilitation regime.: Yes Analysis of laboratory values and/or radiology reports with any subsequent need for medication adjustment and/or medical  intervention. : Yes   I attest that I was present, lead the team conference, and concur with the assessment and plan of the team.   Chana Bode B 11/08/2022, 12:14 PM

## 2022-11-08 NOTE — Progress Notes (Signed)
PROGRESS NOTE   Subjective Complaints: No new complaints this morning Discussed improved creatinine Patient's chart reviewed- No issues reported overnight Vitals signs stable except for tachycardia  ROS: denies CP, SOB, N/V/D  Objective:   No results found. Recent Labs    11/08/22 0721  WBC 4.2  HGB 12.8*  HCT 38.9*  PLT 219   Recent Labs    11/07/22 1142 11/08/22 0721  NA 135 135  K 5.0 4.7  CL 103 105  CO2 22 20*  GLUCOSE 141* 138*  BUN 36* 35*  CREATININE 1.72* 1.69*  CALCIUM 9.6 9.1    Intake/Output Summary (Last 24 hours) at 11/08/2022 0935 Last data filed at 11/08/2022 0806 Gross per 24 hour  Intake 358 ml  Output --  Net 358 ml        Physical Exam: Vital Signs Blood pressure 132/69, pulse 84, temperature 98.1 F (36.7 C), resp. rate 18, height 5\' 6"  (1.676 m), weight 71.1 kg, SpO2 100%. Gen: no distress, normal appearing, BMI 25.3 Gen: no distress, normal appearing HEENT: oral mucosa pink and moist, NCAT Cardio: Reg rate Chest: normal effort, normal rate of breathing Abd: soft, non-distended Ext: no edema Psych: pleasant, normal affect Musculoskeletal:     Cervical back: Neck supple. No tenderness.     Comments: RUE 5-/5 RLE 5-/5 LUE posturing 4+/5 in biceps, triceps, WE, grip and FA LLE HF 4/5; KE/DF and PF 5-/5  Neurological:     Mental Status: He is alert and oriented to person, place, and time.     Comments: Dysarthric and ataxic speech. Sitting at EOB with good balance. He was able to answer orientation questions and able to follow simple one step commands.  Very dysarthric- hard to understand No clonus; no hoffman's no increased tone B/L Intact to light touch in all  4 extremities  Transferring MinA Psychiatric:     Comments: flat    Assessment/Plan: 1. Functional deficits which require 3+ hours per day of interdisciplinary therapy in a comprehensive inpatient rehab  setting. Physiatrist is providing close team supervision and 24 hour management of active medical problems listed below. Physiatrist and rehab team continue to assess barriers to discharge/monitor patient progress toward functional and medical goals  Care Tool:  Bathing    Body parts bathed by patient: Right arm, Left arm, Chest, Abdomen, Front perineal area, Right upper leg, Left upper leg, Right lower leg, Left lower leg, Buttocks, Face         Bathing assist Assist Level: Contact Guard/Touching assist     Upper Body Dressing/Undressing Upper body dressing   What is the patient wearing?: Pull over shirt    Upper body assist Assist Level: Set up assist    Lower Body Dressing/Undressing Lower body dressing      What is the patient wearing?: Pants, Incontinence brief     Lower body assist Assist for lower body dressing: Minimal Assistance - Patient > 75%     Toileting Toileting    Toileting assist Assist for toileting: Minimal Assistance - Patient > 75%     Transfers Chair/bed transfer  Transfers assist     Chair/bed transfer assist level: Contact Guard/Touching assist  Locomotion Ambulation   Ambulation assist      Assist level: Minimal Assistance - Patient > 75% Assistive device: Walker-rolling Max distance: 110'   Walk 10 feet activity   Assist     Assist level: Minimal Assistance - Patient > 75% Assistive device: Walker-rolling   Walk 50 feet activity   Assist    Assist level: Minimal Assistance - Patient > 75% Assistive device: Walker-rolling    Walk 150 feet activity   Assist Walk 150 feet activity did not occur: Safety/medical concerns         Walk 10 feet on uneven surface  activity   Assist Walk 10 feet on uneven surfaces activity did not occur: Safety/medical concerns         Wheelchair     Assist Is the patient using a wheelchair?: Yes Type of Wheelchair: Manual    Wheelchair assist level: Moderate  Assistance - Patient 50 - 74% Max wheelchair distance: 100 ft    Wheelchair 50 feet with 2 turns activity    Assist        Assist Level: Moderate Assistance - Patient 50 - 74%   Wheelchair 150 feet activity     Assist      Assist Level: Maximal Assistance - Patient 25 - 49%   Blood pressure 132/69, pulse 84, temperature 98.1 F (36.7 C), resp. rate 18, height 5\' 6"  (1.676 m), weight 71.1 kg, SpO2 100%.  Medical Problem List and Plan: 1. Functional deficits secondary to R midbrain stroke             -patient may  shower             -ELOS/Goals: 7-10 days supervision             Metanx started daily 2.  Antithrombotics: -DVT/anticoagulation:  Pharmaceutical: Lovenox             -antiplatelet therapy: ASA/Brilinta X 4 weeks followed by ASA alone.  3. Pain Management:  Tylenol prn.  4. Mood/Behavior/Sleep: LCSW to follow for evaluation and support.              -antipsychotic agents: N/A 5. Neuropsych/cognition: This patient is capable of making decisions on his own behalf. 6. Skin/Wound Care: Routine pressure relief measures 7. Fluids/Electrolytes/Nutrition: Monitor I/O. Check CMET in am.  8. HTN: Monitor BP TID. Continue Amlodipine. Continue to hold Losartan.  9. T2DM: Hgb A1c- 7.2--on glipizide and metformin PTA          Continue metformin             CBG (last 3)  Recent Labs    11/07/22 1655 11/07/22 2024 11/08/22 0622  GLUCAP 102* 221* 117*  Restart CBG checks, d/c ISS  10. Acute on chronic renal failure: SCr 1.7 at admission-->baseline likely 1.3-1.4 range             --improved with IVF for hydration and now back to baseline.              --avoid nephrotoxic drugs. Discussed that creatinine is improving    Latest Ref Rng & Units 11/08/2022    7:21 AM 11/07/2022   11:42 AM 11/05/2022    6:14 AM  BMP  Glucose 70 - 99 mg/dL 962  952  841   BUN 8 - 23 mg/dL 35  36  26   Creatinine 0.61 - 1.24 mg/dL 3.24  4.01  0.27   Sodium 135 - 145 mmol/L 135  135   135  Potassium 3.5 - 5.1 mmol/L 4.7  5.0  4.7   Chloride 98 - 111 mmol/L 105  103  102   CO2 22 - 32 mmol/L 20  22  25    Calcium 8.9 - 10.3 mg/dL 9.1  9.6  9.0   Repeat BMP today  11. Dementia: Continue Namenda and Aricept. Delirium precautions.   12. Low HDL:  LDL-58 and at goal. Continue Crestor and Zetia. Continue therapy  13. Hyperactive bladder: continue Tiovaz and Mirabegron.    14. Suboptimal vitamin D level: continue daily D3 supplement  15. Overweight: provide dietary education, BMI reviewed and is trendind downward, start magnesium gluconate 250mg  HS  15. Tachycardia: start magnesium gluconate 250mg  HS    LOS: 7 days A FACE TO FACE EVALUATION WAS PERFORMED  Clint Bolder P Rosan Calbert 11/08/2022, 9:35 AM

## 2022-11-08 NOTE — Progress Notes (Signed)
Physical Therapy Session Note  Patient Details  Name: James Henderson MRN: [id number] Date of Birth: [birth year]  Today's Date: 11/08/2022 PT Individual Time: 7829-5621 + 1430-1525 PT Individual Time Calculation (min): 72 min  + 55 min  Short Term Goals: Week 1:  PT Short Term Goal 1 (Week 1): Pt will navigate 6" stairs with assist PT Short Term Goal 2 (Week 1): Pt will perform STS with min A consistently PT Short Term Goal 3 (Week 1): Pt will ambulate 100 ft with assist  Skilled Therapeutic Interventions/Progress Updates:      1st session: Pt in bed to start - agreeable to therapy. No c/o pain. Supine<>sitting EOB with sueprvision with ++ time for processing and initiation. Sit<>stand to RW with CGA from EOB with cues for hand placement. Ambulates with CGA and RW into the bathroom - forward flexed trunk, quite unsteady, and heavy reliance of UE support for balance - anticipate these deficits are near baseline mobility.   Pt continent of bladder while standing with close supervision for safety - pt relying on UE support on grab bars and toilet plumbing for balance. At the sink, patient completes hand hygiene in standing - poor safety awareness while approaching the sink, abandoning his RW and leaning heavily on  both elbows at the corner of the sink as he attempts to wash his hands. Education on safety awareness, fall prevention.  Pt transported in w/c to main room rehab gym. Pt instructed in stair training using 6" steps and 2 hand rails for support. Pt navigated up/down x12 steps total with CGA for the first x8 steps and minA for the last x4 steps. Pt demonstrating reciprocal stepping for both directions and no knee buckling but does have a forward flexed trunk impacting his balance.  Pt completed TUG to assess functional outcome measure for falls risk. Pt used RW with close supervision for safety. He completed the TUG in 1 minute and 51 seconds. Delayed processing speed for sit<>Stand and wide  turns around the cone impacted his score. Scores > 13.5 seconds indicate increased falls risk.  Pt completed an ambulatory car transfer with minA needed for safety as he turns to sit into the car. Otherwise, able to manage BLE without assist and reposition self in car. Practiced unlevel gait training up/down x60ft ramp with CGA and RW - cues for keeping body within walker frame and safety awareness to avoid gaining too much speed as he descends.  Pt assisted onto mat table for mat table there-ex: -2x15 heel slides -2x10 bridges -2x15 hip abd/add -2x20 ankle pumps -2x15 glut sets  Pt returned to his room and concluded session seated in wheelchair with seat belt alarm on, call bell within reach.    2nd session: Pt sitting in w/c and agreeable to therapy session. No c/o pain. Transported in w/c to main rehab gym.  Sit<>stand to RW with minA for initiation; cues for hand placement. Gait training 1108ft with CGA to minA and RW in rehab spaces, emphasis on increasing gait speed and stride length. Continues to demonstrate wide BOS with forward flexed trunk, short/choppy steps, decreased L hip/knee flexion in swing.   Pt participated in dance group at wheelchair level with PT assisting with sequencing, attention, and participation. Movements focused on large amplitude, quick movements, and involving paretic L limb for NMR.   Pt returned to his room and assisted back to his bed, per his request to lie down. MinA for stand pivot transfer, minA for sit>supine. Bed alarm on, call  bell in lap.     Therapy Documentation Precautions:  Precautions Precautions: Fall Precaution Comments: L hemi Restrictions Weight Bearing Restrictions: No General:     Therapy/Group: Individual Therapy  Sage Kopera P Pricilla Moehle  PT, DPT, CSRS  11/08/2022, 7:58 AM

## 2022-11-09 DIAGNOSIS — I6350 Cerebral infarction due to unspecified occlusion or stenosis of unspecified cerebral artery: Secondary | ICD-10-CM | POA: Diagnosis not present

## 2022-11-09 LAB — GLUCOSE, CAPILLARY
Glucose-Capillary: 122 mg/dL — ABNORMAL HIGH (ref 70–99)
Glucose-Capillary: 114 mg/dL — ABNORMAL HIGH (ref 70–99)
Glucose-Capillary: 110 mg/dL — ABNORMAL HIGH (ref 70–99)

## 2022-11-09 MED ORDER — DOCUSATE SODIUM 100 MG PO CAPS
100.0000 mg | ORAL_CAPSULE | Freq: Every day | ORAL | Status: DC
  Administered 2022-11-09 – 2022-11-12 (×4): 100 mg via ORAL
  Filled 2022-11-09 (×4): qty 1

## 2022-11-09 NOTE — Progress Notes (Signed)
Physical Therapy Weekly Progress Note  Patient Details  Name: James Henderson MRN: [id number] Date of Birth: 16-Nov-1950  Beginning of progress report period: November 02, 2022 End of progress report period: November 09, 2022  Today's Date: 11/09/2022 PT Individual Time: 5621-3086 PT Individual Time Calculation (min): 42 min   Patient has met 3 of 3 short term goals.  [name] is making appropriate progress towards LTG of CGA/minA. He is approaching goal level and is primarily limited by L sided weakness, impaired standing balance, cognitive impairments, and decreased safety awareness. He has developed learned helplessness from family support and lacks initiation. Patient on track for DC date of 11/14/22.   Patient continues to demonstrate the following deficits muscle weakness and muscle joint tightness, decreased cardiorespiratoy endurance, abnormal tone and motor apraxia, decreased attention to left, decreased initiation, decreased attention, decreased awareness, decreased problem solving, decreased safety awareness, decreased memory, and delayed processing, and decreased sitting balance, decreased standing balance, decreased postural control, hemiplegia, and decreased balance strategies and therefore will continue to benefit from skilled PT intervention to increase functional independence with mobility.  Patient progressing toward long term goals..  Continue plan of care.  PT Short Term Goals Week 1:  PT Short Term Goal 1 (Week 1): Pt will navigate 6" stairs with assist PT Short Term Goal 1 - Progress (Week 1): Met PT Short Term Goal 2 (Week 1): Pt will perform STS with min A consistently PT Short Term Goal 2 - Progress (Week 1): Met PT Short Term Goal 3 (Week 1): Pt will ambulate 100 ft with assist PT Short Term Goal 3 - Progress (Week 1): Met Week 2:  PT Short Term Goal 1 (Week 2): STG = LTG  Skilled Therapeutic Interventions/Progress Updates:      Pt sitting in w/c to start -  agreeable to therapy session. Pt with rollator in room - tried with earlier OT session. Pt reports he has a rollator at home and typically uses this for mobility. Pt transported to day room rehab gym for time. Sit<>Stand to rollator with CGA with cues for hand placement and safety for locking the brakes. Pt ambulates 167ft with CGA/minA and rollator around nurses station - improved gait speed, upright posture, and efficiency with mobility with rollator compared to RW but similar gait deficits and unsteadiness. Practiced locating items and item retrieval using the rollator to help assess falls risk and safety awareness. Bean bags placed around rehab gym, in tight spaces and awkward angles. Pt needing CGA for safety and min cues for managing rollator or navigating his environment. Pt returned to his room - bed not in the room as it's being replaced. So pt left sitting upright in w/c with seat belt alarm on. Son present, call bell in reach.   Therapy Documentation Precautions:  Precautions Precautions: Fall Precaution Comments: L hemi Restrictions Weight Bearing Restrictions: No General:     Therapy/Group: Individual Therapy  Orrin Brigham 11/09/2022, 7:51 AM

## 2022-11-09 NOTE — Progress Notes (Signed)
PROGRESS NOTE   Subjective Complaints: No new complaints this morning Tolerated OT today Patient's chart reviewed- No issues reported overnight Vitals signs stable    ROS: denies CP, SOB, N/V/D  Objective:   No results found. Recent Labs    11/08/22 0721  WBC 4.2  HGB 12.8*  HCT 38.9*  PLT 219   Recent Labs    11/07/22 1142 11/08/22 0721  NA 135 135  K 5.0 4.7  CL 103 105  CO2 22 20*  GLUCOSE 141* 138*  BUN 36* 35*  CREATININE 1.72* 1.69*  CALCIUM 9.6 9.1    Intake/Output Summary (Last 24 hours) at 11/09/2022 1020 Last data filed at 11/09/2022 0908 Gross per 24 hour  Intake 1016 ml  Output 500 ml  Net 516 ml        Physical Exam: Vital Signs Blood pressure 128/75, pulse 90, temperature (!) 97.5 F (36.4 C), resp. rate 16, height 5\' 6"  (1.676 m), weight 71.1 kg, SpO2 100%. Gen: no distress, normal appearing, BMI 25.3 Gen: no distress, normal appearing HEENT: oral mucosa pink and moist, NCAT Cardio: Reg rate Chest: normal effort, normal rate of breathing Abd: soft, non-distended Ext: no edema Psych: pleasant, normal affect Musculoskeletal:     Cervical back: Neck supple. No tenderness.     Comments: RUE 5-/5 RLE 5-/5 LUE posturing 4+/5 in biceps, triceps, WE, grip and FA LLE HF 4/5; KE/DF and PF 5-/5  Neurological:     Mental Status: He is alert and oriented to person, place, and time.     Comments: Dysarthric and ataxic speech. Sitting at EOB with good balance. He was able to answer orientation questions and able to follow simple one step commands.  Very dysarthric- hard to understand No clonus; no hoffman's no increased tone B/L Intact to light touch in all  4 extremities  Transferring MinA Psychiatric:     Comments: flat    Assessment/Plan: 1. Functional deficits which require 3+ hours per day of interdisciplinary therapy in a comprehensive inpatient rehab setting. Physiatrist is  providing close team supervision and 24 hour management of active medical problems listed below. Physiatrist and rehab team continue to assess barriers to discharge/monitor patient progress toward functional and medical goals  Care Tool:  Bathing    Body parts bathed by patient: Right arm, Left arm, Chest, Abdomen, Front perineal area, Right upper leg, Left upper leg, Right lower leg, Left lower leg, Buttocks, Face         Bathing assist Assist Level: Contact Guard/Touching assist     Upper Body Dressing/Undressing Upper body dressing   What is the patient wearing?: Pull over shirt    Upper body assist Assist Level: Set up assist    Lower Body Dressing/Undressing Lower body dressing      What is the patient wearing?: Pants, Incontinence brief     Lower body assist Assist for lower body dressing: Minimal Assistance - Patient > 75%     Toileting Toileting    Toileting assist Assist for toileting: Minimal Assistance - Patient > 75%     Transfers Chair/bed transfer  Transfers assist     Chair/bed transfer assist level: Contact Guard/Touching assist  Locomotion Ambulation   Ambulation assist      Assist level: Minimal Assistance - Patient > 75% Assistive device: Walker-rolling Max distance: 110'   Walk 10 feet activity   Assist     Assist level: Minimal Assistance - Patient > 75% Assistive device: Walker-rolling   Walk 50 feet activity   Assist    Assist level: Minimal Assistance - Patient > 75% Assistive device: Walker-rolling    Walk 150 feet activity   Assist Walk 150 feet activity did not occur: Safety/medical concerns         Walk 10 feet on uneven surface  activity   Assist Walk 10 feet on uneven surfaces activity did not occur: Safety/medical concerns         Wheelchair     Assist Is the patient using a wheelchair?: Yes Type of Wheelchair: Manual    Wheelchair assist level: Moderate Assistance - Patient 50 -  74% Max wheelchair distance: 100 ft    Wheelchair 50 feet with 2 turns activity    Assist        Assist Level: Moderate Assistance - Patient 50 - 74%   Wheelchair 150 feet activity     Assist      Assist Level: Maximal Assistance - Patient 25 - 49%   Blood pressure 128/75, pulse 90, temperature (!) 97.5 F (36.4 C), resp. rate 16, height 5\' 6"  (1.676 m), weight 71.1 kg, SpO2 100%.  Medical Problem List and Plan: 1. Functional deficits secondary to R midbrain stroke             -patient may  shower             -ELOS/Goals: 7-10 days supervision             Metanx started daily 2.  Antithrombotics: -DVT/anticoagulation:  Pharmaceutical: Lovenox             -antiplatelet therapy: ASA/Brilinta X 4 weeks followed by ASA alone.  3. Pain Management:  Tylenol prn.  4. Mood/Behavior/Sleep: LCSW to follow for evaluation and support.              -antipsychotic agents: N/A 5. Neuropsych/cognition: This patient is capable of making decisions on his own behalf. 6. Skin/Wound Care: Routine pressure relief measures 7. Fluids/Electrolytes/Nutrition: Monitor I/O. Check CMET in am.  8. HTN: Monitor BP TID. Continue Amlodipine. Continue to hold Losartan.  9. T2DM: Hgb A1c- 7.2--on glipizide and metformin PTA          Continue metformin             CBG (last 3)  Recent Labs    11/08/22 1706 11/08/22 2024 11/09/22 0540  GLUCAP 105* 141* 122*  Restart CBG checks, d/c ISS  10. Acute on chronic renal failure: SCr 1.7 at admission-->baseline likely 1.3-1.4 range             --improved with IVF for hydration and now back to baseline.              --avoid nephrotoxic drugs. Discussed that creatinine is improving    Latest Ref Rng & Units 11/08/2022    7:21 AM 11/07/2022   11:42 AM 11/05/2022    6:14 AM  BMP  Glucose 70 - 99 mg/dL 629  528  413   BUN 8 - 23 mg/dL 35  36  26   Creatinine 0.61 - 1.24 mg/dL 2.44  0.10  2.72   Sodium 135 - 145 mmol/L 135  135  135  Potassium 3.5 -  5.1 mmol/L 4.7  5.0  4.7   Chloride 98 - 111 mmol/L 105  103  102   CO2 22 - 32 mmol/L 20  22  25    Calcium 8.9 - 10.3 mg/dL 9.1  9.6  9.0   Repeat BMP today  11. Dementia: Continue Namenda and Aricept. Delirium precautions.   12. Low HDL:  LDL-58 and at goal. Continue Crestor and Zetia. Continue therapy  13. Hyperactive bladder: continue Tiovaz and Mirabegron.    14. Suboptimal vitamin D level: continue daily D3 supplement  15. Overweight: provide dietary education, BMI reviewed and is trendind downward, continue magnesium gluconate 250mg  HS  15. Tachycardia: start magnesium gluconate 250mg  HS, resolved, continue    16. Constipation: add colace daily  LOS: 8 days A FACE TO FACE EVALUATION WAS PERFORMED  Judie Hollick P Donaldson Richter 11/09/2022, 10:20 AM

## 2022-11-09 NOTE — Progress Notes (Signed)
Physical Therapy Session Note  Patient Details  Name: James Henderson MRN: [id number] Date of Birth: February 03, 1951  Today's Date: 11/09/2022 PT Individual Time: 7829-5621 PT Individual Time Calculation (min): 47 min   Short Term Goals: Week 1:  PT Short Term Goal 1 (Week 1): Pt will navigate 6" stairs with assist PT Short Term Goal 1 - Progress (Week 1): Met PT Short Term Goal 2 (Week 1): Pt will perform STS with min A consistently PT Short Term Goal 2 - Progress (Week 1): Met PT Short Term Goal 3 (Week 1): Pt will ambulate 100 ft with assist PT Short Term Goal 3 - Progress (Week 1): Met Week 2:  PT Short Term Goal 1 (Week 2): STG = LTG  Skilled Therapeutic Interventions/Progress Updates:  Patient seated upright in w/c on entrance to room. Patient alert and agreeable to PT session.   Patient with no pain complaint at start of session.  Therapeutic Activity: Transfers: Pt performed sit<>stand and stand pivot transfers throughout session with MinA and momentum build. Provided vc for technique with slight improvement during session.  Gait Training/ NMR:  Pt ambulated 75 ft using rollator with Min/ ModA for balance and maintaining more upright position rather than lean forward. Demonstrated flexed position with . Provided vc/ tc for body positioning and balance over feet as pt tends to maintain COM posterior to foot positioning.  NMR facilitated during session with focus on balance. Guided in backward ambulation with need for up to Mod/ MaxA to maintain balance d/t already posterior weight positioning. TC along with verbal instruction re: body positioning and balance with pt able to correct for minimal periods and requiring reset d/t posterior LOB.   Pt guided in sidestepping at hallway HR 25' x4. Demos need for vc to maintain more posterior foot positioning in order to use HR less to maintain balance. Demos good ability to utilize  lines and color tiles in floor for more appropriate foot  placement initially and then improves to maintaining gaze to goal distance. Requires initial cueing for stepping each trailing leg as initially drags. Then improved performance to complete.   NMR performed for improvements in motor control and coordination, balance, sequencing, judgement, and self confidence/ efficacy in performing all aspects of mobility at highest level of independence.   Bed Mobility: Pt performed sit>supine with extra time and focus on bringing BLE to bed surface but completes with CGA.   Patient supine in bed at end of session with brakes locked, bed alarm set, and all needs within reach. Ice water provided on request.   Therapy Documentation Precautions:  Precautions Precautions: Fall Precaution Comments: L hemi Restrictions Weight Bearing Restrictions: No  Pain:  No pain related this session.   Therapy/Group: Individual Therapy  Loel Dubonnet PT, DPT, CSRS 11/09/2022, 6:11 PM

## 2022-11-09 NOTE — Progress Notes (Signed)
Occupational Therapy Weekly Progress Note  Patient Details  Name: James Henderson MRN: [id number] Date of Birth: [birth year]  Beginning of progress report period: November 02, 2022 End of progress report period: November 09, 2022  Today's Date: 11/09/2022 OT Individual Time: 3557-3220 OT Individual Time Calculation (min): 55 min    Patient has met 4 of 4 short term goals.  Patient with improved activity tolerance, balance, and functional mobility  Patient continues to demonstrate the following deficits: abnormal tone, unbalanced muscle activation, ataxia, and decreased coordination, decreased attention to left, decreased awareness, decreased problem solving, decreased safety awareness, decreased memory, and delayed processing, and decreased standing balance, hemiplegia, and decreased balance strategies and therefore will continue to benefit from skilled OT intervention to enhance overall performance with BADL and Reduce care partner burden.  Patient progressing toward long term goals..  Continue plan of care.  OT Short Term Goals Week 1:  OT Short Term Goal 1 (Week 1): Patient to perform toilet transfer ambulating with RW with Mod A OT Short Term Goal 1 - Progress (Week 1): Met OT Short Term Goal 2 (Week 1): Patient to perform LB dressing with Mod assist OT Short Term Goal 2 - Progress (Week 1): Met OT Short Term Goal 3 (Week 1): Patient to perform shower transfer with Mod assist OT Short Term Goal 3 - Progress (Week 1): Met OT Short Term Goal 4 (Week 1): Patient to perform grooming Mod I sitting at sink OT Short Term Goal 4 - Progress (Week 1): Met Week 2:  OT Short Term Goal 1 (Week 2): STG=LTGs  Skilled Therapeutic Interventions/Progress Updates:    Patient received seated in wheelchair.  Patient completed shower and dressing earlier this am.  Patient agreeable to OT.  Transported to ADL apartment for tub/shower training with patient and son.  Patient attempted to step over tub when  asked to demonstrate how he would get into tub for shower. Patient has tub transfer bench at home.  Patient unable to lift wither leg high enough in one legged stance to clear tub.  Patien tmade multiple attempts. Demonstrated for patient to sit first then place feet into tub.  Patient able to complete with increased time and verbal cueing.  Son aware.  Worked on sit to stand from low couch.  Patient able to stand if son stabilized the walker x 2.  Walked back to room with son assisting.  Patient and son indicate that patient has rollator at home.  Practiced last third of walk to room with rollator.  Patient actually standing taller and taking larger steps with rollator.  Left up in wheelchair with safety belt in place and engaged and call bell/ personal items in reach - son at bedside.    Therapy Documentation Precautions:  Precautions Precautions: Fall Precaution Comments: L hemi Restrictions Weight Bearing Restrictions: No  Pain: Pain Assessment Pain Scale: Faces Faces Pain Scale: No hurt   Therapy/Group: Individual Therapy  [name] 11/09/2022, 10:10 AM

## 2022-11-09 NOTE — Progress Notes (Signed)
Occupational Therapy Session Note  Patient Details  Name: James Henderson MRN: [id number] Date of Birth: 06/05/1950  Today's Date: 11/09/2022 OT Individual Time: 7829-5621 OT Individual Time Calculation (min): 55 min    Short Term Goals: Week 2:  OT Short Term Goal 1 (Week 2): STG=LTGs  Skilled Therapeutic Interventions/Progress Updates:  Pt greeted supine in bed, pt agreeable to OT intervention. Session focus on BADL reeducation, functional mobility, dynamic standing balance and decreasing overall caregiver burden.      Pt completed supine>sit with light MIN A to elevate trunk and scoot hips to EOB. Pt completed functional mobility into bathroom with pt standing to urinate needing MIN A for clothing mgmt during toilet. Pt entered walkin shower with use of grab bars with MIN A and MOD verbal cues for sequencing and hand placement. Pt completed bathing seated on shower seat with MINA able to use grab bars to stand for LB bathing, did ask for assistance to rinse buttock in standing.   Verbal cues needed for safety such as placement of shower head but good use of LUE functionally during bathing tasks.        Pt exited shower via stand pivot to w/c with MIN A but MOD verbal cues for sequencing and hand/feet placement. Pt completed dressing from w/c with set- up for UB dressing and MIN A for LB dressing needing assistance to orient LB clothing and thread LLE into pants. Pt donned socks via figure with MIN A for R sock.   Pt completed oral care at sink  MODI. Set pt up for breakfast.      Ended session with pt seated in w/c with all needs within reach, eating breakfast and safety belt alarm activated.                   Therapy Documentation Precautions:  Precautions Precautions: Fall Precaution Comments: L hemi Restrictions Weight Bearing Restrictions: No  Pain: no pain     Therapy/Group: Individual Therapy  Barron Schmid 11/09/2022, 8:31 AM

## 2022-11-10 DIAGNOSIS — I6350 Cerebral infarction due to unspecified occlusion or stenosis of unspecified cerebral artery: Secondary | ICD-10-CM | POA: Diagnosis not present

## 2022-11-10 LAB — GLUCOSE, CAPILLARY
Glucose-Capillary: 115 mg/dL — ABNORMAL HIGH (ref 70–99)
Glucose-Capillary: 101 mg/dL — ABNORMAL HIGH (ref 70–99)
Glucose-Capillary: 103 mg/dL — ABNORMAL HIGH (ref 70–99)
Glucose-Capillary: 98 mg/dL (ref 70–99)

## 2022-11-10 NOTE — Progress Notes (Signed)
Physical Therapy Session Note  Patient Details  Name: James Henderson MRN: [id number] Date of Birth: 1950-07-12  Today's Date: 11/10/2022 PT Individual Time: 0920-1000 PT Individual Time Calculation (min): 40 min   Short Term Goals: Week 1:  PT Short Term Goal 1 (Week 1): Pt will navigate 6" stairs with assist PT Short Term Goal 1 - Progress (Week 1): Met PT Short Term Goal 2 (Week 1): Pt will perform STS with min A consistently PT Short Term Goal 2 - Progress (Week 1): Met PT Short Term Goal 3 (Week 1): Pt will ambulate 100 ft with assist PT Short Term Goal 3 - Progress (Week 1): Met Week 2:  PT Short Term Goal 1 (Week 2): STG = LTG  Skilled Therapeutic Interventions/Progress Updates:  Patient supine in bed on entrance to room. Patient alert and agreeable to PT session.   Patient with no pain complaint at start of session.  Therapeutic Activity: Bed Mobility: Pt initiated supine > sit with supervision but then requires light minA to complete. Time required to find midline and scoot forward to EOB. VC/ tc required for technique. At end of session, pt returns to supine with vc required to initiate and completes with supervision. Light MinA for effective scoot toward HOB once in hooklying position and pushing with BLE.  Transfers: Pt performed sit<>stand and stand pivot transfers throughout session with increased effort and CGA. Provided verbal cues for technique.  Gait Training:  Pt relates need to urinate and performs ambulatory transfer to toilet using rollator with CGA. Assist provided in maintaining bathroom door open. Pt ambulated short household distances to/ from bathroom. Is able to continently void in toilet, however has already soiled brief. Is able to maintain stance at toilet for MaxA to don new brief. ModA for clothing mgmt.   Following seated rest break, pt is able to ambulate into hallway, reaches 75 ft distance and then turns to return to room using rollator with improved  performance/ balance from previous session yesterday. Demonstrated more upright posture throughout. Provided vc/ tc for rollator mgmt as pt tends to turn rollator first, leaving BLE outside of BOS.   Continued slow pace and endorses slow pace pta.   Patient supine in bed at end of session with brakes locked, bed alarm set, and all needs within reach. Initially relates feeling fatigued and unsure of participation in next session. With brief rest, relates feeling better and will be able to participate.    Therapy Documentation Precautions:  Precautions Precautions: Fall Precaution Comments: L hemi Restrictions Weight Bearing Restrictions: No  Pain:  No pain related this session.   Therapy/Group: Individual Therapy  Loel Dubonnet PT, DPT, CSRS 11/10/2022, 10:00 AM

## 2022-11-10 NOTE — Progress Notes (Signed)
PROGRESS NOTE   Subjective Complaints:  Pt reports no issues LBM 2 days ago- denies constipation.    ROS: Pt denies SOB, abd pain, CP, N/V/C/D, and vision changes   Objective:   No results found. Recent Labs    11/08/22 0721  WBC 4.2  HGB 12.8*  HCT 38.9*  PLT 219   Recent Labs    11/08/22 0721  NA 135  K 4.7  CL 105  CO2 20*  GLUCOSE 138*  BUN 35*  CREATININE 1.69*  CALCIUM 9.1    Intake/Output Summary (Last 24 hours) at 11/10/2022 1502 Last data filed at 11/10/2022 1318 Gross per 24 hour  Intake 780 ml  Output 200 ml  Net 580 ml        Physical Exam: Vital Signs Blood pressure 111/75, pulse 85, temperature 98.3 F (36.8 C), resp. rate 15, height 5\' 6"  (1.676 m), weight 71.1 kg, SpO2 100%.   General: awake, alert, appropriate, sitting up in bed watching TV; NAD HENT: conjugate gaze; oropharynx moist CV: regular rate and rhythm; no JVD Pulmonary: CTA B/L; no W/R/R- good air movement GI: soft, NT, ND, (+)BS Psychiatric: appropriate Neurological: halting/delayed speech- slowed/word finding issues/expressive aphasia? Musculoskeletal:     Cervical back: Neck supple. No tenderness.     Comments: RUE 5-/5 RLE 5-/5 LUE posturing 4+/5 in biceps, triceps, WE, grip and FA LLE HF 4/5; KE/DF and PF 5-/5  Neurological:     Mental Status: He is alert and oriented to person, place, and time.     Comments: Dysarthric and ataxic speech. Sitting at EOB with good balance. He was able to answer orientation questions and able to follow simple one step commands.  Very dysarthric- hard to understand No clonus; no hoffman's no increased tone B/L Intact to light touch in all  4 extremities  Transferring MinA Psychiatric:     Comments: flat    Assessment/Plan: 1. Functional deficits which require 3+ hours per day of interdisciplinary therapy in a comprehensive inpatient rehab setting. Physiatrist is providing  close team supervision and 24 hour management of active medical problems listed below. Physiatrist and rehab team continue to assess barriers to discharge/monitor patient progress toward functional and medical goals  Care Tool:  Bathing    Body parts bathed by patient: Right arm, Left arm, Chest, Abdomen, Front perineal area, Right upper leg, Left upper leg, Right lower leg, Left lower leg, Buttocks, Face         Bathing assist Assist Level: Contact Guard/Touching assist     Upper Body Dressing/Undressing Upper body dressing   What is the patient wearing?: Pull over shirt    Upper body assist Assist Level: Set up assist    Lower Body Dressing/Undressing Lower body dressing      What is the patient wearing?: Pants, Incontinence brief     Lower body assist Assist for lower body dressing: Minimal Assistance - Patient > 75%     Toileting Toileting    Toileting assist Assist for toileting: Moderate Assistance - Patient 50 - 74% Assistive Device Comment: rollator   Transfers Chair/bed transfer  Transfers assist     Chair/bed transfer assist level: Contact Guard/Touching assist  Locomotion Ambulation   Ambulation assist      Assist level: Minimal Assistance - Patient > 75% Assistive device: Walker-rolling Max distance: 110'   Walk 10 feet activity   Assist     Assist level: Minimal Assistance - Patient > 75% Assistive device: Walker-rolling   Walk 50 feet activity   Assist    Assist level: Minimal Assistance - Patient > 75% Assistive device: Walker-rolling    Walk 150 feet activity   Assist Walk 150 feet activity did not occur: Safety/medical concerns         Walk 10 feet on uneven surface  activity   Assist Walk 10 feet on uneven surfaces activity did not occur: Safety/medical concerns         Wheelchair     Assist Is the patient using a wheelchair?: Yes Type of Wheelchair: Manual    Wheelchair assist level: Moderate  Assistance - Patient 50 - 74% Max wheelchair distance: 100 ft    Wheelchair 50 feet with 2 turns activity    Assist        Assist Level: Moderate Assistance - Patient 50 - 74%   Wheelchair 150 feet activity     Assist      Assist Level: Maximal Assistance - Patient 25 - 49%   Blood pressure 111/75, pulse 85, temperature 98.3 F (36.8 C), resp. rate 15, height 5\' 6"  (1.676 m), weight 71.1 kg, SpO2 100%.  Medical Problem List and Plan: 1. 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  Antithrombotics: -DVT/anticoagulation:  Pharmaceutical: Lovenox             -antiplatelet therapy: ASA/Brilinta X 4 weeks followed by ASA alone.  3. Pain Management:  Tylenol prn.  4. Mood/Behavior/Sleep: LCSW to follow for evaluation and support.              -antipsychotic agents: N/A 5. Neuropsych/cognition: This patient is capable of making decisions on his own behalf. 6. Skin/Wound Care: Routine pressure relief measures 7. Fluids/Electrolytes/Nutrition: Monitor I/O. Check CMET in am.  8. HTN: Monitor BP TID. Continue Amlodipine. Continue to hold Losartan.  9. T2DM: Hgb A1c- 7.2--on glipizide and metformin PTA          Continue metformin             CBG (last 3)  Recent Labs    11/09/22 1642 11/10/22 0632 11/10/22 1122  GLUCAP 110* 115* 101*  Restart CBG checks, d/c ISS  9/21- CBGs well controlled-c on't regimen 10. Acute on chronic renal failure: SCr 1.7 at admission-->baseline likely 1.3-1.4 range             --improved with IVF for hydration and now back to baseline.              --avoid nephrotoxic drugs. Discussed that creatinine is improving    Latest Ref Rng & Units 11/08/2022    7:21 AM 11/07/2022   11:42 AM 11/05/2022    6:14 AM  BMP  Glucose 70 - 99 mg/dL 027  253  664   BUN 8 - 23 mg/dL 35  36  26   Creatinine 0.61  - 1.24 mg/dL 4.03  4.74  1.84   Sodium 135 - 145 mmol/L 135  135  135   Potassium 3.5 - 5.1 mmol/L 4.7  5.0  4.7   Chloride 98 - 111 mmol/L 105  103  102   CO2 22 - 32 mmol/L 20  22  25    Calcium 8.9 - 10.3 mg/dL 9.1  9.6  9.0   Repeat BMP today  9/21- Cr and BUN stable- con't to monitor 11. Dementia: Continue Namenda and Aricept. Delirium precautions.   12. Low HDL:  LDL-58 and at goal. Continue Crestor and Zetia. Continue therapy  13. Hyperactive bladder: continue Tiovaz and Mirabegron.    14. Suboptimal vitamin D level: continue daily D3 supplement  15. Overweight: provide dietary education, BMI reviewed and is trendind downward, continue magnesium gluconate 250mg  HS  15. Tachycardia: start magnesium gluconate 250mg  HS, resolved, continue    16. Constipation: add colace daily  9/21- LBM 2 days ago- denies constipation- if no BM by tomorrow, will intervene  LOS: 9 days A FACE TO FACE EVALUATION WAS PERFORMED  James Henderson 11/10/2022, 3:02 PM

## 2022-11-10 NOTE — Progress Notes (Signed)
Physical Therapy Session Note  Patient Details  Name: James Henderson MRN: [id number] Date of Birth: Dec 28, 1950  Today's Date: 11/10/2022 PT Individual Time: 6962-9528 PT Individual Time Calculation (min): 55 min   Short Term Goals: Week 1:  PT Short Term Goal 1 (Week 1): Pt will navigate 6" stairs with assist PT Short Term Goal 1 - Progress (Week 1): Met PT Short Term Goal 2 (Week 1): Pt will perform STS with min A consistently PT Short Term Goal 2 - Progress (Week 1): Met PT Short Term Goal 3 (Week 1): Pt will ambulate 100 ft with assist PT Short Term Goal 3 - Progress (Week 1): Met Week 2:  PT Short Term Goal 1 (Week 2): STG = LTG  Skilled Therapeutic Interventions/Progress Updates: Patient supine in bed on entrance to room. Patient alert and agreeable to PT session.   Patient reported no pain at beginning of PT session.  PTA switched out rollator due to poor locking mechanism (not able to fix due to cables).   Therapeutic Activity: Bed Mobility: Pt performed supine<>sit on EOB with minA. VC required for use of R UE to assist in pulling self over via HHA or L HOB railing (pt had trouble following cue by grabbing rollator - rollator pushed away at that time). At end of session, pt provided with B UE/LE placement and sequence to scoot/adjust self in bed with supervision and increased time/effort. Transfers: Pt performed sit<>stand transfers throughout session with minA. Provided VC for anterior scoot, and hand placement/sequence with use of rollator as well as ensuring locking mechanism is engaged prior to sitting (pt min/light modA for stand pivot to [facility] and with no AD.  Gait Training:  Pt ambulated 45' x 1 in main gym, and 170' x 1 in main gym and in hallway using rollator with minA throughout first trial, then minA at first on 2nd but required increased assistance towards end due to fatigue. Pt presented with forward flexed posture and required max cues to maintain safe proximity of  rollator towards end of 2nd trial (no cues at first as pt remained close, but as fatigue settled, pt required increased effort to maintain). Pt also with B LE in external rotation (pt reported it was like this prior to admission?). Pt also with downward gaze with mod cues to keep eyes forward ahead for safety awareness. Pt ambulated in main gym posteriorly with modA, and mod/max cues to maintain awareness of posterior lateral lean to the L (PTA provided slack so pt could self assess and adjust accordingly - pt did so twice, but required VC to increase step clearance (L>R) bilaterally vs sliding across floor, and to remain in safe proximity of rollator).   Patient supine in bed at end of session with brakes locked, bed alarm set, and all needs within reach.      Therapy Documentation Precautions:  Precautions Precautions: Fall Precaution Comments: L hemi Restrictions Weight Bearing Restrictions: No  Therapy/Group: Individual Therapy  Maurie Musco PTA 11/10/2022, 12:26 PM

## 2022-11-11 DIAGNOSIS — I6350 Cerebral infarction due to unspecified occlusion or stenosis of unspecified cerebral artery: Secondary | ICD-10-CM | POA: Diagnosis not present

## 2022-11-11 LAB — GLUCOSE, CAPILLARY
Glucose-Capillary: 124 mg/dL — ABNORMAL HIGH (ref 70–99)
Glucose-Capillary: 153 mg/dL — ABNORMAL HIGH (ref 70–99)
Glucose-Capillary: 102 mg/dL — ABNORMAL HIGH (ref 70–99)
Glucose-Capillary: 169 mg/dL — ABNORMAL HIGH (ref 70–99)

## 2022-11-11 NOTE — Progress Notes (Signed)
Occupational Therapy Session Note  Patient Details  Name: James Henderson MRN: [id number] Date of Birth: Sep 12, 1950  Today's Date: 11/11/2022 OT Individual Time: 1345-1425 OT Individual Time Calculation (min): 40 min    Short Term Goals: Week 2:  OT Short Term Goal 1 (Week 2): STG=LTGs  Skilled Therapeutic Interventions/Progress Updates:    Pt resting in bed upon arrival. Pt able to reposition in bed without assistance. Pt had not eaten lunch, stating there is no seasoning. Encouraged pt to eat and pt agreed to eat his fruit. OT intervention with focus on sit<>stand from EOB and standing balance. Pt completed all sit<>stand and standing activities with supervision. Pt able to side step to [facility] before returning to bed with supervision. Pt remained in bed with bed alarm activated. All needs wihtin reach.   Therapy Documentation Precautions:  Precautions Precautions: Fall Precaution Comments: L hemi Restrictions Weight Bearing Restrictions: No    Pain:  Pt denies pain this afternoon  Therapy/Group: Individual Therapy  Rich Brave 11/11/2022, 2:39 PM

## 2022-11-11 NOTE — Progress Notes (Signed)
PROGRESS NOTE   Subjective Complaints:  Pt reports no issues- "busy" on toilet.   ROS: Denies constipation- having BM  Objective:   No results found. No results for input(s): "WBC", "HGB", "HCT", "PLT" in the last 72 hours.  No results for input(s): "NA", "K", "CL", "CO2", "GLUCOSE", "BUN", "CREATININE", "CALCIUM" in the last 72 hours.   Intake/Output Summary (Last 24 hours) at 11/11/2022 1518 Last data filed at 11/10/2022 2130 Gross per 24 hour  Intake --  Output 550 ml  Net -550 ml        Physical Exam: Vital Signs Blood pressure 113/73, pulse 86, temperature 98.2 F (36.8 C), resp. rate 17, height 5\' 6"  (1.676 m), weight 71.1 kg, SpO2 99%.    General: awake, alert, appropriate, NAD HENT: conjugate gaze; oropharynx moist CV: regular rate; no JVD Pulmonary: CTA B/L; no W/R/R- good air movement GI: soft, NT, ND, (+)BS Psychiatric: appropriate Neurological: delayed speech; dysarthric Musculoskeletal:     Cervical back: Neck supple. No tenderness.     Comments: RUE 5-/5 RLE 5-/5 LUE posturing 4+/5 in biceps, triceps, WE, grip and FA LLE HF 4/5; KE/DF and PF 5-/5  Neurological:     Mental Status: He is alert and oriented to person, place, and time.     Comments: Dysarthric and ataxic speech. Sitting at EOB with good balance. He was able to answer orientation questions and able to follow simple one step commands.  Very dysarthric- hard to understand No clonus; no hoffman's no increased tone B/L Intact to light touch in all  4 extremities  Transferring MinA Psychiatric:     Comments: flat    Assessment/Plan: 1. Functional deficits which require 3+ hours per day of interdisciplinary therapy in a comprehensive inpatient rehab setting. Physiatrist is providing close team supervision and 24 hour management of active medical problems listed below. Physiatrist and rehab team continue to assess barriers to  discharge/monitor patient progress toward functional and medical goals  Care Tool:  Bathing    Body parts bathed by patient: Right arm, Left arm, Chest, Abdomen, Front perineal area, Right upper leg, Left upper leg, Right lower leg, Left lower leg, Buttocks, Face         Bathing assist Assist Level: Contact Guard/Touching assist     Upper Body Dressing/Undressing Upper body dressing   What is the patient wearing?: Pull over shirt    Upper body assist Assist Level: Set up assist    Lower Body Dressing/Undressing Lower body dressing      What is the patient wearing?: Pants, Incontinence brief     Lower body assist Assist for lower body dressing: Minimal Assistance - Patient > 75%     Toileting Toileting    Toileting assist Assist for toileting: Moderate Assistance - Patient 50 - 74% Assistive Device Comment: rollator   Transfers Chair/bed transfer  Transfers assist     Chair/bed transfer assist level: Contact Guard/Touching assist     Locomotion Ambulation   Ambulation assist      Assist level: Minimal Assistance - Patient > 75% Assistive device: Walker-rolling Max distance: 110'   Walk 10 feet activity   Assist     Assist level:  Minimal Assistance - Patient > 75% Assistive device: Walker-rolling   Walk 50 feet activity   Assist    Assist level: Minimal Assistance - Patient > 75% Assistive device: Walker-rolling    Walk 150 feet activity   Assist Walk 150 feet activity did not occur: Safety/medical concerns         Walk 10 feet on uneven surface  activity   Assist Walk 10 feet on uneven surfaces activity did not occur: Safety/medical concerns         Wheelchair     Assist Is the patient using a wheelchair?: Yes Type of Wheelchair: Manual    Wheelchair assist level: Moderate Assistance - Patient 50 - 74% Max wheelchair distance: 100 ft    Wheelchair 50 feet with 2 turns activity    Assist        Assist  Level: Moderate Assistance - Patient 50 - 74%   Wheelchair 150 feet activity     Assist      Assist Level: Maximal Assistance - Patient 25 - 49%   Blood pressure 113/73, pulse 86, temperature 98.2 F (36.8 C), resp. rate 17, height 5\' 6"  (1.676 m), weight 71.1 kg, SpO2 99%.  Medical Problem List and Plan: 1. 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  Antithrombotics: -DVT/anticoagulation:  Pharmaceutical: Lovenox             -antiplatelet therapy: ASA/Brilinta X 4 weeks followed by ASA alone.  3. Pain Management:  Tylenol prn.  4. Mood/Behavior/Sleep: LCSW to follow for evaluation and support.              -antipsychotic agents: N/A 5. Neuropsych/cognition: This patient is capable of making decisions on his own behalf. 6. Skin/Wound Care: Routine pressure relief measures 7. Fluids/Electrolytes/Nutrition: Monitor I/O. Check CMET in am.  8. HTN: Monitor BP TID. Continue Amlodipine. Continue to hold Losartan. 9/22- BP controlled con't regimen  9. T2DM: Hgb A1c- 7.2--on glipizide and metformin PTA          Continue metformin             CBG (last 3)  Recent Labs    11/10/22 2051 11/11/22 0619 11/11/22 1139  GLUCAP 98 124* 153*  Restart CBG checks, d/c ISS  9/21-9/22 CBGs well controlled-c on't regimen 10. Acute on chronic renal failure: SCr 1.7 at admission-->baseline likely 1.3-1.4 range             --improved with IVF for hydration and now back to baseline.              --avoid nephrotoxic drugs. Discussed that creatinine is improving    Latest Ref Rng & Units 11/08/2022    7:21 AM 11/07/2022   11:42 AM 11/05/2022    6:14 AM  BMP  Glucose 70 - 99 mg/dL 213  086  578   BUN 8 - 23 mg/dL 35  36  26   Creatinine 0.61 - 1.24 mg/dL 4.69  6.29  5.28   Sodium 135 - 145 mmol/L 135  135  135   Potassium 3.5 - 5.1 mmol/L 4.7  5.0  4.7    Chloride 98 - 111 mmol/L 105  103  102  CO2 22 - 32 mmol/L 20  22  25    Calcium 8.9 - 10.3 mg/dL 9.1  9.6  9.0   Repeat BMP today  9/21- Cr and BUN stable- con't to monitor 11. Dementia: Continue Namenda and Aricept. Delirium precautions.   12. Low HDL:  LDL-58 and at goal. Continue Crestor and Zetia. Continue therapy  13. Hyperactive bladder: continue Tiovaz and Mirabegron.    14. Suboptimal vitamin D level: continue daily D3 supplement  15. Overweight: provide dietary education, BMI reviewed and is trendind downward, continue magnesium gluconate 250mg  HS  15. Tachycardia: start magnesium gluconate 250mg  HS, resolved, continue    16. Constipation: add colace daily  9/21- LBM 2 days ago- denies constipation- if no BM by tomorrow, will intervene  9/22- having BM this AM on toilet  LOS: 10 days A FACE TO FACE EVALUATION WAS PERFORMED  James Henderson 11/11/2022, 3:18 PM

## 2022-11-12 ENCOUNTER — Other Ambulatory Visit (HOSPITAL_COMMUNITY): Payer: Self-pay

## 2022-11-12 DIAGNOSIS — I6350 Cerebral infarction due to unspecified occlusion or stenosis of unspecified cerebral artery: Secondary | ICD-10-CM | POA: Diagnosis not present

## 2022-11-12 LAB — BASIC METABOLIC PANEL
Anion gap: 9 (ref 5–15)
BUN: 29 mg/dL — ABNORMAL HIGH (ref 8–23)
CO2: 23 mmol/L (ref 22–32)
Calcium: 9.2 mg/dL (ref 8.9–10.3)
Chloride: 105 mmol/L (ref 98–111)
Creatinine, Ser: 1.51 mg/dL — ABNORMAL HIGH (ref 0.61–1.24)
GFR, Estimated: 48 mL/min — ABNORMAL LOW (ref >60)
Glucose, Bld: 103 mg/dL — ABNORMAL HIGH (ref 70–99)
Potassium: 4.2 mmol/L (ref 3.5–5.1)
Sodium: 137 mmol/L (ref 135–145)

## 2022-11-12 LAB — CBC
HCT: 37.2 % — ABNORMAL LOW (ref 39.0–52.0)
Hemoglobin: 12.3 g/dL — ABNORMAL LOW (ref 13.0–17.0)
MCH: 30.4 pg (ref 26.0–34.0)
MCHC: 33.1 g/dL (ref 30.0–36.0)
MCV: 92.1 fL (ref 80.0–100.0)
Platelets: 248 10*3/uL (ref 150–400)
RBC: 4.04 MIL/uL — ABNORMAL LOW (ref 4.22–5.81)
RDW: 12.8 % (ref 11.5–15.5)
WBC: 6.7 10*3/uL (ref 4.0–10.5)
nRBC: 0 % (ref 0.0–0.2)

## 2022-11-12 LAB — GLUCOSE, CAPILLARY: Glucose-Capillary: 103 mg/dL — ABNORMAL HIGH (ref 70–99)

## 2022-11-12 MED ORDER — METFORMIN HCL 500 MG PO TABS
500.0000 mg | ORAL_TABLET | Freq: Two times a day (BID) | ORAL | 0 refills | Status: DC
  Filled 2022-11-12: qty 60, 30d supply, fill #0

## 2022-11-12 MED ORDER — L-METHYLFOLATE-B6-B12 3-35-2 MG PO TABS
1.0000 | ORAL_TABLET | Freq: Every day | ORAL | 0 refills | Status: DC
  Filled 2022-11-12: qty 60, 60d supply, fill #0

## 2022-11-12 MED ORDER — MAGNESIUM GLUCONATE 500 MG PO TABS
500.0000 mg | ORAL_TABLET | Freq: Every day | ORAL | Status: DC
  Administered 2022-11-12: 500 mg via ORAL
  Filled 2022-11-12: qty 1

## 2022-11-12 MED ORDER — TICAGRELOR 90 MG PO TABS
90.0000 mg | ORAL_TABLET | Freq: Two times a day (BID) | ORAL | 0 refills | Status: DC
  Filled 2022-11-12: qty 30, 15d supply, fill #0

## 2022-11-12 MED ORDER — DONEPEZIL HCL 10 MG PO TABS
10.0000 mg | ORAL_TABLET | Freq: Every day | ORAL | 0 refills | Status: DC
  Filled 2022-11-12: qty 30, 30d supply, fill #0

## 2022-11-12 MED ORDER — AMLODIPINE BESYLATE 10 MG PO TABS
10.0000 mg | ORAL_TABLET | Freq: Every day | ORAL | 0 refills | Status: DC
  Filled 2022-11-12: qty 30, 30d supply, fill #0

## 2022-11-12 MED ORDER — MEMANTINE HCL 10 MG PO TABS
10.0000 mg | ORAL_TABLET | Freq: Two times a day (BID) | ORAL | 0 refills | Status: DC
  Filled 2022-11-12: qty 60, 30d supply, fill #0

## 2022-11-12 MED ORDER — EZETIMIBE 10 MG PO TABS
10.0000 mg | ORAL_TABLET | Freq: Every day | ORAL | 0 refills | Status: DC
  Filled 2022-11-12: qty 30, 30d supply, fill #0

## 2022-11-12 MED ORDER — ACETAMINOPHEN 325 MG PO TABS: 325.0000 mg | ORAL_TABLET | ORAL | Status: DC | PRN

## 2022-11-12 MED ORDER — ROSUVASTATIN CALCIUM 40 MG PO TABS: 40.0000 mg | ORAL_TABLET | Freq: Every day | ORAL | 0 refills | Status: DC

## 2022-11-12 MED ORDER — MAGNESIUM GLUCONATE 500 MG PO TABS
500.0000 mg | ORAL_TABLET | Freq: Every day | ORAL | 0 refills | Status: DC
  Filled 2022-11-12: qty 30, 30d supply, fill #0

## 2022-11-12 MED ORDER — ASPIRIN 81 MG PO TBEC
81.0000 mg | DELAYED_RELEASE_TABLET | Freq: Every day | ORAL | 0 refills | Status: DC
  Filled 2022-11-12: qty 120, 120d supply, fill #0

## 2022-11-12 MED ORDER — SENNOSIDES-DOCUSATE SODIUM 8.6-50 MG PO TABS: 1.0000 | ORAL_TABLET | Freq: Every evening | ORAL | Status: AC | PRN

## 2022-11-12 NOTE — Progress Notes (Signed)
Physical Therapy Session Note  Patient Details  Name: James Henderson MRN: [id number] Date of Birth: May 17, 1950  Today's Date: 11/12/2022 PT Individual Time: 4098-1191 + 1445-1455 PT Individual Time Calculation (min): 70 min  + 70 min  Short Term Goals: Week 2:  PT Short Term Goal 1 (Week 2): STG = LTG  Skilled Therapeutic Interventions/Progress Updates:      1st session: Direct handoff of care from OT to start with patient sitting in w/c finishing the last few bites of ihs breakfast. Pt has no pain - requests to move up his DC date - team notified as patient is near/at goal level. Pt reports confidence in DC plan, but does want to try to use a SPC today rather than the rollator. Discussed his falls risk and benefits of having 2 hand support for stability has he ambulates - he understands but wants to try the cane anyways.   Transported to main rehab gym and retrieved SPC, adjusted to fit. Patient requiring light minA to stand using SPC - increased instability compared to rollator. Ambulates ~30ft with minA and SPC - gait choppy with inconsistent step lengths, frequent LOB posteriorly, poor stride length, and gait speed very decreased. Switched back to the rollator and ambulated the same distance with CGA to close supervision, improved gait speed, stability, and balance with turns. Gait speed with rollator 0.26 m/s - indicative of household ambulator and increased falls risk.   Stair training completed using 6" using 2 hand rails with CGA for safety - pt navigated x12 steps in total - reciprocal stepping for ascent and step-to pattern for descent.   Pt assisted onto the Nustep with minA stand pivot transfer. SetupA for BLE placement. Completed 5 minutes at L6 resistance, cues for completing full ROM and keeping cadence > 40 steps/minute.   Pt requesting to lie down in his bed due to fatigue - returned to his room and completed stand pivot transfer with no AD and minA for safety. Bed mobility  completed without assist. All needs met, alarm on.   2nd session: Pt resting in bed finishing up his lunch. He reports he's waiting on a phone call from his Son to come pick him up from [facility] - educated him on DC plan tomorrow as his wife has made plans. Pt upset with this and is somewhat difficult to reason with. Able to redirect and get patient to mobilize. He requests to use the bathroom prior to leaving his room.  Bed mobility completed supervision. SetupA for sit<>stand to rollator. + time for processing and motor planning. Pt ambulates with CGA and rollator into the bathroom - unfortunately, has urge incontinence and voids on bathroom floor before getting to the toilet. Returned to EOB and completed LB dressing with assist for new clean pants/brief/socks. Pt requiring more than a reasonable amount of time to complete dressing - pt adamant on wear boxer briefs - suggested depends/briefs to help catch the urine when he has these incontinent episodes.   Pt ambulated from his room to day room rehab gym with close supervision and rollator, intermittent CGA with turns or with busy traffic from staff. Cues for increasing L step length, gait speed, and keeping body within walker frame.   In rehab gym, worked on dynamic standing balance with unsupported weight ball toss, unsupported weighted ball trunk rotations, and repeated sit<>stands while holding weighted ball. (Weighted ball = 6lb).   Pt ambulated back to his room with similar deficits and distance. Remained seated in w/c with  call bell in reach and safety belt alram on.    Therapy Documentation Precautions:  Precautions Precautions: Fall Precaution Comments: L hemi Restrictions Weight Bearing Restrictions: No General:      Therapy/Group: Individual Therapy  Izamar Linden P Aydia Maj  PT, DPT, CSRS  11/12/2022, 7:50 AM

## 2022-11-12 NOTE — Plan of Care (Signed)
at goal level for OT and family expressed no problem taking him earlier. He could possibly move up to tomorrow. OT HH -OR just return to [facility]- No equipment needs (for OT)

## 2022-11-12 NOTE — Progress Notes (Signed)
Occupational Therapy Discharge Summary  Patient Details  Name: James Henderson MRN: [id number] Date of Birth: [birth year]  Date of Discharge from OT service:November 12, 2022  Today's Date: 11/12/2022 OT Individual Time: 0804 - 0900 OT Treatment Time:  56 min    Skilled Therapeutic Intervention:  Patient received supine in bed, agreeable to OT session. Patient able to transition from supine to sit with increased time without use of bedrails - flat bed as he has at home.  Patient walked to bathroom for continent void, then to shower.  Patient using rollator as he used prior to this admission and tends to show safer behavior then with RW.  Patient has shown consistent progress - meeting most goals and is ready/agreeable to discharge slightly earlier than planned originally.  Patient left up in wheelchair for direct hand off to PT.    Patient has met 7 of 8 long term goals due to improved activity tolerance, improved balance, functional use of  LEFT upper and LEFT lower extremity, improved attention, improved awareness, and improved coordination.  Patient to discharge at [facility] Assist level.  Patient's care partner is independent to provide the necessary physical and cognitive assistance at discharge.    Reasons goals not met: Patient required cueing and set up to eat hospital food.  Patient did not like the food or the seasoning.    Recommendation:  Patient will benefit from ongoing skilled OT services in home health setting to continue to advance functional skills in the area of BADL and Reduce care partner burden.  Equipment: No equipment provided  Reasons for discharge: treatment goals met and discharge from hospital  Patient/family agrees with progress made and goals achieved: Yes  OT Discharge Precautions/Restrictions  Precautions Precautions: Fall Precaution Comments: L hemi Restrictions Weight Bearing Restrictions: No   Pain Pain Assessment Pain Score: 0-No  pain ADL ADL Eating: Set up, Supervision/safety Where Assessed-Eating: Chair Grooming: Supervision/safety Where Assessed-Grooming: Sitting at sink Upper Body Bathing: Independent Where Assessed-Upper Body Bathing: Shower Lower Body Bathing: Supervision/safety Where Assessed-Lower Body Bathing: Shower Upper Body Dressing: Independent Where Assessed-Upper Body Dressing: Chair Lower Body Dressing: Supervision/safety Where Assessed-Lower Body Dressing: Chair Toileting: Supervision/safety Where Assessed-Toileting: Teacher, adult education: Close supervision, Furniture conservator/restorer Method: Proofreader: Engineer, technical sales: Close supervison Web designer Method: Ship broker: Insurance underwriter: Close supervision Film/video editor Method: Designer, industrial/product: Sales promotion account executive Baseline Vision/History: 3 Glaucoma;1 Wears glasses Patient Visual Report: No change from baseline Vision Assessment?: Vision impaired- to be further tested in functional context Perception  Perception: Impaired Praxis Praxis: Impaired Praxis Impairment Details: Motor planning Cognition Cognition Overall Cognitive Status: History of cognitive impairments - at baseline Arousal/Alertness: Awake/alert Memory: Impaired Memory Impairment: Decreased recall of new information;Decreased short term memory Decreased Short Term Memory: Verbal basic;Functional basic Attention: Sustained;Focused Focused Attention: Appears intact Sustained Attention: Appears intact Awareness: Impaired Problem Solving Impairment: Functional basic;Verbal basic Executive Function: Decision Making;Self Monitoring;Sequencing Sequencing: Impaired Sequencing Impairment: Functional basic;Verbal basic Decision Making: Impaired Decision Making Impairment: Functional basic Self Monitoring: Impaired Self Monitoring  Impairment: Functional basic;Verbal basic Safety/Judgment: Impaired Brief Interview for Mental Status (BIMS) Repetition of Three Words (First Attempt): 3 Temporal Orientation: Year: No answer Temporal Orientation: Month: Accurate within 5 days Temporal Orientation: Day: Correct Recall: "Sock": Yes, no cue required Recall: "Blue": Yes, no cue required Recall: "Bed": Nonsensical BIMS Summary Score: 10 Sensation Sensation Light Touch: Appears Intact Hot/Cold: Appears Intact Proprioception: Appears Intact Stereognosis:  Appears Intact Coordination Gross Motor Movements are Fluid and Coordinated: No Fine Motor Movements are Fluid and Coordinated: No Coordination and Movement Description: ataxic movement - chronic Motor  Motor Motor: Hemiplegia;Abnormal tone;Ataxia Mobility  Bed Mobility Bed Mobility: Rolling Right Rolling Right: Supervision/verbal cueing Transfers Sit to Stand: Supervision/Verbal cueing  Trunk/Postural Assessment  Cervical Assessment Cervical Assessment: Within Functional Limits Thoracic Assessment Thoracic Assessment: Within Functional Limits Lumbar Assessment Lumbar Assessment: Within Functional Limits Postural Control Postural Control: Deficits on evaluation  Balance Balance Balance Assessed: Yes Static Sitting Balance Static Sitting - Balance Support: No upper extremity supported;Feet supported Static Sitting - Level of Assistance: 7: Independent Dynamic Sitting Balance Dynamic Sitting - Balance Support: No upper extremity supported;Feet supported;During functional activity Dynamic Sitting - Level of Assistance: 5: Stand by assistance Static Standing Balance Static Standing - Balance Support: Right upper extremity supported;Left upper extremity supported;During functional activity Static Standing - Level of Assistance: 5: Stand by assistance Dynamic Standing Balance Dynamic Standing - Balance Support: Right upper extremity supported;Left upper  extremity supported;During functional activity Dynamic Standing - Level of Assistance: 5: Stand by assistance;4: Min assist Extremity/Trunk Assessment RUE Assessment RUE Assessment: Within Functional Limits LUE Assessment LUE Assessment: Exceptions to [facility] General Strength Comments: chronic left hemiplegia   [name] 11/12/2022, 1:00 PM

## 2022-11-12 NOTE — Progress Notes (Signed)
PROGRESS NOTE   Subjective Complaints: No new complaints this morning  Plan for d/c tomorrow, today would not work for his wife.  Labs reviewed and are stable   ROS: Pt denies SOB, abd pain, CP, N/V/C/D, and vision changes   Objective:   No results found. Recent Labs    11/12/22 0608  WBC 6.7  HGB 12.3*  HCT 37.2*  PLT 248   Recent Labs    11/12/22 0608  NA 137  K 4.2  CL 105  CO2 23  GLUCOSE 103*  BUN 29*  CREATININE 1.51*  CALCIUM 9.2    Intake/Output Summary (Last 24 hours) at 11/12/2022 1040 Last data filed at 11/11/2022 2040 Gross per 24 hour  Intake 0 ml  Output 2 ml  Net -2 ml        Physical Exam: Vital Signs Blood pressure (!) 145/79, pulse 70, temperature 98 F (36.7 C), resp. rate 18, height 5\' 6"  (1.676 m), weight 71.1 kg, SpO2 99%.  Gen: no distress, normal appearing HEENT: oral mucosa pink and moist, NCAT Cardio: Reg rate Chest: normal effort, normal rate of breathing Abd: soft, non-distended Ext: no edema Psych: pleasant, normal affect Neurological: halting/delayed speech- slowed/word finding issues/expressive aphasia? Musculoskeletal:     Cervical back: Neck supple. No tenderness.     Comments: RUE 5-/5 RLE 5-/5 LUE posturing 4+/5 in biceps, triceps, WE, grip and FA LLE HF 4/5; KE/DF and PF 5-/5  Neurological:     Mental Status: He is alert and oriented to person, place, and time.     Comments: Dysarthric and ataxic speech. Sitting at EOB with good balance. He was able to answer orientation questions and able to follow simple one step commands.  Very dysarthric- hard to understand No clonus; no hoffman's no increased tone B/L Intact to light touch in all  4 extremities  Transferring MinA Psychiatric:     Comments: flat    Assessment/Plan: 1. Functional deficits which require 3+ hours per day of interdisciplinary therapy in a comprehensive inpatient rehab  setting. Physiatrist is providing close team supervision and 24 hour management of active medical problems listed below. Physiatrist and rehab team continue to assess barriers to discharge/monitor patient progress toward functional and medical goals  Care Tool:  Bathing    Body parts bathed by patient: Right arm, Left arm, Chest, Abdomen, Front perineal area, Right upper leg, Left upper leg, Right lower leg, Left lower leg, Buttocks, Face         Bathing assist Assist Level: Contact Guard/Touching assist     Upper Body Dressing/Undressing Upper body dressing   What is the patient wearing?: Pull over shirt    Upper body assist Assist Level: Set up assist    Lower Body Dressing/Undressing Lower body dressing      What is the patient wearing?: Pants, Incontinence brief     Lower body assist Assist for lower body dressing: Minimal Assistance - Patient > 75%     Toileting Toileting    Toileting assist Assist for toileting: Moderate Assistance - Patient 50 - 74% Assistive Device Comment: rollator   Transfers Chair/bed transfer  Transfers assist     Chair/bed transfer  assist level: Contact Guard/Touching assist     Locomotion Ambulation   Ambulation assist      Assist level: Minimal Assistance - Patient > 75% Assistive device: Walker-rolling Max distance: 110'   Walk 10 feet activity   Assist     Assist level: Minimal Assistance - Patient > 75% Assistive device: Walker-rolling   Walk 50 feet activity   Assist    Assist level: Minimal Assistance - Patient > 75% Assistive device: Walker-rolling    Walk 150 feet activity   Assist Walk 150 feet activity did not occur: Safety/medical concerns         Walk 10 feet on uneven surface  activity   Assist Walk 10 feet on uneven surfaces activity did not occur: Safety/medical concerns         Wheelchair     Assist Is the patient using a wheelchair?: Yes Type of Wheelchair: Manual     Wheelchair assist level: Moderate Assistance - Patient 50 - 74% Max wheelchair distance: 100 ft    Wheelchair 50 feet with 2 turns activity    Assist        Assist Level: Moderate Assistance - Patient 50 - 74%   Wheelchair 150 feet activity     Assist      Assist Level: Maximal Assistance - Patient 25 - 49%   Blood pressure (!) 145/79, pulse 70, temperature 98 F (36.7 C), resp. rate 18, height 5\' 6"  (1.676 m), weight 71.1 kg, SpO2 99%.  Medical Problem List and Plan: 1. 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  Antithrombotics: -DVT/anticoagulation:  Pharmaceutical: Lovenox             -antiplatelet therapy: ASA/Brilinta X 4 weeks followed by ASA alone.  3. Pain Management:  Tylenol prn.  4. Mood/Behavior/Sleep: LCSW to follow for evaluation and support.              -antipsychotic agents: N/A 5. Neuropsych/cognition: This patient is capable of making decisions on his own behalf. 6. Skin/Wound Care: Routine pressure relief measures 7. Fluids/Electrolytes/Nutrition: Monitor I/O. Check CMET in am.  8. HTN: Monitor BP TID. Continue Amlodipine. Continue to hold Losartan.  9. T2DM: Hgb A1c- 7.2--on glipizide and metformin PTA          Continue metformin             CBG (last 3)  Recent Labs    11/11/22 1705 11/11/22 2104 11/12/22 0602  GLUCAP 102* 169* 103*  Restart CBG checks, d/c ISS  9/21- CBGs well controlled-c on't regimen 10. Acute on chronic renal failure: SCr 1.7 at admission-->baseline likely 1.3-1.4 range             --improved with IVF for hydration and now back to baseline.              --avoid nephrotoxic drugs. Discussed that creatinine is improving    Latest Ref Rng & Units 11/12/2022    6:08 AM 11/08/2022    7:21 AM 11/07/2022   11:42 AM  BMP  Glucose 70 - 99 mg/dL 811  914  782   BUN  8 - 23 mg/dL 29  35  36  Creatinine 0.61 - 1.24 mg/dL 0.25  4.27  0.62   Sodium 135 - 145 mmol/L 137  135  135   Potassium 3.5 - 5.1 mmol/L 4.2  4.7  5.0   Chloride 98 - 111 mmol/L 105  105  103   CO2 22 - 32 mmol/L 23  20  22    Calcium 8.9 - 10.3 mg/dL 9.2  9.1  9.6    11. Dementia: Continue Namenda and Aricept. Delirium precautions.   12. Low HDL:  LDL-58 and at goal. Continue Crestor and Zetia. Continue therapy  13. Hyperactive bladder: continue Tiovaz and Mirabegron.    14. Suboptimal vitamin D level: continue daily D3 supplement  15. Overweight: provide dietary education, BMI reviewed and is trendind downward, increase magnesium gluconate to 500mg  HS  15. Tachycardia: increase magnesium gluconate to 500mg  HS, resolved, continue    16. Constipation: resolved, d/c colace  LOS: 11 days A FACE TO FACE EVALUATION WAS PERFORMED  Clint Bolder P Trace Wirick 11/12/2022, 10:40 AM

## 2022-11-12 NOTE — Plan of Care (Signed)
  Problem: RH Balance Goal: LTG Patient will maintain dynamic standing balance (PT) Description: LTG:  Patient will maintain dynamic standing balance with assistance during mobility activities (PT) Outcome: Completed/Met   Problem: Sit to Stand Goal: LTG:  Patient will perform sit to stand with assistance level (PT) Description: LTG:  Patient will perform sit to stand with assistance level (PT) Outcome: Completed/Met   Problem: RH Bed Mobility Goal: LTG Patient will perform bed mobility with assist (PT) Description: LTG: Patient will perform bed mobility with assistance, with/without cues (PT). Outcome: Completed/Met   Problem: RH Bed to Chair Transfers Goal: LTG Patient will perform bed/chair transfers w/assist (PT) Description: LTG: Patient will perform bed to chair transfers with assistance (PT). Outcome: Completed/Met   Problem: RH Car Transfers Goal: LTG Patient will perform car transfers with assist (PT) Description: LTG: Patient will perform car transfers with assistance (PT). Outcome: Completed/Met   Problem: RH Ambulation Goal: LTG Patient will ambulate in controlled environment (PT) Description: LTG: Patient will ambulate in a controlled environment, # of feet with assistance (PT). Outcome: Completed/Met   Problem: RH Wheelchair Mobility Goal: LTG Patient will propel w/c in controlled environment (PT) Description: LTG: Patient will propel wheelchair in controlled environment, # of feet with assist (PT) Outcome: Completed/Met   Problem: RH Stairs Goal: LTG Patient will ambulate up and down stairs w/assist (PT) Description: LTG: Patient will ambulate up and down # of stairs with assistance (PT) Outcome: Completed/Met

## 2022-11-12 NOTE — Discharge Instructions (Addendum)
   Inpatient Rehab Discharge Instructions  James Henderson Discharge date and time:    Activities/Precautions/ Functional Status: Activity: no lifting, driving, or strenuous exercise till cleared by MD Wound Care: none needed   Functional status:  ___ No restrictions     ___ Walk up steps independently _X__ 24/7 supervision/assistance   ___ Walk up steps with assistance ___ Intermittent supervision/assistance  ___ Bathe/dress independently __X_ Walk with walker    ___ Bathe/dress with assistance ___ Walk Independently    ___ Shower independently ___ Walk with assistance    _X__ Shower with assistance _X__ No alcohol     ___ Return to work/school ________  Special Instructions:  COMMUNITY REFERRALS UPON DISCHARGE:    [facility] WILL PROVIDE THE FOLLOW UP THERAPIES   Medical Equipment/Items Ordered:HAS ALL NEEDED EQUIPMENT FROM PAST ADMISSIONS                                                 Agency/Supplier:NA    My questions have been answered and I understand these instructions. I will adhere to these goals and the provided educational materials after my discharge from the hospital.  Patient/Caregiver Signature _______________________________ Date __________  Clinician Signature _______________________________________ Date __________  Please bring this form and your medication list with you to all your follow-up doctor's appointments.

## 2022-11-12 NOTE — Progress Notes (Addendum)
Patient ID: James Henderson, male   DOB: 1950/11/30, 72 y.o.   MRN: [id number] According to team ad MD pt is ready to discharge tomorrow instead of Wednesday. Have spoken with [facility] SW and left message for his wife. [facility] will provide the follow up therapies and no equipment needs. Await return call from wife  8:57 AM Spoke with wife she is in agreement with discharge tomorrow but will not be here until the afternoon tomorrow to pick him up.

## 2022-11-12 NOTE — Progress Notes (Signed)
Inpatient Rehabilitation Care Coordinator Discharge Note   Patient Details  Name: James Henderson MRN: [id number] Date of Birth: 1950/10/02   Discharge location: HOME WITH WIFE AND SON, SON WITH WHILE WIFE WORKS-ALOS [facility] 2X WEEK  Length of Stay: 12 DAYS  Discharge activity level: CGA-SUPERVISION LEVEL  Home/community participation: ACTIVE  Patient response UX:LKGMWN Literacy - How often do you need to have someone help you when you read instructions, pamphlets, or other written material from your doctor or pharmacy?: Always  Patient response UU:VOZDGU Isolation - How often do you feel lonely or isolated from those around you?: Rarely  Services provided included: MD, RD, PT, OT, SLP, RN, CM, TR, Pharmacy, SW  Financial Services:  Field seismologist Utilized: Private Insurance [facility]  Choices offered to/list presented to: [facility] SETS UP AND DECIDES  Follow-up services arranged:  Outpatient, Other (Comment)    Outpatient Servicies: [facility] ARRANGES FOLLOW UP WILL NEED PT OT SP HAS ALL NEEDED EQUIPMENT FROM PAST ADMISSIONS      Patient response to transportation need: Is the patient able to respond to transportation needs?: Yes In the past 12 months, has lack of transportation kept you from medical appointments or from getting medications?: No In the past 12 months, has lack of transportation kept you from meetings, work, or from getting things needed for daily living?: No   Patient/Family verbalized understanding of follow-up arrangements:  Yes  Individual responsible for coordination of the follow-up plan: MARY-WIFE [phone]  Confirmed correct DME delivered: Lucy Chris 11/12/2022    Comments (or additional information):SON WAS HERE DAILY AND PARTICIPATED IN DAD'S CARE. AWARE OF CGA LEVEL. [facility]DS [facility] W[facility] AND THEY MANAGE HIS FOLLOW UP APPOINTMENTS, NEEDS AND CARE  Summary of Stay    Date/Time Discharge Planning CSW  11/07/22 [phone] HOme  with wife and son-wife works but son will be with while she is working. [facility]. RGD       Lucy Chris

## 2022-11-12 NOTE — Progress Notes (Signed)
Physical Therapy Discharge Summary  Patient Details  Name: James Henderson MRN: [id number] Date of Birth: 1950/05/15  Date of Discharge from PT service:November 12, 2022  Patient has met 8 of 8 long term goals due to improved activity tolerance, improved balance, improved postural control, increased strength, ability to compensate for deficits, functional use of  left upper extremity and left lower extremity, improved attention, and improved awareness.  Patient to discharge at an ambulatory level  CGA .   Patient's care partner is independent to provide the necessary physical and cognitive assistance at discharge.  Reasons goals not met: n/a  Recommendation:  Patient will benefit from ongoing skilled PT services in home health setting to continue to advance safe functional mobility, address ongoing impairments in L sided weakness, home safety, caregiver training, and minimize fall risk.  Equipment: No equipment provided  Reasons for discharge: treatment goals met and discharge from hospital  Patient/family agrees with progress made and goals achieved: Yes  PT Discharge Precautions/Restrictions Precautions Precautions: Fall Precaution Comments: L hemi Restrictions Weight Bearing Restrictions: No Pain Pain Assessment Pain Scale: 0-10 Pain Score: 0-No pain Pain Interference Pain Interference Pain Effect on Sleep: 0. Does not apply - I have not had any pain or hurting in the past 5 days Pain Interference with Therapy Activities: 0. Does not apply - I have not received rehabilitationtherapy in the past 5 days Pain Interference with Day-to-Day Activities: 1. Rarely or not at all Vision/Perception  Vision - History Ability to See in Adequate Light: 1 Impaired Perception Perception: Impaired Preception Impairment Details: Inattention/Neglect Praxis Praxis: Impaired Praxis Impairment Details: Motor planning;Initiation  Cognition Overall Cognitive Status: History of cognitive  impairments - at baseline Arousal/Alertness: Awake/alert Orientation Level: Oriented to person;Oriented to place;Oriented to situation Attention: Sustained;Focused Focused Attention: Appears intact Sustained Attention: Appears intact Memory: Impaired Memory Impairment: Decreased recall of new information;Decreased short term memory Decreased Short Term Memory: Verbal basic;Functional basic Awareness: Impaired Awareness Impairment: Emergent impairment Problem Solving: Impaired Problem Solving Impairment: Functional basic;Verbal basic Executive Function: Decision Making;Self Monitoring;Sequencing Sequencing: Impaired Sequencing Impairment: Functional basic;Verbal basic Decision Making: Impaired Decision Making Impairment: Verbal basic Self Monitoring: Impaired Self Monitoring Impairment: Functional basic;Verbal basic Safety/Judgment: Impaired Sensation Sensation Light Touch: Appears Intact Hot/Cold: Appears Intact Proprioception: Appears Intact Stereognosis: Appears Intact Coordination Gross Motor Movements are Fluid and Coordinated: No Fine Motor Movements are Fluid and Coordinated: No Coordination and Movement Description: ataxic movement - chronic Motor  Motor Motor: Hemiplegia;Abnormal tone;Ataxia  Mobility Bed Mobility Bed Mobility: Supine to Sit;Sit to Supine Rolling Right: Supervision/verbal cueing Supine to Sit: Supervision/Verbal cueing Sit to Supine: Supervision/Verbal cueing Transfers Transfers: Sit to Stand;Stand Pivot Transfers Sit to Stand: Supervision/Verbal cueing Stand Pivot Transfers: Supervision/Verbal cueing Stand Pivot Transfer Details: Verbal cues for safe use of DME/AE;Verbal cues for technique;Verbal cues for sequencing;Verbal cues for precautions/safety Transfer (Assistive device): Rollator Locomotion  Gait Ambulation: Yes Gait Assistance: Contact Guard/Touching assist Gait Distance (Feet): 150 Feet Assistive device: Rollator Gait Gait:  Yes Gait Pattern: Impaired Gait Pattern: Decreased weight shift to right;Trunk flexed;Left foot flat;Decreased hip/knee flexion - left;Decreased dorsiflexion - left;Wide base of support;Ataxic Gait velocity: 0.26 m/s Stairs / Additional Locomotion Stairs: Yes Stairs Assistance: Contact Guard/Touching assist Stair Management Technique: Two rails Number of Stairs: 12 Height of Stairs: 6 Ramp: Contact Guard/touching assist Pick up small object from the floor assist level: Minimal Assistance - Patient > 75% Wheelchair Mobility Wheelchair Mobility: No  Trunk/Postural Assessment  Cervical Assessment Cervical Assessment: Within Functional Limits Thoracic Assessment Thoracic Assessment:  Within Functional Limits Lumbar Assessment Lumbar Assessment: Within Functional Limits Postural Control Postural Control: Deficits on evaluation Trunk Control: posterior bias  Balance Balance Balance Assessed: Yes Static Sitting Balance Static Sitting - Balance Support: No upper extremity supported;Feet supported Static Sitting - Level of Assistance: 7: Independent Dynamic Sitting Balance Dynamic Sitting - Balance Support: No upper extremity supported;Feet supported;During functional activity Dynamic Sitting - Level of Assistance: 5: Stand by assistance Static Standing Balance Static Standing - Balance Support: Right upper extremity supported;Left upper extremity supported;During functional activity Static Standing - Level of Assistance: 5: Stand by assistance Dynamic Standing Balance Dynamic Standing - Balance Support: Right upper extremity supported;Left upper extremity supported;During functional activity Dynamic Standing - Level of Assistance: 5: Stand by assistance;4: Min assist Extremity Assessment  RUE Assessment RUE Assessment: Within Functional Limits LUE Assessment LUE Assessment: Exceptions to [facility] General Strength Comments: chronic left hemiplegia RLE Assessment RLE Assessment:  Exceptions to [facility] General Strength Comments: Grossly 4/5 LLE Assessment LLE Assessment: Exceptions to [facility] General Strength Comments: Grossly 4-/5   James Henderson P James Oppedisano  PT, DPT, CSRS  11/12/2022, 2:32 PM

## 2022-11-13 MED ORDER — METFORMIN HCL 1000 MG PO TABS: 500.0000 mg | ORAL_TABLET | Freq: Two times a day (BID) | ORAL | Status: AC

## 2022-11-13 MED ORDER — L-METHYLFOLATE-B6-B12 3-35-2 MG PO TABS: 1.0000 | ORAL_TABLET | Freq: Every day | ORAL | 0 refills | Status: AC

## 2022-11-13 MED ORDER — MEMANTINE HCL 10 MG PO TABS: 10.0000 mg | ORAL_TABLET | Freq: Two times a day (BID) | ORAL | Status: AC

## 2022-11-13 MED ORDER — ASPIRIN 81 MG PO TBEC: 81.0000 mg | DELAYED_RELEASE_TABLET | Freq: Every day | ORAL | 12 refills | Status: AC

## 2022-11-13 MED ORDER — ACETAMINOPHEN 325 MG PO TABS: 325.0000 mg | ORAL_TABLET | ORAL | 0 refills | Status: AC | PRN

## 2022-11-13 MED ORDER — EZETIMIBE 10 MG PO TABS: 10.0000 mg | ORAL_TABLET | Freq: Every day | ORAL | Status: AC

## 2022-11-13 MED ORDER — BRINZOLAMIDE 1 % OP SUSP: 1.0000 [drp] | Freq: Every day | OPHTHALMIC | Status: AC

## 2022-11-13 MED ORDER — MAGNESIUM GLUCONATE 500 MG PO TABS: 500.0000 mg | ORAL_TABLET | Freq: Every day | ORAL | 0 refills | Status: AC

## 2022-11-13 MED ORDER — TICAGRELOR 90 MG PO TABS: 90.0000 mg | ORAL_TABLET | Freq: Two times a day (BID) | ORAL | 0 refills | Status: AC

## 2022-11-13 MED ORDER — ROSUVASTATIN CALCIUM 40 MG PO TABS: 40.0000 mg | ORAL_TABLET | Freq: Every day | ORAL | Status: AC

## 2022-11-13 MED ORDER — DONEPEZIL HCL 10 MG PO TABS: 10.0000 mg | ORAL_TABLET | Freq: Every day | ORAL | Status: AC

## 2022-11-13 MED ORDER — BRIMONIDINE TARTRATE 0.2 % OP SOLN: 1.0000 [drp] | Freq: Every day | OPHTHALMIC | Status: AC

## 2022-11-13 MED ORDER — AMLODIPINE BESYLATE 10 MG PO TABS: 10.0000 mg | ORAL_TABLET | Freq: Every day | ORAL | Status: AC

## 2022-11-13 MED ORDER — TICAGRELOR 90 MG PO TABS
90.0000 mg | ORAL_TABLET | Freq: Once | ORAL | Status: AC
  Administered 2022-11-13: 90 mg via ORAL

## 2022-11-13 NOTE — Progress Notes (Signed)
PROGRESS NOTE   Subjective Complaints: Ready ford d/c today Patient's chart reviewed- No issues reported overnight Vitals signs stable    ROS: Pt denies SOB, abd pain, CP, N/V/C/D, and vision changes   Objective:   No results found. Recent Labs    11/12/22 0608  WBC 6.7  HGB 12.3*  HCT 37.2*  PLT 248   Recent Labs    11/12/22 0608  NA 137  K 4.2  CL 105  CO2 23  GLUCOSE 103*  BUN 29*  CREATININE 1.51*  CALCIUM 9.2    Intake/Output Summary (Last 24 hours) at 11/13/2022 1206 Last data filed at 11/13/2022 0753 Gross per 24 hour  Intake 720 ml  Output --  Net 720 ml        Physical Exam: Vital Signs Blood pressure 125/77, pulse 80, temperature (!) 97.4 F (36.3 C), resp. rate 16, height 5\' 6"  (1.676 m), weight 71.1 kg, SpO2 100%.  Gen: no distress, normal appearing HEENT: oral mucosa pink and moist, NCAT Cardio: Reg rate Chest: normal effort, normal rate of breathing Abd: soft, non-distended Ext: no edema Psych: pleasant, normal affect Neurological: halting/delayed speech- slowed/word finding issues/expressive aphasia? Musculoskeletal:     Cervical back: Neck supple. No tenderness.     Comments: RUE 5-/5 RLE 5-/5 LUE posturing 4+/5 in biceps, triceps, WE, grip and FA LLE HF 4/5; KE/DF and PF 5-/5  Neurological:     Mental Status: He is alert and oriented to person, place, and time.     Comments: Dysarthric and ataxic speech. Sitting at EOB with good balance. He was able to answer orientation questions and able to follow simple one step commands.  Very dysarthric- hard to understand No clonus; no hoffman's no increased tone B/L Intact to light touch in all  4 extremities  Transferring MinA Psychiatric:     Comments: flat    Assessment/Plan: 1. Functional deficits which require 3+ hours per day of interdisciplinary therapy in a comprehensive inpatient rehab setting. Physiatrist is  providing close team supervision and 24 hour management of active medical problems listed below. Physiatrist and rehab team continue to assess barriers to discharge/monitor patient progress toward functional and medical goals  Care Tool:  Bathing    Body parts bathed by patient: Right arm, Left arm, Chest, Abdomen, Front perineal area, Right upper leg, Left upper leg, Right lower leg, Left lower leg, Buttocks, Face         Bathing assist Assist Level: Supervision/Verbal cueing     Upper Body Dressing/Undressing Upper body dressing   What is the patient wearing?: Pull over shirt    Upper body assist Assist Level: Independent    Lower Body Dressing/Undressing Lower body dressing      What is the patient wearing?: Incontinence brief, Underwear/pull up     Lower body assist Assist for lower body dressing: Supervision/Verbal cueing     Toileting Toileting    Toileting assist Assist for toileting: Contact Guard/Touching assist Assistive Device Comment: rollator   Transfers Chair/bed transfer  Transfers assist     Chair/bed transfer assist level: Supervision/Verbal cueing     Locomotion Ambulation   Ambulation assist  Assist level: Contact Guard/Touching assist Assistive device: Rollator Max distance: 138ft   Walk 10 feet activity   Assist     Assist level: Contact Guard/Touching assist Assistive device: Rollator   Walk 50 feet activity   Assist    Assist level: Contact Guard/Touching assist Assistive device: Rollator    Walk 150 feet activity   Assist Walk 150 feet activity did not occur: Safety/medical concerns  Assist level: Contact Guard/Touching assist Assistive device: Rollator    Walk 10 feet on uneven surface  activity   Assist Walk 10 feet on uneven surfaces activity did not occur: Safety/medical concerns   Assist level: Minimal Assistance - Patient > 75% Assistive device: Rollator   Wheelchair     Assist Is the  patient using a wheelchair?: No Type of Wheelchair: Manual    Wheelchair assist level: Moderate Assistance - Patient 50 - 74% Max wheelchair distance: 100 ft    Wheelchair 50 feet with 2 turns activity    Assist        Assist Level: Moderate Assistance - Patient 50 - 74%   Wheelchair 150 feet activity     Assist      Assist Level: Maximal Assistance - Patient 25 - 49%   Blood pressure 125/77, pulse 80, temperature (!) 97.4 F (36.3 C), resp. rate 16, height 5\' 6"  (1.676 m), weight 71.1 kg, SpO2 100%.  Medical Problem List and Plan: 1. 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  Antithrombotics: -DVT/anticoagulation:  Pharmaceutical: Lovenox             -antiplatelet therapy: ASA/Brilinta X 4 weeks followed by ASA alone.  3. Pain Management:  Tylenol prn.  4. Mood/Behavior/Sleep: LCSW to follow for evaluation and support.              -antipsychotic agents: N/A 5. Neuropsych/cognition: This patient is capable of making decisions on his own behalf. 6. Skin/Wound Care: Routine pressure relief measures 7. Fluids/Electrolytes/Nutrition: Monitor I/O. Check CMET in am.  8. HTN: Monitor BP TID. Continue Amlodipine. Continue to hold Losartan.  9. T2DM: Hgb A1c- 7.2--on glipizide and metformin PTA          Continue metformin             CBG (last 3)  Recent Labs    11/11/22 1705 11/11/22 2104 11/12/22 0602  GLUCAP 102* 169* 103*  Restart CBG checks, d/c ISS  9/21- CBGs well controlled-c on't regimen 10. Acute on chronic renal failure: SCr 1.7 at admission-->baseline likely 1.3-1.4 range             --improved with IVF for hydration and now back to baseline.              --avoid nephrotoxic drugs. Discussed that creatinine is improving    Latest Ref Rng & Units 11/12/2022    6:08 AM 11/08/2022    7:21 AM  11/07/2022   11:42 AM  BMP  Glucose 70 - 99 mg/dL 161  096  045   BUN 8 - 23 mg/dL 29  35  36   Creatinine 0.61 - 1.24 mg/dL 4.09  8.11  9.14   Sodium 135 - 145 mmol/L  137  135  135   Potassium 3.5 - 5.1 mmol/L 4.2  4.7  5.0   Chloride 98 - 111 mmol/L 105  105  103   CO2 22 - 32 mmol/L 23  20  22    Calcium 8.9 - 10.3 mg/dL 9.2  9.1  9.6    11. Dementia: Continue Namenda and Aricept. Delirium precautions.   12. Low HDL:  LDL-58 and at goal. Continue Crestor and Zetia. Continue therapy  13. Hyperactive bladder: continue Tiovaz and Mirabegron.    14. Suboptimal vitamin D level: continue daily D3 supplement  15. Overweight: provide dietary education, BMI reviewed and is trendind downward, increase magnesium gluconate to 500mg  HS  15. Tachycardia: increase magnesium gluconate to 500mg  HS, resolved, continue   16. Constipation: continue magnesium, d/c colace   >30 minutes spent in discharge of patient including review of medications and follow-up appointments, physical examination, and in answering all patient's questions   LOS: 12 days A FACE TO FACE EVALUATION WAS PERFORMED  Clint Bolder P Guthrie Lemme 11/13/2022, 12:06 PM

## 2022-11-13 NOTE — Progress Notes (Addendum)
Inpatient Rehabilitation Discharge Medication Review by a Pharmacist  A complete drug regimen review was completed for this patient to identify any potential clinically significant medication issues.  High Risk Drug Classes Is patient taking? Indication by Medication  Antipsychotic No   Anticoagulant No   Antibiotic No   Opioid No   Antiplatelet Yes Aspirin 81 mg, ticagrelor - CVA prophylaxis  Hypoglycemics/insulin Yes Metformin - DM type 2  Vasoactive Medication Yes Amlodipine - hypertension  Chemotherapy No   Other Yes Donepezil, memantine - dementia Rosuvastatin, ezetimibe - hyperlipidemia Brinzolamide, brimonidine, Timolol eye drops - glaucoma Magnesium gluconate - tachycardia, constipation Metanx, Vitamin D - supplements Senna-docusate - laxatives  PRNs: Acetaminophen - mild pain Senna-docusate - mild constipation     Type of Medication Issue Identified Description of Issue Recommendation(s)  Drug Interaction(s) (clinically significant)     Duplicate Therapy  Brinzolamide-brimonidine combination product discontinued. Prescribed as individual components.  Allergy     No Medication Administration End Date     Incorrect Dose     Additional Drug Therapy Needed     Significant med changes from prior encounter (inform family/care partners about these prior to discharge). Losartan/ Hyzaar 100-25, glipizide, fish oil, inositol niacinate, fesoterodine, vibegron and docusate discontinued. Also prn Diclofenac gel and senna.  Clopidogrel changed to Ticagrelor.  New Metanx, Magnesium gluconate.  Communicate changes to patient/family prior to discharge.    Other  Formulary substitutions while inpatient: - fesoterodine for tolterodine - mirabegron for vibegron Had planned back to home medications at discharge, but both medications now discontinued based on updated medication list provided by [facility].    Clinically significant medication issues were identified that  warrant physician communication and completion of prescribed/recommended actions by midnight of the next day:  No  Pharmacist comments:  - Clopidogrel changed to Ticagrelor on 10/31/22.  Plan Aspirin and Ticagrelor for 4 weeks, then Aspirin 81 mg daily alone. Ticagrelor stop date in place.   Addendum ~12n:  Delle Reining, PA-C notified me of several medication changes, based on an updated list provided by [facility], who arrange outpatient medications.    - Added Timolol eye drops, which will continue.    - Added Jardiance, Hyzaar 100-25 (rather than losartan), docusate hs, prn Senna, none of which will continue.  - removed glipizide, reported no longer prescribed  - reported vibegron and tolterodine not prescribed, and both have been discontinued  Time spent performing this drug regimen review (minutes):  [address]   Dennie Fetters, [place] 11/13/2022 9:33 AM Addendum: 12:03 PM   Addendum:  Delle Reining, PA-C notified me of several medication changes, based on an updated list provided by [facility], who arrange outpatient medications.

## 2022-11-14 NOTE — Discharge Summary (Signed)
Physician Discharge Summary  Patient ID: James Henderson MRN: [id number] DOB/AGE: 74-Dec-1952 74 y.o.  Admit date: 11/01/2022 Discharge date: 11/13/2022  Discharge Diagnoses:  Principal Problem:   Posterior circulation stroke [facility]) Active Problems:   Unspecified glaucoma   Essential hypertension   DM (diabetes mellitus) type II controlled, neurological manifestation (HCC)   CKD stage 3a, GFR 45-59 ml/min (HCC)   Left-sided weakness   Unsteady gait   Mixed Alzheimer's and vascular dementia (HCC)   Discharged Condition: stable  Significant Diagnostic Studies: N/A   Labs:  Basic Metabolic Panel:    Latest Ref Rng & Units 11/12/2022    6:08 AM 11/08/2022    7:21 AM 11/07/2022   11:42 AM  BMP  Glucose 70 - 99 mg/dL 409  811  914   BUN 8 - 23 mg/dL 29  35  36   Creatinine 0.61 - 1.24 mg/dL 7.82  9.56  2.13   Sodium 135 - 145 mmol/L 137  135  135   Potassium 3.5 - 5.1 mmol/L 4.2  4.7  5.0   Chloride 98 - 111 mmol/L 105  105  103   CO2 22 - 32 mmol/L 23  20  22    Calcium 8.9 - 10.3 mg/dL 9.2  9.1  9.6      CBC:    Latest Ref Rng & Units 11/12/2022    6:08 AM 11/08/2022    7:21 AM 11/05/2022    6:14 AM  CBC  WBC 4.0 - 10.5 K/uL 6.7  4.2  4.0   Hemoglobin 13.0 - 17.0 g/dL 08.6  57.8  46.9   Hematocrit 39.0 - 52.0 % 37.2  38.9  39.5   Platelets 150 - 400 K/uL 248  219  243      CBG: Recent Labs  Lab 11/11/22 0619 11/11/22 1139 11/11/22 1705 11/11/22 2104 11/12/22 0602  GLUCAP 124* 153* 102* 169* 103*    Brief HPI:   James Henderson is a 72 y.o. male with history of T2DM, HTN, meningioma, dementia, prior brainstem stroke with residual left-sided weakness and aphasia who was admitted on 10/29/2022 with 2-day history of increasing left-sided weakness as well as increasing falls.  He was found to have a small acute infarct right mid brain with general volume loss and unchanged small meningioma.  He felt the stroke was due to small vessel disease and recommended aspirin and  Brilinta x 4 weeks followed by aspirin alone.  At baseline patient went to [facility] couple days a week but was able to ambulate with walker and family providing supervision.  Therapy was working with patient who was limited by weakness and was requiring min assist overall.  CIR was recommended due to functional decline.   Hospital Course: James Henderson was admitted to rehab 11/01/2022 for inpatient therapies to consist of PT and OT at least three hours five days a week. Past admission physiatrist, therapy team and rehab RN have worked together to provide customized collaborative inpatient rehab.  His blood pressures were monitored on TID basis and was controlled on amlodipine alone.  Metformin was resumed as p.o. intake was noted to be stable.  His diabetes has been monitored with ac/hs CBG checks and SSI was use prn for tighter BS control.  Blood sugars are reasonably controlled on metformin alone.  Her acute on chronic renal failure has steadily improved with increasing fluid intake.  He is tolerating DAPT of CBC showed H&H with platelets to be stable.  He has made gains during his  stay and is currently at min assist overall.  He will continue to receive follow-up PT and OT at [facility] after discharge.   Rehab course: During patient's stay in rehab weekly team conferences were held to monitor patient's progress, set goals and discuss barriers to discharge. At admission, patient required max assist with ADL task and mod assist with mobility. He exhibited moderate mixed dysarthria with slow effortful speech, was oriented x 4 and presented with cognitive deficits which family felt was at baseline.  No speech therapy warranted as cognitive-linguistic deficits were reported to be at baseline.  He  has had improvement in activity tolerance, balance, postural control as well as ability to compensate for deficits.  He is able to complete ADL tasks with min assist.  He requires supervision for transfers and contact-guard  assist to ambulate 150 feet with rollator.  Family education has been completed.  Discharge disposition: 06-Home-Health Care Svc  Diet: Heart healthy carb modified.  Special Instructions: Family to assist with medication management.  Discharge Instructions     Ambulatory referral to Neurology   Complete by: As directed    An appointment is requested in approximately: 4 weeks   Ambulatory referral to Physical Medicine Rehab   Complete by: As directed       Allergies as of 11/13/2022   No Known Allergies      Medication List     STOP taking these medications    Brinzolamide-Brimonidine 1-0.2 % Susp   clopidogrel 75 MG tablet Commonly known as: PLAVIX   diclofenac Sodium 1 % Gel Commonly known as: VOLTAREN   docusate sodium 100 MG capsule Commonly known as: COLACE   Fish Oil 500 MG Caps   Gemtesa 75 MG Tabs Generic drug: Vibegron   glipiZIDE 5 MG tablet Commonly known as: GLUCOTROL   Inositol Niacinate 500 MG Caps   Jardiance 10 MG Tabs tablet Generic drug: empagliflozin   losartan 100 MG tablet Commonly known as: COZAAR   losartan-hydrochlorothiazide 100-25 MG tablet Commonly known as: HYZAAR   senna 8.6 MG Tabs tablet Commonly known as: SENOKOT   tolterodine 4 MG 24 hr capsule Commonly known as: DETROL LA       TAKE these medications    acetaminophen 325 MG tablet Commonly known as: TYLENOL Take 1-2 tablets (325-650 mg total) by mouth every 4 (four) hours as needed for mild pain. What changed:  how much to take reasons to take this   amLODipine 10 MG tablet Commonly known as: NORVASC Take 1 tablet (10 mg total) by mouth daily. What changed: how much to take   aspirin EC 81 MG tablet Take 1 tablet (81 mg total) by mouth daily. Swallow whole.   brimonidine 0.2 % ophthalmic solution Commonly known as: ALPHAGAN Place 1 drop into both eyes at bedtime.   brinzolamide 1 % ophthalmic suspension Commonly known as: AZOPT Place 1 drop into  both eyes at bedtime.   donepezil 10 MG tablet Commonly known as: ARICEPT Take 1 tablet (10 mg total) by mouth at bedtime. What changed: See the new instructions.   ezetimibe 10 MG tablet Commonly known as: ZETIA Take 1 tablet (10 mg total) by mouth daily.   l-methylfolate-B6-B12 3-35-2 MG Tabs tablet Commonly known as: METANX Take 1 tablet by mouth daily.   magnesium gluconate 500 MG tablet Commonly known as: MAGONATE Take 1 tablet (500 mg total) by mouth at bedtime.   memantine 10 MG tablet Commonly known as: NAMENDA Take 1 tablet (10 mg total)  by mouth 2 (two) times daily.   metFORMIN 1000 MG tablet Commonly known as: GLUCOPHAGE Take 0.5 tablets (500 mg total) by mouth 2 (two) times daily with a meal. What changed:  how much to take when to take this   rosuvastatin 40 MG tablet Commonly known as: CRESTOR Take 1 tablet (40 mg total) by mouth daily at 6 PM. What changed: when to take this   senna-docusate 8.6-50 MG tablet Commonly known as: Senokot-S Take 1 tablet by mouth at bedtime as needed for mild constipation.   ticagrelor 90 MG Tabs tablet Commonly known as: BRILINTA Take 1 tablet (90 mg total) by mouth 2 (two) times daily.   timolol 0.5 % ophthalmic solution Commonly known as: TIMOPTIC Place 1 drop into both eyes at bedtime.   Vitamin D-3 125 MCG (5000 UT) Tabs Take 5,000 Units by mouth daily.        Follow-up [facility], [address] [facility] Follow up.   Why: Call in 1-2 days for post hospital follow up Contact information: [address] [place] [address] [phone]         Horton Chin, MD Follow up.   Specialty: Physical Medicine and Rehabilitation Why: office will call you with follow up appointment Contact information: [address] [place] [address] [phone]         [facility] Follow up.   Why: office will call you with follow up  appointment Contact information: [address]     [address] [place] [address] [phone]                Signed: Jacquelynn Cree 11/14/2022, 6:30 PM

## 2022-11-26 ENCOUNTER — Encounter
Payer: Medicare (Managed Care) | Attending: Physical Medicine and Rehabilitation | Admitting: Physical Medicine and Rehabilitation

## 2022-11-26 ENCOUNTER — Encounter: Payer: Self-pay | Admitting: Physical Medicine and Rehabilitation

## 2022-11-26 VITALS — BP 155/79 | HR 88 | Ht 66.0 in | Wt 167.0 lb

## 2022-11-26 DIAGNOSIS — K5900 Constipation, unspecified: Secondary | ICD-10-CM | POA: Diagnosis present

## 2022-11-26 DIAGNOSIS — I639 Cerebral infarction, unspecified: Secondary | ICD-10-CM

## 2022-11-26 DIAGNOSIS — I10 Essential (primary) hypertension: Secondary | ICD-10-CM

## 2022-11-26 NOTE — Patient Instructions (Signed)
HTN: -BP is ___today.  -Advised checking BP daily at home and logging results to bring into follow-up appointment with PCP and myself. -Reviewed BP meds today.  -Advised regarding healthy foods that can help lower blood pressure and provided with a list: 1) citrus foods- high in vitamins and minerals 2) salmon and other fatty fish - reduces inflammation and oxylipins 3) swiss chard (leafy green)- high level of nitrates 4) pumpkin seeds- one of the best natural sources of magnesium 5) Beans and lentils- high in fiber, magnesium, and potassium 6) Berries- high in flavonoids 7) Amaranth (whole grain, can be cooked similarly to rice and oats)- high in magnesium and fiber 8) Pistachios- even more effective at reducing BP than other nuts 9) Carrots- high in phenolic compounds that relax blood vessels and reduce inflammation 10) Celery- contain phthalides that relax tissues of arterial walls 11) Tomatoes- can also improve cholesterol and reduce risk of heart disease 12) Broccoli- good source of magnesium, calcium, and potassium 13) Greek yogurt: high in potassium and calcium 14) Herbs and spices: Celery seed, cilantro, saffron, lemongrass, black cumin, ginseng, cinnamon, cardamom, sweet basil, and ginger 15) Chia and flax seeds- also help to lower cholesterol and blood sugar 16) Beets- high levels of nitrates that relax blood vessels  17) spinach and bananas- high in potassium  -Provided lise of supplements that can help with hypertension:  1) magnesium: one high quality brand is Bioptemizers since it contains all 7 types of magnesium, otherwise over the counter magnesium gluconate 400mg is a good option 2) B vitamins 3) vitamin D 4) potassium 5) CoQ10 6) L-arginine 7) Vitamin C 8) Beetroot -Educated that goal BP is 120/80. -Made goal to incorporate some of the above foods into diet.   

## 2022-11-26 NOTE — Progress Notes (Signed)
Subjective:    Patient ID: James Henderson, male    DOB: Jun 28, 1950, 72 y.o.   MRN: [id number]  HPI 1) CVA -discussed that he was not working before this -walking with RW -has not received home therapy yet  2) HTN: -blood pressure is elevated -on amlodipine  3) Pain -had in legs  4) Constipation -sometimes an issue  Pain Inventory Average Pain 2 Pain Right Now 2 My pain is  N/A  LOCATION OF PAIN  N/A  BOWEL Number of stools per week: 3  BLADDER Pads  Frequent urination Yes    Mobility use a walker do you drive?  no  Function not employed: date last employed .  Neuro/Psych bladder control problems trouble walking  Prior Studies Any changes since last visit?  no  Physicians involved in your care Any changes since last visit?  no   Family History  Problem Relation Age of Onset   Stroke Mother    Stroke Brother    Diabetes type II Brother    Social History   Socioeconomic History   Marital status: Married    Spouse name: Mary   Number of children: 1   Years of education: 12   Highest education level: 12th grade  Occupational History   Occupation: maintenance  Tobacco Use   Smoking status: Former    Current packs/day: 0.00    Types: Cigarettes    Quit date: 2000    Years since quitting: 24.7   Smokeless tobacco: Never  Vaping Use   Vaping status: Never Used  Substance and Sexual Activity   Alcohol use: No    Alcohol/week: 0.0 standard drinks of alcohol   Drug use: No   Sexual activity: Not on file  Other Topics Concern   Not on file  Social History Narrative   Patient is married with one biological child and one adopted.   Patient is right handed.   Patient has hs education.   Patient drinks caffeine rarely.   Social Determinants of Health   Financial Resource Strain: Not on file  Food Insecurity: No Food Insecurity (10/30/2022)   Hunger Vital Sign    Worried About Running Out of Food in the Last Year: Never true    Ran Out  of Food in the Last Year: Never true  Transportation Needs: No Transportation Needs (10/30/2022)   PRAPARE - Administrator, Civil Service (Medical): No    Lack of Transportation (Non-Medical): No  Physical Activity: Not on file  Stress: Not on file  Social Connections: Not on file   Past Surgical History:  Procedure Laterality Date   BIOPSY  02/15/2020   Procedure: BIOPSY;  Surgeon: Kerin Salen, MD;  Location: [facility];  Service: Gastroenterology;;   COLONOSCOPY WITH PROPOFOL N/A 02/15/2020   Procedure: COLONOSCOPY WITH PROPOFOL;  Surgeon: Kerin Salen, MD;  Location: [facility];  Service: Gastroenterology;  Laterality: N/A;   EYE SURGERY     POLYPECTOMY  02/15/2020   Procedure: POLYPECTOMY;  Surgeon: Kerin Salen, MD;  Location: [facility];  Service: Gastroenterology;;   Past Medical History:  Diagnosis Date   Brainstem stroke (HCC) 2015   Cerebral meningioma (HCC)    Diabetes mellitus without complication (HCC)    Glaucoma    Hyperlipidemia    Hypertension    Ht 5\' 6"  (1.676 m)   BMI 25.30 kg/m   Opioid Risk Score:   Fall Risk Score:  `1  Depression screen [facility] 2/9     10/28/2017  5:15 PM  Depression screen PHQ 2/9  Decreased Interest 0  Down, Depressed, Hopeless 0  PHQ - 2 Score 0      Review of Systems  All other systems reviewed and are negative.      Objective:   Physical Exam Gen: no distress, normal appearing HEENT: oral mucosa pink and moist, NCAT Cardio: Reg rate Chest: normal effort, normal rate of breathing Abd: soft, non-distended Ext: no edema Psych: pleasant, normal affect Skin: intact Neuro: Alert and oriented x3 MSK: strength intact       Assessment & Plan:  1) CVA -continue therapies -would benefit from handicap placard to increase mobility in the community -reviewed all medications and provided necessary refills. -discussed vagal nerve stimulation if strength fails to improve in 6 months.   2)  HTN: -continue amlodipine -Advised checking BP daily at home and logging results to bring into follow-up appointment with PCP and myself. -Reviewed BP meds today.  -Advised regarding healthy foods that can help lower blood pressure and provided with a list: 1) citrus foods- high in vitamins and minerals 2) salmon and other fatty fish - reduces inflammation and oxylipins 3) swiss chard (leafy green)- high level of nitrates 4) pumpkin seeds- one of the best natural sources of magnesium 5) Beans and lentils- high in fiber, magnesium, and potassium 6) Berries- high in flavonoids 7) Amaranth (whole grain, can be cooked similarly to rice and oats)- high in magnesium and fiber 8) Pistachios- even more effective at reducing BP than other nuts 9) Carrots- high in phenolic compounds that relax blood vessels and reduce inflammation 10) Celery- contain phthalides that relax tissues of arterial walls 11) Tomatoes- can also improve cholesterol and reduce risk of heart disease 12) Broccoli- good source of magnesium, calcium, and potassium 13) Greek yogurt: high in potassium and calcium 14) Herbs and spices: Celery seed, cilantro, saffron, lemongrass, black cumin, ginseng, cinnamon, cardamom, sweet basil, and ginger 15) Chia and flax seeds- also help to lower cholesterol and blood sugar 16) Beets- high levels of nitrates that relax blood vessels  17) spinach and bananas- high in potassium  -Provided lise of supplements that can help with hypertension:  1) magnesium: one high quality brand is Bioptemizers since it contains all 7 types of magnesium, otherwise over the counter magnesium gluconate 400mg  is a good option 2) B vitamins 3) vitamin D 4) potassium 5) CoQ10 6) L-arginine 7) Vitamin C 8) Beetroot -Educated that goal BP is 120/80. -Made goal to incorporate some of the above foods into diet.    3) Constipation:  -Provided list of following foods that help with constipation and highlighted a  few: 1) prunes- contain high amounts of fiber.  2) apples- has a form of dietary fiber called pectin that accelerates stool movement and increases beneficial gut bacteria 3) pears- in addition to fiber, also high in fructose and sorbitol which have laxative effect 4) figs- contain an enzyme ficin which helps to speed colonic transit 5) kiwis- contain an enzyme actinidin that improves gut motility and reduces constipation 6) oranges- rich in pectin (like apples) 7) grapefruits- contain a flavanol naringenin which has a laxative effect 8) vegetables- rich in fiber and also great sources of folate, vitamin C, and K 9) artichoke- high in inulin, prebiotic great for the microbiome 10) chicory- increases stool frequency and softness (can be added to coffee) 11) rhubarb- laxative effect 12) sweet potato- high fiber 13) beans, peas, and lentils- contain both soluble and insoluble fiber 14) chia seeds-  improves intestinal health and gut flora 15) flaxseeds- laxative effect 16) whole grain rye bread- high in fiber 17) oat bran- high in soluble and insoluble fiber 18) kefir- softens stools -recommended to try at least one of these foods every day.  -drink 6-8 glasses of water per day -walk regularly, especially after meals.

## 2023-01-09 ENCOUNTER — Ambulatory Visit: Payer: Medicare (Managed Care) | Admitting: Physician Assistant

## 2023-01-30 NOTE — Progress Notes (Signed)
[facility] Clinic Note - Initial Visit   Date: 01/31/23  Derell Bent MRN: [id number] DOB: 02-07-1951   Small acute infarct in the right medial brain likely due to SVD  James of the head James for acute intracranial Henderson, James Henderson, James Henderson, James Henderson, James severe stenosis of the left PCA P1-P2, MRI of the brain shows small acute infarct within the right medial brain within great matter, without hemorrhage or mass effect, innumerable chronic Henderson concerning for cerebral amyloid angiopathy.  2D echo EF 60 to 65%, mild LVEF, LDL 35, A1c 7.2.He was on Plavix prior to admission, this was changed to Brilinta.   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   History of Present Illness:  Crimson Wegley is a 72 y.o. R-handed male James a history of hypertension, hyperlipidemia, DM2, meningioma and a history of prior brainstem stroke on January 2023 James residual left-sided weakness and aphasia as well as a history of  mixed Alzheimer's and vascular dementia admitted on 10/29/2022 James a 2-day history of increasing left-sided weakness as well as increasing falls.  Workup was remarkable for small acute infarct in the right medial brain James generalized volume loss and unchanged small meningioma.  It was felt that the stroke was due to small vessel disease, he was discharged on aspirin and Brilinta for 4 weeks, followed by aspirin alone  He then was admitted to rehab and he was discharged on stable condition, at his baseline, ambulating James a walker family supervision.   At rehab he had shown chronic moderate mixed dysarthria James slow a forward fluent speech at baseline, therefore no speech therapy was warranted.  He had improvement in activity, tolerance, balance, and postural control.  He was able to complete his basic ADLs to his ability.   Denies vertigo dizziness or vision changes. Denies headaches, or dysphagia. No confusion or seizures. Denies any chest pain, or shortness of breath. Denies any fever or chills, or night sweats. No tobacco. No new meds or hormonal supplements.  .Denies any recent long distance trips or recent surgeries. No sick contacts. No new stressors present in personal life. Patient is compliant James his medications. Memory is stable, today James MMSE 23/30. He may misplace some objects. Family in change of meals, bills, meds etc. He no longer drives. He is on donepezil 10 mg daily and memantine 10 mg bid, tolerating well.         Out-side paper records, electronic medical record, and images have been reviewed where available and summarized as:   Lab Results  Component Value Date   HGBA1C 7.2 (H) 10/30/2022   No results found for: "VITAMINB12" No results found for: "TSH" No results found for: "ESRSEDRATE", "POCTSEDRATE"  Past Medical History:  Diagnosis Date   Brainstem stroke (HCC) 2015   Cerebral meningioma (HCC)    Diabetes mellitus without complication (HCC)    Glaucoma    Hyperlipidemia    Hypertension     Past Surgical History:  Procedure Laterality Date   BIOPSY  02/15/2020   Procedure: BIOPSY;  Surgeon: Kerin Salen, MD;  Location: [facility];  Service: Gastroenterology;;   COLONOSCOPY James PROPOFOL N/A 02/15/2020   Procedure: COLONOSCOPY James PROPOFOL;  Surgeon: Kerin Salen, MD;  Location: [facility];  Service: Gastroenterology;  Laterality: N/A;   EYE SURGERY     POLYPECTOMY  02/15/2020   Procedure: POLYPECTOMY;  Surgeon: Kerin Salen, MD;  Location: [facility]OSCOPY;  Service: Gastroenterology;;      Medications:  Outpatient Encounter Medications as of 01/31/2023  Medication Sig   acetaminophen (TYLENOL) 325 MG tablet Take 1-2 tablets (325-650 mg total) by mouth every 4 (four) hours as needed for mild pain.   amLODipine (NORVASC) 10 MG tablet Take 1 tablet (10 mg total) by mouth daily.   aspirin EC 81 MG tablet Take 1 tablet (81 mg total) by mouth daily. Swallow whole.   brimonidine (ALPHAGAN) 0.2 % ophthalmic solution Place 1 drop into both eyes at bedtime.   brinzolamide (AZOPT) 1 % ophthalmic suspension Place 1 drop into both eyes at bedtime.   Cholecalciferol (VITAMIN D-3) 125 MCG (5000 UT) TABS Take 5,000 Units by mouth daily.   donepezil (ARICEPT) 10 MG tablet Take 1 tablet (10 mg total) by mouth at bedtime.   ezetimibe (ZETIA) 10 MG tablet Take 1 tablet (10 mg total) by mouth daily.   l-methylfolate-B6-B12 (METANX) 3-35-2 MG TABS tablet Take 1 tablet by mouth daily.   magnesium gluconate (MAGONATE) 500 MG tablet Take 1 tablet (500 mg total) by mouth at bedtime.   memantine (NAMENDA) 10 MG tablet Take 1 tablet (10 mg total) by mouth 2 (two) times daily.   metFORMIN (GLUCOPHAGE) 1000 MG tablet Take 0.5 tablets (500 mg total) by mouth 2 (two) times daily James a meal.   rosuvastatin (CRESTOR) 40 MG tablet Take 1 tablet (40 mg total) by mouth daily at 6 PM.   senna-docusate (SENOKOT-S) 8.6-50 MG tablet Take 1 tablet by mouth at bedtime as needed for mild constipation.   ticagrelor (BRILINTA) 90 MG TABS tablet Take 1 tablet (90 mg total) by mouth 2 (two) times daily.   timolol (TIMOPTIC) 0.5 % ophthalmic solution Place 1 drop into both eyes at bedtime.   No facility-administered encounter medications on file as of 01/31/2023.      01/31/2023   12:00 PM  MMSE - Mini Mental State Exam  Orientation to time 3  Orientation to Place 2  Registration 3  Attention/ Calculation 4  Recall 3  Language- name 2 objects 2  Language- repeat 1  Language- follow 3 step command 3   Language- read & follow direction 1  Write a sentence 1  Copy design 0  Total score 23        No data to display            01/26/2020   12:00 PM 03/14/2021    1:00 PM  [employer] Mental Exam  Weekday Correct 1 1  Current year 1 1  What state are we in? 0 1  Amount spent 0 0  Amount left 0 0  # of Animals 0 0  5 objects recall 0 0  Number series 0 1  Hour markers 0 0  Time correct 0 0  Placed X in triangle correctly 1 1  Largest Figure 1 1  Name of male 0 0  Date back to work 0 0  Type of work 0 0  State she lived in 0 0  Total score 4 6     Allergies: No Known Allergies  Family History: Family History  Problem Relation  Age of Onset   Stroke Mother    Stroke Brother    Diabetes type II Brother     Social History: Social History   Tobacco Use   Smoking status: Former    Current packs/day: 0.00    Types: Cigarettes    Quit date: 2000    Years since quitting: 24.9   Smokeless tobacco: Never  Vaping Use   Vaping status: Never Used  Substance Use Topics   Alcohol use: No    Alcohol/week: 0.0 standard drinks of alcohol   Drug use: No   Social History   Social History Narrative   Patient is married James one biological child and one adopted.   Patient is right handed.   Patient has hs education.   Patient drinks caffeine rarely.    Vital Signs:  BP (!) 140/70   Pulse (!) 109   Resp 20   Ht 5\' 6"  (1.676 m)   SpO2 98%   BMI 26.95 kg/m    General Medical Exam:   General:  Well appearing, comfortable.   Eyes/ENT: see cranial nerve examination.   Henderson:   No carotid bruits. Respiratory:  Clear to auscultation, good air entry bilaterally.   Cardiac:  Regular rate and rhythm, 2/6 M  Extremities:  No deformities, edema, or skin discoloration.  Skin:  No rashes or lesions.  Neurological Exam: MENTAL STATUS including orientation to time, place, person, recent and remote memory, attention span and concentration, language, and fund of  knowledge is  appropriate  Speech is James dysarthric.  CRANIAL NERVES: II:  No visual field defects. Fundi not visualized   III-IV-VI: Pupils equal round and reactive to light.  Normal conjugate, extra-ocular eye movements in all directions of gaze.  No nystagmus.  No ptosis .   V:  Normal facial sensation.    VII:  Normal facial symmetry and movements.   VIII:  Normal hearing and vestibular function.   IX-X:  Normal palatal movement.   XI:  Normal shoulder shrug and head rotation.   XII:  Normal tongue strength and range of motion, no deviation or fasciculation.  MOTOR:  No atrophy, fasciculations or abnormal movements.  No pronator drift. LUE and LLE 4/5 strength   SENSORY:  Normal and symmetric perception of light touch, pinprick, vibration, and proprioception.    COORDINATION/GAIT: Normal finger-to- nose-finger and heel-to-shin.  Intact rapid alternating movements bilaterally.  Able to rise from a chair needing the walker for stability, drags L foot.. Unable to test gait.  Stride slow    Total time spent:  75   Thank you for allowing me to participate in patient's care.  If I can answer any additional questions, I would be pleased to do so.    Sincerely,   Marlowe Kays, PA-C

## 2023-01-31 ENCOUNTER — Ambulatory Visit: Payer: Medicare (Managed Care) | Admitting: Physician Assistant

## 2023-01-31 ENCOUNTER — Encounter: Payer: Self-pay | Admitting: Physician Assistant

## 2023-01-31 VITALS — BP 140/70 | HR 109 | Resp 20 | Ht 66.0 in

## 2023-01-31 DIAGNOSIS — F0280 Dementia in other diseases classified elsewhere without behavioral disturbance: Secondary | ICD-10-CM | POA: Diagnosis not present

## 2023-01-31 DIAGNOSIS — I6389 Other cerebral infarction: Secondary | ICD-10-CM | POA: Diagnosis not present

## 2023-01-31 DIAGNOSIS — F0150 Vascular dementia without behavioral disturbance: Secondary | ICD-10-CM | POA: Diagnosis not present

## 2023-01-31 DIAGNOSIS — G309 Alzheimer's disease, unspecified: Secondary | ICD-10-CM

## 2023-01-31 NOTE — Patient Instructions (Signed)
Continue memantine 10mg   twice daily and donepezil 10mg  at bedtime Continue  blood thinned and your cholesterol and BP medications  and diabetes medications   Follow up in  6 months.

## 2023-04-30 ENCOUNTER — Emergency Department (HOSPITAL_COMMUNITY): Payer: Medicare (Managed Care)

## 2023-04-30 ENCOUNTER — Other Ambulatory Visit: Payer: Self-pay

## 2023-04-30 ENCOUNTER — Encounter (HOSPITAL_COMMUNITY): Payer: Self-pay | Admitting: Emergency Medicine

## 2023-04-30 ENCOUNTER — Emergency Department (HOSPITAL_COMMUNITY)
Admission: EM | Admit: 2023-04-30 | Discharge: 2023-04-30 | Disposition: A | Discharge status: Home / Self Care | Payer: Medicare (Managed Care)

## 2023-04-30 DIAGNOSIS — I6782 Cerebral ischemia: Secondary | ICD-10-CM | POA: Insufficient documentation

## 2023-04-30 DIAGNOSIS — I69354 Hemiplegia and hemiparesis following cerebral infarction affecting left non-dominant side: Secondary | ICD-10-CM | POA: Diagnosis not present

## 2023-04-30 DIAGNOSIS — Z7982 Long term (current) use of aspirin: Secondary | ICD-10-CM | POA: Insufficient documentation

## 2023-04-30 DIAGNOSIS — R531 Weakness: Secondary | ICD-10-CM | POA: Diagnosis present

## 2023-04-30 LAB — CBC
HCT: 39.8 % (ref 39.0–52.0)
Hemoglobin: 12.5 g/dL — ABNORMAL LOW (ref 13.0–17.0)
MCH: 29.7 pg (ref 26.0–34.0)
MCHC: 31.4 g/dL (ref 30.0–36.0)
MCV: 94.5 fL (ref 80.0–100.0)
Platelets: 171 10*3/uL (ref 150–400)
RBC: 4.21 MIL/uL — ABNORMAL LOW (ref 4.22–5.81)
RDW: 13.3 % (ref 11.5–15.5)
WBC: 6 10*3/uL (ref 4.0–10.5)
nRBC: 0 % (ref 0.0–0.2)

## 2023-04-30 LAB — RESP PANEL BY RT-PCR (RSV, FLU A&B, COVID)  RVPGX2
Influenza A by PCR: NEGATIVE
Influenza B by PCR: NEGATIVE
Resp Syncytial Virus by PCR: NEGATIVE
SARS Coronavirus 2 by RT PCR: NEGATIVE

## 2023-04-30 LAB — URINALYSIS, ROUTINE W REFLEX MICROSCOPIC
Bilirubin Urine: NEGATIVE
Glucose, UA: NEGATIVE mg/dL
Hgb urine dipstick: NEGATIVE
Ketones, ur: NEGATIVE mg/dL
Leukocytes,Ua: NEGATIVE
Nitrite: NEGATIVE
Protein, ur: NEGATIVE mg/dL
Specific Gravity, Urine: 1.013 (ref 1.005–1.030)
pH: 6 (ref 5.0–8.0)

## 2023-04-30 LAB — BASIC METABOLIC PANEL
Anion gap: 5 (ref 5–15)
BUN: 14 mg/dL (ref 8–23)
CO2: 26 mmol/L (ref 22–32)
Calcium: 9.1 mg/dL (ref 8.9–10.3)
Chloride: 109 mmol/L (ref 98–111)
Creatinine, Ser: 1.36 mg/dL — ABNORMAL HIGH (ref 0.61–1.24)
GFR, Estimated: 55 mL/min — ABNORMAL LOW (ref >60)
Glucose, Bld: 148 mg/dL — ABNORMAL HIGH (ref 70–99)
Potassium: 4.4 mmol/L (ref 3.5–5.1)
Sodium: 140 mmol/L (ref 135–145)

## 2023-04-30 LAB — CBG MONITORING, ED: Glucose-Capillary: 158 mg/dL — ABNORMAL HIGH (ref 70–99)

## 2023-04-30 NOTE — ED Notes (Signed)
Pt's linen changed at this time.

## 2023-04-30 NOTE — ED Notes (Signed)
 Attempted to walk PT w/ NT assist. PT only slightly able to stand, unable to ambulate. MD notified.

## 2023-04-30 NOTE — Evaluation (Signed)
 Physical Therapy Evaluation Patient Details Name: James Henderson MRN: [id number] DOB: Jan 07, 1951 Today's Date: 04/30/2023  History of Present Illness  73 y.o. male presents to [facility] hospital on 04/30/2023 with generalized weakness, unable to ambulate. Workup is negative for acute process. PMH includes CVA with L weakness, HLD, glaucoma, HTN, DM.  Clinical Impression  Pt presents to PT with deficits in strength, power, gait, balance, functional mobility, endurance. Pt with chronic L sided weakness due to prior CVA, pt reports strength is at baseline although no family present to confirm/deny during evaluation. Per pt's spouse the pt requires assistance for transfers at baseline but is typically able to ambulate within the home with support of rollator unassisted. Today the pt demonstrates a persistent posterior lean and requires physical assistance at all times to maintain balance, fatiguing quickly with ambulation attempts. PT contacted the pt's spouse via phone and she would prefer to take the pt home with HHPT. Pt has a wheelchair to utilize in the home if necessary. Pt will benefit from a RW and BSC to reduce risk for falls.      If plan is discharge home, recommend the following: A lot of help with walking and/or transfers;A lot of help with bathing/dressing/bathroom;Assistance with cooking/housework;Direct supervision/assist for medications management;Direct supervision/assist for financial management;Assist for transportation;Help with stairs or ramp for entrance;Supervision due to cognitive status   Can travel by private vehicle        Equipment Recommendations Rolling walker (2 wheels);BSC/3in1  Recommendations for Other Services       Functional Status Assessment Patient has had a recent decline in their functional status and demonstrates the ability to make significant improvements in function in a reasonable and predictable amount of time.     Precautions / Restrictions  Precautions Precautions: Fall Recall of Precautions/Restrictions: Impaired Precaution/Restrictions Comments: pt with chronic L weakness due to prior CVA Restrictions Weight Bearing Restrictions Per Provider Order: No      Mobility  Bed Mobility Overal bed mobility: Needs Assistance Bed Mobility: Supine to Sit, Sit to Supine     Supine to sit: Mod assist, HOB elevated Sit to supine: Mod assist        Transfers Overall transfer level: Needs assistance Equipment used: Rolling walker (2 wheels) Transfers: Sit to/from Stand Sit to Stand: Min assist           General transfer comment: posterior lean    Ambulation/Gait Ambulation/Gait assistance: Mod assist Gait Distance (Feet): 15 Feet Assistive device: Rolling walker (2 wheels) Gait Pattern/deviations: Step-to pattern Gait velocity: reduced Gait velocity interpretation: <1.31 ft/sec, indicative of household ambulator   General Gait Details: pt often with posterior lean requiring modA of PT to correct, reduced stance time on LLE and increased effort to advance  Stairs            Wheelchair Mobility     Tilt Bed    Modified Rankin (Stroke Patients Only)       Balance Overall balance assessment: Needs assistance Sitting-balance support: Feet supported, Bilateral upper extremity supported Sitting balance-Leahy Scale: Poor Sitting balance - Comments: minA Postural control: Posterior lean Standing balance support: Bilateral upper extremity supported Standing balance-Leahy Scale: Poor Standing balance comment: min-modA, posterior lean                             Pertinent Vitals/Pain Pain Assessment Pain Assessment: No/denies pain    Home Living Family/patient expects to be discharged to:: Private residence Living Arrangements:  Spouse/significant other Available Help at Discharge: Family;Available 24 hours/day Type of Home: House Home Access: Ramped entrance       Home Layout: One  level Home Equipment: Rollator (4 wheels);Cane - single point;Shower seat;Wheelchair - manual      Prior Function Prior Level of Function : Needs assist             Mobility Comments: pt requires assistance for bed mobility and transfers, ambulates with rollator unassisted typically but has been requiring help to ambulate in the home over the last week ADLs Comments: is able to self-feed, otherwise requires assistance for all ADLS     Extremity/Trunk Assessment   Upper Extremity Assessment Upper Extremity Assessment: LUE deficits/detail LUE Deficits / Details: grossly 4-/5    Lower Extremity Assessment Lower Extremity Assessment: LLE deficits/detail LLE Deficits / Details: grossly 4-/5    Cervical / Trunk Assessment Cervical / Trunk Assessment: Normal  Communication   Communication Communication: Impaired Factors Affecting Communication: Difficulty expressing self;Reduced clarity of speech    Cognition Arousal: Alert Behavior During Therapy: Flat affect   PT - Cognitive impairments: History of cognitive impairments                       PT - Cognition Comments: pt with prior CVA, is alert and oriented to person, place, situation. Pt follows commands with increased time. Pt appears to have some reduced insight into his deficits Following commands: Impaired Following commands impaired: Follows one step commands with increased time     Cueing Cueing Techniques: Verbal cues, Tactile cues     General Comments General comments (skin integrity, edema, etc.): VSS on RA, pt incontinent of urine prior to PT arrival, nursing staff assist in linen change    Exercises     Assessment/Plan    PT Assessment Patient needs continued PT services  PT Problem List Decreased strength;Decreased activity tolerance;Decreased balance;Decreased mobility;Decreased knowledge of use of DME;Decreased safety awareness       PT Treatment Interventions DME instruction;Gait  training;Functional mobility training;Therapeutic activities;Therapeutic exercise;Balance training;Neuromuscular re-education;Patient/family education;Wheelchair mobility training    PT Goals (Current goals can be found in the Care Plan section)  Acute Rehab PT Goals Patient Stated Goal: to improve strength, reduce falls risk PT Goal Formulation: With patient/family Time For Goal Achievement: 05/14/23 Potential to Achieve Goals: Fair    Frequency Min 2X/week     Co-evaluation               AM-PAC PT "6 Clicks" Mobility  Outcome Measure Help needed turning from your back to your side while in a flat bed without using bedrails?: A Lot Help needed moving from lying on your back to sitting on the side of a flat bed without using bedrails?: A Lot Help needed moving to and from a bed to a chair (including a wheelchair)?: A Lot Help needed standing up from a chair using your arms (e.g., wheelchair or bedside chair)?: A Lot Help needed to walk in hospital room?: Total Help needed climbing 3-5 steps with a railing? : Total 6 Click Score: 10    End of Session Equipment Utilized During Treatment: Gait belt Activity Tolerance: Patient limited by fatigue Patient left: in bed;with call bell/phone within reach Nurse Communication: Mobility status PT Visit Diagnosis: Other abnormalities of gait and mobility (R26.89);Muscle weakness (generalized) (M62.81);History of falling (Z91.81)    Time: 7829-5621 PT Time Calculation (min) (ACUTE ONLY): 30 min   Charges:   PT Evaluation $PT Eval  Low Complexity: 1 Low   PT General Charges $$ ACUTE PT VISIT: 1 Visit         Arlyss Gandy, PT, DPT Acute Rehabilitation Office [phone]   Arlyss Gandy 04/30/2023, 10:06 AM

## 2023-04-30 NOTE — Discharge Planning (Signed)
 RNCM notes PT recommendation for HHPT, rolling walker and bedside commode.  RNCM placed call to James Henderson, SW with [facility] to coordinate home health and equipment.  RNCM got permission from pt to contact wife regarding delivery of DME.  Wife, James Henderson prefers DME be delivered to pt at bedside and taken home by son, James Henderson (at bedside).

## 2023-04-30 NOTE — Discharge Instructions (Signed)
 You were seen for your weakness in the emergency department.  Please follow-up with your primary doctor in 2 to 3 days.  Work with home health PT as well.  Return to the emergency department if you have any concerns.

## 2023-04-30 NOTE — ED Provider Triage Note (Signed)
 Emergency Medicine Provider Triage Evaluation Note  James Henderson , a 73 y.o. male  was evaluated in triage.  Pt complains of Generalized weakness.  Patient was reportedly walking to the bathroom and felt that he cannot walk anymore and family lowered him to the floor.  He does history of previous stroke.  Patient does not report falling..  Review of Systems  Positive:  Negative:   Physical Exam  BP 135/73 (BP Location: Left Arm)   Pulse 81   Temp 99.4 F (37.4 C) (Oral)   Resp 15   SpO2 96%  Gen:   Awake, no distress   Resp:  Normal effort  MSK:   Moves extremities without difficulty  Other:    Medical Decision Making  Medically screening exam initiated at 12:29 AM.  Appropriate orders placed.  James Henderson was informed that the remainder of the evaluation will be completed by another provider, this initial triage assessment does not replace that evaluation, and the importance of remaining in the ED until their evaluation is complete.     Darrick Grinder, PA-C 04/30/23 0030

## 2023-04-30 NOTE — ED Triage Notes (Signed)
 Pt BIB EMS from home for generalized weakness. Pt states he was walking to the bathroom and felt that he could not walk anymore so family lowered him to the floor. LKW 2200. Hx of stroke.

## 2023-04-30 NOTE — Progress Notes (Addendum)
 9:55am: CSW received secure chat from PT stating that he spoke with patient's wife who wants patient to come home with home health and DME instead of going to a facility.  RN CM to order [facility] services and DME.  9:30am: CSW received consult for patient for possible SNF.  Patient waiting to be seen by PT for recommendations.  Edwin Dada, MSW, LCSW Transitions of Care  Clinical Social Worker II [phone]

## 2023-04-30 NOTE — Discharge Planning (Signed)
 RNCM consulted regarding safe discharge planning (Home with Home Health vs Skilled Nursing Facility Placement).  Physical Therapy evaluation placed; will follow up after recommendations from PT.   RNCM placed call to [facility] to coordinate with case managers; office closed; will return call after 8:00.  James Henderson J. James Roers, RN, BSN, NCM  Transitions of Care  Nurse Case Manager  [facility] Emergency Departments  Operative Services  [phone]

## 2023-04-30 NOTE — ED Provider Notes (Signed)
 [place] EMERGENCY DEPARTMENT AT [facility] Provider Note   CSN: [id number] Arrival date & time: 04/30/23  0000     History  Chief Complaint  Patient presents with   Weakness    James Henderson is a 73 y.o. male.  73 year old male with past medical history of CVA with left-sided deficits at baseline presenting to the emergency department today with what sounds like generalized weakness.  The patient states that he does feel little weaker on his left side which is where he has the chronic weakness from his previous stroke.  The patient states that he woke up this morning and felt weaker.  He is has been able to ambulate at home at his baseline until this evening.  He apparently felt weak in his legs and his family had to help him to the ground.  He did not hit his head or injure himself.  He did not lose consciousness.  Denies any associated chest pain.  He denies any fevers, chills, or cough.  Denies any urinary symptoms.  He was brought in for further evaluation regarding this.   Weakness      Home Medications Prior to Admission medications   Medication Sig Start Date End Date Taking? Authorizing Provider  acetaminophen (TYLENOL) 325 MG tablet Take 1-2 tablets (325-650 mg total) by mouth every 4 (four) hours as needed for mild pain. 11/13/22   Love, Evlyn Kanner, PA-C  amLODipine (NORVASC) 10 MG tablet Take 1 tablet (10 mg total) by mouth daily. 11/13/22   Love, Evlyn Kanner, PA-C  aspirin EC 81 MG tablet Take 1 tablet (81 mg total) by mouth daily. Swallow whole. 11/13/22   Love, Evlyn Kanner, PA-C  brimonidine (ALPHAGAN) 0.2 % ophthalmic solution Place 1 drop into both eyes at bedtime. 11/13/22   Love, Evlyn Kanner, PA-C  brinzolamide (AZOPT) 1 % ophthalmic suspension Place 1 drop into both eyes at bedtime. 11/13/22   Love, Evlyn Kanner, PA-C  Cholecalciferol (VITAMIN D-3) 125 MCG (5000 UT) TABS Take 5,000 Units by mouth daily.    [provider]  donepezil (ARICEPT) 10 MG tablet Take  1 tablet (10 mg total) by mouth at bedtime. 11/13/22   Love, Evlyn Kanner, PA-C  ezetimibe (ZETIA) 10 MG tablet Take 1 tablet (10 mg total) by mouth daily. 11/13/22   Love, Evlyn Kanner, PA-C  l-methylfolate-B6-B12 (METANX) 3-35-2 MG TABS tablet Take 1 tablet by mouth daily. 11/13/22   Love, Evlyn Kanner, PA-C  magnesium gluconate (MAGONATE) 500 MG tablet Take 1 tablet (500 mg total) by mouth at bedtime. 11/13/22   Love, Evlyn Kanner, PA-C  memantine (NAMENDA) 10 MG tablet Take 1 tablet (10 mg total) by mouth 2 (two) times daily. 11/13/22   Love, Evlyn Kanner, PA-C  metFORMIN (GLUCOPHAGE) 1000 MG tablet Take 0.5 tablets (500 mg total) by mouth 2 (two) times daily with a meal. 11/13/22   Love, Evlyn Kanner, PA-C  rosuvastatin (CRESTOR) 40 MG tablet Take 1 tablet (40 mg total) by mouth daily at 6 PM. 11/13/22   Love, Evlyn Kanner, PA-C  senna-docusate (SENOKOT-S) 8.6-50 MG tablet Take 1 tablet by mouth at bedtime as needed for mild constipation. 11/12/22   Love, Evlyn Kanner, PA-C  ticagrelor (BRILINTA) 90 MG TABS tablet Take 1 tablet (90 mg total) by mouth 2 (two) times daily. 11/13/22   Love, Evlyn Kanner, PA-C  timolol (TIMOPTIC) 0.5 % ophthalmic solution Place 1 drop into both eyes at bedtime.    [provider]  Allergies    Patient has no known allergies.    Review of Systems   Review of Systems  Neurological:  Positive for weakness.  All other systems reviewed and are negative.   Physical Exam Updated Vital Signs BP 129/75   Pulse 81   Temp 98.9 F (37.2 C) (Oral)   Resp 18   SpO2 99%  Physical Exam Vitals and nursing note reviewed.   Gen: NAD, chronically ill-appearing Eyes: PERRL, EOMI HEENT: no oropharyngeal swelling Neck: trachea midline Resp: clear to auscultation bilaterally Card: RRR, no murmurs, rubs, or gallops Abd: nontender, nondistended Extremities: no calf tenderness, no edema Vascular: 2+ radial pulses bilaterally, 2+ DP pulses bilaterally Neuro: Cranial nerves intact, the patient does  have dysarthria without significant aphasia, seems to have relatively equal strength in the left and right upper and lower extremities although he may have some mild weakness on the left.  Patient states that he does feel weaker here compared to his baseline.  Reports normal sensation throughout. Skin: no rashes Psyc: acting appropriately   ED Results / Procedures / Treatments   Labs (all labs ordered are listed, but only abnormal results are displayed) Labs Reviewed  BASIC METABOLIC PANEL - Abnormal; Notable for the following components:      Result Value   Glucose, Bld 148 (*)    Creatinine, Ser 1.36 (*)    GFR, Estimated 55 (*)    All other components within normal limits  CBC - Abnormal; Notable for the following components:   RBC 4.21 (*)    Hemoglobin 12.5 (*)    All other components within normal limits  CBG MONITORING, ED - Abnormal; Notable for the following components:   Glucose-Capillary 158 (*)    All other components within normal limits  RESP PANEL BY RT-PCR (RSV, FLU A&B, COVID)  RVPGX2  URINALYSIS, ROUTINE W REFLEX MICROSCOPIC    EKG EKG Interpretation Date/Time:  Tuesday April 30 2023 00:27:32 EDT Ventricular Rate:  80 PR Interval:  146 QRS Duration:  70 QT Interval:  314 QTC Calculation: 362 R Axis:   51  Text Interpretation: Normal sinus rhythm Nonspecific T wave abnormality Abnormal ECG When compared with ECG of 29-Oct-2022 16:54, PREVIOUS ECG IS PRESENT Confirmed by Beckey Downing [phone]) on 04/30/2023 1:02:32 AM  Radiology DG Chest Portable 1 View Result Date: 04/30/2023 CLINICAL DATA:  Weakness EXAM: PORTABLE CHEST 1 VIEW COMPARISON:  10/29/2022 FINDINGS: The heart size and mediastinal contours are within normal limits. Both lungs are clear. The visualized skeletal structures are unremarkable. IMPRESSION: No active disease. Electronically Signed   By: Deatra Robinson M.D.   On: 04/30/2023 02:57   MR BRAIN WO CONTRAST Result Date: 04/30/2023 CLINICAL DATA:   Acute neurologic deficit EXAM: MRI HEAD WITHOUT CONTRAST TECHNIQUE: Multiplanar, multiecho pulse sequences of the brain and surrounding structures were obtained without intravenous contrast. COMPARISON:  10/29/2022 FINDINGS: Brain: No acute infarct, mass effect or extra-axial collection. Numerous chronic microhemorrhages in a predominantly peripheral distribution. There is confluent hyperintense T2-weighted signal within the white matter. Generalized volume loss. The midline structures are normal. Vascular: Normal flow voids. Skull and upper cervical spine: Normal calvarium and skull base. Visualized upper cervical spine and soft tissues are normal. Sinuses/Orbits:No paranasal sinus fluid levels or advanced mucosal thickening. No mastoid or middle ear effusion. Normal orbits. IMPRESSION: 1. No acute intracranial abnormality. 2. Numerous chronic microhemorrhages in a predominantly peripheral distribution, likely cerebral amyloid angiopathy. 3. Findings of chronic small vessel ischemia and volume loss. Electronically Signed  By: Deatra Robinson M.D.   On: 04/30/2023 02:14    Procedures Procedures    Medications Ordered in ED Medications - No data to display  ED Course/ Medical Decision Making/ A&P                                 Medical Decision Making 73 year old male with past medical history of CVA in the past with left-sided deficits presenting to the emergency department today with concern for weakness.  His neuroexam is relatively reassuring.  He states that his symptoms began upon awakening today.  He has been ambulatory all day and does not have obvious findings of large vessel occlusion that is acute here today.  I will obtain an MRI to evaluate for recurrent stroke but I suspect that this is likely an exacerbation of his previous stroke symptoms.  Will obtain labs to evaluate for electrolyte abnormalities or anemia.  Also obtain a urinalysis and chest x-ray as well as an RSV/COVID/flu test to  evaluate for infectious etiologies that may be leading to some generalized weakness that is manifesting more on the left.  He is clearly outside the window for any acute neurointervention at this time so we will hold off on calling a stroke alert.  I will reevaluate for ultimate disposition.  The patient's labs and infectious workup is unremarkable.  MRI shows some chronic findings.  This was discussed with neurology and no acute workup or evaluation is needed for this.  The patient was able to ambulate at his baseline.  He will be kept in ED observation for social work consult to see if he be appropriate for rehabilitation in an inpatient setting versus home health.  Amount and/or Complexity of Data Reviewed Radiology: ordered.           Final Clinical Impression(s) / ED Diagnoses Final diagnoses:  Generalized weakness    Rx / DC Orders ED Discharge Orders     None         Durwin Glaze, MD 04/30/23 [phone]

## 2023-04-30 NOTE — ED Notes (Signed)
 Updated pts wife, [name], by phone.

## 2023-04-30 NOTE — Progress Notes (Signed)
 Transition of Care [facility]) - Emergency Department Mini Assessment   Patient Details  Name: James Henderson MRN: [id number] Date of Birth: [birth year]  Transition of Care [facility]) CM/SW Contact:    James Cohn, RN Phone Number: 04/30/2023, 10:43 AM   Clinical Narrative: RNCM spoke with pt wife, James Henderson and James Henderson, SW to develop discharge plan.  Per James Henderson, she is in agreement that pt be discharged home and any additional needs be coordianated through [facility].   James Henderson, SW with [facility] states pt has wheelchair, rollator, and bedside commode already in the home.  She asked that we place the order for home health so that he may participate when he arrives for the adult day care program daily. RNCM spoke with pt and son, James Henderson at bedside to communicate the discharge plan.  James Henderson will transport pt home at discharge.  ED Mini Assessment: What brought you to the Emergency Department? : (P) generalized weakness  Barriers to Discharge: (P) Other (must enter comment) (PT consult, [facility] return call)     Means of departure: (P) Car  Interventions which prevented an admission or readmission: Home Health Consult or Services    Patient Contact and Communications     Spoke with: Demetrius Charity) James Henderson, spouse and James Henderson, SW Contact Date: (P) 04/30/23,   Contact time: (P) 1022 Contact Phone Number: (P) [phone] [facility]); [phone] James Henderson, SW) Call outcome: (P) information obtained for discharge plan         Admission diagnosis:  Generalized Weakness Patient Active Problem List   Diagnosis Date Noted   Posterior circulation stroke (HCC) 10/30/2022   Acute ischemic stroke (HCC) 10/29/2022   Mixed Alzheimer's and vascular dementia (HCC) 10/29/2022   Unsteady gait 03/10/2021   Left-sided weakness 09/25/2020   AKI (acute kidney injury) (HCC) 09/24/2020   Diabetes mellitus type 2 in nonobese (HCC)    Benign essential HTN    History of CVA (cerebrovascular accident)    Acute  blood loss anemia    CKD stage 3a, GFR 45-59 ml/min (HCC)    Acute CVA (cerebrovascular accident) (HCC) 10/21/2017   HLD (hyperlipidemia) 01/10/2014   Acute brainstem infarction (HCC) 12/07/2013   Dizziness 12/06/2013   DM (diabetes mellitus) type II controlled, neurological manifestation (HCC) 12/06/2013   HYPERLIPIDEMIA 02/26/2006   Unspecified glaucoma 02/26/2006   Essential hypertension 02/26/2006   PCP:  [facility], [facility]:   [facility] [id number] - [address] [place] - [address] [place] [address] Phone: [phone] Fax: [phone]  [facility] [address] [place] [address] Phone: [phone] Fax: [phone]

## 2023-04-30 NOTE — ED Provider Notes (Signed)
 Emergency Medicine Observation Re-evaluation Note  James Henderson is a 73 y.o. male, seen on rounds today.  Pt initially presented to the ED for complaints of Weakness Currently, the patient is not having any acute complaints.  Physical Exam  BP 137/76   Pulse 75   Temp 98.9 F (37.2 C) (Oral)   Resp 15   SpO2 97%  Physical Exam General: Resting comfortably in stretcher.  Conversant Lungs: Normal work of breathing Neuro: Full strength in bilateral upper and lower extremities.  No cranial nerve deficits  ED Course / MDM  EKG:EKG Interpretation Date/Time:  Tuesday April 30 2023 00:27:32 EDT Ventricular Rate:  80 PR Interval:  146 QRS Duration:  70 QT Interval:  314 QTC Calculation: 362 R Axis:   51  Text Interpretation: Normal sinus rhythm Nonspecific T wave abnormality Abnormal ECG When compared with ECG of 29-Oct-2022 16:54, PREVIOUS ECG IS PRESENT Confirmed by Beckey Downing [phone]) on 04/30/2023 1:02:32 AM  I have reviewed the labs performed to date as well as medications administered while in observation.  Recent changes in the last 24 hours include seen by physical therapy who was initially recommending inpatient rehab.  Family (son at the bedside) instead would like to take the patient home with home health PT.  Plan  Current plan is for discharge home with home health PT.    Rondel Baton, MD 04/30/23 [phone]

## 2023-05-06 LAB — CBG MONITORING, ED: Glucose-Capillary: 131 mg/dL — ABNORMAL HIGH (ref 70–99)

## 2023-08-01 ENCOUNTER — Ambulatory Visit: Payer: Medicare (Managed Care) | Admitting: Physician Assistant

## 2023-08-01 NOTE — Progress Notes (Incomplete)
 NEUROLOGY FOLLOW UP OFFICE NOTE  James Henderson [id number]  Assessment/Plan:   Small acute infarct in the right medial brain likely due to SVD   CT of the head negative for acute intracranial abnormalities, with generalized atrophy and chronic microhemorrhages, CT angio of the head and neck, negative for LVO, chronically severe stenosis of the left PCA P1-P2, MRI of the brain shows small acute infarct within the right medial brain within great matter, without hemorrhage or mass effect, innumerable chronic microhemorrhages concerning for cerebral amyloid angiopathy.  2D echo EF 60 to 65%, mild LVEF, LDL 35, A1c 7.2.He was on Plavix  prior to admission, this was changed to Brilinta  90 mg twice daily for 4 weeks..  Continue baby aspirin  daily Continue to monitor secondary stroke prevention, cardiovascular risk factors as per cardiology  Dementia due to mixed vascular and Alzheimer disease Stable, last MMSE was 23/30 on December 2024 Continue memantine  10 mg twice daily and donepezil  10 mg daily.  Subjective:   James Henderson is a 73 y.o. R-handed male with a history of hypertension, hyperlipidemia, DM2, meningioma and a history of prior brainstem stroke on January 2023 with residual left-sided weakness and aphasia as well as a history of  mixed Alzheimer's and vascular dementia seen today in follow-up after his acute CVA on December 2024. He denies any further episodes of stroke.  He denies any vertigo or dizziness, or vision changes.  Denies any headaches or dysphagia.  No confusion or seizures.  Denies any chest pain or shortness of breath.  Denies any fever or chills, no night sweats.  No tobacco.  No new medications or hormone supplements.  Denies any recent recurrences or recent surgeries.  No sick contacts.  No new stressors.  Personal life.  He is compliant with his medications.  Memory is stable, with last MMSE on the 2024 23/30.  Family is in charge of convenience, bills, medications  etc.  He no longer drives.  Mood is good.  PAST MEDICAL HISTORY: Past Medical History:  Diagnosis Date   Brainstem stroke (HCC) 2015   Cerebral meningioma (HCC)    Diabetes mellitus without complication (HCC)    Glaucoma    Hyperlipidemia    Hypertension     MEDICATIONS: Current Outpatient Medications on File Prior to Visit  Medication Sig Dispense Refill   acetaminophen  (TYLENOL ) 325 MG tablet Take 1-2 tablets (325-650 mg total) by mouth every 4 (four) hours as needed for mild pain. 100 tablet 0   amLODipine  (NORVASC ) 10 MG tablet Take 1 tablet (10 mg total) by mouth daily.     aspirin  EC 81 MG tablet Take 1 tablet (81 mg total) by mouth daily. Swallow whole. 30 tablet 12   brimonidine  (ALPHAGAN ) 0.2 % ophthalmic solution Place 1 drop into both eyes at bedtime.     brinzolamide  (AZOPT ) 1 % ophthalmic suspension Place 1 drop into both eyes at bedtime.     Cholecalciferol  (VITAMIN D -3) 125 MCG (5000 UT) TABS Take 5,000 Units by mouth daily.     donepezil  (ARICEPT ) 10 MG tablet Take 1 tablet (10 mg total) by mouth at bedtime.     ezetimibe  (ZETIA ) 10 MG tablet Take 1 tablet (10 mg total) by mouth daily.     l-methylfolate-B6-B12 (METANX) 3-35-2 MG TABS tablet Take 1 tablet by mouth daily. 60 tablet 0   magnesium  gluconate (MAGONATE) 500 MG tablet Take 1 tablet (500 mg total) by mouth at bedtime. 30 tablet 0   memantine  (NAMENDA ) 10 MG tablet Take  1 tablet (10 mg total) by mouth 2 (two) times daily.     metFORMIN  (GLUCOPHAGE ) 1000 MG tablet Take 0.5 tablets (500 mg total) by mouth 2 (two) times daily with a meal.     rosuvastatin  (CRESTOR ) 40 MG tablet Take 1 tablet (40 mg total) by mouth daily at 6 PM.     senna-docusate (SENOKOT-S) 8.6-50 MG tablet Take 1 tablet by mouth at bedtime as needed for mild constipation.     ticagrelor  (BRILINTA ) 90 MG TABS tablet Take 1 tablet (90 mg total) by mouth 2 (two) times daily. 30 tablet 0   timolol  (TIMOPTIC ) 0.5 % ophthalmic solution Place 1 drop  into both eyes at bedtime.     No current facility-administered medications on file prior to visit.    ALLERGIES: No Known Allergies  FAMILY HISTORY: Family History  Problem Relation Age of Onset   Stroke Mother    Stroke Brother    Diabetes type II Brother    ***.   Objective:  *** General: No acute distress.  Patient appears *** groomed.   Head:  Normocephalic/atraumatic Eyes:  Fundi examined but not visualized Neck: supple, no paraspinal tenderness, full range of motion Heart:  Regular rate and rhythm 2 out of 6 systolic murmur Lungs:  Clear to auscultation bilaterally Back: No paraspinal tenderness Neurological Exam: alert and oriented to person, place, and time. Attention span and concentration intact, recent and remote memory intact, fund of knowledge intact.  Speech fluent and not dysarthric, language intact.  CN II-XII intact. Bulk and tone normal, muscle strength 4/5 throughout.  Sensation to light touch, temperature and vibration intact.  Deep tendon reflexes 2+ throughout, toes downgoing.  Finger to nose and heel to shin testing intact.  Continues to drag the left foot.***.  Unable to test gait.  Stride is slow and short     Janne Members, DO  CC: ***

## 2023-08-07 ENCOUNTER — Ambulatory Visit: Payer: Medicare (Managed Care) | Admitting: Physician Assistant

## 2023-08-07 ENCOUNTER — Encounter: Payer: Self-pay | Admitting: Physician Assistant

## 2023-08-07 ENCOUNTER — Ambulatory Visit (INDEPENDENT_AMBULATORY_CARE_PROVIDER_SITE_OTHER): Payer: Medicare (Managed Care) | Admitting: Physician Assistant

## 2023-08-07 VITALS — BP 134/76 | HR 67 | Resp 18 | Ht 66.0 in

## 2023-08-07 DIAGNOSIS — F0280 Dementia in other diseases classified elsewhere without behavioral disturbance: Secondary | ICD-10-CM | POA: Diagnosis not present

## 2023-08-07 DIAGNOSIS — F0150 Vascular dementia without behavioral disturbance: Secondary | ICD-10-CM

## 2023-08-07 DIAGNOSIS — I6389 Other cerebral infarction: Secondary | ICD-10-CM

## 2023-08-07 DIAGNOSIS — G309 Alzheimer's disease, unspecified: Secondary | ICD-10-CM

## 2023-08-07 NOTE — Patient Instructions (Signed)
 Continue memantine  10mg   twice daily and donepezil  10mg  at bedtime Continue  blood thinner and your cholesterol and BP medications  and diabetes medications   Follow up in  6 months.

## 2023-08-07 NOTE — Progress Notes (Signed)
 Assessment/Plan:   Dementia likely due to mixed vascular and Alzheimer's disease  James Henderson is a very pleasant 73 y.o. RH male with a history of hypertension, hyperlipidemia, DM2, meningioma and a history of prior brainstem stroke on January 2023 with residual left-sided weakness and aphasia, small acute infarct in the right medial brain likely due to SVD (see below) as well as a history of  mixed Alzheimer's and vascular dementia seen today in follow up for memory loss. Patient is currently on memantine  10 mg twice daily and donepezil  10 mg daily, tolerating well. Memory is stable. Mood is good.   Follow up in 6  months. Continue donepezil  10 mg daily and memantine  10 mg twice daily.  Side effects discussed  Recommend good control of her cardiovascular risk factors Continue to control mood as per PCP    Prior Small acute infarct in the right medial brain likely due to SVD (2024)   CT of the head negative for acute intracranial abnormalities, with generalized atrophy and chronic microhemorrhages, CT angio of the head and neck, negative for LVO, chronically severe stenosis of the left PCA P1-P2, MRI of the brain shows small acute infarct within the right medial brain within great matter, without hemorrhage or mass effect, innumerable chronic microhemorrhages concerning for cerebral amyloid angiopathy.  2D echo EF 60 to 65%, mild LVEF, LDL 35, A1c 7.2.He was on Plavix  prior to admission, this was changed to Brilinta  90 mg twice daily for 4 weeks..  Continue baby aspirin  daily Continue to monitor secondary stroke prevention, cardiovascular risk factors as per cardiology   Subjective:    This patient is accompanied in the office by his son who supplements the history.  Previous records as well as any outside records available were reviewed prior to todays visit. Patient was last seen on 01/31/2023 with MMSE 23/30.    Any changes in memory since last visit? About the same. He is  still good at dates, birthdays. LTM is good.  repeats oneself?  Endorsed Disoriented when walking into a room? Denies    Leaving objects?  May misplace things but not in unusual places   Wandering behavior?  denies   Any personality changes since last visit?  Denies.   Any worsening depression?:  Denies.   Hallucinations or paranoia?  Denies.   Seizures? denies    Any sleep changes? Sleeps well and naps during the day.  Denies vivid dreams, REM behavior or sleepwalking   Sleep apnea?   Denies.   Any hygiene concerns? Denies.  Independent of bathing and dressing?  Endorsed  Does the patient needs help with medications?  Son is in charge   Who is in charge of the finances? Son and patient's wife  in charge     Any changes in appetite?  Trying to eat healthier, does not eat as much.    Patient have trouble swallowing? Denies.   Does the patient cook? No Any headaches?   denies   Any vision changes? Denies.  Chronic back pain  denies   Ambulates with difficulty? Denies.    Recent falls or head injuries? Denies.     Any new strokelike symptoms? denies   Any tremors?  Only when he tries to focus on something    Any anosmia?  Denies   Any incontinence of urine?  Endorsed, wears diapers at night  Any bowel dysfunction?   Denies      Patient lives  wife and son Does  the patient drive? No longer drives    MRI of the brain 04/30/2023, personally reviewed without acute intracranial abnormalities, numerous chronic microhemorrhages mostly in the peripheral distribution, likely cerebral amyloid angiopathy, as well as chronic small vessel ischemia and volume loss.  CT angio of the head and neck, negative for LVO, chronically severe stenosis of the left PCA P1-P2,   MRI of the brain shows small acute infarct within the right medial brain within great matter, without hemorrhage or mass effect, innumerable chronic microhemorrhages concerning for cerebral amyloid angiopathy.   PREVIOUS MEDICATIONS:    CURRENT MEDICATIONS:  Outpatient Encounter Medications as of 08/07/2023  Medication Sig   acetaminophen  (TYLENOL ) 325 MG tablet Take 1-2 tablets (325-650 mg total) by mouth every 4 (four) hours as needed for mild pain.   amLODipine  (NORVASC ) 10 MG tablet Take 1 tablet (10 mg total) by mouth daily.   aspirin  EC 81 MG tablet Take 1 tablet (81 mg total) by mouth daily. Swallow whole.   brimonidine  (ALPHAGAN ) 0.2 % ophthalmic solution Place 1 drop into both eyes at bedtime.   brinzolamide  (AZOPT ) 1 % ophthalmic suspension Place 1 drop into both eyes at bedtime.   Cholecalciferol  (VITAMIN D -3) 125 MCG (5000 UT) TABS Take 5,000 Units by mouth daily.   donepezil  (ARICEPT ) 10 MG tablet Take 1 tablet (10 mg total) by mouth at bedtime.   ezetimibe  (ZETIA ) 10 MG tablet Take 1 tablet (10 mg total) by mouth daily.   l-methylfolate-B6-B12 (METANX) 3-35-2 MG TABS tablet Take 1 tablet by mouth daily.   magnesium  gluconate (MAGONATE) 500 MG tablet Take 1 tablet (500 mg total) by mouth at bedtime.   memantine  (NAMENDA ) 10 MG tablet Take 1 tablet (10 mg total) by mouth 2 (two) times daily.   metFORMIN  (GLUCOPHAGE ) 1000 MG tablet Take 0.5 tablets (500 mg total) by mouth 2 (two) times daily with a meal.   rosuvastatin  (CRESTOR ) 40 MG tablet Take 1 tablet (40 mg total) by mouth daily at 6 PM.   senna-docusate (SENOKOT-S) 8.6-50 MG tablet Take 1 tablet by mouth at bedtime as needed for mild constipation.   ticagrelor  (BRILINTA ) 90 MG TABS tablet Take 1 tablet (90 mg total) by mouth 2 (two) times daily.   timolol  (TIMOPTIC ) 0.5 % ophthalmic solution Place 1 drop into both eyes at bedtime.   No facility-administered encounter medications on file as of 08/07/2023.       01/31/2023   12:00 PM  MMSE - Mini Mental State Exam  Orientation to time 3  Orientation to Place 2  Registration 3  Attention/ Calculation 4  Recall 3  Language- name 2 objects 2  Language- repeat 1  Language- follow 3 step command 3   Language- read & follow direction 1  Write a sentence 1  Copy design 0  Total score 23       No data to display          Objective:     PHYSICAL EXAMINATION:    VITALS:   Vitals:   08/07/23 1258  BP: 134/76  Pulse: 67  Resp: 18  SpO2: 98%  Height: 5' 6 (1.676 m)    GEN:  The patient appears stated age and is in NAD. HEENT:  Normocephalic, atraumatic.   Neurological examination:  General: NAD, well-groomed, appears stated age. Orientation: The patient is alert. Oriented to person, place and date Cranial nerves: There is good facial symmetry.The speech is not  fluent and clear.  No aphasia.  He has chronic  dysarthria. Fund of knowledge is appropriate. Recent and remote memory are impaired. Attention and concentration are reduced. Able to name objects and repeat phrases.  Hearing is intact to conversational tone.   Sensation: Sensation is intact to light touch throughout Motor: Strength is at least antigravity x4 on the R, on the L 4/5 DTR's 1/4 in UE/LE     Movement examination: Tone: There is normal tone in the UE/LE Abnormal movements:  no tremor.  No myoclonus.  No asterixis.   Coordination:  There is no decremation with RAM's. Normal finger to nose  Gait and Station: The patient has difficulty arising out of a deep-seated chair without the use of the hands.  Drags the left foot.  Stride is slow and short.  Unable to test gait.  Thank you for allowing us  the opportunity to participate in the care of this nice patient. Please do not hesitate to contact us  for any questions or concerns.   Total time spent on today's visit was 30 minutes dedicated to this patient today, preparing to see patient, examining the patient, ordering tests and/or medications and counseling the patient, documenting clinical information in the EHR or other health record, independently interpreting results and communicating results to the patient/family, discussing treatment and goals,  answering patient's questions and coordinating care.  Cc:  Sarrah Cure, DO  [facility] 08/07/2023 2:47 PM

## 2023-11-22 ENCOUNTER — Other Ambulatory Visit: Payer: Self-pay

## 2023-11-22 DIAGNOSIS — R7989 Other specified abnormal findings of blood chemistry: Secondary | ICD-10-CM

## 2024-01-13 ENCOUNTER — Other Ambulatory Visit

## 2024-02-05 NOTE — Progress Notes (Signed)
 James Henderson is a very pleasant 73 y.o. RH male with a history of hypertension, hyperlipidemia, DM2, meningioma and a history of prior brainstem stroke on January 2023 with residual left-sided weakness and aphasia/dysarthria, and recurrent stroke 10/29/2022 with same symptoms as well as a history of  mixed Alzheimer's and vascular dementia seen today in follow up for memory loss. Patient is currently on donepezil  10 mg daily and memantine  10 mg twice daily. This patient is accompanied in the office by his son who supplements the history.  Previous records as well as any outside records available were reviewed prior to todays visit. Patient was last seen on 08/04/2023. Memory is stable Patient is able to participate on ADLsto his ability, he is at [facility] program during the day from 8-4 pm. Mood is good.    Mixed Alzheimer;s and Vascular Dementia   Follow-up in 6 months Continue donepezil  10 mg daily and memantine  10 mg twice daily, side effects discussed Recommend good control of his cardiovascular risk factors Continue to control mood as per PCP Continue [facility] at [facility]  Prior small acute infarct in the right medial brain likely due to SVD (2024)  CT of the head negative for acute intracranial abnormalities, with generalized atrophy and chronic microhemorrhages, CT angio of the head and neck, negative for LVO, chronically severe stenosis of the left PCA P1-P2, MRI of the brain shows small acute infarct within the right medial brain within great matter, without hemorrhage or mass effect, innumerable chronic microhemorrhages concerning for cerebral amyloid angiopathy. 2D echo EF 60 to 65%, mild LVEF, LDL 35, A1c 7.2.He was on Plavix  prior to admission, this was changed to Brilinta  90 mg twice daily for 4 weeks, currently on baby aspirin  daily . Continue secondary stroke prevention, continue baby aspirin  daily Continue to monitor cardiovascular risk factors   History of Present Illness   Discussed  the use of AI scribe software for clinical note transcription with the patient, who gave verbal consent to proceed.     Any changes in memory since last visit? About the same. He is still good at dates, birthdays. LTM is good.  repeats oneself?  Endorsed Disoriented when walking into a room? Denies    Leaving objects?  May misplace things but not in unusual places   Wandering behavior?  denies   Any personality changes since last visit?  Denies.   Any worsening depression?:  Denies.   Hallucinations or paranoia?  Denies.   Seizures? denies    Any sleep changes? Sleeps well and naps during the day.  Denies vivid dreams, REM behavior or sleepwalking   Sleep apnea?   Denies.   Any hygiene concerns? Denies.  Independent of bathing and dressing?  Endorsed  Does the patient needs help with medications?  Son is in charge   Who is in charge of the finances? Son and patient's wife  in charge     Any changes in appetite?  Trying to eat healthier, does not eat as much.    Patient have trouble swallowing? Denies.   Does the patient cook? No Any headaches?   denies   Any vision changes? Denies.  Chronic back pain  denies   Ambulates with difficulty? Denies.    Recent falls or head injuries? Denies.     Any new strokelike symptoms? denies   Any tremors?  Only when he tries to focus on something    Any anosmia?  Denies   Any incontinence of urine?  Endorsed, wears diapers at night  Any bowel dysfunction?   Denies      Patient lives  wife and son Does the patient drive? No longer drives     MRI of the brain 04/30/2023, personally reviewed without acute intracranial abnormalities, numerous chronic microhemorrhages mostly in the peripheral distribution, likely cerebral amyloid angiopathy, as well as chronic small vessel ischemia and volume loss.   CT angio of the head and neck, negative for LVO, chronically severe stenosis of the left PCA P1-P2,    MRI of the brain shows small acute infarct  within the right medial brain within great matter, without hemorrhage or mass effect, innumerable chronic microhemorrhages concerning for cerebral amyloid angiopathy.        01/31/2023   12:00 PM  MMSE - Mini Mental State Exam  Orientation to time 3  Orientation to Place 2  Registration 3  Attention/ Calculation 4  Recall 3  Language- name 2 objects 2  Language- repeat 1  Language- follow 3 step command 3  Language- read & follow direction 1  Write a sentence 1  Copy design 0  Total score 23       No data to display            Objective:    Neurological Exam:    VITALS:   Vitals:   02/06/24 1311  BP: (!) 140/83  Pulse: 75  Resp: 20  SpO2: 95%  Height: 5' 6 (1.676 m)    GEN:  The patient appears stated age and is in NAD. HEENT:  Normocephalic, atraumatic.   Neurological examination:  General: NAD, well-groomed, appears stated age. Orientation: The patient is alert. Oriented to person, place and date Cranial nerves: There is good facial symmetry. The speech dysarthric, has mild hearing aphasia.  Fund of knowledge is appropriate. Recent and remote memory are impaired. Attention and concentration are reduced. Able to name objects and repeat phrases.  Hearing is intact to conversational tone.   Sensation: Sensation is intact to light touch throughout Motor: Strength is 4/5 on the LLE, and LUE on the right is normal 5 out of 5 DTR's 2/4 in UE/LE     Movement examination:  Tone: There is normal tone in the UE/LE Abnormal movements:  no tremor.  No myoclonus.  No asterixis.   Coordination:  There is no decremation with RAM's. Normal finger to nose  Gait and Station: The patient has  difficulty arising out of a deep-seated chair without the use of the hands, drags the left foot, unable to test gait, stride slow, short.     Thank you for allowing us  the opportunity to participate in the care of this nice patient. Please do not hesitate to contact us  for any  questions or concerns.   Total time spent on today's visit was *** minutes dedicated to this patient today, preparing to see patient, examining the patient, ordering tests and/or medications and counseling the patient, documenting clinical information in the EHR or other health record, independently interpreting results and communicating results to the patient/family, discussing treatment and goals, answering patient's questions and coordinating care.  Cc:  Cloria Annabella CROME, DO  [facility] 02/06/2024 2:05 PM

## 2024-02-06 ENCOUNTER — Encounter: Payer: Self-pay | Admitting: Physician Assistant

## 2024-02-06 ENCOUNTER — Ambulatory Visit: Payer: Medicare (Managed Care) | Admitting: Physician Assistant

## 2024-02-06 VITALS — BP 140/83 | HR 75 | Resp 20 | Ht 66.0 in

## 2024-02-06 DIAGNOSIS — G309 Alzheimer's disease, unspecified: Secondary | ICD-10-CM

## 2024-02-06 DIAGNOSIS — F0280 Dementia in other diseases classified elsewhere without behavioral disturbance: Secondary | ICD-10-CM | POA: Diagnosis not present

## 2024-02-06 DIAGNOSIS — F0150 Vascular dementia without behavioral disturbance: Secondary | ICD-10-CM | POA: Diagnosis not present

## 2024-02-06 NOTE — Patient Instructions (Signed)
 Continue memantine  10mg   twice daily and donepezil  10mg  at bedtime Continue  blood thinner and your cholesterol and BP medications  and diabetes medications   Follow up in  7 months.

## 2024-02-19 ENCOUNTER — Other Ambulatory Visit

## 2024-03-19 ENCOUNTER — Other Ambulatory Visit

## 2024-03-30 ENCOUNTER — Other Ambulatory Visit

## 2024-09-07 ENCOUNTER — Ambulatory Visit: Payer: Self-pay | Admitting: Physician Assistant
